# Patient Record
Sex: Male | Born: 1950
Health system: Southern US, Community
[De-identification: ages and names within clinical notes are randomized; demographics above are authoritative.]

## PROBLEM LIST (undated history)

## (undated) DIAGNOSIS — E349 Endocrine disorder, unspecified: Secondary | ICD-10-CM

## (undated) DIAGNOSIS — E669 Obesity, unspecified: Secondary | ICD-10-CM

## (undated) DIAGNOSIS — E291 Testicular hypofunction: Secondary | ICD-10-CM

## (undated) DIAGNOSIS — R7303 Prediabetes: Secondary | ICD-10-CM

## (undated) DIAGNOSIS — I517 Cardiomegaly: Secondary | ICD-10-CM

## (undated) DIAGNOSIS — I10 Essential (primary) hypertension: Secondary | ICD-10-CM

## (undated) DIAGNOSIS — K8020 Calculus of gallbladder without cholecystitis without obstruction: Secondary | ICD-10-CM

## (undated) DIAGNOSIS — E559 Vitamin D deficiency, unspecified: Secondary | ICD-10-CM

## (undated) DIAGNOSIS — E782 Mixed hyperlipidemia: Secondary | ICD-10-CM

## (undated) DIAGNOSIS — E079 Disorder of thyroid, unspecified: Secondary | ICD-10-CM

## (undated) DIAGNOSIS — K760 Fatty (change of) liver, not elsewhere classified: Secondary | ICD-10-CM

## (undated) DIAGNOSIS — K635 Polyp of colon: Secondary | ICD-10-CM

## (undated) DIAGNOSIS — K8689 Other specified diseases of pancreas: Secondary | ICD-10-CM

## (undated) DIAGNOSIS — F32A Depression, unspecified: Secondary | ICD-10-CM

## (undated) DIAGNOSIS — G4730 Sleep apnea, unspecified: Secondary | ICD-10-CM

## (undated) DIAGNOSIS — F329 Major depressive disorder, single episode, unspecified: Secondary | ICD-10-CM

## (undated) HISTORY — DX: Essential (primary) hypertension: I10

## (undated) HISTORY — DX: Sleep apnea, unspecified: G47.30

## (undated) HISTORY — DX: Mixed hyperlipidemia: E78.2

## (undated) HISTORY — DX: Cardiomegaly: I51.7

## (undated) HISTORY — DX: Depression, unspecified: F32.A

## (undated) HISTORY — DX: Disorder of thyroid, unspecified: E07.9

## (undated) HISTORY — DX: Testicular hypofunction: E29.1

## (undated) HISTORY — PX: COLONOSCOPY: SHX174

## (undated) HISTORY — DX: Fatty (change of) liver, not elsewhere classified: K76.0

## (undated) HISTORY — DX: Calculus of gallbladder without cholecystitis without obstruction: K80.20

## (undated) HISTORY — DX: Polyp of colon: K63.5

## (undated) HISTORY — DX: Vitamin D deficiency, unspecified: E55.9

## (undated) HISTORY — DX: Obesity, unspecified: E66.9

## (undated) HISTORY — PX: POLYPECTOMY: SHX149

## (undated) HISTORY — DX: Other specified diseases of pancreas: K86.89

## (undated) HISTORY — DX: Endocrine disorder, unspecified: E34.9

## (undated) HISTORY — DX: Major depressive disorder, single episode, unspecified: F32.9

## (undated) HISTORY — DX: Prediabetes: R73.03

---
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
## 1974-05-27 HISTORY — PX: CHOLECYSTECTOMY: SHX55

## 1984-05-27 HISTORY — PX: ANKLE FRACTURE SURGERY: SHX122

## 1994-05-27 HISTORY — PX: CARPAL TUNNEL RELEASE: SHX101

## 1998-04-06 ENCOUNTER — Ambulatory Visit (HOSPITAL_BASED_OUTPATIENT_CLINIC_OR_DEPARTMENT_OTHER): Admission: RE | Admit: 1998-04-06 | Discharge: 1998-04-06 | Payer: Self-pay | Admitting: Orthopedic Surgery

## 1998-04-14 ENCOUNTER — Other Ambulatory Visit: Admission: RE | Admit: 1998-04-14 | Discharge: 1998-04-14 | Payer: Self-pay | Admitting: Urology

## 1998-09-14 ENCOUNTER — Other Ambulatory Visit: Admission: RE | Admit: 1998-09-14 | Discharge: 1998-09-14 | Payer: Self-pay | Admitting: Urology

## 1999-04-06 ENCOUNTER — Ambulatory Visit (HOSPITAL_COMMUNITY): Admission: RE | Admit: 1999-04-06 | Discharge: 1999-04-06 | Payer: Self-pay | Admitting: *Deleted

## 1999-07-27 ENCOUNTER — Ambulatory Visit (HOSPITAL_BASED_OUTPATIENT_CLINIC_OR_DEPARTMENT_OTHER): Admission: RE | Admit: 1999-07-27 | Discharge: 1999-07-27 | Payer: Self-pay | Admitting: General Surgery

## 1999-07-27 ENCOUNTER — Encounter (INDEPENDENT_AMBULATORY_CARE_PROVIDER_SITE_OTHER): Payer: Self-pay

## 2000-01-08 ENCOUNTER — Ambulatory Visit (HOSPITAL_BASED_OUTPATIENT_CLINIC_OR_DEPARTMENT_OTHER): Admission: RE | Admit: 2000-01-08 | Discharge: 2000-01-08 | Payer: Self-pay | Admitting: Orthopedic Surgery

## 2000-02-20 ENCOUNTER — Ambulatory Visit (HOSPITAL_COMMUNITY): Admission: RE | Admit: 2000-02-20 | Discharge: 2000-02-20 | Payer: Self-pay | Admitting: *Deleted

## 2002-06-14 ENCOUNTER — Ambulatory Visit (HOSPITAL_COMMUNITY): Admission: RE | Admit: 2002-06-14 | Discharge: 2002-06-14 | Payer: Self-pay | Admitting: Internal Medicine

## 2002-06-14 ENCOUNTER — Encounter: Payer: Self-pay | Admitting: Internal Medicine

## 2003-06-14 ENCOUNTER — Ambulatory Visit (HOSPITAL_COMMUNITY): Admission: RE | Admit: 2003-06-14 | Discharge: 2003-06-14 | Payer: Self-pay | Admitting: Internal Medicine

## 2004-09-24 ENCOUNTER — Ambulatory Visit: Payer: Self-pay | Admitting: Internal Medicine

## 2005-07-02 ENCOUNTER — Ambulatory Visit: Payer: Self-pay | Admitting: Internal Medicine

## 2005-07-15 ENCOUNTER — Ambulatory Visit: Payer: Self-pay | Admitting: Internal Medicine

## 2005-07-15 ENCOUNTER — Encounter (INDEPENDENT_AMBULATORY_CARE_PROVIDER_SITE_OTHER): Payer: Self-pay | Admitting: *Deleted

## 2006-07-15 ENCOUNTER — Encounter: Admission: RE | Admit: 2006-07-15 | Discharge: 2006-10-13 | Payer: Self-pay | Admitting: Internal Medicine

## 2007-04-10 ENCOUNTER — Ambulatory Visit: Payer: Self-pay | Admitting: Internal Medicine

## 2009-06-12 ENCOUNTER — Encounter: Payer: Self-pay | Admitting: Cardiovascular Disease

## 2009-07-13 ENCOUNTER — Ambulatory Visit (HOSPITAL_COMMUNITY): Admission: RE | Admit: 2009-07-13 | Discharge: 2009-07-13 | Payer: Self-pay | Admitting: Internal Medicine

## 2009-07-13 ENCOUNTER — Encounter: Payer: Self-pay | Admitting: Cardiovascular Disease

## 2009-07-28 ENCOUNTER — Encounter: Payer: Self-pay | Admitting: Cardiovascular Disease

## 2009-08-01 DIAGNOSIS — I10 Essential (primary) hypertension: Secondary | ICD-10-CM | POA: Insufficient documentation

## 2009-08-01 DIAGNOSIS — G4733 Obstructive sleep apnea (adult) (pediatric): Secondary | ICD-10-CM

## 2009-08-03 DIAGNOSIS — E349 Endocrine disorder, unspecified: Secondary | ICD-10-CM

## 2009-08-03 HISTORY — DX: Endocrine disorder, unspecified: E34.9

## 2009-08-04 ENCOUNTER — Ambulatory Visit: Payer: Self-pay | Admitting: Cardiovascular Disease

## 2009-08-04 DIAGNOSIS — E782 Mixed hyperlipidemia: Secondary | ICD-10-CM

## 2009-08-21 ENCOUNTER — Inpatient Hospital Stay (HOSPITAL_COMMUNITY): Admission: EM | Admit: 2009-08-21 | Discharge: 2009-08-27 | Payer: Self-pay | Admitting: Emergency Medicine

## 2009-08-21 ENCOUNTER — Ambulatory Visit: Payer: Self-pay | Admitting: Cardiology

## 2009-08-21 ENCOUNTER — Ambulatory Visit: Payer: Self-pay | Admitting: Oncology

## 2009-08-22 ENCOUNTER — Encounter (INDEPENDENT_AMBULATORY_CARE_PROVIDER_SITE_OTHER): Payer: Self-pay | Admitting: Internal Medicine

## 2010-06-22 ENCOUNTER — Encounter: Payer: Self-pay | Admitting: Internal Medicine

## 2010-06-26 NOTE — Letter (Signed)
Summary: [facility] Adult & Adolescent Medicine Office Note  [facility] Adult & Adolescent Medicine Office Note   Imported By: Roderic Ovens 08/28/2009 11:57:02  _____________________________________________________________________  External Attachment:    Type:   Image     Comment:   External Document

## 2010-06-26 NOTE — Assessment & Plan Note (Signed)
Summary: np3/abn chest xray/mild LVF/jml   History of Present Illness: [name] [name] is seen today at the request of Dr Kathe Becton.  He has CRF of positive family history, HTN and elevated lipids  His last LDL was 64 and his BP has been under good contol.  He had a routine physical and CXR suggested cardiomegaly I reviewed it and thought heart size was within normal range with no other thoracic abnormalities.  He denies dyspnea, SSCP, palpitations or edema.  He has been compliant with his meds.  He had a normal myovue in 2005.  He enjoys riding his [employer] but is otherwise sedentary  Current Problems (verified): 1)  Hypertension  (ICD-401.9) 2)  Hypogonadism  (ICD-257.2) 3)  Sleep Apnea  (ICD-780.57) 4)  Obesity  (ICD-278.00)  Current Medications (verified): 1)  Enalapril Maleate 20 Mg Tabs (Enalapril Maleate) .Marland Kitchen.. 1 Tab By Mouth Once Daily 2)  Sertraline Hcl 100 Mg Tabs (Sertraline Hcl) .Marland Kitchen.. 1 Tab By Mouth Once Daily 3)  Crestor 5 Mg Tabs (Rosuvastatin Calcium) .... Mon, Wed, Fri 1 Tab 4)  Hydrochlorothiazide 25 Mg Tabs (Hydrochlorothiazide) .... Take One Tablet By Mouth Daily. 5)  Testosterone Shot .... Every 2 Weeks 6)  Multivitamins   Tabs (Multiple Vitamin) .Marland Kitchen.. 1 Tab By Mouth Once Daily 7)  Vitamin Dtabs (Cholecalciferol) .Marland Kitchen.. 1 Tab By Mouth Once Daily 8)  Aspirin 81 Mg  Tabs (Aspirin) .Marland Kitchen.. 1 Tab By Mouth Once Daily 9)  Niacin Cr 1500 Mg Cr-Caps (Niacin) .... Daily 10)  Fish Oil   Oil (Fish Oil) .Marland Kitchen.. 1 Tab By Mouth Once Daily 11)  Vitamin C 1000 Mg Tabs (Ascorbic Acid) .Marland Kitchen.. 1 Tab By Mouth  Once Daily  Allergies (verified): No Known Drug Allergies  Past History:  Past Medical History: Last updated: 08/01/2009 Current Problems:  HYPERTENSION (ICD-401.9) HYPOGONADISM (ICD-257.2) SLEEP APNEA (ICD-780.57) OBESITY (ICD-278.00)  Past Surgical History: Last updated: 08/01/2009   Release A1 pulley left ring finger.  Carpal tunnel release left hand.  Family History: Last updated:  08/01/2009 noncontributory  Review of Systems       Denies fever, malais, weight loss, blurry vision, decreased visual acuity, cough, sputum, SOB, hemoptysis, pleuritic pain, palpitaitons, heartburn, abdominal pain, melena, lower extremity edema, claudication, or rash.   Vital Signs:  Patient profile:   60 year old male Height:      66 inches Weight:      227 pounds BMI:     36.77 Pulse rate:   78 / minute Resp:     12 per minute BP sitting:   131 / 83  (left arm)  Vitals Entered By: Kem Parkinson (August 04, 2009 9:01 AM)  Physical Exam  General:  Affect appropriate Healthy:  appears stated age HEENT: normal Neck supple with no adenopathy JVP normal no bruits no thyromegaly Lungs clear with no wheezing and good diaphragmatic motion Heart:  S1/S2 no murmur,rub, gallop or click PMI normal Abdomen: benighn, BS positve, no tenderness, no AAA no bruit.  No HSM or HJR Distal pulses intact with no bruits No edema Neuro non-focal Skin warm and dry    Impression & Recommendations:  Problem # 1:  CARDIOMEGALY (ICD-429.3) Borderline by CXR.  F/U ech to assess LV size and LVH  Problem # 2:  MIXED HYPERLIPIDEMIA (ICD-272.2) Continue statin labs per Dr Steward Drone His updated medication list for this problem includes:    Crestor 5 Mg Tabs (Rosuvastatin calcium) ..... Mon, wed, fri 1 tab  Problem # 3:  HYPERTENSION (ICD-401.9) Well controlled  continue low sodium diet His updated medication list for this problem includes:    Enalapril Maleate 20 Mg Tabs (Enalapril maleate) .Marland Kitchen... 1 tab by mouth once daily    Hydrochlorothiazide 25 Mg Tabs (Hydrochlorothiazide) .Marland Kitchen... Take one tablet by mouth daily.    Aspirin 81 Mg Tabs (Aspirin) .Marland Kitchen... 1 tab by mouth once daily  Orders: Echocardiogram (Echo)  Patient Instructions: 1)  Your physician recommends that you schedule a follow-up appointment in: AS NEEDED PENDING TEST RESULTS 2)  Your physician has requested that you have an  echocardiogram.  Echocardiography is a painless test that uses sound waves to create images of your heart. It provides your doctor with information about the size and shape of your heart and how well your heart's chambers and valves are working.  This procedure takes approximately one hour. There are no restrictions for this procedure.   EKG Report  Procedure date:  07/13/2009  Findings:      NSR 63 Normal ECG No LVH

## 2010-06-26 NOTE — Letter (Signed)
Summary: [place] [facility] Ad[facility]se[facility] Office Note  [facility] Adult & Adolsecent Medicine Office Note   Imported By: Roderic Ovens 08/28/2009 11:56:14  _____________________________________________________________________  External Attachment:    Type:   Image     Comment:   External Document

## 2010-06-26 NOTE — Consult Note (Signed)
Summary: [place] [facility]  [facility]   Imported By: Earl Many 08/03/2009 17:47:39  _____________________________________________________________________  External Attachment:    Type:   Image     Comment:   External Document

## 2010-06-26 NOTE — Letter (Signed)
Summary: [place] [facility]  [facility]   Imported By: Earl Many 08/03/2009 17:49:30  _____________________________________________________________________  External Attachment:    Type:   Image     Comment:   External Document

## 2010-06-28 NOTE — Letter (Signed)
Summary: Colonoscopy Letter  [facility]  [address] [place], [place] [address]   Phone: [phone]  Fax: [phone]      June 22, 2010 MRN: [id number]   [name] [name] [address] [place], [place]  [address]   Dear [name] [name],   According to your medical record, it is time for you to schedule a Colonoscopy. The American Cancer Society recommends this procedure as a method to detect early colon cancer. Patients with a family history of colon cancer, or a personal history of colon polyps or inflammatory bowel disease are at increased risk.  This letter has been generated based on the recommendations made at the time of your procedure. If you feel that in your particular situation this may no longer apply, please contact our office.  Please call our office at [phone] to schedule this appointment or to update your records at your earliest convenience.  Thank you for cooperating with [place] to provide you with the very best care possible.   Sincerely,  Wilhemina Bonito. Marina Goodell, M.D.  [facility] [phone]

## 2010-07-26 ENCOUNTER — Encounter (INDEPENDENT_AMBULATORY_CARE_PROVIDER_SITE_OTHER): Payer: Self-pay | Admitting: *Deleted

## 2010-07-28 ENCOUNTER — Inpatient Hospital Stay (HOSPITAL_COMMUNITY)
Admission: EM | Admit: 2010-07-28 | Discharge: 2010-07-31 | DRG: 174 | Disposition: A | Discharge status: Home / Self Care | Payer: BC Managed Care – PPO | Attending: Internal Medicine | Admitting: Internal Medicine

## 2010-07-28 DIAGNOSIS — F3289 Other specified depressive episodes: Secondary | ICD-10-CM | POA: Diagnosis present

## 2010-07-28 DIAGNOSIS — D62 Acute posthemorrhagic anemia: Secondary | ICD-10-CM | POA: Diagnosis present

## 2010-07-28 DIAGNOSIS — E669 Obesity, unspecified: Secondary | ICD-10-CM | POA: Diagnosis present

## 2010-07-28 DIAGNOSIS — E785 Hyperlipidemia, unspecified: Secondary | ICD-10-CM | POA: Diagnosis present

## 2010-07-28 DIAGNOSIS — Z79899 Other long term (current) drug therapy: Secondary | ICD-10-CM

## 2010-07-28 DIAGNOSIS — G4733 Obstructive sleep apnea (adult) (pediatric): Secondary | ICD-10-CM | POA: Diagnosis present

## 2010-07-28 DIAGNOSIS — K5731 Diverticulosis of large intestine without perforation or abscess with bleeding: Principal | ICD-10-CM | POA: Diagnosis present

## 2010-07-28 DIAGNOSIS — Z22322 Carrier or suspected carrier of Methicillin resistant Staphylococcus aureus: Secondary | ICD-10-CM

## 2010-07-28 DIAGNOSIS — I4891 Unspecified atrial fibrillation: Secondary | ICD-10-CM | POA: Diagnosis present

## 2010-07-28 DIAGNOSIS — I10 Essential (primary) hypertension: Secondary | ICD-10-CM | POA: Diagnosis present

## 2010-07-28 DIAGNOSIS — Z7982 Long term (current) use of aspirin: Secondary | ICD-10-CM

## 2010-07-28 DIAGNOSIS — F329 Major depressive disorder, single episode, unspecified: Secondary | ICD-10-CM | POA: Diagnosis present

## 2010-07-28 LAB — DIFFERENTIAL
Eosinophils Absolute: 0.2 10*3/uL (ref 0.0–0.7)
Lymphocytes Relative: 12 % (ref 12–46)
Lymphs Abs: 1.5 10*3/uL (ref 0.7–4.0)
Neutrophils Relative %: 81 % — ABNORMAL HIGH (ref 43–77)

## 2010-07-28 LAB — CBC
HCT: 43.3 % (ref 39.0–52.0)
MCH: 30.9 pg (ref 26.0–34.0)
Platelets: 215 10*3/uL (ref 150–400)
WBC: 12.4 10*3/uL — ABNORMAL HIGH (ref 4.0–10.5)

## 2010-07-28 LAB — POCT I-STAT, CHEM 8
Chloride: 105 mEq/L (ref 96–112)
Glucose, Bld: 144 mg/dL — ABNORMAL HIGH (ref 70–99)
Hemoglobin: 15.3 g/dL (ref 13.0–17.0)
Sodium: 140 mEq/L (ref 135–145)
TCO2: 23 mmol/L (ref 0–100)

## 2010-07-28 LAB — PROTIME-INR
INR: 1.04 (ref 0.00–1.49)
Prothrombin Time: 13.8 seconds (ref 11.6–15.2)

## 2010-07-28 LAB — HEPATIC FUNCTION PANEL
ALT: 40 U/L (ref 0–53)
AST: 32 U/L (ref 0–37)
Albumin: 3.6 g/dL (ref 3.5–5.2)
Alkaline Phosphatase: 36 U/L — ABNORMAL LOW (ref 39–117)
Bilirubin, Direct: 0.1 mg/dL (ref 0.0–0.3)
Indirect Bilirubin: 0.2 mg/dL — ABNORMAL LOW (ref 0.3–0.9)

## 2010-07-28 LAB — URINALYSIS, ROUTINE W REFLEX MICROSCOPIC
Hgb urine dipstick: NEGATIVE
Ketones, ur: 15 mg/dL — AB
Nitrite: NEGATIVE
Protein, ur: NEGATIVE mg/dL
pH: 6 (ref 5.0–8.0)

## 2010-07-28 LAB — TYPE AND SCREEN

## 2010-07-28 LAB — APTT: aPTT: 25 seconds (ref 24–37)

## 2010-07-29 ENCOUNTER — Emergency Department (HOSPITAL_COMMUNITY): Payer: BC Managed Care – PPO

## 2010-07-29 DIAGNOSIS — K922 Gastrointestinal hemorrhage, unspecified: Secondary | ICD-10-CM

## 2010-07-29 DIAGNOSIS — K5730 Diverticulosis of large intestine without perforation or abscess without bleeding: Secondary | ICD-10-CM

## 2010-07-29 DIAGNOSIS — D62 Acute posthemorrhagic anemia: Secondary | ICD-10-CM

## 2010-07-29 DIAGNOSIS — I369 Nonrheumatic tricuspid valve disorder, unspecified: Secondary | ICD-10-CM

## 2010-07-29 LAB — COMPREHENSIVE METABOLIC PANEL
ALT: 32 U/L (ref 0–53)
Albumin: 3.5 g/dL (ref 3.5–5.2)
Alkaline Phosphatase: 29 U/L — ABNORMAL LOW (ref 39–117)
BUN: 14 mg/dL (ref 6–23)
Chloride: 109 mEq/L (ref 96–112)
Glucose, Bld: 119 mg/dL — ABNORMAL HIGH (ref 70–99)
Potassium: 4 mEq/L (ref 3.5–5.1)
Sodium: 145 mEq/L (ref 135–145)
Total Bilirubin: 0.6 mg/dL (ref 0.3–1.2)
Total Protein: 5.6 g/dL — ABNORMAL LOW (ref 6.0–8.3)

## 2010-07-29 LAB — DIFFERENTIAL
Basophils Relative: 0 % (ref 0–1)
Monocytes Absolute: 0.6 10*3/uL (ref 0.1–1.0)

## 2010-07-29 LAB — CK TOTAL AND CKMB (NOT AT ARMC)
Relative Index: 3.4 — ABNORMAL HIGH (ref 0.0–2.5)
Total CK: 253 U/L — ABNORMAL HIGH (ref 7–232)

## 2010-07-29 LAB — MAGNESIUM: Magnesium: 1.8 mg/dL (ref 1.5–2.5)

## 2010-07-29 LAB — PROTIME-INR
INR: 1.17 (ref 0.00–1.49)
Prothrombin Time: 15.1 seconds (ref 11.6–15.2)

## 2010-07-29 LAB — APTT: aPTT: 27 seconds (ref 24–37)

## 2010-07-29 LAB — CBC
HCT: 37.2 % — ABNORMAL LOW (ref 39.0–52.0)
Hemoglobin: 12.9 g/dL — ABNORMAL LOW (ref 13.0–17.0)
MCH: 30.7 pg (ref 26.0–34.0)
MCH: 29.8 pg (ref 26.0–34.0)
MCHC: 34.7 g/dL (ref 30.0–36.0)
MCV: 88.6 fL (ref 78.0–100.0)
MCV: 89.3 fL (ref 78.0–100.0)
Platelets: 185 10*3/uL (ref 150–400)
RBC: 3.82 MIL/uL — ABNORMAL LOW (ref 4.22–5.81)
RDW: 14.4 % (ref 11.5–15.5)
RDW: 14.5 % (ref 11.5–15.5)

## 2010-07-29 LAB — TROPONIN I: Troponin I: 0.03 ng/mL (ref 0.00–0.06)

## 2010-07-29 LAB — PHOSPHORUS: Phosphorus: 2.3 mg/dL (ref 2.3–4.6)

## 2010-07-29 LAB — ABO/RH: ABO/RH(D): A POS

## 2010-07-29 LAB — CARDIAC PANEL(CRET KIN+CKTOT+MB+TROPI)
CK, MB: 6.8 ng/mL (ref 0.3–4.0)
Relative Index: 2.7 — ABNORMAL HIGH (ref 0.0–2.5)
Total CK: 257 U/L — ABNORMAL HIGH (ref 7–232)
Total CK: 248 U/L — ABNORMAL HIGH (ref 7–232)
Troponin I: 0.02 ng/mL (ref 0.00–0.06)

## 2010-07-29 NOTE — H&P (Signed)
[name] [name], [name]:  [id number]  [name]:  [id number]           PATIENT TYPE:  E  LOCATION:  [facility]                         FACILITY:  [facility]  PHYSICIAN:  [name], MDDATE OF BIRTH:  March 06, 1951  DATE OF ADMISSION:  07/28/2010 DATE OF DISCHARGE:                             HISTORY & PHYSICAL   PRIMARY CARE PROVIDER:  [name], DO  CHIEF COMPLAINT:  Bloody bowel movements.  The patient is a 60 year old gentleman with past medical history of depression, hypertension, and diverticulosis.  The patient's last colonoscopy was 5 years ago.  He was supposed to have a colonoscopy done by [name] [name] in April.  The patient has been having recurrent bloody bowel movements all day today starting around 6:00 a.m.  At first, they were cherry blood red and then became more maroon.  Last bowel movement was about 3 hours ago. While he was having a bowel movement, he passed out and hit his head onthe wall and was brought into emergency department.  Since he had been in the ER, he has not had any more bowel movements.  He denies any chest pains or shortness of breath.  He has been otherwise doing well.  When he sits up or makes of a sudden movement, he does feel slightly lightheaded.  His hemoglobin has been stable and he may have been hemodynamically stable while in the emergency department.  He denies any nausea or vomiting.  He states that he has no abdominal discomfort of any sort.  No chest pain.  No shortness of breath.  No fevers.  No chills.  Otherwise, review of systems are negative.  PAST MEDICAL HISTORY:  Significant for: 1. Depression. 2. Hypercholesteremia. 3. Hypertension. 4. Small bowel obstruction. 5. Paroxysmal atrial fibrillation. 6. Diverticulosis. 7. Obstructive sleep apnea, on CPAP. 8. History of erythrocytosis in the past. 9. Dyslipidemia. 10.Obesity.  SOCIAL HISTORY:  The patient has not smoked for the past 35  years.  Does not drink or abuse drugs.  FAMILY HISTORY:  Significant for early onset coronary artery disease with his father dying from massive heart attack at age of 81.  ALLERGIES:  None.  MEDICATIONS: 1. Aspirin 325 mg daily. 2. Crestor 5 mg daily on Monday, Wednesday and Friday. 3. Metoprolol 25 mg twice a day. 4. Enalapril 20 mg daily. 5. Fish oil 1200 twice daily. 6. Multivitamins with vitamin D. 7. Sertraline 100 mg daily. 8. Vitamin B6, 50 mg over-the-counter. 9. Vitamin D 1000 units 6 tablets daily. 10.Vitamin C 500 mg 2 tablets daily.  PHYSICAL EXAMINATION:  VITAL SIGNS:  Temperature 98.6, blood pressure 102/79 up to 128/81, pulse 106, respirations 18, and saturating 97% room air. GENERAL:  The patient appears to be currently in no acute distress. HEENT:  Head showing ecchymosis over left eye. HEART:  Regular rate and rhythm.  No murmurs appreciated. LUNGS:  Clear to auscultation bilaterally. ABDOMEN:  Soft, nontender, and nondistended.  Good bowel movement throughout. EXTREMITIES:  Lower extremities without clubbing, cyanosis, or edema. NEUROLOGIC:  Grossly intact. SKIN:  Clean, dry, and intact.  LABORATORY DATA:  White blood  cell count 12.4, hemoglobin 15.3.  Sodium 140, potassium 4.1, creatinine 1.0.  UA unremarkable.  INR 1.04.  I do not see an EKG on the chart.  RADIOLOGICAL STUDIES:  He has not had any.  ASSESSMENT AND PLAN:  This is a 61 year old gentleman. 1. Gastrointestinal bleed.  This is likely lower gastrointestinal     bleed.  We will admit to Step-Down for observation.  We will do     serial CBC.  Discussed with Dr. Leone Payor who will see him in a.m.     We will put him on Protonix, give aggressive IV resuscitation,     watch vitals carefully. 2. Syncope.  Possibly vasovagal syncope, although given his past     history of hypertension and hypercholesteremia, we will cycle     cardiac enzymes.  Check 2-D echo and obtain EKG just to be on      complete safe side. 3. Head injury.  We will obtain CT scan of the head to rule out bleed     or occult fracture. 4. Prophylaxis.  Protonix and SCDs. 5. Hypertension.  We will only continue metoprolol low dose. 6. Paroxysmal atrial fibrillation, for which he was on full-dose     aspirin.  For now, we will hold and continue metoprolol.  Right     now, he is in sinus rhythm.     [name], MD    AVD/MEDQ  D:  07/29/2010  T:  07/29/2010  Job:  [id number]  cc:   [name] [name], D.O.  Electronically Signed by [name] Doyne MD on 07/29/2010 10:13:00 PM

## 2010-07-30 ENCOUNTER — Encounter: Payer: Self-pay | Admitting: Gastroenterology

## 2010-07-30 LAB — CBC
MCHC: 33.8 g/dL (ref 30.0–36.0)
Platelets: 173 10*3/uL (ref 150–400)
RDW: 14.5 % (ref 11.5–15.5)
WBC: 5.5 10*3/uL (ref 4.0–10.5)

## 2010-07-31 DIAGNOSIS — D62 Acute posthemorrhagic anemia: Secondary | ICD-10-CM

## 2010-07-31 DIAGNOSIS — K5731 Diverticulosis of large intestine without perforation or abscess with bleeding: Secondary | ICD-10-CM

## 2010-07-31 LAB — RENAL FUNCTION PANEL
Albumin: 3.3 g/dL — ABNORMAL LOW (ref 3.5–5.2)
CO2: 31 mEq/L (ref 19–32)
Chloride: 106 mEq/L (ref 96–112)
Creatinine, Ser: 0.84 mg/dL (ref 0.4–1.5)
GFR calc Af Amer: >60 mL/min (ref >60)
GFR calc non Af Amer: >60 mL/min (ref >60)
Potassium: 4.2 mEq/L (ref 3.5–5.1)
Sodium: 140 mEq/L (ref 135–145)

## 2010-07-31 LAB — CBC
Hemoglobin: 11.2 g/dL — ABNORMAL LOW (ref 13.0–17.0)
MCH: 30.1 pg (ref 26.0–34.0)
Platelets: 173 10*3/uL (ref 150–400)
RBC: 3.72 MIL/uL — ABNORMAL LOW (ref 4.22–5.81)
WBC: 5 10*3/uL (ref 4.0–10.5)

## 2010-08-02 NOTE — Letter (Signed)
Summary: Pre Visit Letter Revised  [facility]  [address] [place], [place] [address]   Phone: [phone]  Fax: [phone]        07/26/2010 MRN: [id number] [name] [name] [address] [place], [place]  [address]             Procedure Date:  09/07/2010 @ 10:00   Recall colon-Dr. Marina Goodell   Welcome to the Gastroenterology Division at [facility].    You are scheduled to see a nurse for your pre-procedure visit on 08/17/2010 at 4:30 on the 3rd floor at [facility], [address]  We ask that you try to arrive at our office 15 minutes prior to your appointment time to allow for check-in.  Please take a minute to review the attached form.  If you answer "Yes" to one or more of the questions on the first page, we ask that you call the person listed at your earliest opportunity.  If you answer "No" to all of the questions, please complete the rest of the form and bring it to your appointment.    Your nurse visit will consist of discussing your medical and surgical history, your immediate family medical history, and your medications.   If you are unable to list all of your medications on the form, please bring the medication bottles to your appointment and we will list them.  We will need to be aware of both prescribed and over the counter drugs.  We will need to know exact dosage information as well.    Please be prepared to read and sign documents such as consent forms, a financial agreement, and acknowledgement forms.  If necessary, and with your consent, a friend or relative is welcome to sit-in on the nurse visit with you.  Please bring your insurance card so that we may make a copy of it.  If your insurance requires a referral to see a specialist, please bring your referral form from your primary care physician.  No co-pay is required for this nurse visit.     If you cannot keep your appointment, please call [phone] to cancel or reschedule prior  to your appointment date.  This allows [place] the opportunity to schedule an appointment for another patient in need of care.    Thank you for choosing [facility] for your medical needs.  We appreciate the opportunity to care for you.  Please visit [place] at our website  to learn more about our practice.  Sincerely, The Gastroenterology Division

## 2010-08-03 NOTE — Consult Note (Signed)
[name] [name], EDGELL NO.:  [id number]  [name]:  [id number]           PATIENT TYPE:  I  LOCATION:  2501                         FACILITY:  [facility]  PHYSICIAN:  Iva Boop, MD,FACGDATE OF BIRTH:  1951/01/20  DATE OF CONSULTATION:  07/29/2010 DATE OF DISCHARGE:                                CONSULTATION   REASON FOR CONSULTATION:  GI bleed.  REQUESTING PHYSICIAN:  [name], MD  PRIMARY CARE PHYSICIAN:  [name], DO  GASTROENTEROLOGIST.:  Wilhemina Bonito. Marina Goodell, MD  ASSESSMENT:  A 60 year old white man with an approximately 24-hour history of painless hematochezia.  Hemoglobin on presentation was 15, most recent was 12.9, continues with active bleeding, but seems to have slowed down with normally two episodes today and 5-6 yesterday.  Situation was complicated by syncope and head trauma with negative head CT.  He has periorbital ecchymosis.  There is a prior history of colon polyps, pathology shows hyperplastic polyp in 2007.  It sounds like he was preparing for a repeat colonoscopy with Dr. Marina Goodell, his primary gastroenterologist.  Risk factors for bleeding include aspirin use, and sertraline use.  RECOMMENDATIONS AND PLAN:  We will prep and proceed the colonoscopy, Dr. Melvia Heaps, will perform tomorrow.  I suspect given that he also has a history of diverticulosis, this is a GI bleed and colonic diverticulosis.  There has been no melena and his BUN is normal, I do not think there is any upper bleed.  Risks, benefits, and indications are explained.  He understands and agrees to proceed.  HISTORY:  A 60 year old white man with problems as outlined above.  He was in his usual state of health with no bowel habit changes or abdominal pain.  Yesterday, he developed onset of painless bright red blood per rectum passing moderate to large amounts throughout the day. He is waiting to see if it was stopped.  During one of the  episodes in the evening, he was on the toilet and he relatively suddenly passed out. He does not remember when he struck his head.  He was evaluated in the emergency room eventually, and found to have a left periorbital hematoma, he had sinus changes in the right maxillary sinus and air fluid level, left periorbital soft tissue swelling, but no other significant findings of the CT.  He was admitted to the floor where he has had two episodes of passage of blood today.  He has had no pain or other problems.  He denies antecedent bleeding, he denies dysphagia, heartburn, nausea, vomiting.  He had not been using any extra antiinflammatories, etc.  He uses an aspirin daily, though he stops that at times because he is a platelet donor.  PAST MEDICAL HISTORY: 1. Depression. 2. Sleep apnea on CPAP. 3. Dyslipidemia. 4. Hypertension. 5. Paroxysmal atrial fibrillation. 6. Diverticulosis. 7. Erythrocytosis. 8. Obesity. 9. Small-bowel obstruction, treated conservatively April 2011.  SURGICAL HISTORY: 1. Open cholecystectomy and appendectomy. 2. Exploratory laparotomy in the past. 3. Carpel tunnel release. 4. Left ankle surgery. 5. Ganglion cyst. 6. Angiolipoma, excision.  SOCIAL HISTORY:  The patient married.  He  stays with his wife.  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  He has not been a smoker for 35 years.  No alcohol or drug abuse.  FAMILY HISTORY:  No colon cancer, early coronary artery disease.  His father died in age 60 with myocardial infarction.  DRUG ALLERGIES:  None.  HOME MEDICATIONS: 1. Aspirin 325 mg daily. 2. Crestor 5 mg Monday, Wednesday, and Friday. 3. Metoprolol 25 mg b.i.d. 4. Enalapril 20 mg daily. 5. Fish oil 1200 mg twice daily. 6. Multivitamins and vitamin D. 7. Sertraline 100 mg daily. 8. Vitamin B6 50 mg over the counter. 9. Vitamin D 1000 units 6 tablets daily. 10.Vitamin C 500 mg daily.  REVIEW OF SYSTEMS:  Notable for the  periorbital hematoma.  He uses CPAP for sleep assistance with his sleep apnea.  He denies any chest pain or other respiratory difficulty.  Entire 10-point review of systems is otherwise negative.  PHYSICAL EXAMINATION:  GENERAL:  Reveals a well-developed, well- nourished obese white man in no acute distress. VITAL SIGNS:  Temperature 98, blood pressure 123/85, pulse 94 and regular, respirations 15. HEENT:  The eyes are anicteric.  He has a moderate periorbital hematoma on the left and the mouth and posterior pharynx are clear. NECK:  Supple without mass or thyromegaly. LUNGS:  Clear. HEART:  S1 and S2.  No rubs or gallops.  Regular rhythm and rate.  No jugular venous distention. ABDOMEN:  Obese, soft, and nontender with bowel sounds present, just slightly increased and multiple surgical scars in right upper quadrant and midline.  There is no inguinal adenopathy.  No supraclavicular or cervical adenopathy. EXTREMITIES:  The lower extremities are free of edema. NEUROLOGIC:  He is awake and alert and oriented x3.  Mood and affect are appropriate.  LABORATORY DATA:  Hemoglobin as mentioned above.  Troponin is negative. He has a mildly elevated CPK and CK-MB, phosphorous is 2.3, magnesium 1.8, glucose 119.  Electrolytes otherwise normal.  LFTs normal.  Coags normal.  His MRSA positive.  White count 9.2, platelets 193.  UA negative except for 15 mg/dL ketones.  EKG sinus rhythm.  Imaging as above with respect to his CT scan.     Iva Boop, MD,FACG     CEG/MEDQ  D:  07/29/2010  T:  07/30/2010  Job:  [id number]  cc:   [name], D.O.  Electronically Signed by Stan Head MDFACG on 08/03/2010 08:06:25 AM

## 2010-08-07 NOTE — Procedures (Signed)
Summary: Colonoscopy  Patient: Laurens Matheny Note: All result statuses are Final unless otherwise noted.  Tests: (1) Colonoscopy (COL)   COL Colonoscopy           DONE     [facility]     [address]     [place], [place]  [address]          COLONOSCOPY PROCEDURE REPORT          PATIENT:  Samy, Ryner  MR#:  [id number]     BIRTHDATE:  09/27/1950, 59 yrs. old  GENDER:  male     ENDOSCOPIST:  Barbette Hair. Arlyce Dice, MD     REF. BY:  Marisue Brooklyn, D.O.     PROCEDURE DATE:  07/30/2010     PROCEDURE:  Diagnostic Colonoscopy     ASA CLASS:  Class II     INDICATIONS:  hematochezia     MEDICATIONS:   Fentanyl 100 mcg IV, Versed 10 mg IV          DESCRIPTION OF PROCEDURE:   After the risks benefits and     alternatives of the procedure were thoroughly explained, informed     consent was obtained.  Digital rectal exam was performed and     revealed no abnormalities.   The EC-[id number] ([id number]) endoscope was     introduced through the anus and advanced to the cecum, which was     identified by both the appendix and ileocecal valve, without     limitations.  The quality of the prep was excellent, using Colyte.     The instrument was then slowly withdrawn as the colon was fully     examined.     <<PROCEDUREIMAGES>>          FINDINGS:  Moderate diverticulosis was found in the sigmoid colon.     Short cluster of moderate diverticula mid sigmoid. No fresh or old     blood (see image001).  This was otherwise a normal examination of     the colon (see image002, image003, image004, image005, image008,     and image011).   Retroflexed views in the rectum revealed no     abnormalities.    The scope was then withdrawn from the patient     and the procedure completed.          COMPLICATIONS:  None     ENDOSCOPIC IMPRESSION:     1) Moderate diverticulosis in the sigmoid colon     2) Otherwise normal examination          GI bleed secondary to diverticula       RECOMMENDATIONS:     1) Return to the care of your primary provider. GI follow up as     needed     REPEAT EXAM:  No          ______________________________     Barbette Hair. Arlyce Dice, MD          CC:          n.     eSIGNED:   Barbette Hair. Kaplan at 07/30/2010 02:26 PM          Joylene John, [phone]  Note: An exclamation mark (!) indicates a result that was not dispersed into the flowsheet. Document Creation Date: 07/30/2010 2:27 PM _______________________________________________________________________  (1) Order result status: Final Collection or observation date-time: 07/30/2010 14:21 Requested date-time:  Receipt date-time:  Reported date-time:  Referring Physician:   Ordering Physician: Molly Maduro  Arlyce Dice [phone]) Specimen Source:  Source: Launa Grill Order Number: [id number] Lab site:

## 2010-08-15 LAB — BASIC METABOLIC PANEL
BUN: 14 mg/dL (ref 6–23)
BUN: 5 mg/dL — ABNORMAL LOW (ref 6–23)
BUN: 8 mg/dL (ref 6–23)
CO2: 26 mEq/L (ref 19–32)
CO2: 26 mEq/L (ref 19–32)
Chloride: 104 mEq/L (ref 96–112)
Chloride: 108 mEq/L (ref 96–112)
Creatinine, Ser: 0.77 mg/dL (ref 0.4–1.5)
GFR calc Af Amer: >60 mL/min (ref >60)
GFR calc Af Amer: >60 mL/min (ref >60)
GFR calc non Af Amer: >60 mL/min (ref >60)
Potassium: 3.7 mEq/L (ref 3.5–5.1)
Potassium: 4.1 mEq/L (ref 3.5–5.1)

## 2010-08-15 LAB — CBC
HCT: 43.5 % (ref 39.0–52.0)
HCT: 45.1 % (ref 39.0–52.0)
MCHC: 34.1 g/dL (ref 30.0–36.0)
MCV: 93.4 fL (ref 78.0–100.0)
Platelets: 142 10*3/uL — ABNORMAL LOW (ref 150–400)
RBC: 4.65 MIL/uL (ref 4.22–5.81)
RBC: 4.82 MIL/uL (ref 4.22–5.81)
WBC: 4.9 10*3/uL (ref 4.0–10.5)
WBC: 6.7 10*3/uL (ref 4.0–10.5)

## 2010-08-16 NOTE — Discharge Summary (Signed)
NAMETALYN, DESSERT NO.:  [id number]  [name]:  [id number]           PATIENT TYPE:  I  LOCATION:  2501                         FACILITY:  [facility]  PHYSICIAN:  Marinda Elk, M.D.DATE OF BIRTH:  02/17/1951  DATE OF ADMISSION:  07/28/2010 DATE OF DISCHARGE:  07/31/2010                              DISCHARGE SUMMARY   ADMITTING PHYSICIAN:  Dr. Adela Glimpse with [facility].  DISCHARGING PHYSICIAN:  Dr.  Marinda Elk with [facility].  CONSULTANTS AT THIS ADMISSION:  Dr. Leone Payor with GI.  CHIEF COMPLAINT/REASON FOR ADMISSION:  Mr. Simkins is a pleasant 60 year old male patient, known history of depression, hypertension, and prior diverticulosis, who actually was scheduled to undergo screening colonoscopy in April of this year.  He presented to the hospital because of having bright red blood stools continuously since 6 a.m.  Midway to the day, the stools seemed to decrease in frequency, but later on they increased and what having a bowel movement.  He had an apparent syncopal episode and hit his head on the wall, sustaining a black eye on the left side.  In the ER, he was found to have a white cell count of 12,400, hemoglobin 15.3.  He had a relative hypotension with his blood pressure of 102/79.  He was tachycardic with a heart rate of 106, does not appear that orthostatic vital signs were checked in the ER.  Because of his symptomatology, the hospitalist were called to evaluate the patient for admission.  On their clinical exam, the patient was afebrile.  His blood pressure was now up to 128/81.  He was still tachycardic with heart rates in the 100s, respirations 18 and he was maintaining, O2 saturations of 97%.  His physical exam was unremarkable, except for an evolving black eye in the left periocular area, otherwise, again exam unremarkable.  Laboratory data again revealed slight leukocytosis with a hemoglobin of 15.3.  Sodium  140, potassium 5.1, creatinine 1.  INR 1.04. Urinalysis was within normal limits.  PAST MEDICAL HISTORY: 1. Depression. 2. Dyslipidemia. 3. Hypertension. 4. Prior small bowel obstruction. 5. Paroxysmal atrial fibrillation, maintaining sinus rhythm. 6. Diverticulosis. 7. Obstructive sleep apnea, on CPAP. 8. Prior erythrocytosis. 9. Dyslipidemia. 10.Obesity.  ADMITTING DIAGNOSES: 1. Acute gastrointestinal bleed most likely lower gastrointestinal     tract etiology. 2. Syncope, etiology unclear, vasovagal versus evolving anemic     process. 3. Mild traumatic injury to left eye, rule out acute brain injury. 4. Hypertension, currently with a relative hypotension. 5. Paroxysmal atrial fibrillation, maintaining sinus rhythm.  DIAGNOSTICS:  CT of the head without contrast on March 1 that shows no acute intracranial abnormalities.  Thus, appears to be sinus disease with an air-fluid level noted in the right maxillary sinus, this was opposite the eye that sustained the traumatic injury.  LABORATORY:  PTT 25, PT 13.8, and INR 1.04 at admission.  His initial CBC showed a hemoglobin of 15.1 and hematocrit of 43.3, platelet count 215,000, neutrophils 81%.  Liver function panel was obtained and was normal.  His cardiac isoenzymes were cycled x3.  He did have mild elevation  in CPK, MB, and relative index, but his troponins remained normal.  Followup CBC 24 hours after admission, hemoglobin down to 12.9, and most recent CBC on date of admission, hemoglobin 11.2, hematocrit 33.2, and platelets 173,000.  Please note, the patient's baseline hemoglobin is 14.8 and hematocrit is 43.4 as of December 2011.  TSH was 3.223.  On date of discharge, sodium 140, potassium 4.2, chloride 106, CO2 31, glucose 101, BUN 7, creatinine 0.84.  PROCEDURES:  Colonoscopy on March 5 by Dr. Melvia Heaps shows moderate diverticulosis in the sigmoid colon with a short cluster of moderate diverticulum in the colon, no  fresh or old blood, otherwise normal examination.  HOSPITAL COURSE: 1. Acute blood loss anemia secondary to diverticular bleed.  The     patient presented with symptoms consistent of lower GI bleeding.     The patient has underlying erythrocytosis/polycythemia with     baseline hemoglobin of 14.  The patient's hemoglobin did drop to     11.1 and had a relative hypotension, which has subsequently     rebounded.  He did not receive any blood transfusions at this     admission.  He had GI consultation and subsequently underwent     colonoscopy, which shows evidence of diverticular disease in the     sigmoid colon without acute bleeding.  The patient is normally on     aspirin for prophylaxis due to known paroxysmal atrial     fibrillation.  Dr. Arlyce Dice advised that we may continue this     medication since he is not actively bleeding.  The patient also     received dietary information regarding diverticulosis this     admission.  He has also been advised at least for the short-term to     avoid other NSAID type medicines due to recent bleeding. 2. Syncope.  The patient presented after a syncopal episode while    having a bowel movement, sustaining an injury to the left eye. This     was manifested as soft tissue ecchymosis.  CT of the head showed no     evidence of intracranial trauma.  The patient has not had any     neurological deficits.  The patient had an echocardiogram that was     normal.  EKG was done that shows normal sinus rhythm.  No ischemic     changes.  Cardiac isoenzymes were negative.  Still, at this time,     the patient had syncope related to associated acute blood loss     anemia with orthostasis in the setting of the patient normally     takes 2 antihypertensive medications. 3. Hypertension.  The patient presented with a relative hypotension.     Therefore, his usual antihypertensive medicines were held including     Lopressor.  He was placed on p.r.n. Lopressor in any  event he     developed slight tachycardia.  After his colonoscopy on March 5, he     was able to resume the Lopressor, and as of this morning date of     discharge, his blood pressure still remained stable at 148/82, so     we will resume his lisinopril.  DISPOSITION:  At the present time, the patient is appropriate to discharge home with an accompaniment of his wife.  FINAL DISCHARGE MEDICATIONS: 1. Tylenol 325 mg every 4 hours as needed for pain. 2. Mupirocin 2% ointment intranasally b.i.d. for 3 more days, this has  been placed to cover MRSA and PCR positive status. 3. Enteric-coated aspirin 325 mg daily. 4. Crestor 5 mg every Monday, Wednesday, Friday. 5. Enalapril 20 mg daily. 6. Fish oil 1200 OTC b.i.d. 7. Multiple vitamins with vitamin D daily. 8. Metoprolol 25 mg b.i.d. 9. Sertraline 100 mg daily. 10.Vitamin B6 50 mg over-the-counter daily. 11.Vitamin C 500 mg over-the-counter daily 2 tablets. 12.Vitamin D 8000 units 6 tablets daily.  FINAL DISCHARGE DIAGNOSES: 1. Acute blood loss anemia secondary to diverticular disease,     resolving. 2. Syncope secondary to hypotension related to recent acute blood loss     anemia. 3. Hypertension, currently controlled. 4. Paroxysmal atrial fibrillation, maintaining sinus rhythm, on     aspirin. 5. Baseline primary erythrocytosis with a hemoglobin of 14.3.  OTHER DISCHARGE INSTRUCTIONS:  The patient may return to work on Monday, March 12, or sooner if able.  ACTIVITY:  Increase activity slowly.  May walk up steps.  May shower or bathe.  DIET:  Low-sodium heart-healthy or as prior to admission.  FOLLOWUP APPOINTMENTS: 1. Please contact Dr. Elisabeth Most at [phone] to be seen in 1-2 weeks. 2. Please call Dr. Arlyce Dice with Gastroenterology if you have recurrent     GI bleeding, telephone number [phone].  Last recommendation from     GI was to see as needed.  ADDITIONAL INSTRUCTIONS:  No NSAIDs for now, this includes  Aleve, ibuprofen, Motrin, etc.     Revonda Standard L. Rennis Harding, N.P.   ______________________________ Marinda Elk, M.D.    ALE/MEDQ  D:  07/31/2010  T:  08/01/2010  Job:  [id number]  cc:   [name], D.O. Barbette Hair. Arlyce Dice, MD,FACG  Electronically Signed by Junious Silk N.P. on 08/01/2010 12:58:42 PM Electronically Signed by Lambert Keto M.D. on 08/16/2010 07:56:40 AM

## 2010-08-20 LAB — CBC
HCT: 46.5 % (ref 39.0–52.0)
HCT: 53.2 % — ABNORMAL HIGH (ref 39.0–52.0)
HCT: 59.9 % — ABNORMAL HIGH (ref 39.0–52.0)
Hemoglobin: 16.7 g/dL (ref 13.0–17.0)
Hemoglobin: 20.5 g/dL — ABNORMAL HIGH (ref 13.0–17.0)
MCHC: 33.9 g/dL (ref 30.0–36.0)
MCHC: 34.1 g/dL (ref 30.0–36.0)
MCHC: 34.2 g/dL (ref 30.0–36.0)
MCHC: 34.3 g/dL (ref 30.0–36.0)
MCHC: 34.9 g/dL (ref 30.0–36.0)
MCV: 92.9 fL (ref 78.0–100.0)
MCV: 93.3 fL (ref 78.0–100.0)
MCV: 94 fL (ref 78.0–100.0)
Platelets: 164 10*3/uL (ref 150–400)
RBC: 4.95 MIL/uL (ref 4.22–5.81)
RBC: 5.24 MIL/uL (ref 4.22–5.81)
RBC: 5.72 MIL/uL (ref 4.22–5.81)
RDW: 14.2 % (ref 11.5–15.5)
RDW: 14.2 % (ref 11.5–15.5)
RDW: 14.5 % (ref 11.5–15.5)
WBC: 4.5 10*3/uL (ref 4.0–10.5)

## 2010-08-20 LAB — DIFFERENTIAL
Basophils Absolute: 0 10*3/uL (ref 0.0–0.1)
Basophils Absolute: 0 10*3/uL (ref 0.0–0.1)
Basophils Relative: 0 % (ref 0–1)
Basophils Relative: 0 % (ref 0–1)
Basophils Relative: 0 % (ref 0–1)
Eosinophils Absolute: 0 10*3/uL (ref 0.0–0.7)
Eosinophils Absolute: 0.3 10*3/uL (ref 0.0–0.7)
Eosinophils Relative: 0 % (ref 0–5)
Eosinophils Relative: 4 % (ref 0–5)
Lymphocytes Relative: 14 % (ref 12–46)
Lymphs Abs: 0.7 10*3/uL (ref 0.7–4.0)
Monocytes Absolute: 0.9 10*3/uL (ref 0.1–1.0)
Monocytes Absolute: 1.1 10*3/uL — ABNORMAL HIGH (ref 0.1–1.0)
Monocytes Relative: 16 % — ABNORMAL HIGH (ref 3–12)
Monocytes Relative: 6 % (ref 3–12)
Neutro Abs: 19.4 10*3/uL — ABNORMAL HIGH (ref 1.7–7.7)
Neutro Abs: 2.8 10*3/uL (ref 1.7–7.7)
Neutrophils Relative %: 62 % (ref 43–77)
Neutrophils Relative %: 71 % (ref 43–77)
Neutrophils Relative %: 89 % — ABNORMAL HIGH (ref 43–77)
Neutrophils Relative %: 90 % — ABNORMAL HIGH (ref 43–77)

## 2010-08-20 LAB — COMPREHENSIVE METABOLIC PANEL
ALT: 46 U/L (ref 0–53)
Albumin: 4.2 g/dL (ref 3.5–5.2)
Alkaline Phosphatase: 33 U/L — ABNORMAL LOW (ref 39–117)
BUN: 11 mg/dL (ref 6–23)
BUN: 29 mg/dL — ABNORMAL HIGH (ref 6–23)
CO2: 31 mEq/L (ref 19–32)
Calcium: 8.6 mg/dL (ref 8.4–10.5)
Chloride: 102 mEq/L (ref 96–112)
Chloride: 99 mEq/L (ref 96–112)
Creatinine, Ser: 0.98 mg/dL (ref 0.4–1.5)
GFR calc non Af Amer: >60 mL/min (ref >60)
Glucose, Bld: 119 mg/dL — ABNORMAL HIGH (ref 70–99)
Potassium: 3.8 mEq/L (ref 3.5–5.1)
Sodium: 136 mEq/L (ref 135–145)
Total Bilirubin: 1.2 mg/dL (ref 0.3–1.2)
Total Bilirubin: 1.2 mg/dL (ref 0.3–1.2)

## 2010-08-20 LAB — HEPARIN LEVEL (UNFRACTIONATED)
Heparin Unfractionated: 0.14 IU/mL — ABNORMAL LOW (ref 0.30–0.70)
Heparin Unfractionated: 0.18 IU/mL — ABNORMAL LOW (ref 0.30–0.70)
Heparin Unfractionated: 0.19 IU/mL — ABNORMAL LOW (ref 0.30–0.70)

## 2010-08-20 LAB — GIARDIA/CRYPTOSPORIDIUM SCREEN(EIA)
Cryptosporidium Screen (EIA): NEGATIVE
Giardia Screen - EIA: NEGATIVE

## 2010-08-20 LAB — JAK2 GENOTYPR: JAK2 GenotypR: NOT DETECTED

## 2010-08-20 LAB — BASIC METABOLIC PANEL
BUN: 17 mg/dL (ref 6–23)
BUN: 29 mg/dL — ABNORMAL HIGH (ref 6–23)
CO2: 26 mEq/L (ref 19–32)
CO2: 27 mEq/L (ref 19–32)
CO2: 31 mEq/L (ref 19–32)
CO2: 31 mEq/L (ref 19–32)
Calcium: 10.4 mg/dL (ref 8.4–10.5)
Calcium: 10.5 mg/dL (ref 8.4–10.5)
Calcium: 8.4 mg/dL (ref 8.4–10.5)
Chloride: 101 mEq/L (ref 96–112)
Chloride: 97 mEq/L (ref 96–112)
Creatinine, Ser: 0.99 mg/dL (ref 0.4–1.5)
Creatinine, Ser: 1.55 mg/dL — ABNORMAL HIGH (ref 0.4–1.5)
GFR calc Af Amer: >60 mL/min (ref >60)
GFR calc non Af Amer: >60 mL/min (ref >60)
Glucose, Bld: 101 mg/dL — ABNORMAL HIGH (ref 70–99)
Glucose, Bld: 113 mg/dL — ABNORMAL HIGH (ref 70–99)
Glucose, Bld: 142 mg/dL — ABNORMAL HIGH (ref 70–99)
Glucose, Bld: 175 mg/dL — ABNORMAL HIGH (ref 70–99)
Potassium: 3.5 mEq/L (ref 3.5–5.1)
Sodium: 135 mEq/L (ref 135–145)
Sodium: 138 mEq/L (ref 135–145)

## 2010-08-20 LAB — CK TOTAL AND CKMB (NOT AT ARMC)
CK, MB: 12.6 ng/mL (ref 0.3–4.0)
CK, MB: 14.9 ng/mL (ref 0.3–4.0)
Relative Index: 1.6 (ref 0.0–2.5)
Relative Index: 1.7 (ref 0.0–2.5)
Total CK: 904 U/L — ABNORMAL HIGH (ref 7–232)

## 2010-08-20 LAB — RETICULOCYTES: Retic Ct Pct: 1.4 % (ref 0.4–3.1)

## 2010-08-20 LAB — TROPONIN I: Troponin I: 0.04 ng/mL (ref 0.00–0.06)

## 2010-08-20 LAB — STOOL CULTURE

## 2010-08-20 LAB — POCT CARDIAC MARKERS: CKMB, poc: 2.4 ng/mL (ref 1.0–8.0)

## 2010-08-20 LAB — SEDIMENTATION RATE: Sed Rate: 1 mm/hr (ref 0–16)

## 2010-09-07 ENCOUNTER — Other Ambulatory Visit: Payer: Self-pay | Admitting: Internal Medicine

## 2010-10-09 NOTE — Assessment & Plan Note (Signed)
[facility]                             PULMONARY OFFICE NOTE   NAME:[name], [name] INCLAN                       MRN:          [id number]  DATE:04/10/2007                            DOB:          26-May-1951    PROBLEM:  1. Obstructive sleep apnea.  2. Hypertension.   HISTORY:  He has been stable for a long time but needs replacement CPAP  mask.  He says his CPAP machine is my most prized possession.  Pressure remains set at 12.5 through [facility].  He has been able to lose  some weight, but has not noted problems with mask fit or pressure  setting.   MEDICATIONS:  1. CPAP 12.5.  2. Enalapril 20 mg.  3. Lipitor 10 mg.  4. Hydrochlorothiazide 12.5 mg.  5. Vitamins.  6. Niacin.  7. Zoloft 50 mg.   ALLERGIES:  No known drug allergies.   OBJECTIVE:  VITAL SIGNS:  Weight 227 pounds compared with 248 pounds in  2006, blood pressure 116/68, pulse 79, room air saturation 98%.  GENERAL:  He is alert.  There are no pressure marks on his face from the  mask.  Nasal airway is not congested.  LUNGS:  Clear.  HEART:  Sounds normal.   IMPRESSION:  Obstructive sleep apnea under good control.  I am very  pleased with the weight loss which will also help his blood pressure.  Treatment options and updates were reviewed.   PLAN:  Replacement CPAP mask and supplies. Continue pressure at 12.5.  Continue efforts to lose weight.  Maintain good sleep hygiene.  Stay  alert while driving.  Schedule return in one year, earlier p.r.n.     Clinton D. Maple Hudson, MD, Tonny Bollman, FACP  Electronically Signed   CDY/MedQ  DD: 04/11/2007  DT: 04/12/2007  Job #: [id number]   cc:   [name], D.O.

## 2010-10-12 NOTE — Op Note (Signed)
[facility]  Patient:    [name] [name], [name] [name]                       MRN: [id number] Proc. Date: 01/08/00 Adm. Date:  [id number] Attending:  Ronne Binning                           Operative Report  PREOPERATIVE DIAGNOSIS:  Carpal tunnel syndrome and stenosing tenosynovitis left hand and left ring finger.  POSTOPERATIVE DIAGNOSIS: Carpal tunnel syndrome and stenosing tenosynovitis left hand and left ring finger.  OPERATION:  Release A1 pulley left ring finger.  Carpal tunnel release left hand.  SURGEON: Shon Baton, M.D.  ASSISTANT: ______  ANESTHESIA:  Forearm based IV regional.  ANESTHESIOLOGIST: Burna Forts, M.D.  HISTORY:  The patient is a 60 year old male with history of carpal tunnel syndrome and the EMG nerve conduction was positive which has not responded to conservative treatment.  PROCEDURE:  The patient was brought to the operating room where forearm based IV regional antiseptic was carried out without difficulty.  He was prepped and draped using Betadine scrub and solution with the left arm free.  An oblique incision was made over the A1 pulley carried down through subcutaneous tissue bleeders were electrocauterized neurovascular structures were identified and protected.  The cystic structure was immediately apparent and this was dissected and sent to pathology.  The A1 pulley was then released on the radial aspect the finger placed through a full range of motion.  No further triggering was identified.  The wound was irrigated and was closed with interrupted 5-0 nylon suture.  A separate incision was then made over the carpal canal longitudinally.  Carried down through subcutaneous tissue bleeders were again electrocauterized and neurovascular structures protected. The palmar fascia was split the superficial palmar arch identified and the flexor tendon to the ring and little finger identified to the ulnar side and the  median nerve.  The carporetinaculum was incised with sharp dissection.  A right angle and Sewall retractor were placed between the skin and forearm fascia and the fascia was released to approximately 3 cm proximal to the wrist crease under direct vision.  The canal was explored persistent median artery was present but was not thrombosed.  Tenosynovial tissue was thickened no further lesions were identified.  The wound was irrigated skin was closed with interrupted 5-0 nylon suture.  A sterile compressive dressing and splint was applied.  The patient tolerated the procedure well and was taken to the recovery room for observation in satisfactory condition.  He is discharged home to return to me in [place] in one week on Vicodin and Keflex. DD:  01/08/00 TD:  01/08/00 Job: [id number]

## 2011-03-14 ENCOUNTER — Other Ambulatory Visit (HOSPITAL_COMMUNITY): Payer: Self-pay | Admitting: Internal Medicine

## 2011-03-14 ENCOUNTER — Ambulatory Visit (HOSPITAL_COMMUNITY)
Admission: RE | Admit: 2011-03-14 | Discharge: 2011-03-14 | Disposition: A | Discharge status: Home / Self Care | Payer: BC Managed Care – PPO | Source: Ambulatory Visit | Attending: Internal Medicine | Admitting: Internal Medicine

## 2011-03-14 DIAGNOSIS — R059 Cough, unspecified: Secondary | ICD-10-CM | POA: Insufficient documentation

## 2011-03-14 DIAGNOSIS — Z0000 Encounter for general adult medical examination without abnormal findings: Secondary | ICD-10-CM | POA: Insufficient documentation

## 2011-03-14 DIAGNOSIS — R05 Cough: Secondary | ICD-10-CM

## 2011-03-14 DIAGNOSIS — I10 Essential (primary) hypertension: Secondary | ICD-10-CM | POA: Insufficient documentation

## 2011-04-04 ENCOUNTER — Encounter: Payer: Self-pay | Admitting: Internal Medicine

## 2011-04-05 ENCOUNTER — Ambulatory Visit (INDEPENDENT_AMBULATORY_CARE_PROVIDER_SITE_OTHER): Payer: BC Managed Care – PPO | Admitting: Internal Medicine

## 2011-04-05 ENCOUNTER — Encounter: Payer: Self-pay | Admitting: Internal Medicine

## 2011-04-05 VITALS — BP 112/68 | HR 98 | Ht 67.5 in | Wt 233.2 lb

## 2011-04-05 DIAGNOSIS — G4730 Sleep apnea, unspecified: Secondary | ICD-10-CM

## 2011-04-05 DIAGNOSIS — G4733 Obstructive sleep apnea (adult) (pediatric): Secondary | ICD-10-CM

## 2011-04-05 NOTE — Patient Instructions (Signed)
Ordered- sent to [facility]- replacement CPAP mask of choice, humidifier and supplies

## 2011-04-05 NOTE — Progress Notes (Signed)
04/05/11- 60 year old male former smoker seen at kind request of Dr. Oneta Rack because of sleep apnea. I had seen him in the past about 7 years ago NPSG 08/25/95- Severe obstructive sleep apnea, AHI 58/hr. He is happy with CPAP but his machine is getting very old. Pressure is set at 12.5. He describes good compliance and control and wears his CPAP every night and for naps. He needs replacement of mask. Bedtime 8 to 9 PM estimating sleep latency 10 minutes and waking 3 times during the night before finally up at 4 AM.  No history of ENT surgery, heart or lung disease. Treated for high blood pressure. Weight today is 233 pounds, up 20 pounds from 1997. 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  ROS- see HPI Constitutional:   No-   weight loss, night sweats, fevers, chills, fatigue, lassitude. HEENT:   No-  headaches, difficulty swallowing, tooth/dental problems, sore throat,       No-  sneezing, itching, ear ache, nasal congestion, post nasal drip,  CV:  No-   chest pain, orthopnea, PND, swelling in lower extremities, anasarca,                                  dizziness, palpitations Resp: No-   shortness of breath with exertion or at rest.              No-   productive cough,  No non-productive cough,  No- coughing up of blood.              No-   change in color of mucus.  No- wheezing.   Skin: No-   rash or lesions. GI:  No-   heartburn, indigestion, abdominal pain, nausea, vomiting, diarrhea,                 change in bowel habits, loss of appetite GU: No-   dysuria, change in color of urine, no urgency or frequency.  No- flank pain. MS:  No-   joint pain or swelling.  No- decreased range of motion.  No- back pain. Neuro-     nothing unusual Psych:  No- change in mood or affect. No depression or anxiety.  No memory loss.  OBJ General- Alert, Oriented, Affect-appropriate, Distress- none acute; overweight Skin- rash-none, lesions- none,  excoriation- none Lymphadenopathy- none Head- atraumatic            Eyes- Gross vision intact, PERRLA, conjunctivae clear secretions            Ears- Hearing, canals-normal            Nose- Clear, no-Septal dev, mucus, polyps, erosion, perforation             Throat- Mallampati III-IV , mucosa clear , drainage- none, tonsils- atrophic Neck- flexible , trachea midline, no stridor , thyroid nl, carotid no bruit Chest - symmetrical excursion , unlabored           Heart/CV- RRR , no murmur , no gallop  , no rub, nl s1 s2                           - JVD- none , edema- none, stasis changes- none, varices- none           Lung- clear to P&A, wheeze- none, cough- none ,  dullness-none, rub- none           Chest wall-  Abd- tender-no, distended-no, bowel sounds-present, HSM- no Br/ Gen/ Rectal- Not done, not indicated Extrem- cyanosis- none, clubbing, none, atrophy- none, strength- nl Neuro- grossly intact to observation

## 2011-04-09 NOTE — Assessment & Plan Note (Addendum)
Very good CPAP compliance and control. He needs replacement of mask and supplies. We discussed the medical issues of sleep apnea and compared treatments. He is very satisfied to remain with CPAP.

## 2011-04-17 ENCOUNTER — Encounter: Payer: Self-pay | Admitting: Internal Medicine

## 2011-07-29 ENCOUNTER — Other Ambulatory Visit: Payer: Self-pay | Admitting: Internal Medicine

## 2011-07-29 DIAGNOSIS — R748 Abnormal levels of other serum enzymes: Secondary | ICD-10-CM

## 2011-08-01 ENCOUNTER — Ambulatory Visit
Admission: RE | Admit: 2011-08-01 | Discharge: 2011-08-01 | Disposition: A | Discharge status: Home / Self Care | Payer: BC Managed Care – PPO | Source: Ambulatory Visit | Attending: Internal Medicine | Admitting: Internal Medicine

## 2011-08-01 DIAGNOSIS — R748 Abnormal levels of other serum enzymes: Secondary | ICD-10-CM

## 2011-08-29 ENCOUNTER — Encounter: Payer: Self-pay | Admitting: Internal Medicine

## 2011-10-01 ENCOUNTER — Other Ambulatory Visit: Payer: Self-pay | Admitting: Internal Medicine

## 2011-10-01 ENCOUNTER — Other Ambulatory Visit (INDEPENDENT_AMBULATORY_CARE_PROVIDER_SITE_OTHER): Payer: BC Managed Care – PPO

## 2011-10-01 ENCOUNTER — Encounter: Payer: Self-pay | Admitting: Internal Medicine

## 2011-10-01 ENCOUNTER — Ambulatory Visit (INDEPENDENT_AMBULATORY_CARE_PROVIDER_SITE_OTHER): Payer: BC Managed Care – PPO | Admitting: Internal Medicine

## 2011-10-01 VITALS — BP 124/80 | HR 71 | Ht 66.0 in | Wt 231.2 lb

## 2011-10-01 DIAGNOSIS — R7989 Other specified abnormal findings of blood chemistry: Secondary | ICD-10-CM

## 2011-10-01 DIAGNOSIS — K760 Fatty (change of) liver, not elsewhere classified: Secondary | ICD-10-CM

## 2011-10-01 DIAGNOSIS — K7689 Other specified diseases of liver: Secondary | ICD-10-CM

## 2011-10-01 DIAGNOSIS — R945 Abnormal results of liver function studies: Secondary | ICD-10-CM

## 2011-10-01 DIAGNOSIS — R932 Abnormal findings on diagnostic imaging of liver and biliary tract: Secondary | ICD-10-CM

## 2011-10-01 DIAGNOSIS — K5731 Diverticulosis of large intestine without perforation or abscess with bleeding: Secondary | ICD-10-CM

## 2011-10-01 DIAGNOSIS — Z86010 Personal history of colonic polyps: Secondary | ICD-10-CM

## 2011-10-01 LAB — CBC WITH DIFFERENTIAL/PLATELET
Eosinophils Relative: 3.5 % (ref 0.0–5.0)
HCT: 47 % (ref 39.0–52.0)
Lymphs Abs: 1.1 10*3/uL (ref 0.7–4.0)
MCHC: 34.4 g/dL (ref 30.0–36.0)
MCV: 93.6 fl (ref 78.0–100.0)
Monocytes Absolute: 0.7 10*3/uL (ref 0.1–1.0)
Platelets: 232 10*3/uL (ref 150.0–400.0)
RDW: 13.1 % (ref 11.5–14.6)
WBC: 4.5 10*3/uL (ref 4.5–10.5)

## 2011-10-01 LAB — HEPATIC FUNCTION PANEL
AST: 60 U/L — ABNORMAL HIGH (ref 0–37)
Alkaline Phosphatase: 35 U/L — ABNORMAL LOW (ref 39–117)
Bilirubin, Direct: 0.1 mg/dL (ref 0.0–0.3)
Total Bilirubin: 0.5 mg/dL (ref 0.3–1.2)

## 2011-10-01 LAB — IRON: Iron: 123 ug/dL (ref 42–165)

## 2011-10-01 LAB — FERRITIN: Ferritin: 162.9 ng/mL (ref 22.0–322.0)

## 2011-10-01 LAB — PROTIME-INR: INR: 1 ratio (ref 0.8–1.0)

## 2011-10-01 NOTE — Progress Notes (Signed)
HISTORY OF PRESENT ILLNESS:  [name] [name] is a 61 y.o. male with hypertension, hyperlipidemia, obesity, sleep apnea, adenomatous colon polyps, and a history of diverticular bleeding. He is sent today regarding a new problem, elevated hepatic transaminases, identified by his primary providers. The patient has undergone prior colonoscopy in 2003, 2007, and most recently in 2012 (admitted with hematochezia). During that hospitalization in March of 2012, his liver tests were normal. His medications at that time included Crestor. Reviewing outside laboratories finds trivial elevation of SGOT in October 2010. Other liver test normal. Followup liver tests in February 2013 and again in April of 2013 reveal modest increase in transaminases with SGOT 2 times the upper limit of normal and SGPT slightly above the upper limit of normal. Normal protein, albumin, alkaline phosphatase, and bilirubin. Abdominal ultrasound was performed 08/01/2011 and was said to show diffuse hepatic steatosis. No other abnormalities. Acute hepatitis serologies were negative. He is sent for evaluation. The patient denies a family history of liver disease or personal alcohol use. Does tell that he had been on multiple antibiotics as well as pain medication the first half of this year as he was being treated for tooth abscess. He thinks that his other medications have been stable. He is on WelChol. His GI review of systems is entirely negative. About 4 years ago he weight 250 pounds. Currently weighs 225- 230 pounds.  REVIEW OF SYSTEMS:  All non-GI ROS negative except for depression  Past Medical History  Diagnosis Date  . Cardiomegaly   . Mixed hyperlipidemia   . Hypertension   . Hypogonadism male   . Sleep apnea   . Obesity   . Colon polyp   . Gallstones   . Pancreas (digestive gland) works poorly     Past Surgical History  Procedure Date  . Cholecystectomy 1976  . Appendectomy 1964    Social History JAHRELL HAMOR   reports that he quit smoking about 37 years ago. His smoking use included Cigarettes. He has a 14 pack-year smoking history. He has never used smokeless tobacco. He reports that he does not drink alcohol or use illicit drugs.  family history includes Diabetes in his father and mother.  No Known Allergies     PHYSICAL EXAMINATION: Vital signs: BP 124/80  Pulse 71  Ht 5\' 6"  (1.676 m)  Wt 231 lb 3.2 oz (104.872 kg)  BMI 37.32 kg/m2  SpO2 98%  Constitutional: obese,generally well-appearing, no acute distress Psychiatric: alert and oriented x3, cooperative Eyes: extraocular movements intact, anicteric, conjunctiva pink Mouth: oral pharynx moist, no lesions. Normal tongue hue Neck: supple no lymphadenopathy Cardiovascular: heart regular rate and rhythm, no murmur Lungs: clear to auscultation bilaterally Abdomen: soft,obese, nontender, nondistended, no obvious ascites, no peritoneal signs, normal bowel sounds, no organomegaly Rectal:committed Extremities: no lower extremity edema bilaterally Skin: no lesions on visible extremities Neuro: No focal deficits. No asterixis.   ASSESSMENT:  #1. Elevated hepatic transaminases likely secondary to fatty liver. Other causes include medication reaction (chronic medications versus medications taken during treatment of his tooth abscess). Rule out other causes of chronic hepatic transaminase elevation #2. History of adenomatous colon polyps. Last colonoscopy March 2012 #3. History of diverticular bleeding. March 2012 #4. Obesity #5. Abnormal ultrasound showing changes consistent with fatty liver  PLAN:  #1. Obtain followup LFTs, PT/INR, and nonviral studies to assess for other causes of elevated hepatic transaminases and assess hepatic synthetic function #2. Exercise and weight loss #3. Routine office followup in 3 months #4. Surveillance  colonoscopy March 2017 #5. Resume general medical care with PCP

## 2011-10-01 NOTE — Patient Instructions (Signed)
Your physician has requested that you go to the basement for lab work before leaving today  Please follow up in 3 months  

## 2011-10-02 LAB — ANTI-SMOOTH MUSCLE ANTIBODY, IGG: Smooth Muscle Ab: 7 U (ref <20)

## 2011-10-02 LAB — CERULOPLASMIN: Ceruloplasmin: 21 mg/dL (ref 20–60)

## 2011-10-03 ENCOUNTER — Encounter: Payer: Self-pay | Admitting: Internal Medicine

## 2011-10-03 ENCOUNTER — Other Ambulatory Visit: Payer: Self-pay | Admitting: Internal Medicine

## 2011-10-03 LAB — ANA: Anti Nuclear Antibody(ANA): NEGATIVE

## 2011-10-03 LAB — MITOCHONDRIAL ANTIBODIES: Mitochondrial M2 Ab, IgG: 0.34 (ref <0.91)

## 2011-11-25 ENCOUNTER — Other Ambulatory Visit (INDEPENDENT_AMBULATORY_CARE_PROVIDER_SITE_OTHER): Payer: BC Managed Care – PPO

## 2011-11-25 DIAGNOSIS — R7989 Other specified abnormal findings of blood chemistry: Secondary | ICD-10-CM

## 2011-11-25 LAB — HEPATIC FUNCTION PANEL
ALT: 62 U/L — ABNORMAL HIGH (ref 0–53)
Albumin: 4.3 g/dL (ref 3.5–5.2)
Alkaline Phosphatase: 28 U/L — ABNORMAL LOW (ref 39–117)
Total Protein: 7.4 g/dL (ref 6.0–8.3)

## 2011-12-26 ENCOUNTER — Ambulatory Visit (INDEPENDENT_AMBULATORY_CARE_PROVIDER_SITE_OTHER): Payer: BC Managed Care – PPO | Admitting: Internal Medicine

## 2011-12-26 ENCOUNTER — Encounter: Payer: Self-pay | Admitting: Internal Medicine

## 2011-12-26 VITALS — BP 114/72 | HR 64 | Ht 66.0 in | Wt 236.2 lb

## 2011-12-26 DIAGNOSIS — R945 Abnormal results of liver function studies: Secondary | ICD-10-CM

## 2011-12-26 DIAGNOSIS — K7689 Other specified diseases of liver: Secondary | ICD-10-CM

## 2011-12-26 DIAGNOSIS — R7989 Other specified abnormal findings of blood chemistry: Secondary | ICD-10-CM

## 2011-12-26 DIAGNOSIS — E669 Obesity, unspecified: Secondary | ICD-10-CM

## 2011-12-26 DIAGNOSIS — K760 Fatty (change of) liver, not elsewhere classified: Secondary | ICD-10-CM

## 2011-12-26 DIAGNOSIS — Z86010 Personal history of colonic polyps: Secondary | ICD-10-CM

## 2011-12-26 NOTE — Patient Instructions (Addendum)
You will be due to have more liver function tests in 3 months.  We will call to remind you when it is time

## 2011-12-26 NOTE — Progress Notes (Signed)
HISTORY OF PRESENT ILLNESS:  [name] [name] is a 61 y.o. male with the below listed medical history who has been seen in this office regarding adenomatous colon polyps, diverticular bleeding, and most recently elevated hepatic transaminases. He presents for followup regarding the latter. His last office visit was 10/01/2011. He has known hepatic steatosis on ultrasound. Testing for viral and nonviral causes for elevated transaminases returned negative. Repeat LFTs 1 month ago or improved but remained slightly abnormal. He has been advised with regards exercise and weight loss. Unfortunately, he has not been successful. Actually gained 5 pounds since his last evaluation. His hepatic synthetic function is normal as are his platelets and MCV. I have reviewed these studies individually with the patient today. He has no interval issues. All questions answered to his satisfaction  REVIEW OF SYSTEMS:  All non-GI ROS negative except for muscle cramps and depression  Past Medical History  Diagnosis Date  . Cardiomegaly   . Mixed hyperlipidemia   . Hypertension   . Hypogonadism male   . Sleep apnea   . Obesity   . Colon polyp   . Gallstones   . Pancreas (digestive gland) works poorly     Past Surgical History  Procedure Date  . Cholecystectomy 1976  . Appendectomy 1964    Social History [name] [name]  reports that he quit smoking about 37 years ago. His smoking use included Cigarettes. He has a 14 pack-year smoking history. He has never used smokeless tobacco. He reports that he does not drink alcohol or use illicit drugs.  family history includes Diabetes in his father and mother.  No Known Allergies     PHYSICAL EXAMINATION: Vital signs: BP 114/72  Pulse 64  Ht 5\' 6"  (1.676 m)  Wt 236 lb 3.2 oz (107.14 kg)  BMI 38.12 kg/m2 General: Well-developed, well-nourished, no acute distress Abdomen: not reexamined. Psychiatric: alert and oriented x3. Cooperative    ASSESSMENT:  #1.  Mild elevation of hepatic transaminases likely secondary to fatty liver. No evidence for impairment of hepatic synthetic function. We discussed this as well as the importance of NASH type fatty liver disease and possible progression to cirrhosis. He understands #2. History of adenomatous colon polyps. Last colonoscopy March 2012 #3 History of diverticular bleeding March 2012 #4. Obesity   PLAN:  #1. Exercise #2. Weight loss. Goal of 15-20 pounds #3. Repeat LFTs in 3 months #4. Surveillance colonoscopy March 2017

## 2012-04-06 ENCOUNTER — Ambulatory Visit (INDEPENDENT_AMBULATORY_CARE_PROVIDER_SITE_OTHER): Payer: BC Managed Care – PPO | Admitting: Internal Medicine

## 2012-04-06 ENCOUNTER — Encounter: Payer: Self-pay | Admitting: Internal Medicine

## 2012-04-06 VITALS — BP 108/78 | HR 60 | Ht 67.0 in | Wt 234.6 lb

## 2012-04-06 DIAGNOSIS — G4733 Obstructive sleep apnea (adult) (pediatric): Secondary | ICD-10-CM

## 2012-04-06 NOTE — Progress Notes (Signed)
04/05/11- 61 year old male former smoker seen at kind request of Dr. Oneta Rack because of sleep apnea. I had seen him in the past about 7 years ago NPSG 08/25/95- Severe obstructive sleep apnea, AHI 58/hr. He is happy with CPAP but his machine is getting very old. Pressure is set at 12.5. He describes good compliance and control and wears his CPAP every night and for naps. He needs replacement of mask. Bedtime 8 to 9 PM estimating sleep latency 10 minutes and waking 3 times during the night before finally up at 4 AM.  No history of ENT surgery, heart or lung disease. Treated for high blood pressure. Weight today is 233 pounds, up 20 pounds from 1997. 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  04/06/12- 71year-old male former smoker followed for sleep apnea complicated by obesity, HBP  Patient states that he wears CPAP 12.5/ Advanced nightly x 6-8hrs. Denies problems with mask or pressure.  He got a replacement CPAP machine last year and is doing very well with good compliance and control. He usually does not use his humidifier. He is working from 6 AM until 2:30 PM and then takes an occasional nap.  ROS- see HPI Constitutional:   No-   weight loss, night sweats, fevers, chills, fatigue, lassitude. HEENT:   No-  headaches, difficulty swallowing, tooth/dental problems, sore throat,       No-  sneezing, itching, ear ache, nasal congestion, post nasal drip,  CV:  No-   chest pain, orthopnea, PND, swelling in lower extremities, anasarca, dizziness, palpitations Resp: No-   shortness of breath with exertion or at rest.              No-   productive cough,  No non-productive cough,  No- coughing up of blood.              No-   change in color of mucus.  No- wheezing.   Skin: No-   rash or lesions. GI:  No-   heartburn, indigestion, abdominal pain, nausea, vomiting,  GU: . MS:  No-   joint pain or swelling.  Neuro-     nothing unusual Psych:  No-  change in mood or affect. No depression or anxiety.  No memory loss.  OBJ BP 108/78  Pulse 60  Ht 5\' 7"  (1.702 m)  Wt 234 lb 9.6 oz (106.414 kg)  BMI 36.74 kg/m2  SpO2 95% General- Alert, Oriented, Affect-appropriate, Distress- none acute; overweight Skin- rash-none, lesions- none, excoriation- none Lymphadenopathy- none Head- atraumatic            Eyes- Gross vision intact, PERRLA, conjunctivae clear secretions            Ears- Hearing, canals-normal            Nose- Clear, no-Septal dev, mucus, polyps, erosion, perforation             Throat- Mallampati III-IV , mucosa clear , drainage- none, tonsils- atrophic Neck- flexible , trachea midline, no stridor , thyroid nl, carotid no bruit Chest - symmetrical excursion , unlabored           Heart/CV- RRR , no murmur , no gallop  , no rub, nl s1 s2                           - JVD- none , edema- none, stasis changes- none, varices- none  Lung- clear to P&A, wheeze- none, cough- none , dullness-none, rub- none           Chest wall-  Abd-  Br/ Gen/ Rectal- Not done, not indicated Extrem- cyanosis- none, clubbing, none, atrophy- none, strength- nl Neuro- grossly intact to observation

## 2012-04-06 NOTE — Patient Instructions (Addendum)
We can continue CPAP 12.5/ Advanced  Please call as needed 

## 2012-04-13 ENCOUNTER — Encounter: Payer: Self-pay | Admitting: Internal Medicine

## 2012-04-13 NOTE — Assessment & Plan Note (Signed)
Good compliance and control. He is comfortable with the pressure and equipment. We discussed available adjustments and reviewed the importance of good sleep hygiene as well as his responsibility to drive safely.

## 2012-06-30 ENCOUNTER — Ambulatory Visit (HOSPITAL_COMMUNITY)
Admission: RE | Admit: 2012-06-30 | Discharge: 2012-06-30 | Disposition: A | Discharge status: Home / Self Care | Payer: BC Managed Care – PPO | Source: Ambulatory Visit | Attending: Internal Medicine | Admitting: Internal Medicine

## 2012-06-30 ENCOUNTER — Other Ambulatory Visit (HOSPITAL_COMMUNITY): Payer: Self-pay | Admitting: Internal Medicine

## 2012-06-30 DIAGNOSIS — R059 Cough, unspecified: Secondary | ICD-10-CM | POA: Insufficient documentation

## 2012-06-30 DIAGNOSIS — R05 Cough: Secondary | ICD-10-CM

## 2012-07-21 ENCOUNTER — Encounter: Payer: Self-pay | Admitting: Cardiovascular Disease

## 2012-07-23 ENCOUNTER — Encounter: Payer: Self-pay | Admitting: Cardiovascular Disease

## 2012-08-06 ENCOUNTER — Encounter: Payer: Self-pay | Admitting: Cardiovascular Disease

## 2012-08-06 ENCOUNTER — Ambulatory Visit (INDEPENDENT_AMBULATORY_CARE_PROVIDER_SITE_OTHER): Payer: BC Managed Care – PPO | Admitting: Cardiovascular Disease

## 2012-08-06 VITALS — BP 117/76 | HR 61 | Ht 66.0 in | Wt 234.0 lb

## 2012-08-06 DIAGNOSIS — R0600 Dyspnea, unspecified: Secondary | ICD-10-CM

## 2012-08-06 DIAGNOSIS — E669 Obesity, unspecified: Secondary | ICD-10-CM

## 2012-08-06 DIAGNOSIS — E782 Mixed hyperlipidemia: Secondary | ICD-10-CM

## 2012-08-06 DIAGNOSIS — I10 Essential (primary) hypertension: Secondary | ICD-10-CM

## 2012-08-06 DIAGNOSIS — R0609 Other forms of dyspnea: Secondary | ICD-10-CM

## 2012-08-06 NOTE — Assessment & Plan Note (Signed)
Likely functional due to weight and sedentary lifestyle  Given multiple CRF's and need to start exercise program will order ETT

## 2012-08-06 NOTE — Progress Notes (Signed)
Patient ID: [name] [name], male   DOB: 21-Jan-1951, 62 y.o.   MRN: [id number] 62 yo referred by Dr Marvel Plan for dyspnea and multiple CRF;s.  Has elevated lipids on statin Last LDL 2/14 82 with normal LFTl;s  Long standing HTN with good Rx.  Salt in diet could be better.  Had normal myvoue 2005.  There was a question of cardiomegaly in 2011 but echo was normal with EF 65%.  He is sedentary A1c 6.0 discussed low carb diet and risk of type 2 diabetes.  Gets some exertional dyspnea.  No chest pain.  Compliant with meds.    ROS: Denies fever, malais, weight loss, blurry vision, decreased visual acuity, cough, sputum, SOB, hemoptysis, pleuritic pain, palpitaitons, heartburn, abdominal pain, melena, lower extremity edema, claudication, or rash.  All other systems reviewed and negative   General: Affect appropriate Healthy:  appears stated age HEENT: normal Neck supple with no adenopathy JVP normal no bruits no thyromegaly Lungs clear with no wheezing and good diaphragmatic motion Heart:  S1/S2 no murmur,rub, gallop or click PMI normal Abdomen: benighn, BS positve, no tenderness, no AAA no bruit.  No HSM or HJR Distal pulses intact with no bruits No edema Neuro non-focal Skin warm and dry No muscular weakness  Medications Current Outpatient Prescriptions  Medication Sig Dispense Refill  . Ascorbic Acid (VITAMIN C) 1000 MG tablet Take 1,000 mg by mouth daily.        Marland Kitchen aspirin 325 MG tablet Take 325 mg by mouth daily.        . Colesevelam HCl [facility]) 3.75 G PACK Take by mouth.        . enalapril (VASOTEC) 20 MG tablet Take 40 mg by mouth daily.       . fenofibrate micronized (LOFIBRA) 134 MG capsule Take 1 tablet by mouth daily.      . fish oil-omega-3 fatty acids 1000 MG capsule Take 1 g by mouth daily.        . hydrochlorothiazide (HYDRODIURIL) 25 MG tablet Take 25 mg by mouth daily.        Marland Kitchen levothyroxine (SYNTHROID, LEVOTHROID) 25 MCG tablet Take 25 mcg by mouth daily.        . metoprolol  succinate (TOPROL-XL) 25 MG 24 hr tablet Take 25 mg by mouth daily.        . Multiple Vitamin (MULTIVITAMIN) capsule Take 1 capsule by mouth daily.        Marland Kitchen pyridOXINE (VITAMIN B-6) 50 MG tablet Take 50 mg by mouth daily.        . rosuvastatin (CRESTOR) 5 MG tablet Take 5 mg by mouth. M,W, and Fridays      . sertraline (ZOLOFT) 100 MG tablet Take 100 mg by mouth daily.        Marland Kitchen testosterone cypionate (DEPOTESTOTERONE CYPIONATE) 200 MG/ML injection Inject into the muscle every 14 (fourteen) days.        . Vitamin D, Ergocalciferol, (DRISDOL) 50000 UNITS CAPS Take 50,000 Units by mouth.         No current facility-administered medications for this visit.    Allergies Review of patient's allergies indicates no known allergies.  Family History: Family History  Problem Relation Age of Onset  . Diabetes Mother   . Diabetes Father     brother and sister    Social History: History   Social History  . Marital Status: Married    Spouse Name: N/A    Number of Children: 2  . Years of  Education: N/A   Occupational History  . Draftsman    Social History Main Topics  . Smoking status: Former Smoker -- 2.00 packs/day for 7 years    Types: Cigarettes    Quit date: 05/27/1974  . Smokeless tobacco: Never Used  . Alcohol Use: No  . Drug Use: No  . Sexually Active: Not on file   Other Topics Concern  . Not on file   Social History Narrative  . No narrative on file    Electrocardiogram: 07/16/12 SR rate 56 T wave inversion lead 3 but has S wave in this lead otherwise normal  Assessment and Plan

## 2012-08-06 NOTE — Assessment & Plan Note (Signed)
Discussed low carb diet and risk of type two diabetes.

## 2012-08-06 NOTE — Assessment & Plan Note (Signed)
Cholesterol is at goal.  Continue current dose of statin and diet Rx.  No myalgias or side effects.  F/U  LFT's in 6 months. No results found for this basename: LDLCALC   LDL 82 continue crestor

## 2012-08-06 NOTE — Assessment & Plan Note (Signed)
Well controlled.  Continue current medications and low sodium Dash type diet.   Assess control with exercise during ETT

## 2012-08-06 NOTE — Patient Instructions (Addendum)
Your physician has requested that you have an exercise tolerance test. For further information please visit [email or web address]. Please also follow instruction sheet, as given.  Your physician recommends that you continue on your current medications as directed. Please refer to the Current Medication list given to you today.  

## 2012-08-17 ENCOUNTER — Encounter: Payer: Self-pay | Admitting: Cardiovascular Disease

## 2012-08-18 ENCOUNTER — Encounter: Payer: BC Managed Care – PPO | Admitting: Physician Assistant

## 2012-08-18 ENCOUNTER — Ambulatory Visit (INDEPENDENT_AMBULATORY_CARE_PROVIDER_SITE_OTHER): Payer: BC Managed Care – PPO | Admitting: Physician Assistant

## 2012-08-18 DIAGNOSIS — E669 Obesity, unspecified: Secondary | ICD-10-CM

## 2012-08-18 DIAGNOSIS — R0989 Other specified symptoms and signs involving the circulatory and respiratory systems: Secondary | ICD-10-CM

## 2012-08-18 DIAGNOSIS — R0600 Dyspnea, unspecified: Secondary | ICD-10-CM

## 2012-08-18 DIAGNOSIS — I10 Essential (primary) hypertension: Secondary | ICD-10-CM

## 2012-08-18 NOTE — Progress Notes (Signed)
Exercise Treadmill Test  Pre-Exercise Testing Evaluation Rhythm: normal sinus  Rate: 64                 Test  Exercise Tolerance Test Ordering MD: Charlton Haws, MD  Interpreting MD: Tereso Newcomer, PA-C  Unique Test No: 1  Treadmill:  1  Indication for ETT: exertional dyspnea  Contraindication to ETT: No   Stress Modality: exercise - treadmill  Cardiac Imaging Performed: non   Protocol: standard Bruce - maximal  Max BP:  224/88  Max MPHR (bpm):  159 85% MPR (bpm):  135  MPHR obtained (bpm):  144 % MPHR obtained:  90%  Reached 85% MPHR (min:sec):  8:20 Total Exercise Time (min-sec):  9:00  Workload in METS:  10.1 Borg Scale: 17  Reason ETT Terminated:  fatigue    ST Segment Analysis At Rest: normal ST segments - no evidence of significant ST depression With Exercise: non-specific ST changes  Other Information Arrhythmia:  No Angina during ETT:  absent (0) Quality of ETT:  diagnostic  ETT Interpretation:  normal - no evidence of ischemia by ST analysis  Comments: Good exercise tolerance. No chest pain. Normal BP response to exercise. No significant ST-T changes to suggest ischemia.   Recommendations: Follow up with Dr. Charlton Haws as directed. Luna Glasgow, PA-C  9:56 AM 08/18/2012

## 2012-09-07 ENCOUNTER — Other Ambulatory Visit: Payer: Self-pay | Admitting: Dermatology

## 2012-10-15 ENCOUNTER — Other Ambulatory Visit: Payer: Self-pay | Admitting: Dermatology

## 2013-04-06 ENCOUNTER — Ambulatory Visit: Payer: BC Managed Care – PPO | Admitting: Internal Medicine

## 2013-04-07 ENCOUNTER — Other Ambulatory Visit: Payer: Self-pay | Admitting: Emergency Medicine

## 2013-04-07 MED ORDER — TADALAFIL 5 MG PO TABS: 5.0000 mg | ORAL_TABLET | ORAL | Status: DC | PRN

## 2013-04-16 ENCOUNTER — Ambulatory Visit (INDEPENDENT_AMBULATORY_CARE_PROVIDER_SITE_OTHER): Payer: BC Managed Care – PPO | Admitting: Internal Medicine

## 2013-04-16 ENCOUNTER — Encounter: Payer: Self-pay | Admitting: Internal Medicine

## 2013-04-16 VITALS — BP 112/82 | HR 72 | Ht 67.0 in | Wt 239.6 lb

## 2013-04-16 DIAGNOSIS — G4733 Obstructive sleep apnea (adult) (pediatric): Secondary | ICD-10-CM

## 2013-04-16 NOTE — Patient Instructions (Signed)
We can continue CPAP 12.5/ Advanced  Please call as needed

## 2013-04-16 NOTE — Progress Notes (Signed)
04/05/11- 62 year old male former smoker seen at kind request of Dr. Oneta Rack because of sleep apnea. I had seen him in the past about 7 years ago NPSG 08/25/95- Severe obstructive sleep apnea, AHI 58/hr. He is happy with CPAP but his machine is getting very old. Pressure is set at 12.5. He describes good compliance and control and wears his CPAP every night and for naps. He needs replacement of mask. Bedtime 8 to 9 PM estimating sleep latency 10 minutes and waking 3 times during the night before finally up at 4 AM.  No history of ENT surgery, heart or lung disease. Treated for high blood pressure. Weight today is 233 pounds, up 20 pounds from 1997. 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  04/06/12- 62year-old male former smoker followed for sleep apnea complicated by obesity, HBP  Patient states that he wears CPAP 12.5/ Advanced nightly x 6-8hrs. Denies problems with mask or pressure.  He got a replacement CPAP machine last year and is doing very well with good compliance and control. He usually does not use his humidifier. He is working from 6 AM until 2:30 PM and then takes an occasional nap.  04/16/13-  62year-old male former smoker followed for sleep apnea complicated by obesity, HBP FOLLOWS FOR:  Wearing CPAP 7-8 hours per night. No concerns today He likes his new CPAP machine-CPAP 12.5/Advanced, nasal mask. Sleeps well with no snoring.  ROS- see HPI Constitutional:   No-   weight loss, night sweats, fevers, chills, fatigue, lassitude. HEENT:   No-  headaches, difficulty swallowing, tooth/dental problems, sore throat,       No-  sneezing, itching, ear ache, nasal congestion, post nasal drip,  CV:  No-   chest pain, orthopnea, PND, swelling in lower extremities, anasarca, dizziness, palpitations Resp: No-   shortness of breath with exertion or at rest.              No-   productive cough,  No non-productive cough,  No- coughing up of  blood.              No-   change in color of mucus.  No- wheezing.   Skin: No-   rash or lesions. GI:  No-   heartburn, indigestion, abdominal pain, nausea, vomiting,  GU: . MS:  No-   joint pain or swelling.  Neuro-     nothing unusual Psych:  No- change in mood or affect. No depression or anxiety.  No memory loss.  OBJ  General- Alert, Oriented, Affect-appropriate, Distress- none acute; overweight. Full beard Skin- rash-none, lesions- none, excoriation- none Lymphadenopathy- none Head- atraumatic            Eyes- Gross vision intact, PERRLA, conjunctivae clear secretions            Ears- Hearing, canals-normal            Nose- Clear, no-Septal dev, mucus, polyps, erosion, perforation             Throat- Mallampati III-IV , mucosa +red, drainage- none, tonsils- atrophic Neck- flexible , trachea midline, no stridor , thyroid nl, carotid no bruit Chest - symmetrical excursion , unlabored           Heart/CV- RRR , no murmur , no gallop  , no rub, nl s1 s2                           -  JVD- none , edema- none, stasis changes- none, varices- none           Lung- clear to P&A, wheeze- none, cough- none , dullness-none, rub- none           Chest wall-  Abd-  Br/ Gen/ Rectal- Not done, not indicated Extrem- cyanosis- none, clubbing, none, atrophy- none, strength- nl Neuro- grossly intact to observation     

## 2013-04-21 ENCOUNTER — Telehealth: Payer: Self-pay | Admitting: Internal Medicine

## 2013-04-21 MED ORDER — SERTRALINE HCL 100 MG PO TABS: 100.0000 mg | ORAL_TABLET | Freq: Every day | ORAL | Status: DC

## 2013-04-21 NOTE — Telephone Encounter (Signed)
Pt has requested sertraline 100mg , one daily.  [facility] said that the pt would send in rx.  Pt said they have never recvd rx to send to [facility]. Please advise  Next appt.with Marchelle Folks 05-06-13

## 2013-05-03 NOTE — Assessment & Plan Note (Signed)
Good compliance and control. Current pressure is comfortable and effective. No changes are appropriate at this time. We discussed long-term goals and driving safety.

## 2013-05-04 ENCOUNTER — Encounter: Payer: Self-pay | Admitting: Physician Assistant

## 2013-05-04 DIAGNOSIS — R7303 Prediabetes: Secondary | ICD-10-CM

## 2013-05-04 DIAGNOSIS — E559 Vitamin D deficiency, unspecified: Secondary | ICD-10-CM | POA: Insufficient documentation

## 2013-05-06 ENCOUNTER — Encounter: Payer: Self-pay | Admitting: Physician Assistant

## 2013-05-06 ENCOUNTER — Other Ambulatory Visit: Payer: Self-pay | Admitting: Physician Assistant

## 2013-05-06 ENCOUNTER — Ambulatory Visit (INDEPENDENT_AMBULATORY_CARE_PROVIDER_SITE_OTHER): Payer: BC Managed Care – PPO | Admitting: Physician Assistant

## 2013-05-06 VITALS — BP 138/88 | HR 60 | Temp 98.4°F | Resp 16 | Ht 67.5 in | Wt 226.0 lb

## 2013-05-06 DIAGNOSIS — E291 Testicular hypofunction: Secondary | ICD-10-CM

## 2013-05-06 DIAGNOSIS — R7309 Other abnormal glucose: Secondary | ICD-10-CM

## 2013-05-06 DIAGNOSIS — E559 Vitamin D deficiency, unspecified: Secondary | ICD-10-CM

## 2013-05-06 DIAGNOSIS — I10 Essential (primary) hypertension: Secondary | ICD-10-CM

## 2013-05-06 DIAGNOSIS — R7303 Prediabetes: Secondary | ICD-10-CM

## 2013-05-06 DIAGNOSIS — E785 Hyperlipidemia, unspecified: Secondary | ICD-10-CM

## 2013-05-06 DIAGNOSIS — E782 Mixed hyperlipidemia: Secondary | ICD-10-CM

## 2013-05-06 LAB — LIPID PANEL
Cholesterol: 198 mg/dL (ref 0–200)
Total CHOL/HDL Ratio: 5 Ratio
Triglycerides: 209 mg/dL — ABNORMAL HIGH (ref <150)
VLDL: 42 mg/dL — ABNORMAL HIGH (ref 0–40)

## 2013-05-06 LAB — CBC WITH DIFFERENTIAL/PLATELET
Eosinophils Relative: 3 % (ref 0–5)
HCT: 52.8 % — ABNORMAL HIGH (ref 39.0–52.0)
Lymphocytes Relative: 15 % (ref 12–46)
Lymphs Abs: 1.3 10*3/uL (ref 0.7–4.0)
MCH: 29.2 pg (ref 26.0–34.0)
MCV: 85.7 fL (ref 78.0–100.0)
Monocytes Absolute: 0.8 10*3/uL (ref 0.1–1.0)
RBC: 6.16 MIL/uL — ABNORMAL HIGH (ref 4.22–5.81)
RDW: 15.3 % (ref 11.5–15.5)
WBC: 8.1 10*3/uL (ref 4.0–10.5)

## 2013-05-06 LAB — HEPATIC FUNCTION PANEL
ALT: 41 U/L (ref 0–53)
AST: 31 U/L (ref 0–37)
Albumin: 4.7 g/dL (ref 3.5–5.2)
Alkaline Phosphatase: 43 U/L (ref 39–117)
Total Protein: 7.3 g/dL (ref 6.0–8.3)

## 2013-05-06 LAB — BASIC METABOLIC PANEL WITH GFR
BUN: 21 mg/dL (ref 6–23)
CO2: 29 mEq/L (ref 19–32)
Chloride: 102 mEq/L (ref 96–112)
Creat: 1.02 mg/dL (ref 0.50–1.35)

## 2013-05-06 LAB — HEMOGLOBIN A1C: Hgb A1c MFr Bld: 6 % — ABNORMAL HIGH (ref <5.7)

## 2013-05-06 LAB — TSH: TSH: 4.109 u[IU]/mL (ref 0.350–4.500)

## 2013-05-06 NOTE — Progress Notes (Addendum)
HPI Patient presents for 3 month follow up with hypertension, hyperlipidemia, prediabetes and vitamin D. Patient's blood pressure has been controlled at home. Patient denies chest pain, shortness of breath, dizziness.  Patient's cholesterol is diet controlled.  The cholesterol last visit was LDL of 98, HDL 34, and trigs 215.  The patient has been working on diet and exercise for prediabetes, and denies changes in vision, polys, and paresthesias.  A1C was 5.7. Patient was sent to [facility] for elevated CPK (769)  and myalgias, we have not received these notes and will request them. According to the patient he had EMG and the doctor said that it was his muscles but his nerves. He also had an MRI of his lower back thinking it was from a pinched nerve.  Patient is on Vitamin D supplement. Vitamin D was 74.  Current Medications:  Current Outpatient Prescriptions on File Prior to Visit  Medication Sig Dispense Refill  . Ascorbic Acid (VITAMIN C) 1000 MG tablet Take 1,000 mg by mouth daily.        Marland Kitchen aspirin 325 MG tablet Take 325 mg by mouth daily.        . fish oil-omega-3 fatty acids 1000 MG capsule Take 1 g by mouth daily.        . hydrochlorothiazide (HYDRODIURIL) 25 MG tablet Take 25 mg by mouth daily.        Marland Kitchen levothyroxine (SYNTHROID, LEVOTHROID) 25 MCG tablet Take 50 mcg by mouth daily.       Marland Kitchen losartan (COZAAR) 100 MG tablet 1 tablet daily.      . metoprolol succinate (TOPROL-XL) 25 MG 24 hr tablet Take 25 mg by mouth daily.        . Multiple Vitamin (MULTIVITAMIN) capsule Take 1 capsule by mouth daily.        Marland Kitchen pyridOXINE (VITAMIN B-6) 50 MG tablet Take 50 mg by mouth daily.        . sertraline (ZOLOFT) 100 MG tablet Take 1 tablet (100 mg total) by mouth daily.  90 tablet  1  . tadalafil (CIALIS) 5 MG tablet Take 1 tablet (5 mg total) by mouth as needed for erectile dysfunction.  90 tablet  1  . testosterone cypionate (DEPOTESTOTERONE CYPIONATE) 200 MG/ML injection Inject into the muscle every 14  (fourteen) days.         No current facility-administered medications on file prior to visit.   Medical History:  Past Medical History  Diagnosis Date  . Cardiomegaly   . Mixed hyperlipidemia   . Hypertension   . Hypogonadism male   . Sleep apnea   . Obesity   . Colon polyp   . Gallstones   . Pancreas (digestive gland) works poorly   . Prediabetes   . Vitamin D deficiency   . Fatty liver disease, nonalcoholic    Allergies:  Allergies  Allergen Reactions  . Ace Inhibitors     cough  . Bee Venom     ROS Constitutional: Denies fever, chills, weight loss/gain, headaches, insomnia, fatigue, night sweats, and change in appetite. Eyes: Denies redness, blurred vision, diplopia, discharge, itchy, watery eyes.  ENT: Denies discharge, congestion, post nasal drip, sore throat, earache, dental pain, Tinnitus, Vertigo, Sinus pain, snoring.  Cardio: Denies chest pain, palpitations, irregular heartbeat,  dyspnea, diaphoresis, orthopnea, PND, claudication, edema Respiratory: denies cough, dyspnea,pleurisy, hoarseness, wheezing.  Gastrointestinal: Denies dysphagia, heartburn,  water brash, pain, cramps, nausea, vomiting, bloating, diarrhea, constipation, hematemesis, melena, hematochezia,  hemorrhoids Genitourinary: Denies dysuria, frequency, urgency,  nocturia, hesitancy, discharge, hematuria, flank pain Musculoskeletal: Denies arthralgia, myalgia, stiffness, Jt. Swelling, pain, limp, and strain/sprain. Skin: Denies pruritis, rash, hives, warts, acne, eczema, changing in skin lesion Neuro: Denies Weakness, tremor, incoordination, spasms, paresthesia, pain Psychiatric: Denies confusion, memory loss, sensory loss Endocrine: Denies change in weight, skin, hair change, nocturia, and paresthesia, Diabetic Polys, Denies visual blurring, hyper /hypo glycemic episodes.  Heme/Lymph: Denies Excessive bleeding, bruising, enlarged lymph nodes  Family history- Review and unchanged Social history- Review  and unchanged Physical Exam: Filed Vitals:   05/06/13 0833  BP: 138/88  Pulse: 60  Temp: 98.4 F (36.9 C)  Resp: 16   Filed Weights   05/06/13 0833  Weight: 226 lb (102.513 kg)   General Appearance: Well nourished, in no apparent distress. Eyes: PERRLA, EOMs, conjunctiva no swelling or erythema, normal fundi and vessels. Sinuses: No Frontal/maxillary tenderness ENT/Mouth: Ext aud canals clear, with TMs without erythema, bulging.No erythema, swelling, or exudate on post pharynx.  Tonsils not swollen or erythematous. Hearing normal.  Neck: Supple, thyroid normal.  Respiratory: Respiratory effort normal, BS equal bilaterally without rales, rhonchi, wheezing or stridor.  Cardio: Heart sounds normal, regular rate and rhythm without murmurs, rubs or gallops. Peripheral pulses brisk and equal bilaterally, without edema.  Abdomen: Obese, soft, with bowel sounds. Non tender, no guarding, rebound, hernias, masses, or organomegaly.  Lymphatics: Non tender without lymphadenopathy.  Musculoskeletal: Full ROM all peripheral extremities, joint stability, 5/5 strength, and normal gait. Skin: Warm, dry without rashes, lesions, ecchymosis.  Neuro: Cranial nerves intact, reflexes equal bilaterally. Normal muscle tone, no cerebellar symptoms.  Psych: Awake and oriented X 3, normal affect, Insight and Judgment appropriate.   Assessment and Plan:  Hypertension: Continue medication, monitor blood pressure at home. Continue DASH diet. Cholesterol: Continue diet and exercise. Check cholesterol.  Pre-diabetes-Continue diet and exercise. Check A1C Vitamin D Def- check level and continue medications.  Myalgias- continue to follow up [facility], after reviewing the notes, we will not resume the statin.  Long discussion diet and weight loss and how this will help decrease pains and help his back.   Quentin Mulling 8:47 AM

## 2013-05-06 NOTE — Patient Instructions (Signed)
   Bad carbs also include fruit juice, alcohol, and sweet tea. These are empty calories that do not signal to your brain that you are full.   Please remember the good carbs are still carbs which convert into sugar. So please measure them out no more than 1/2-1 cup of rice, oatmeal, pasta, and beans.  Veggies are however free foods! Pile them on.   I like lean protein at every meal such as chicken, turkey, pork chops, cottage cheese, etc. Just do not fry these meats and please center your meal around vegetable, the meats should be a side dish.   No all fruit is created equal. Please see the list below, the fruit at the bottom is higher in sugars than the fruit at the top   We want weight loss that will last so you should lose 1-2 pounds a week.  THAT IS IT! Please pick THREE things a month to change. Once it is a habit check off the item. Then pick another three items off the list to become habits.  If you are already doing a habit on the list GREAT!  Cross that item off! o Don't drink your calories. Ie, alcohol, soda, fruit juice, and sweet tea.  o Drink more water. Drink a glass when you feel hungry or before each meal.  o Eat breakfast - Complex carb and protein (likeDannon light and fit yogurt, oatmeal, fruit, eggs, turkey bacon). o Measure your cereal.  Eat no more than one cup a day. (ie Kashi) o Eat an apple a day. o Add a vegetable a day. o Try a new vegetable a month. o Use Pam! Stop using oil or butter to cook. o Don't finish your plate or use smaller plates. o Share your dessert. o Eat sugar free Jello for dessert or frozen grapes. o Don't eat 2-3 hours before bed. o Switch to whole wheat bread, pasta, and brown rice. o Make healthier choices when you eat out. No fries! o Pick baked chicken, NOT fried. o Don't forget to SLOW DOWN when you eat. It is not going anywhere.  o Take the stairs. o Park far away in the parking lot o Lift soup cans (or weights) for 10 minutes while  watching TV. o Walk at work for 10 minutes during break. o Walk outside 1 time a week with your friend, kids, dog, or significant other. o Start a walking group at church. o Walk the mall as much as you can tolerate.  o Keep a food diary. o Weigh yourself daily. o Walk for 15 minutes 3 days per week. o Cook at home more often and eat out less.  If life happens and you go back to old habits, it is okay.  Just start over. You can do it!   If you experience chest pain, get short of breath, or tired during the exercise, please stop immediately and inform your doctor.  Remember exercise is great for your cardiovascular health and can help with weight loss but YOU CAN NOT OUT RUN YOUR FORK!     

## 2013-05-07 LAB — VITAMIN D 25 HYDROXY (VIT D DEFICIENCY, FRACTURES): Vit D, 25-Hydroxy: 85 ng/mL (ref 30–89)

## 2013-05-07 LAB — INSULIN, FASTING: Insulin fasting, serum: 24 u[IU]/mL (ref 3–28)

## 2013-05-11 LAB — FERRITIN: Ferritin: 62 ng/mL (ref 22–322)

## 2013-05-11 LAB — IRON AND TIBC
%SAT: 29 % (ref 20–55)
Iron: 114 ug/dL (ref 42–165)
UIBC: 285 ug/dL (ref 125–400)

## 2013-05-24 ENCOUNTER — Other Ambulatory Visit: Payer: Self-pay | Admitting: Internal Medicine

## 2013-05-24 DIAGNOSIS — E291 Testicular hypofunction: Secondary | ICD-10-CM

## 2013-05-24 MED ORDER — TESTOSTERONE CYPIONATE 200 MG/ML IM SOLN: 400.0000 mg | INTRAMUSCULAR | Status: DC

## 2013-06-08 ENCOUNTER — Other Ambulatory Visit: Payer: Self-pay | Admitting: Physician Assistant

## 2013-06-08 ENCOUNTER — Other Ambulatory Visit: Payer: BC Managed Care – PPO

## 2013-06-08 DIAGNOSIS — D649 Anemia, unspecified: Secondary | ICD-10-CM

## 2013-06-09 LAB — CBC WITH DIFFERENTIAL/PLATELET
Basophils Absolute: 0 10*3/uL (ref 0.0–0.1)
Basophils Relative: 1 % (ref 0–1)
EOS PCT: 4 % (ref 0–5)
Eosinophils Absolute: 0.2 10*3/uL (ref 0.0–0.7)
HCT: 56.3 % — ABNORMAL HIGH (ref 39.0–52.0)
Hemoglobin: 19.3 g/dL — ABNORMAL HIGH (ref 13.0–17.0)
LYMPHS PCT: 25 % (ref 12–46)
Lymphs Abs: 1.2 10*3/uL (ref 0.7–4.0)
MCH: 29.2 pg (ref 26.0–34.0)
MCHC: 34.3 g/dL (ref 30.0–36.0)
MCV: 85.2 fL (ref 78.0–100.0)
Monocytes Absolute: 0.8 10*3/uL (ref 0.1–1.0)
Monocytes Relative: 18 % — ABNORMAL HIGH (ref 3–12)
NEUTROS PCT: 52 % (ref 43–77)
Neutro Abs: 2.5 10*3/uL (ref 1.7–7.7)
PLATELETS: 161 10*3/uL (ref 150–400)
RBC: 6.61 MIL/uL — ABNORMAL HIGH (ref 4.22–5.81)
RDW: 16.3 % — ABNORMAL HIGH (ref 11.5–15.5)
WBC: 4.7 10*3/uL (ref 4.0–10.5)

## 2013-06-09 LAB — FOLATE RBC: RBC Folate: 358 ng/mL (ref >280)

## 2013-07-13 ENCOUNTER — Other Ambulatory Visit: Payer: Self-pay

## 2013-07-21 ENCOUNTER — Other Ambulatory Visit: Payer: Self-pay

## 2013-07-26 ENCOUNTER — Ambulatory Visit (INDEPENDENT_AMBULATORY_CARE_PROVIDER_SITE_OTHER): Payer: BC Managed Care – PPO | Admitting: Emergency Medicine

## 2013-07-26 ENCOUNTER — Encounter: Payer: Self-pay | Admitting: Emergency Medicine

## 2013-07-26 VITALS — BP 114/80 | HR 60 | Temp 98.6°F | Resp 18 | Ht 67.5 in | Wt 234.0 lb

## 2013-07-26 DIAGNOSIS — E782 Mixed hyperlipidemia: Secondary | ICD-10-CM

## 2013-07-26 DIAGNOSIS — R29898 Other symptoms and signs involving the musculoskeletal system: Secondary | ICD-10-CM

## 2013-07-26 DIAGNOSIS — Z1212 Encounter for screening for malignant neoplasm of rectum: Secondary | ICD-10-CM

## 2013-07-26 DIAGNOSIS — Z125 Encounter for screening for malignant neoplasm of prostate: Secondary | ICD-10-CM

## 2013-07-26 DIAGNOSIS — Z23 Encounter for immunization: Secondary | ICD-10-CM

## 2013-07-26 DIAGNOSIS — Z79899 Other long term (current) drug therapy: Secondary | ICD-10-CM

## 2013-07-26 DIAGNOSIS — R6889 Other general symptoms and signs: Secondary | ICD-10-CM

## 2013-07-26 DIAGNOSIS — E559 Vitamin D deficiency, unspecified: Secondary | ICD-10-CM

## 2013-07-26 DIAGNOSIS — Z111 Encounter for screening for respiratory tuberculosis: Secondary | ICD-10-CM

## 2013-07-26 DIAGNOSIS — Z0000 Encounter for general adult medical examination without abnormal findings: Secondary | ICD-10-CM

## 2013-07-26 DIAGNOSIS — I10 Essential (primary) hypertension: Secondary | ICD-10-CM

## 2013-07-26 LAB — CBC WITH DIFFERENTIAL/PLATELET
Basophils Absolute: 0.1 10*3/uL (ref 0.0–0.1)
Basophils Relative: 1 % (ref 0–1)
Eosinophils Absolute: 0.2 10*3/uL (ref 0.0–0.7)
Eosinophils Relative: 4 % (ref 0–5)
HCT: 54.9 % — ABNORMAL HIGH (ref 39.0–52.0)
HEMOGLOBIN: 19.3 g/dL — AB (ref 13.0–17.0)
LYMPHS ABS: 1.4 10*3/uL (ref 0.7–4.0)
Lymphocytes Relative: 25 % (ref 12–46)
MCH: 30.6 pg (ref 26.0–34.0)
MCHC: 35.2 g/dL (ref 30.0–36.0)
MCV: 87 fL (ref 78.0–100.0)
MONOS PCT: 10 % (ref 3–12)
Monocytes Absolute: 0.6 10*3/uL (ref 0.1–1.0)
NEUTROS ABS: 3.4 10*3/uL (ref 1.7–7.7)
Neutrophils Relative %: 60 % (ref 43–77)
Platelets: 216 10*3/uL (ref 150–400)
RBC: 6.31 MIL/uL — AB (ref 4.22–5.81)
RDW: 16.1 % — ABNORMAL HIGH (ref 11.5–15.5)
WBC: 5.7 10*3/uL (ref 4.0–10.5)

## 2013-07-26 NOTE — Progress Notes (Signed)
Subjective:    Patient ID: [name] [name], male    DOB: [birth year], 63 y.o.   MRN: [id number]  HPI Comments: 63 yo pleasant obese centrally WM CPE and presents for 3 month F/U for HTN, Cholesterol, Pre-Dm, D. Deficient, hypogonadism. He is not exercising due to recent increase in leg weakness/ heaviness. He is trying to keep busy.He is not eating healthy despite numerous conversations on risk with uncontrolled cholesterol and unable to take statin with CPK elevation. He notes overall feeling better. He checks BP at home and it has been up a little with stress but lowers with rest. LAST LABS CHOL         198   05/06/2013 HDL           40   05/06/2013 LDLCALC      116   05/06/2013 TRIG         209   05/06/2013 CHOLHDL      5.0   05/06/2013 ALT           41   05/06/2013 AST           31   05/06/2013 ALKPHOS       43   05/06/2013 BILITOT      0.7   05/06/2013 WBC      4.7   06/08/2013 HGB     19.3   06/08/2013 HCT     56.3   06/08/2013 MCV     85.2   06/08/2013 PLT      161   06/08/2013 CREATININE     1.02   05/06/2013 BUN              21   05/06/2013 NA              141   05/06/2013 K               4.7   05/06/2013 CL              102   05/06/2013 CO2              29   05/06/2013 PHGBA1C      6.0   05/06/2013 VIT D 85  He notes PSA was good at Dr Suann Larry 01/2013, rectal exam was WNL per patient. He has f/u again 1 year for HX of elevated PSA while on testosterone. He notes last testosterone shot was yesterday. He has improved energy with testosterone injections.  He notes new spot on right foot that started with itch. He notes spot is red and scaling. He has had + RF in past with Neg remaining Autoimmune panel 09/2012. He notes improvement with spot with neosporin but does completely resolve.  He has noticed some improvement with change of position with chairs with leg heaviness and foot weakness. He wants to consider surgery with foot drop and leg weakness. He notes difficulty riding  motorcycle with changing gears. He has had evaluation with Neuromuscular specialist at [facility] with - EMG testing and + MRI for Degenerative changes in Lumbar spine as follows. He denies any specific trauma to spine. He notes previous history of pain/ weakness was not as significant as current problem. He notes some improvement with PT in the past.  MRI LUMBAR SPINE WITHOUT CONTRAST, May 03, 2013 FINDINGS:.  Alignment: Normal..  Vertebrae: No marrow signal abnormalities to suggest neoplasm..  Conus: In normal position without imaging evidence for tethering.  Normal signal and contour.  .  Degenerative  disc disease:..  T12-L1: Minimal posterior disc bulge effaces the ventral thecal back without significant stenosis. Mild disc desiccation..  L1-L2: No significant focal abnormality..  L2-L3: Posterior broad-based disc bulge and mild facet hypertrophy results in effacement of the nerve roots in the thecal sac and left greater than right lateral recess narrowing. Far left lateral disc herniation which results in mild left foraminal narrowing.Marland Kitchen  L3-L4: Posterior broad-based disc bulge, ligamentum flavum thickening and facet hypertrophy near completely effaces the CSF in the thecal sac. Superimposed central posterior disc herniation contacts the intrathecal nerve roots. Extension of the broad-based disc bulge into both foramen causes mild to moderate foraminal narrowing.Marland Kitchen  L4-L5: Broad posterior disc with possible early central disc herniation or bulge may contact the left L5 nerve root in the lateral recess (best seen on sagittal imaging). Facet hypertrophy and ligamentum flavum thickening results in mild bilateral foraminal narrowing.Marland Kitchen  L5-S1: No significant focal abnormality.Marland Kitchen  Upper Sacrum/Ilium: No significant focal abnormality..     Hypertension  Patient Care Team: Unk Pinto, MD as PCP - General (Internal Medicine) Bernestine Amass, MD as Consulting Physician (Urology) Irene Shipper, MD as Consulting  Physician (Gastroenterology) Melvyn Novas as Referring Physician (Neurology) Lavonna Monarch, MD as Consulting Physician (Dermatology) Dyke Maes (Optometry) Josue Hector, MD as Consulting Physician (Cardiology)  Health Maintenance: Influenza 02/24/13 TDAP 07/24/11 Pneumovax 2005 Colonoscopy 08/03/10 due 2017 EYE Exam WNL 2014 ECHO 08/06/12 Neg CXR 06/30/12 WNL U/S ABD 08/01/11- FATTY LIVER  Current Outpatient Prescriptions on File Prior to Visit  Medication Sig Dispense Refill  . Ascorbic Acid (VITAMIN C) 1000 MG tablet Take 1,000 mg by mouth daily.        Marland Kitchen aspirin 325 MG tablet Take 325 mg by mouth daily.        . B Complex Vitamins (B COMPLEX PO) Take by mouth daily.      . Cholecalciferol (VITAMIN D PO) Take 6,000 Int'l Units by mouth daily.      . fish oil-omega-3 fatty acids 1000 MG capsule Take 1 g by mouth daily.        . hydrochlorothiazide (HYDRODIURIL) 25 MG tablet Take 25 mg by mouth daily.        Marland Kitchen levothyroxine (SYNTHROID, LEVOTHROID) 25 MCG tablet Take 50 mcg by mouth daily.       Marland Kitchen losartan (COZAAR) 100 MG tablet 1 tablet daily.      Marland Kitchen MAGNESIUM PO Take 400 mg by mouth daily.      . metoprolol succinate (TOPROL-XL) 25 MG 24 hr tablet Take 25 mg by mouth daily.        . Multiple Vitamin (MULTIVITAMIN) capsule Take 1 capsule by mouth daily.        Marland Kitchen pyridOXINE (VITAMIN B-6) 50 MG tablet Take 50 mg by mouth daily.        . sertraline (ZOLOFT) 100 MG tablet Take 1 tablet (100 mg total) by mouth daily.  90 tablet  1  . tadalafil (CIALIS) 5 MG tablet Take 1 tablet (5 mg total) by mouth as needed for erectile dysfunction.  90 tablet  1  . testosterone cypionate (DEPOTESTOTERONE CYPIONATE) 200 MG/ML injection Inject 2 mLs (400 mg total) into the muscle every 14 (fourteen) days. And needs 3 cc syringes and 1"  21  G needles  10 mL  5   No current facility-administered medications on file prior to visit.   Allergies  Allergen Reactions  . Ace Inhibitors     cough  .  Bee Venom    Past Medical History  Diagnosis Date  . Cardiomegaly   . Mixed hyperlipidemia   . Hypertension   . Hypogonadism male   . Sleep apnea   . Obesity   . Colon polyp   . Gallstones   . Pancreas (digestive gland) works poorly   . Prediabetes   . Vitamin D deficiency   . Fatty liver disease, nonalcoholic    Past Surgical History  Procedure Laterality Date  . Cholecystectomy  1976  . Appendectomy  1964   History  Substance Use Topics  . Smoking status: Former Smoker -- 2.00 packs/day for 7 years    Types: Cigarettes    Quit date: 05/27/1974  . Smokeless tobacco: Never Used  . Alcohol Use: No   Family History  Problem Relation Age of Onset  . Diabetes Mother   . Diabetes Father     brother and sister  . Heart disease Father   . Hypertension Father   . Diabetes Sister        Review of Systems  Constitutional: Positive for fatigue.  Musculoskeletal: Positive for back pain and myalgias.  Neurological: Positive for weakness.  All other systems reviewed and are negative.   BP 114/80  Pulse 60  Temp(Src) 98.6 F (37 C) (Temporal)  Resp 18  Ht 5' 7.5" (1.715 m)  Wt 234 lb (106.142 kg)  BMI 36.09 kg/m2     Objective:   Physical Exam  Nursing note and vitals reviewed. Constitutional: He is oriented to person, place, and time. He appears well-developed and well-nourished.  Obese centrally  HENT:  Head: Normocephalic and atraumatic.  Right Ear: External ear normal.  Left Ear: External ear normal.  Nose: Nose normal.  Mouth/Throat: No oropharyngeal exudate.  Eyes: Conjunctivae and EOM are normal. Pupils are equal, round, and reactive to light. Right eye exhibits no discharge. Left eye exhibits no discharge. No scleral icterus.  Neck: Normal range of motion. Neck supple. No JVD present. No tracheal deviation present. No thyromegaly present.  Cardiovascular: Normal rate, regular rhythm, normal heart sounds and intact distal pulses.   Pulmonary/Chest:  Effort normal and breath sounds normal.  Abdominal: Soft. Bowel sounds are normal. He exhibits no distension and no mass. There is no tenderness. There is no rebound and no guarding.  Genitourinary:  Def Urology  Musculoskeletal: Normal range of motion. He exhibits no edema and no tenderness.       Feet:  Lymphadenopathy:    He has no cervical adenopathy.  Neurological: He is alert and oriented to person, place, and time. He has normal reflexes. He exhibits normal muscle tone. Coordination normal.  Mild decreased flexor stregth left>right  Skin: Skin is warm and dry. Rash noted. There is erythema. No pallor.  Quarter size elevated mild erythema with silvery plaque with mild elevation right lateral/ anterior foot- see diagram  Psychiatric: He has a normal mood and affect. His behavior is normal. Judgment and thought content normal.    EKG NSCSPT WNL, IRBBB V2 AORTA SCAN WNL      Assessment & Plan:  1. CPE- Update screening labs/ History/ Immunizations/ Testing as needed. Advised healthy diet, QD exercise, increase H20 and continue RX/ Vitamins AD. 2. 3 month F/U for Obese,  HTN, Cholesterol, Pre-Dm, D. Deficient, hypogonadism. Needs healthy diet, cardio QD and obtain healthy weight. Check Labs, Check BP if >130/80 call office, Long discussion on need for compliance with inability to take statin with elevated CPK, Patient aware of risks  with elevated cholesterol. 3. Back pain/ leg weakness left- Advise Neurosurgeon 2nd opinion consider PT if surgery is not indicated. Advised needs to decrease belly fat/ size and try to strengthen legs/ core muscles. Prefer water exercise with walking in shallow pool. REF FOR VASCULAR EVALUATION 4. Elevated CPK HX- recheck labs 5. ? Psoriasis plaque right ankle- consider RX Steroid cream vs derm referral

## 2013-07-26 NOTE — Patient Instructions (Signed)
Fat and Cholesterol Control Diet Fat and cholesterol levels in your blood and organs are influenced by your diet. High levels of fat and cholesterol may lead to diseases of the heart, small and large blood vessels, gallbladder, liver, and pancreas. CONTROLLING FAT AND CHOLESTEROL WITH DIET Although exercise and lifestyle factors are important, your diet is key. That is because certain foods are known to raise cholesterol and others to lower it. The goal is to balance foods for their effect on cholesterol and more importantly, to replace saturated and trans fat with other types of fat, such as monounsaturated fat, polyunsaturated fat, and omega-3 fatty acids. On average, a person should consume no more than 15 to 17 g of saturated fat daily. Saturated and trans fats are considered "bad" fats, and they will raise LDL cholesterol. Saturated fats are primarily found in animal products such as meats, butter, and cream. However, that does not mean you need to give up all your favorite foods. Today, there are good tasting, low-fat, low-cholesterol substitutes for most of the things you like to eat. Choose low-fat or nonfat alternatives. Choose round or loin cuts of red meat. These types of cuts are lowest in fat and cholesterol. Chicken (without the skin), fish, veal, and ground turkey breast are great choices. Eliminate fatty meats, such as hot dogs and salami. Even shellfish have little or no saturated fat. Have a 3 oz (85 g) portion when you eat lean meat, poultry, or fish. Trans fats are also called "partially hydrogenated oils." They are oils that have been scientifically manipulated so that they are solid at room temperature resulting in a longer shelf life and improved taste and texture of foods in which they are added. Trans fats are found in stick margarine, some tub margarines, cookies, crackers, and baked goods.  When baking and cooking, oils are a great substitute for butter. The monounsaturated oils are  especially beneficial since it is believed they lower LDL and raise HDL. The oils you should avoid entirely are saturated tropical oils, such as coconut and palm.  Remember to eat a lot from food groups that are naturally free of saturated and trans fat, including fish, fruit, vegetables, beans, grains (barley, rice, couscous, bulgur wheat), and pasta (without cream sauces).  IDENTIFYING FOODS THAT LOWER FAT AND CHOLESTEROL  Soluble fiber may lower your cholesterol. This type of fiber is found in fruits such as apples, vegetables such as broccoli, potatoes, and carrots, legumes such as beans, peas, and lentils, and grains such as barley. Foods fortified with plant sterols (phytosterol) may also lower cholesterol. You should eat at least 2 g per day of these foods for a cholesterol lowering effect.  Read package labels to identify low-saturated fats, trans fat free, and low-fat foods at the supermarket. Select cheeses that have only 2 to 3 g saturated fat per ounce. Use a heart-healthy tub margarine that is free of trans fats or partially hydrogenated oil. When buying baked goods (cookies, crackers), avoid partially hydrogenated oils. Breads and muffins should be made from whole grains (whole-wheat or whole oat flour, instead of "flour" or "enriched flour"). Buy non-creamy canned soups with reduced salt and no added fats.  FOOD PREPARATION TECHNIQUES  Never deep-fry. If you must fry, either stir-fry, which uses very little fat, or use non-stick cooking sprays. When possible, broil, bake, or roast meats, and steam vegetables. Instead of putting butter or margarine on vegetables, use lemon and herbs, applesauce, and cinnamon (for squash and sweet potatoes). Use nonfat   yogurt, salsa, and low-fat dressings for salads.  LOW-SATURATED FAT / LOW-FAT FOOD SUBSTITUTES Meats / Saturated Fat (g)  Avoid: Steak, marbled (3 oz/85 g) / 11 g  Choose: Steak, lean (3 oz/85 g) / 4 g  Avoid: Hamburger (3 oz/85 g) / 7  g  Choose: Hamburger, lean (3 oz/85 g) / 5 g  Avoid: Ham (3 oz/85 g) / 6 g  Choose: Ham, lean cut (3 oz/85 g) / 2.4 g  Avoid: Chicken, with skin, dark meat (3 oz/85 g) / 4 g  Choose: Chicken, skin removed, dark meat (3 oz/85 g) / 2 g  Avoid: Chicken, with skin, light meat (3 oz/85 g) / 2.5 g  Choose: Chicken, skin removed, light meat (3 oz/85 g) / 1 g Dairy / Saturated Fat (g)  Avoid: Whole milk (1 cup) / 5 g  Choose: Low-fat milk, 2% (1 cup) / 3 g  Choose: Low-fat milk, 1% (1 cup) / 1.5 g  Choose: Skim milk (1 cup) / 0.3 g  Avoid: Hard cheese (1 oz/28 g) / 6 g  Choose: Skim milk cheese (1 oz/28 g) / 2 to 3 g  Avoid: Cottage cheese, 4% fat (1 cup) / 6.5 g  Choose: Low-fat cottage cheese, 1% fat (1 cup) / 1.5 g  Avoid: Ice cream (1 cup) / 9 g  Choose: Sherbet (1 cup) / 2.5 g  Choose: Nonfat frozen yogurt (1 cup) / 0.3 g  Choose: Frozen fruit bar / trace  Avoid: Whipped cream (1 tbs) / 3.5 g  Choose: Nondairy whipped topping (1 tbs) / 1 g Condiments / Saturated Fat (g)  Avoid: Mayonnaise (1 tbs) / 2 g  Choose: Low-fat mayonnaise (1 tbs) / 1 g  Avoid: Butter (1 tbs) / 7 g  Choose: Extra light margarine (1 tbs) / 1 g  Avoid: Coconut oil (1 tbs) / 11.8 g  Choose: Olive oil (1 tbs) / 1.8 g  Choose: Corn oil (1 tbs) / 1.7 g  Choose: Safflower oil (1 tbs) / 1.2 g  Choose: Sunflower oil (1 tbs) / 1.4 g  Choose: Soybean oil (1 tbs) / 2.4 g  Choose: Canola oil (1 tbs) / 1 g Document Released: 05/13/2005 Document Revised: 09/07/2012 Document Reviewed: 11/01/2010 [employer] Patient Information 2014 [employer]. Pneumococcal Vaccine, Polyvalent solution for injection What is this medicine? PNEUMOCOCCAL VACCINE, POLYVALENT (NEU mo KOK al vak SEEN, pol ee VEY luhnt) is a vaccine to prevent pneumococcus bacteria infection. These bacteria are a major cause of ear infections, Strep throat infections, and serious pneumonia, meningitis, or blood infections  worldwide. These vaccines help the body to produce antibodies (protective substances) that help your body defend against these bacteria. This vaccine is recommended for people 2 years of age and older with health problems. It is also recommended for all adults over 50 years old. This vaccine will not treat an infection. This medicine may be used for other purposes; ask your health care provider or pharmacist if you have questions. COMMON BRAND NAME(S): Pneumovax 23 What should I tell my health care provider before I take this medicine? They need to know if you have any of these conditions: -bleeding problems -bone marrow or organ transplant -cancer, Hodgkin's disease -fever -infection -immune system problems -low platelet count in the blood -seizures -an unusual or allergic reaction to pneumococcal vaccine, diphtheria toxoid, other vaccines, latex, other medicines, foods, dyes, or preservatives -pregnant or trying to get pregnant -breast-feeding How should I use this medicine? This vaccine is for   injection into a muscle or under the skin. It is given by a health care professional. A copy of Vaccine Information Statements will be given before each vaccination. Read this sheet carefully each time. The sheet may change frequently. Talk to your pediatrician regarding the use of this medicine in children. While this drug may be prescribed for children as young as 53 years of age for selected conditions, precautions do apply. Overdosage: If you think you have taken too much of this medicine contact a poison control center or emergency room at once. NOTE: This medicine is only for you. Do not share this medicine with others. What if I miss a dose? It is important not to miss your dose. Call your doctor or health care professional if you are unable to keep an appointment. What may interact with this medicine? -medicines for cancer chemotherapy -medicines that suppress your immune  function -medicines that treat or prevent blood clots like warfarin, enoxaparin, and dalteparin -steroid medicines like prednisone or cortisone This list may not describe all possible interactions. Give your health care provider a list of all the medicines, herbs, non-prescription drugs, or dietary supplements you use. Also tell them if you smoke, drink alcohol, or use illegal drugs. Some items may interact with your medicine. What should I watch for while using this medicine? Mild fever and pain should go away in 3 days or less. Report any unusual symptoms to your doctor or health care professional. What side effects may I notice from receiving this medicine? Side effects that you should report to your doctor or health care professional as soon as possible: -allergic reactions like skin rash, itching or hives, swelling of the face, lips, or tongue -breathing problems -confused -fever over 102 degrees F -pain, tingling, numbness in the hands or feet -seizures -unusual bleeding or bruising -unusual muscle weakness Side effects that usually do not require medical attention (report to your doctor or health care professional if they continue or are bothersome): -aches and pains -diarrhea -fever of 102 degrees F or less -headache -irritable -loss of appetite -pain, tender at site where injected -trouble sleeping This list may not describe all possible side effects. Call your doctor for medical advice about side effects. You may report side effects to FDA at 1-800-FDA-1088. Where should I keep my medicine? This does not apply. This vaccine is given in a clinic, pharmacy, doctor's office, or other health care setting and will not be stored at home. NOTE: This sheet is a summary. It may not cover all possible information. If you have questions about this medicine, talk to your doctor, pharmacist, or health care provider.  2014, [employer] Standard. (2007-12-18 14:32:37)

## 2013-07-27 LAB — LIPID PANEL
Cholesterol: 226 mg/dL — ABNORMAL HIGH (ref 0–200)
HDL: 37 mg/dL — ABNORMAL LOW (ref >39)
LDL Cholesterol: 120 mg/dL — ABNORMAL HIGH (ref 0–99)
Total CHOL/HDL Ratio: 6.1 Ratio
Triglycerides: 343 mg/dL — ABNORMAL HIGH (ref <150)
VLDL: 69 mg/dL — ABNORMAL HIGH (ref 0–40)

## 2013-07-27 LAB — URINALYSIS, ROUTINE W REFLEX MICROSCOPIC
Bilirubin Urine: NEGATIVE
GLUCOSE, UA: NEGATIVE mg/dL
Hgb urine dipstick: NEGATIVE
Ketones, ur: NEGATIVE mg/dL
Leukocytes, UA: NEGATIVE
Nitrite: NEGATIVE
PH: 6 (ref 5.0–8.0)
Protein, ur: NEGATIVE mg/dL
Specific Gravity, Urine: 1.023 (ref 1.005–1.030)
Urobilinogen, UA: 0.2 mg/dL (ref 0.0–1.0)

## 2013-07-27 LAB — MICROALBUMIN / CREATININE URINE RATIO
CREATININE, URINE: 192 mg/dL
MICROALB/CREAT RATIO: 16.3 mg/g (ref 0.0–30.0)
Microalb, Ur: 3.13 mg/dL — ABNORMAL HIGH (ref 0.00–1.89)

## 2013-07-27 LAB — HEPATIC FUNCTION PANEL
ALK PHOS: 44 U/L (ref 39–117)
ALT: 50 U/L (ref 0–53)
AST: 38 U/L — ABNORMAL HIGH (ref 0–37)
Albumin: 5 g/dL (ref 3.5–5.2)
Bilirubin, Direct: 0.1 mg/dL (ref 0.0–0.3)
Indirect Bilirubin: 0.6 mg/dL (ref 0.2–1.2)
TOTAL PROTEIN: 7.4 g/dL (ref 6.0–8.3)
Total Bilirubin: 0.7 mg/dL (ref 0.2–1.2)

## 2013-07-27 LAB — HEMOGLOBIN A1C
HEMOGLOBIN A1C: 5.9 % — AB (ref <5.7)
MEAN PLASMA GLUCOSE: 123 mg/dL — AB (ref <117)

## 2013-07-27 LAB — BASIC METABOLIC PANEL WITH GFR
BUN: 14 mg/dL (ref 6–23)
CALCIUM: 10.3 mg/dL (ref 8.4–10.5)
CO2: 29 mEq/L (ref 19–32)
Chloride: 99 mEq/L (ref 96–112)
Creat: 1.01 mg/dL (ref 0.50–1.35)
GFR, Est African American: >89 mL/min
GFR, Est Non African American: 79 mL/min
GLUCOSE: 82 mg/dL (ref 70–99)
Potassium: 4.1 mEq/L (ref 3.5–5.3)
SODIUM: 142 meq/L (ref 135–145)

## 2013-07-27 LAB — MAGNESIUM: MAGNESIUM: 1.8 mg/dL (ref 1.5–2.5)

## 2013-07-27 LAB — TESTOSTERONE: Testosterone: 1131.81 ng/dL — ABNORMAL HIGH (ref 300–890)

## 2013-07-27 LAB — VITAMIN B12: Vitamin B-12: 862 pg/mL (ref 211–911)

## 2013-07-27 LAB — CK: Total CK: 554 U/L — ABNORMAL HIGH (ref 7–232)

## 2013-07-27 LAB — PSA: PSA: 0.81 ng/mL (ref <=4.00)

## 2013-07-27 LAB — SEDIMENTATION RATE: SED RATE: 1 mm/h (ref 0–16)

## 2013-07-27 LAB — INSULIN, FASTING: Insulin fasting, serum: 22 u[IU]/mL (ref 3–28)

## 2013-07-27 LAB — RHEUMATOID FACTOR: Rhuematoid fact SerPl-aCnc: 12 IU/mL (ref <=14)

## 2013-07-27 LAB — TSH: TSH: 3.562 u[IU]/mL (ref 0.350–4.500)

## 2013-07-28 MED ORDER — CLOTRIMAZOLE-BETAMETHASONE 1-0.05 % EX CREA: 1.0000 "application " | TOPICAL_CREAM | Freq: Two times a day (BID) | CUTANEOUS | Status: DC

## 2013-07-29 ENCOUNTER — Other Ambulatory Visit: Payer: Self-pay | Admitting: Emergency Medicine

## 2013-07-29 DIAGNOSIS — E782 Mixed hyperlipidemia: Secondary | ICD-10-CM

## 2013-07-29 LAB — TB SKIN TEST
INDURATION: 0 mm
TB Skin Test: NEGATIVE

## 2013-07-30 ENCOUNTER — Other Ambulatory Visit: Payer: Self-pay | Admitting: *Deleted

## 2013-07-30 DIAGNOSIS — R29898 Other symptoms and signs involving the musculoskeletal system: Secondary | ICD-10-CM

## 2013-08-03 ENCOUNTER — Ambulatory Visit: Payer: Self-pay | Admitting: Physician Assistant

## 2013-08-18 ENCOUNTER — Other Ambulatory Visit: Payer: Self-pay | Admitting: Emergency Medicine

## 2013-08-18 ENCOUNTER — Ambulatory Visit: Payer: BC Managed Care – PPO | Admitting: Cardiovascular Disease

## 2013-08-23 ENCOUNTER — Other Ambulatory Visit (INDEPENDENT_AMBULATORY_CARE_PROVIDER_SITE_OTHER): Payer: BC Managed Care – PPO | Admitting: *Deleted

## 2013-08-23 ENCOUNTER — Ambulatory Visit: Payer: Self-pay | Admitting: Emergency Medicine

## 2013-08-23 DIAGNOSIS — Z1212 Encounter for screening for malignant neoplasm of rectum: Secondary | ICD-10-CM

## 2013-08-23 LAB — POC HEMOCCULT BLD/STL (HOME/3-CARD/SCREEN)
Card #3 Fecal Occult Blood, POC: NEGATIVE
FECAL OCCULT BLD: NEGATIVE
Fecal Occult Blood, POC: NEGATIVE

## 2013-08-26 ENCOUNTER — Encounter (HOSPITAL_COMMUNITY): Payer: BC Managed Care – PPO

## 2013-08-26 ENCOUNTER — Encounter: Payer: Self-pay | Admitting: Surgery

## 2013-08-26 ENCOUNTER — Encounter: Payer: BC Managed Care – PPO | Admitting: Vascular Surgery

## 2013-08-30 ENCOUNTER — Ambulatory Visit (INDEPENDENT_AMBULATORY_CARE_PROVIDER_SITE_OTHER): Payer: BC Managed Care – PPO | Admitting: Surgery

## 2013-08-30 ENCOUNTER — Encounter: Payer: Self-pay | Admitting: Surgery

## 2013-08-30 ENCOUNTER — Ambulatory Visit (HOSPITAL_COMMUNITY)
Admission: RE | Admit: 2013-08-30 | Discharge: 2013-08-30 | Disposition: A | Discharge status: Home / Self Care | Payer: BC Managed Care – PPO | Source: Ambulatory Visit | Attending: Surgery | Admitting: Surgery

## 2013-08-30 VITALS — BP 124/87 | HR 79 | Resp 16 | Ht 68.0 in | Wt 227.8 lb

## 2013-08-30 DIAGNOSIS — M216X9 Other acquired deformities of unspecified foot: Secondary | ICD-10-CM

## 2013-08-30 DIAGNOSIS — M21372 Foot drop, left foot: Secondary | ICD-10-CM

## 2013-08-30 DIAGNOSIS — R29898 Other symptoms and signs involving the musculoskeletal system: Secondary | ICD-10-CM | POA: Insufficient documentation

## 2013-08-30 NOTE — Progress Notes (Signed)
Patient name: [name] [name] MRN: [id number] DOB: 1950-10-20 Sex: male   Referred by: Dr. Melford Aase  Reason for referral:  Chief Complaint  Patient presents with  . New Evaluation    c/o left leg weakness/heavinessand left foor drop for 12 months    HISTORY OF PRESENT ILLNESS: This is a very pleasant 63 year old gentleman who is referred for possible vascular disease in his left leg.  The patient was initially sent to [facility] for an elevation in his CPK.  He has undergone multiple imaging studies.  I do not have a copy of these but the patient reports an abnormal EMG as well as a MRI that showed lower back disease.  The patient has been having a foot drop symptoms for approximately one year.  He does have a potential positional explanation with compression of his peroneal nerve from his chair at work.  He has tried changing his positioning which has helped somewhat.  He has had episodes where his leg and goes numb.  He is here to rule out arterial or venous pathology as a etiology.  The patient has been diagnosed as a prediabetic.  He is medically managed for hypertension.  He is a former smoker.  Past Medical History  Diagnosis Date  . Cardiomegaly   . Mixed hyperlipidemia   . Hypertension   . Hypogonadism male   . Sleep apnea   . Obesity   . Colon polyp   . Gallstones   . Pancreas (digestive gland) works poorly   . Prediabetes   . Vitamin D deficiency   . Fatty liver disease, nonalcoholic     Past Surgical History  Procedure Laterality Date  . Cholecystectomy  1976  . Appendectomy  1964  . Ankle fracture surgery Left 1986  . Carpal tunnel release Bilateral 1996    History   Social History  . Marital Status: Married    Spouse Name: N/A    Number of Children: 2  . Years of Education: N/A   Occupational History  . Draftsman    Social History Main Topics  . Smoking status: Former Smoker -- 2.00 packs/day for 7 years    Types: Cigarettes    Quit date:  05/27/1974  . Smokeless tobacco: Never Used  . Alcohol Use: No  . Drug Use: No  . Sexual Activity: Not on file   Other Topics Concern  . Not on file   Social History Narrative  . No narrative on file    Family History  Problem Relation Age of Onset  . Diabetes Mother   . Diabetes Father     brother and sister  . Heart disease Father   . Hypertension Father   . Diabetes Sister     Allergies as of 08/30/2013 - Review Complete 08/30/2013  Allergen Reaction Noted  . Ace inhibitors  05/04/2013  . Bee venom  05/04/2013    Current Outpatient Prescriptions on File Prior to Visit  Medication Sig Dispense Refill  . Ascorbic Acid (VITAMIN C) 1000 MG tablet Take 1,000 mg by mouth daily.        Marland Kitchen aspirin 325 MG tablet Take 325 mg by mouth daily.        . B Complex Vitamins (B COMPLEX PO) Take by mouth daily.      . Cholecalciferol (VITAMIN D PO) Take 6,000 Int'l Units by mouth daily.      . clotrimazole-betamethasone (LOTRISONE) cream Apply 1 application topically 2 (two) times daily. APPLY  TO FOOT RASH only  45 g  0  . fish oil-omega-3 fatty acids 1000 MG capsule Take 2 g by mouth daily.       . hydrochlorothiazide (HYDRODIURIL) 25 MG tablet Take 12.5 mg by mouth daily.       Marland Kitchen levothyroxine (SYNTHROID, LEVOTHROID) 50 MCG tablet TAKE ONE TABLET BY MOUTH EVERY DAY  90 tablet  0  . losartan (COZAAR) 100 MG tablet 1 tablet daily.      Marland Kitchen MAGNESIUM PO Take 650 mg by mouth daily.       . metoprolol succinate (TOPROL-XL) 25 MG 24 hr tablet Take 25 mg by mouth daily.        . Multiple Vitamin (MULTIVITAMIN) capsule Take 1 capsule by mouth daily.        . sertraline (ZOLOFT) 100 MG tablet Take 1 tablet (100 mg total) by mouth daily.  90 tablet  1  . tadalafil (CIALIS) 5 MG tablet Take 1 tablet (5 mg total) by mouth as needed for erectile dysfunction.  90 tablet  1  . testosterone cypionate (DEPOTESTOTERONE CYPIONATE) 200 MG/ML injection Inject 2 mLs (400 mg total) into the muscle every 14  (fourteen) days. And needs 3 cc syringes and 1"  21  G needles  10 mL  5  . levothyroxine (SYNTHROID, LEVOTHROID) 25 MCG tablet Take 50 mcg by mouth daily.       Marland Kitchen pyridOXINE (VITAMIN B-6) 50 MG tablet Take 50 mg by mouth daily.         No current facility-administered medications on file prior to visit.     REVIEW OF SYSTEMS: Cardiovascular: No chest pain, chest pressure, palpitations, orthopnea, or dyspnea on exertion. No claudication or rest pain, positive for pain in legs when lying flat Pulmonary: No productive cough, asthma or wheezing. Neurologic: Positive for left leg numbness Hematologic: No bleeding problems or clotting disorders. Musculoskeletal: No joint pain or joint swelling. Gastrointestinal: No blood in stool or hematemesis Genitourinary: No dysuria or hematuria. Psychiatric:: No history of major depression. Integumentary: No rashes or ulcers. Constitutional: No fever or chills.  PHYSICAL EXAMINATION: General: The patient appears their stated age.  Vital signs are BP 124/87  Pulse 79  Resp 16  Ht 5\' 8"  (1.727 m)  Wt 227 lb 12.8 oz (103.329 kg)  BMI 34.64 kg/m2 HEENT:  No gross abnormalities Pulmonary: Respirations are non-labored Abdomen: Soft and non-tender  Musculoskeletal: There are no major deformities.   Neurologic: No focal weakness or paresthesias are detected, Skin: There are no ulcer or rashes noted. Psychiatric: The patient has normal affect. Cardiovascular: There is a regular rate and rhythm without significant murmur appreciated.  Palpable pedal pulses bilaterally  Diagnostic Studies: Venous ultrasound was ordered and reviewed.  There is no evidence of deep vein thrombus or obstruction.  The patient does have deep vein reflux as well as superficial vein reflux.   Assessment:  Left leg foot drop Plan: I discussed the ultrasound findings with the patient.  Although this was positive for a venous insufficiency in the left leg, the patient is  completely asymptomatic.  He will occasionally get trace edema, for which I have advised him to wear compression stockings.  He does not have prominent varicose veins.  The patient has palpable pedal pulses on physical examination.  I discussed with the patient that I do not feel his existing symptoms are secondary to arterial or venous pathology.  I advised him to be conscious of how he sits and/or crosses his  legs, as this could be causing compression on his peroneal nerve.  He will contact me should he develop significant swelling or painful varicosities in his left leg.  Otherwise I will see him on an as-needed basis.     Eldridge Abrahams, M.D. [facility] Office: [phone] Pager:  [phone]

## 2013-09-06 ENCOUNTER — Ambulatory Visit (INDEPENDENT_AMBULATORY_CARE_PROVIDER_SITE_OTHER): Payer: BC Managed Care – PPO | Admitting: Emergency Medicine

## 2013-09-06 ENCOUNTER — Encounter: Payer: Self-pay | Admitting: Emergency Medicine

## 2013-09-06 VITALS — BP 118/80 | HR 74 | Temp 98.0°F | Resp 18 | Ht 67.5 in | Wt 232.0 lb

## 2013-09-06 DIAGNOSIS — J309 Allergic rhinitis, unspecified: Secondary | ICD-10-CM

## 2013-09-06 DIAGNOSIS — R6889 Other general symptoms and signs: Secondary | ICD-10-CM

## 2013-09-06 LAB — CBC WITH DIFFERENTIAL/PLATELET
BASOS PCT: 1 % (ref 0–1)
Basophils Absolute: 0 10*3/uL (ref 0.0–0.1)
Eosinophils Absolute: 0.2 10*3/uL (ref 0.0–0.7)
Eosinophils Relative: 5 % (ref 0–5)
HCT: 50.2 % (ref 39.0–52.0)
Hemoglobin: 17.4 g/dL — ABNORMAL HIGH (ref 13.0–17.0)
Lymphocytes Relative: 23 % (ref 12–46)
Lymphs Abs: 0.9 10*3/uL (ref 0.7–4.0)
MCH: 29.9 pg (ref 26.0–34.0)
MCHC: 34.7 g/dL (ref 30.0–36.0)
MCV: 86.4 fL (ref 78.0–100.0)
Monocytes Absolute: 0.5 10*3/uL (ref 0.1–1.0)
Monocytes Relative: 12 % (ref 3–12)
NEUTROS PCT: 59 % (ref 43–77)
Neutro Abs: 2.4 10*3/uL (ref 1.7–7.7)
PLATELETS: 179 10*3/uL (ref 150–400)
RBC: 5.81 MIL/uL (ref 4.22–5.81)
RDW: 14.8 % (ref 11.5–15.5)
WBC: 4 10*3/uL (ref 4.0–10.5)

## 2013-09-06 LAB — CK: Total CK: 550 U/L — ABNORMAL HIGH (ref 7–232)

## 2013-09-06 NOTE — Progress Notes (Signed)
Subjective:    Patient ID: [name] [name], male    DOB: 04/29/1951, 63 y.o.   MRN: [id number]  HPI Comments: 63 yo male for f/u with abnormal labs with history of testosterone. He notes he feels okay and is trying to improve diet/ exercise. He has held testosterone for 4 weeks AD.   He has had recent URI type infection. He notes cough off white and thick. He quit tobacco in 1976.  He notes he does not want to see Dr Johnsie Cancel any further due to his attitude and lack of concern for his symptoms. He denies any chest pain.  He is still having some left leg weakness despite exercise, modification of chair and negative vascular exam. He has established with Dr Mosetta Pigeon at [facility] and will call if desires further evaluation.  Current Outpatient Prescriptions on File Prior to Visit  Medication Sig Dispense Refill  . Ascorbic Acid (VITAMIN C) 1000 MG tablet Take 1,000 mg by mouth daily.        Marland Kitchen aspirin 325 MG tablet Take 325 mg by mouth daily.        . B Complex Vitamins (B COMPLEX PO) Take by mouth. Takes M,W,F      . Cholecalciferol (VITAMIN D PO) Take 6,000 Int'l Units by mouth daily.      . clotrimazole-betamethasone (LOTRISONE) cream Apply 1 application topically 2 (two) times daily. APPLY TO FOOT RASH only  45 g  0  . fish oil-omega-3 fatty acids 1000 MG capsule Take 2 g by mouth 3 (three) times daily.       . hydrochlorothiazide (HYDRODIURIL) 25 MG tablet Take 12.5 mg by mouth daily.       Marland Kitchen levothyroxine (SYNTHROID, LEVOTHROID) 25 MCG tablet Take 50 mcg by mouth daily.       Marland Kitchen levothyroxine (SYNTHROID, LEVOTHROID) 50 MCG tablet TAKE ONE TABLET BY MOUTH EVERY DAY  90 tablet  0  . losartan (COZAAR) 100 MG tablet 1 tablet daily.      Marland Kitchen MAGNESIUM PO Take 650 mg by mouth daily.       . metoprolol succinate (TOPROL-XL) 25 MG 24 hr tablet Take 25 mg by mouth 2 (two) times daily.       . Multiple Vitamin (MULTIVITAMIN) capsule Take 1 capsule by mouth daily.        Marland Kitchen pyridOXINE (VITAMIN B-6) 50 MG tablet  Take 50 mg by mouth daily.        . sertraline (ZOLOFT) 100 MG tablet Take 1 tablet (100 mg total) by mouth daily.  90 tablet  1  . tadalafil (CIALIS) 5 MG tablet Take 1 tablet (5 mg total) by mouth as needed for erectile dysfunction.  90 tablet  1  . testosterone cypionate (DEPOTESTOTERONE CYPIONATE) 200 MG/ML injection Inject 2 mLs (400 mg total) into the muscle every 14 (fourteen) days. And needs 3 cc syringes and 1"  21  G needles  10 mL  5   No current facility-administered medications on file prior to visit.   Allergies  Allergen Reactions  . Ace Inhibitors     cough  . Bee Venom    Past Medical History  Diagnosis Date  . Cardiomegaly   . Mixed hyperlipidemia   . Hypertension   . Hypogonadism male   . Sleep apnea   . Obesity   . Colon polyp   . Gallstones   . Pancreas (digestive gland) works poorly   . Prediabetes   . Vitamin D deficiency   .  Fatty liver disease, nonalcoholic       Review of Systems  HENT: Positive for postnasal drip.   Respiratory: Positive for cough.   All other systems reviewed and are negative.  BP 118/80  Pulse 74  Temp(Src) 98 F (36.7 C) (Temporal)  Resp 18  Ht 5' 7.5" (1.715 m)  Wt 232 lb (105.235 kg)  BMI 35.78 kg/m2     Objective:   Physical Exam  Nursing note and vitals reviewed. Constitutional: He is oriented to person, place, and time. He appears well-developed and well-nourished.  HENT:  Head: Normocephalic and atraumatic.  Right Ear: External ear normal.  Left Ear: External ear normal.  Nose: Nose normal.  Mouth/Throat: Oropharynx is clear and moist. No oropharyngeal exudate.  Cloudy TM's bilaterally   Eyes: Conjunctivae are normal.  Neck: Normal range of motion.  Cardiovascular: Normal rate, regular rhythm, normal heart sounds and intact distal pulses.   Pulmonary/Chest: Effort normal and breath sounds normal.  Congested Breath sounds, clears some with cough  Abdominal: Soft.  Musculoskeletal: Normal range of  motion.  Lymphadenopathy:    He has no cervical adenopathy.  Neurological: He is alert and oriented to person, place, and time.  Skin: Skin is warm and dry.  Psychiatric: He has a normal mood and affect. Judgment normal.          Assessment & Plan:  1. Abnormal lab- Recheck labs with holding of testosterone x 4 weeks to see if any change. Fax labs [place] when complete  2. Cough/ Allergic rhinitis- Allegra OTC, increase H2o, allergy hygiene explained. Can also try OTC Flonase AD, check CBC  3. Leg weakness with HX of Deg Back changes- Advised f/u Neuro [facility] if no improvement

## 2013-09-06 NOTE — Patient Instructions (Signed)
Allergic Rhinitis Claritin or Zyrtec Allergic rhinitis is when the mucous membranes in the nose respond to allergens. Allergens are particles in the air that cause your body to have an allergic reaction. This causes you to release allergic antibodies. Through a chain of events, these eventually cause you to release histamine into the blood stream. Although meant to protect the body, it is this release of histamine that causes your discomfort, such as frequent sneezing, congestion, and an itchy, runny nose.  CAUSES  Seasonal allergic rhinitis (hay fever) is caused by pollen allergens that may come from grasses, trees, and weeds. Year-round allergic rhinitis (perennial allergic rhinitis) is caused by allergens such as house dust mites, pet dander, and mold spores.  SYMPTOMS   Nasal stuffiness (congestion).  Itchy, runny nose with sneezing and tearing of the eyes. DIAGNOSIS  Your health care provider can help you determine the allergen or allergens that trigger your symptoms. If you and your health care provider are unable to determine the allergen, skin or blood testing may be used. TREATMENT  Allergic Rhinitis does not have a cure, but it can be controlled by:  Medicines and allergy shots (immunotherapy).  Avoiding the allergen. Hay fever may often be treated with antihistamines in pill or nasal spray forms. Antihistamines block the effects of histamine. There are over-the-counter medicines that may help with nasal congestion and swelling around the eyes. Check with your health care provider before taking or giving this medicine.  If avoiding the allergen or the medicine prescribed do not work, there are many new medicines your health care provider can prescribe. Stronger medicine may be used if initial measures are ineffective. Desensitizing injections can be used if medicine and avoidance does not work. Desensitization is when a patient is given ongoing shots until the body becomes less sensitive  to the allergen. Make sure you follow up with your health care provider if problems continue. HOME CARE INSTRUCTIONS It is not possible to completely avoid allergens, but you can reduce your symptoms by taking steps to limit your exposure to them. It helps to know exactly what you are allergic to so that you can avoid your specific triggers. SEEK MEDICAL CARE IF:   You have a fever.  You develop a cough that does not stop easily (persistent).  You have shortness of breath.  You start wheezing.  Symptoms interfere with normal daily activities. Document Released: 02/05/2001 Document Revised: 03/03/2013 Document Reviewed: 01/18/2013 [facility] Patient Information 2014 [place].

## 2013-09-07 LAB — TESTOSTERONE: TESTOSTERONE: 160 ng/dL — AB (ref 300–890)

## 2013-11-07 ENCOUNTER — Other Ambulatory Visit: Payer: Self-pay | Admitting: Physician Assistant

## 2013-11-22 ENCOUNTER — Encounter: Payer: Self-pay | Admitting: Emergency Medicine

## 2013-11-22 ENCOUNTER — Ambulatory Visit (INDEPENDENT_AMBULATORY_CARE_PROVIDER_SITE_OTHER): Payer: BC Managed Care – PPO | Admitting: Emergency Medicine

## 2013-11-22 ENCOUNTER — Other Ambulatory Visit: Payer: Self-pay | Admitting: Emergency Medicine

## 2013-11-22 VITALS — BP 118/86 | HR 64 | Temp 97.7°F | Resp 18 | Ht 67.5 in | Wt 233.4 lb

## 2013-11-22 DIAGNOSIS — I10 Essential (primary) hypertension: Secondary | ICD-10-CM

## 2013-11-22 DIAGNOSIS — R05 Cough: Secondary | ICD-10-CM

## 2013-11-22 DIAGNOSIS — R059 Cough, unspecified: Secondary | ICD-10-CM

## 2013-11-22 DIAGNOSIS — R6889 Other general symptoms and signs: Secondary | ICD-10-CM

## 2013-11-22 DIAGNOSIS — R7309 Other abnormal glucose: Secondary | ICD-10-CM

## 2013-11-22 DIAGNOSIS — E782 Mixed hyperlipidemia: Secondary | ICD-10-CM

## 2013-11-22 DIAGNOSIS — E039 Hypothyroidism, unspecified: Secondary | ICD-10-CM

## 2013-11-22 LAB — CBC WITH DIFFERENTIAL/PLATELET
BASOS ABS: 0 10*3/uL (ref 0.0–0.1)
Basophils Relative: 1 % (ref 0–1)
Eosinophils Absolute: 0.2 10*3/uL (ref 0.0–0.7)
Eosinophils Relative: 5 % (ref 0–5)
HCT: 44.5 % (ref 39.0–52.0)
Hemoglobin: 15.6 g/dL (ref 13.0–17.0)
LYMPHS PCT: 29 % (ref 12–46)
Lymphs Abs: 1.3 10*3/uL (ref 0.7–4.0)
MCH: 31 pg (ref 26.0–34.0)
MCHC: 35.1 g/dL (ref 30.0–36.0)
MCV: 88.5 fL (ref 78.0–100.0)
Monocytes Absolute: 0.6 10*3/uL (ref 0.1–1.0)
Monocytes Relative: 13 % — ABNORMAL HIGH (ref 3–12)
NEUTROS PCT: 52 % (ref 43–77)
Neutro Abs: 2.3 10*3/uL (ref 1.7–7.7)
PLATELETS: 247 10*3/uL (ref 150–400)
RBC: 5.03 MIL/uL (ref 4.22–5.81)
RDW: 16 % — ABNORMAL HIGH (ref 11.5–15.5)
WBC: 4.5 10*3/uL (ref 4.0–10.5)

## 2013-11-22 LAB — HEMOGLOBIN A1C
HEMOGLOBIN A1C: 5.9 % — AB (ref <5.7)
MEAN PLASMA GLUCOSE: 123 mg/dL — AB (ref <117)

## 2013-11-22 NOTE — Patient Instructions (Signed)
Food Choices for Gastroesophageal Reflux Disease  Trial of Claritin or Zyrtec 1 daily X 2 weeks, if no change with allergy medicine then trial of Nexium 20 mg daily x 2 weeks,  If no change refer to GI, Patient will call with results.    When you have gastroesophageal reflux disease (GERD), the foods you eat and your eating habits are very important. Choosing the right foods can help ease your discomfort.  WHAT GUIDELINES DO I NEED TO FOLLOW?   Choose fruits, vegetables, whole grains, and low-fat dairy products.   Choose low-fat meat, fish, and poultry.  Limit fats such as oils, salad dressings, butter, nuts, and avocado.   Keep a food diary. This helps you identify foods that cause symptoms.   Avoid foods that cause symptoms. These may be different for everyone.   Eat small meals often instead of 3 large meals a day.   Eat your meals slowly, in a place where you are relaxed.   Limit fried foods.   Cook foods using methods other than frying.   Avoid drinking alcohol.   Avoid drinking large amounts of liquids with your meals.   Avoid bending over or lying down until 2-3 hours after eating.  WHAT FOODS ARE NOT RECOMMENDED?  These are some foods and drinks that may make your symptoms worse: Vegetables Tomatoes. Tomato juice. Tomato and spaghetti sauce. Chili peppers. Onion and garlic. Horseradish. Fruits Oranges, grapefruit, and lemon (fruit and juice). Meats High-fat meats, fish, and poultry. This includes hot dogs, ribs, ham, sausage, salami, and bacon. Dairy Whole milk and chocolate milk. Sour cream. Cream. Butter. Ice cream. Cream cheese.  Drinks Coffee and tea. Bubbly (carbonated) drinks or energy drinks. Condiments Hot sauce. Barbecue sauce.  Sweets/Desserts Chocolate and cocoa. Donuts. Peppermint and spearmint. Fats and Oils High-fat foods. This includes [place] fries and potato chips. Other Vinegar. Strong spices. This includes black pepper, white  pepper, red pepper, cayenne, curry powder, cloves, ginger, and chili powder. The items listed above may not be a complete list of foods and drinks to avoid. Contact your dietitian for more information. Document Released: 11/12/2011 Document Revised: 05/18/2013 Document Reviewed: 03/17/2013 [facility] Patient Information 2015 [place], [place]. This information is not intended to replace advice given to you by your health care provider. Make sure you discuss any questions you have with your health care provider.  Allergic Rhinitis Allergic rhinitis is when the mucous membranes in the nose respond to allergens. Allergens are particles in the air that cause your body to have an allergic reaction. This causes you to release allergic antibodies. Through a chain of events, these eventually cause you to release histamine into the blood stream. Although meant to protect the body, it is this release of histamine that causes your discomfort, such as frequent sneezing, congestion, and an itchy, runny nose.  CAUSES  Seasonal allergic rhinitis (hay fever) is caused by pollen allergens that may come from grasses, trees, and weeds. Year-round allergic rhinitis (perennial allergic rhinitis) is caused by allergens such as house dust mites, pet dander, and mold spores.  SYMPTOMS   Nasal stuffiness (congestion).  Itchy, runny nose with sneezing and tearing of the eyes. DIAGNOSIS  Your health care provider can help you determine the allergen or allergens that trigger your symptoms. If you and your health care provider are unable to determine the allergen, skin or blood testing may be used. TREATMENT  Allergic rhinitis does not have a cure, but it can be controlled by:  Medicines and  allergy shots (immunotherapy).  Avoiding the allergen. Hay fever may often be treated with antihistamines in pill or nasal spray forms. Antihistamines block the effects of histamine. There are over-the-counter medicines that may help with  nasal congestion and swelling around the eyes. Check with your health care provider before taking or giving this medicine.  If avoiding the allergen or the medicine prescribed do not work, there are many new medicines your health care provider can prescribe. Stronger medicine may be used if initial measures are ineffective. Desensitizing injections can be used if medicine and avoidance does not work. Desensitization is when a patient is given ongoing shots until the body becomes less sensitive to the allergen. Make sure you follow up with your health care provider if problems continue. HOME CARE INSTRUCTIONS It is not possible to completely avoid allergens, but you can reduce your symptoms by taking steps to limit your exposure to them. It helps to know exactly what you are allergic to so that you can avoid your specific triggers. SEEK MEDICAL CARE IF:   You have a fever.  You develop a cough that does not stop easily (persistent).  You have shortness of breath.  You start wheezing.  Symptoms interfere with normal daily activities. Document Released: 02/05/2001 Document Revised: 05/18/2013 Document Reviewed: 01/18/2013 [facility] Patient Information 2015 [place], [place]. This information is not intended to replace advice given to you by your health care provider. Make sure you discuss any questions you have with your health care provider.

## 2013-11-22 NOTE — Progress Notes (Signed)
Subjective:    Patient ID: [name] [name], male    DOB: [birth year], 63 y.o.   MRN: [id number]  HPI Comments: 63 yo WM presents for 3 month F/U for HTN, Cholesterol, Pre-Dm, D. Deficient. He has been off testosterone for several months with elevated muscle enzymes. He is being followed by [facility]. He is staying more active and losing some weight. He is not checking BP. He is eating better and trying to decrease portions.   He has had cough since April. He had neg CXR 2104. He denies production with cough. He notes dry harsh cough. He denies reflux/ allergy drainage.   WBC             4.0   09/06/2013 HGB            17.4   09/06/2013 HCT            50.2   09/06/2013 PLT             179   09/06/2013 GLUCOSE          82   07/26/2013 CHOL            226   07/26/2013 TRIG            343   07/26/2013 HDL              37   07/26/2013 LDLCALC         120   07/26/2013 ALT              50   07/26/2013 AST              38   07/26/2013 NA              142   07/26/2013 K               4.1   07/26/2013 CL               99   07/26/2013 CREATININE     1.01   07/26/2013 BUN              14   07/26/2013 CO2              29   07/26/2013 TSH           3.562   07/26/2013 PSA            0.81   07/26/2013 INR             1.0   10/01/2011 HGBA1C          5.9   07/26/2013 MICROALBUR     3.13   07/26/2013   Cough      Medication List       This list is accurate as of: 11/22/13  9:49 AM.  Always use your most recent med list.               aspirin 325 MG tablet  Take 325 mg by mouth daily.     B COMPLEX PO  Take by mouth. Takes M,W,F     clotrimazole-betamethasone cream  Commonly known as:  LOTRISONE  Apply 1 application topically 2 (two) times daily. APPLY TO FOOT RASH only     fish oil-omega-3 fatty acids 1000 MG capsule  Take 2 g by mouth 3 (three) times daily.     hydrochlorothiazide 25 MG tablet  Commonly known as:  HYDRODIURIL  Take 12.5 mg by mouth  daily.     levothyroxine 25 MCG tablet  Commonly known as:   SYNTHROID, LEVOTHROID  Take 50 mcg by mouth daily.     levothyroxine 50 MCG tablet  Commonly known as:  SYNTHROID, LEVOTHROID  TAKE ONE TABLET BY MOUTH EVERY DAY     losartan 100 MG tablet  Commonly known as:  COZAAR  1 tablet daily.     MAGNESIUM PO  Take 650 mg by mouth daily.     metoprolol succinate 25 MG 24 hr tablet  Commonly known as:  TOPROL-XL  Take 25 mg by mouth 2 (two) times daily.     multivitamin capsule  Take 1 capsule by mouth daily.     pyridOXINE 50 MG tablet  Commonly known as:  VITAMIN B-6  Take 50 mg by mouth daily.     sertraline 100 MG tablet  Commonly known as:  ZOLOFT  TAKE 1 TABLET DAILY     tadalafil 5 MG tablet  Commonly known as:  CIALIS  Take 1 tablet (5 mg total) by mouth as needed for erectile dysfunction.     vitamin C 1000 MG tablet  Take 1,000 mg by mouth daily.     VITAMIN D PO  Take 6,000 Int'l Units by mouth daily.       Allergies  Allergen Reactions  . Ace Inhibitors     cough  . Bee Venom    Past Medical History  Diagnosis Date  . Cardiomegaly   . Mixed hyperlipidemia   . Hypertension   . Hypogonadism male   . Sleep apnea   . Obesity   . Colon polyp   . Gallstones   . Pancreas (digestive gland) works poorly   . Prediabetes   . Vitamin D deficiency   . Fatty liver disease, nonalcoholic      Review of Systems  Respiratory: Positive for cough.   All other systems reviewed and are negative.  BP 118/86  Pulse 64  Temp(Src) 97.7 F (36.5 C)  Resp 18  Ht 5' 7.5" (1.715 m)  Wt 233 lb 6.4 oz (105.87 kg)  BMI 36.00 kg/m2     Objective:   Physical Exam  Nursing note and vitals reviewed. Constitutional: He is oriented to person, place, and time. He appears well-developed and well-nourished.  obese  HENT:  Head: Normocephalic and atraumatic.  Right Ear: External ear normal.  Left Ear: External ear normal.  Nose: Nose normal.  Raspy voice chronic  Eyes: Conjunctivae and EOM are normal.  Neck: Normal  range of motion. Neck supple. No JVD present. No thyromegaly present.  Cardiovascular: Normal rate, regular rhythm, normal heart sounds and intact distal pulses.   Pulmonary/Chest: Effort normal and breath sounds normal.  Abdominal: Soft. Bowel sounds are normal. He exhibits no distension. There is no tenderness.  Musculoskeletal: Normal range of motion. He exhibits no edema and no tenderness.  Lymphadenopathy:    He has no cervical adenopathy.  Neurological: He is alert and oriented to person, place, and time. He has normal reflexes. No cranial nerve deficit. Coordination normal.  Skin: Skin is warm and dry.  Psychiatric: He has a normal mood and affect. His behavior is normal. Judgment and thought content normal.          Assessment & Plan:  1.  3 month F/U for HTN, Cholesterol, Pre-Dm, D. Deficient. Needs healthy diet, cardio QD and obtain healthy weight. Check Labs, Check BP if >130/80 call office   2. Cough? Allergy vs reflux-  Trial of Claritin or Zyrtec 1 daily X 2 weeks, if no change with allergy medicine then trial of Nexium 20 mg daily x 2 weeks,  If no change refer to GI, Patient will call with results.   3. Abnormal muscle labs- needs to keep f/u at [facility]

## 2013-11-23 ENCOUNTER — Other Ambulatory Visit: Payer: Self-pay | Admitting: Emergency Medicine

## 2013-11-23 ENCOUNTER — Telehealth: Payer: Self-pay

## 2013-11-23 LAB — HEPATIC FUNCTION PANEL
ALT: 42 U/L (ref 0–53)
AST: 34 U/L (ref 0–37)
Albumin: 4.7 g/dL (ref 3.5–5.2)
Alkaline Phosphatase: 49 U/L (ref 39–117)
BILIRUBIN DIRECT: 0.1 mg/dL (ref 0.0–0.3)
Indirect Bilirubin: 0.5 mg/dL (ref 0.2–1.2)
Total Bilirubin: 0.6 mg/dL (ref 0.2–1.2)
Total Protein: 7.2 g/dL (ref 6.0–8.3)

## 2013-11-23 LAB — BASIC METABOLIC PANEL WITH GFR
BUN: 19 mg/dL (ref 6–23)
CHLORIDE: 100 meq/L (ref 96–112)
CO2: 30 mEq/L (ref 19–32)
Calcium: 9.9 mg/dL (ref 8.4–10.5)
Creat: 0.93 mg/dL (ref 0.50–1.35)
GFR, Est African American: >89 mL/min
GFR, Est Non African American: 88 mL/min
Glucose, Bld: 94 mg/dL (ref 70–99)
POTASSIUM: 4.8 meq/L (ref 3.5–5.3)
SODIUM: 141 meq/L (ref 135–145)

## 2013-11-23 LAB — LIPID PANEL
CHOLESTEROL: 237 mg/dL — AB (ref 0–200)
HDL: 42 mg/dL (ref >39)
Total CHOL/HDL Ratio: 5.6 Ratio
Triglycerides: 421 mg/dL — ABNORMAL HIGH (ref <150)

## 2013-11-23 LAB — CK: CK TOTAL: 386 U/L — AB (ref 7–232)

## 2013-11-23 LAB — INSULIN, FASTING: Insulin fasting, serum: 22 u[IU]/mL (ref 3–28)

## 2013-11-23 LAB — TSH: TSH: 1.132 u[IU]/mL (ref 0.350–4.500)

## 2013-11-23 NOTE — Telephone Encounter (Signed)
Left message for patient to return call for lab results. 

## 2013-11-23 NOTE — Telephone Encounter (Signed)
Message copied by Nadyne Coombes on Tue Nov 23, 2013 11:04 AM ------      Message from: Kelby Aline R      Created: Tue Nov 23, 2013  6:52 AM       A1c (3 month blood sugar average) elevated need decreased Carbohydrates, increase Cardio, and maintain healthy weight. Triglycerides elevated decrease Carbohydrates including alcohol and fried foods. Add Fish oil 1000mg . Cholesterol is not in range, need to eat healthy diet, exercise daily, and lose weight. Need to restart cholesterol RX with liver improved and lipids horrible.  Increased risk for heart attack/ stroke/ blindness/ kidney failure/ cancer and many more complications with horrible labs. Try zetia 1 QD, we will call when we have samples/ copay card. Needs recheck 4 weeks, HFP/lipid/ cpk 272.2 /796.4 ------

## 2013-11-28 ENCOUNTER — Other Ambulatory Visit: Payer: Self-pay | Admitting: Physician Assistant

## 2013-12-02 ENCOUNTER — Ambulatory Visit (INDEPENDENT_AMBULATORY_CARE_PROVIDER_SITE_OTHER): Payer: BC Managed Care – PPO

## 2013-12-02 DIAGNOSIS — Z23 Encounter for immunization: Secondary | ICD-10-CM

## 2013-12-02 NOTE — Progress Notes (Signed)
Patient ID: [name] [name], male   DOB: 1951/02/25, 64 y.o.   MRN: [id number] Patient here today to receive Zostavax vaccine.

## 2013-12-21 ENCOUNTER — Other Ambulatory Visit: Payer: Self-pay | Admitting: *Deleted

## 2013-12-21 MED ORDER — EZETIMIBE 10 MG PO TABS: 10.0000 mg | ORAL_TABLET | Freq: Every day | ORAL | Status: DC

## 2013-12-26 ENCOUNTER — Other Ambulatory Visit: Payer: Self-pay | Admitting: Internal Medicine

## 2013-12-31 ENCOUNTER — Encounter: Payer: Self-pay | Admitting: Internal Medicine

## 2013-12-31 ENCOUNTER — Ambulatory Visit (INDEPENDENT_AMBULATORY_CARE_PROVIDER_SITE_OTHER): Payer: BC Managed Care – PPO | Admitting: Internal Medicine

## 2013-12-31 VITALS — BP 124/80 | HR 64 | Temp 97.9°F | Resp 16 | Ht 67.5 in | Wt 237.2 lb

## 2013-12-31 DIAGNOSIS — IMO0001 Reserved for inherently not codable concepts without codable children: Secondary | ICD-10-CM

## 2013-12-31 DIAGNOSIS — E039 Hypothyroidism, unspecified: Secondary | ICD-10-CM | POA: Insufficient documentation

## 2013-12-31 DIAGNOSIS — Z79899 Other long term (current) drug therapy: Secondary | ICD-10-CM

## 2013-12-31 DIAGNOSIS — I10 Essential (primary) hypertension: Secondary | ICD-10-CM

## 2013-12-31 DIAGNOSIS — R7303 Prediabetes: Secondary | ICD-10-CM

## 2013-12-31 DIAGNOSIS — E559 Vitamin D deficiency, unspecified: Secondary | ICD-10-CM

## 2013-12-31 DIAGNOSIS — M609 Myositis, unspecified: Secondary | ICD-10-CM | POA: Insufficient documentation

## 2013-12-31 DIAGNOSIS — R7309 Other abnormal glucose: Secondary | ICD-10-CM

## 2013-12-31 DIAGNOSIS — E782 Mixed hyperlipidemia: Secondary | ICD-10-CM

## 2013-12-31 NOTE — Progress Notes (Signed)
Patient ID: [name] [name], male   DOB: 1951-05-23, 63 y.o.   MRN: [id number]   This very nice 63 y.o.MWM presents for 4 week  follow up on Zetia & to also recheck CPK . Patient had elevated CPK's in the past attributed to Statins and was seen at [facility] by Neurology and had an unrevealing evaluation. He has been off of Statins since Feb 2015. He also is followed for Hypertension, Hyperlipidemia, Pre-Diabetes and Vitamin D Deficiency.    Patient is treated for HTN & BP has been controlled at home. Today's BP: 124/80 mmHg. Patient denies any cardiac type chest pain, palpitations, dyspnea/orthopnea/PND, dizziness, claudication, or dependent edema.   Hyperlipidemia is not controlled with diet & meds. Patient denies myalgias or other med SE's. Last Lipids off treatment were were Cholesterol, Total 237; HDL  42; LDL - NOT CALC; Triglycerides 421 in 11/22/2013.   Also, the patient has history of Morbid Obesity (BMI 36.6) and consequent PreDiabetes and patient denies any symptoms of reactive hypoglycemia, diabetic polys, paresthesias or visual blurring.  Last A1c was A1c 5.9% on  11/22/2013    Further, Patient has history of Vitamin D Deficiency and patient supplements vitamin D without any suspected side-effects. Last vitamin D was  34 in Dec 2014.    Medication List   aspirin 325 MG tablet  Take 325 mg by mouth daily.     B COMPLEX PO  Take by mouth. Takes M,W,F     clotrimazole-betamethasone cream  Commonly known as:  LOTRISONE  Apply 1 application topically 2 (two) times daily. APPLY TO FOOT RASH only     ezetimibe 10 MG tablet  Commonly known as:  ZETIA  Take 1 tablet (10 mg total) by mouth daily.     fish oil-omega-3 fatty acids 1000 MG capsule  Take 2 g by mouth 3 (three) times daily.     hydrochlorothiazide 25 MG tablet  Commonly known as:  HYDRODIURIL  TAKE ONE TABLET BY MOUTH EVERY MORNING     levothyroxine 50 MCG tablet  Commonly known as:  SYNTHROID, LEVOTHROID  TAKE ONE TABLET BY  MOUTH ONCE DAILY     losartan 100 MG tablet  Commonly known as:  COZAAR  1 tablet daily.     MAGNESIUM PO  Take 650 mg by mouth daily.     metoprolol tartrate 25 MG tablet  Commonly known as:  LOPRESSOR  TAKE ONE TABLET BY MOUTH TWICE DAILY     multivitamin capsule  Take 1 capsule by mouth daily.     pyridOXINE 50 MG tablet  Commonly known as:  VITAMIN B-6  Take 50 mg by mouth daily.     sertraline 100 MG tablet  Commonly known as:  ZOLOFT  TAKE 1 TABLET DAILY     tadalafil 5 MG tablet  Commonly known as:  CIALIS  Take 1 tablet (5 mg total) by mouth as needed for erectile dysfunction.     vitamin C 1000 MG tablet  Take 1,000 mg by mouth daily.     VITAMIN D PO  Take 6,000 Int'l Units by mouth daily.     Allergies  Allergen Reactions  . Ace Inhibitors     cough  . Bee Venom    PMHx:   Past Medical History  Diagnosis Date  . Cardiomegaly   . Mixed hyperlipidemia   . Hypertension   . Hypogonadism male   . Sleep apnea   . Obesity   . Colon polyp   .  Gallstones   . Pancreas (digestive gland) works poorly   . Prediabetes   . Vitamin D deficiency   . Fatty liver disease, nonalcoholic    FHx:    Reviewed / unchanged SHx:    Reviewed / unchanged  Systems Review:  Constitutional: Denies fever, chills, wt changes, headaches, insomnia, fatigue, night sweats, change in appetite. Eyes: Denies redness, blurred vision, diplopia, discharge, itchy, watery eyes.  ENT: Denies discharge, congestion, post nasal drip, epistaxis, sore throat, earache, hearing loss, dental pain, tinnitus, vertigo, sinus pain, snoring.  CV: Denies chest pain, palpitations, irregular heartbeat, syncope, dyspnea, diaphoresis, orthopnea, PND, claudication or edema. Respiratory: denies cough, dyspnea, DOE, pleurisy, hoarseness, laryngitis, wheezing.  Gastrointestinal: Denies dysphagia, odynophagia, heartburn, reflux, water brash, abdominal pain or cramps, nausea, vomiting, bloating, diarrhea,  constipation, hematemesis, melena, hematochezia  or hemorrhoids. Genitourinary: Denies dysuria, frequency, urgency, nocturia, hesitancy, discharge, hematuria or flank pain. Musculoskeletal: Denies arthralgias, myalgias, stiffness, jt. swelling, pain, limping or strain/sprain.  Skin: Denies pruritus, rash, hives, warts, acne, eczema or change in skin lesion(s). Neuro: No weakness, tremor, incoordination, spasms, paresthesia or pain. Psychiatric: Denies confusion, memory loss or sensory loss. Endo: Denies change in weight, skin or hair change.  Heme/Lymph: No excessive bleeding, bruising or enlarged lymph nodes.  Exam:  BP 124/80  Pulse 64  Temp(Src) 97.9 F (36.6 C) (Temporal)  Resp 16  Ht 5' 7.5" (1.715 m)  Wt 237 lb 3.2 oz (107.593 kg)  BMI 36.58 kg/m2  Appears well nourished and in no distress. Eyes: PERRLA, EOMs, conjunctiva no swelling or erythema. Sinuses: No frontal/maxillary tenderness ENT/Mouth: EAC's clear, TM's nl w/o erythema, bulging. Nares clear w/o erythema, swelling, exudates. Oropharynx clear without erythema or exudates. Oral hygiene is good. Tongue normal, non obstructing. Hearing intact.  Neck: Supple. Thyroid nl. Car 2+/2+ without bruits, nodes or JVD. Chest: Respirations nl with BS clear & equal w/o rales, rhonchi, wheezing or stridor.  Cor: Heart sounds normal w/ regular rate and rhythm without sig. murmurs, gallops, clicks, or rubs. Peripheral pulses normal and equal  without edema.  Abdomen: Soft & bowel sounds normal. Non-tender w/o guarding, rebound, hernias, masses, or organomegaly.  Lymphatics: Unremarkable.  Musculoskeletal: Full ROM all peripheral extremities, joint stability, 5/5 strength, and normal gait.  Skin: Warm, dry without exposed rashes, lesions or ecchymosis apparent.  Neuro: Cranial nerves intact, reflexes equal bilaterally. Sensory-motor testing grossly intact. Tendon reflexes grossly intact.  Pysch: Alert & oriented x 3. Insight and  judgement nl & appropriate. No ideations.  Assessment and Plan:  1. Hypertension - Continue monitor blood pressure at home. Continue diet/meds same.  2. Hyperlipidemia - Continue diet/meds, exercise,& lifestyle modifications. Continue monitor periodic cholesterol/liver & renal functions   3. Pre-Diabetes - Continue diet, exercise, lifestyle modifications. Monitor appropriate labs.  4. Vitamin D Deficiency - Continue supplementation.  5. Testosterone Deficiency  6. Myositis   Recommended regular exercise, BP monitoring, weight control, and discussed med and SE's. Recommended labs to assess and monitor clinical status. Further disposition pending results of labs.

## 2013-12-31 NOTE — Patient Instructions (Signed)

## 2014-01-01 LAB — HEPATIC FUNCTION PANEL
ALT: 53 U/L (ref 0–53)
AST: 38 U/L — ABNORMAL HIGH (ref 0–37)
Albumin: 4.7 g/dL (ref 3.5–5.2)
Alkaline Phosphatase: 47 U/L (ref 39–117)
Bilirubin, Direct: 0.1 mg/dL (ref 0.0–0.3)
Indirect Bilirubin: 0.5 mg/dL (ref 0.2–1.2)
TOTAL PROTEIN: 7.1 g/dL (ref 6.0–8.3)
Total Bilirubin: 0.6 mg/dL (ref 0.2–1.2)

## 2014-01-01 LAB — LIPID PANEL
CHOLESTEROL: 186 mg/dL (ref 0–200)
HDL: 39 mg/dL — ABNORMAL LOW (ref >39)
LDL Cholesterol: 70 mg/dL (ref 0–99)
Total CHOL/HDL Ratio: 4.8 Ratio
Triglycerides: 386 mg/dL — ABNORMAL HIGH (ref <150)
VLDL: 77 mg/dL — ABNORMAL HIGH (ref 0–40)

## 2014-01-01 LAB — CK: Total CK: 415 U/L — ABNORMAL HIGH (ref 7–232)

## 2014-01-02 LAB — ALDOLASE: ALDOLASE: 10.2 U/L — AB (ref <=8.1)

## 2014-01-03 ENCOUNTER — Other Ambulatory Visit: Payer: Self-pay | Admitting: *Deleted

## 2014-01-03 MED ORDER — ATORVASTATIN CALCIUM 80 MG PO TABS: 80.0000 mg | ORAL_TABLET | Freq: Every day | ORAL | Status: DC

## 2014-01-09 ENCOUNTER — Other Ambulatory Visit: Payer: Self-pay | Admitting: Internal Medicine

## 2014-02-06 NOTE — Progress Notes (Signed)
Patient ID: [name] [name], male   DOB: Feb 08, 1951, 63 y.o.   MRN: [id number]   This very nice 63 y.o.MWM presents for 6 week  follow up after restarting Atorvastatin alternate day dosing  After evaluation at [facility] for an elevated CPK of obscure etiology. Patient has been off of statins since Feb 2015 w/o change in CPK's nor c/o muscle aches. He is also followed for Hypertension, Hyperlipidemia, Pre-Diabetes and Vitamin D Deficiency.    Patient's BP has been controlled and today's BP: 120/74 mmHg. Patient denies any cardiac type chest pain, palpitations, dyspnea/orthopnea/PND, dizziness, claudication, or dependent edema.   Hypercholesterolemia is controlled with meds (Zetia), but Trig remain elevated and atorvastatin was restarted on 3 x / week schedule. Patient does have Hx/o elevated CPK with no sig change off of statins x 6 mo since Jan 2015 and w/u at [facility] was negative. Patient denies myalgias or other med SE's. Last Lipids were  Lab Results  Component Value Date   CHOL 186 12/31/2013   HDL 39* 12/31/2013   LDLCALC 70 12/31/2013   TRIG 386* 12/31/2013   CHOLHDL 4.8 12/31/2013    Also, the patient has history of PreDiabetes and patient denies any symptoms of reactive hypoglycemia, diabetic polys, paresthesias or visual blurring.  Last A1c was  5.9% on 6/29/201.    Further, Patient's last vitamin D was  85 on 05/06/2013.   Medication List   aspirin 325 MG tablet  Take 325 mg by mouth daily.     atorvastatin 80 MG tablet  Take 1 tablet (80 mg total) by mouth daily. (takes 1/2 = 40 mg 3 x week)     B COMPLEX PO  Take by mouth. Takes M,W,F     clotrimazole-betamethasone cream  Apply 1 application topically 2 (two) times daily. APPLY TO FOOT RASH only     ezetimibe 10 MG tablet  Take 1 tablet (10 mg total) by mouth daily.     fish oil-omega-3 fatty acids 1000 MG capsule  Take 2 g by mouth 3 (three) times daily.     Flax Seed Oil 1000 MG Caps  Take 2,000 mg by mouth daily.      hydrochlorothiazide 25 MG tablet  TAKE ONE TABLET BY MOUTH EVERY MORNING     levothyroxine 50 MCG tablet  TAKE ONE TABLET BY MOUTH ONCE DAILY     losartan 100 MG tablet  TAKE 1 TABLET DAILY FOR BLOOD PRESSURE     MAGNESIUM PO  Take 650 mg by mouth daily.     metoprolol tartrate 25 MG tablet  TAKE ONE TABLET BY MOUTH TWICE DAILY     multivitamin capsule  Take 1 capsule by mouth daily.     pyridOXINE  (VITAMIN B-6) 50 MG tablet  Take 50 mg by mouth daily.     sertraline 100 MG tablet  TAKE 1 TABLET DAILY     tadalafil (CIALIS)  5 MG tablet  Take 1 tablet (5 mg total) by mouth as needed for erectile dysfunction.     vitamin C 1000 MG tablet  Take 1,000 mg by mouth daily.     VITAMIN D PO  Take 6,000 Int'l Units by mouth daily.     Allergies  Allergen Reactions  . Ace Inhibitors     cough  . Bee Venom    PMHx:   Past Medical History  Diagnosis Date  . Cardiomegaly   . Mixed hyperlipidemia   . Hypertension   . Hypogonadism male   .  Sleep apnea   . Obesity   . Colon polyp   . Gallstones   . Pancreas (digestive gland) works poorly   . Prediabetes   . Vitamin D deficiency   . Fatty liver disease, nonalcoholic    FHx:    Reviewed / unchanged SHx:    Reviewed / unchanged  Systems Review: No Fever-chills,  No Headache, No changes with Vision or hearing,  No problems swallowing food or Liquids,  No Chest pain or productive Cough or Shortness of Breath,  No Abdominal pain, No Nausea or Vomitting, Bowel movements are regular,  No new skin rashes or bruises,  No new joints pains-aches,  No new weakness, tingling, numbness in any extremity,  No polyuria, polydypsia or polyphagia,  No significant Mental Stressors.  A full 10 point Review of Systems was done, except as stated above, all other Review of Systems were negative  Exam:  BP 120/74  Pulse 56  Temp 98.4 F   Resp 16  Ht 5' 7.5"   Wt 236 lb   BMI 36.43   Appears well nourished and in no  distress. Eyes: PERRLA, EOMs, conjunctiva no swelling or erythema. Sinuses: No frontal/maxillary tenderness ENT/Mouth: EAC's clear, TM's nl w/o erythema, bulging. Nares clear w/o erythema, swelling, exudates. Oropharynx clear without erythema or exudates. Oral hygiene is good. Tongue normal, non obstructing. Hearing intact.  Neck: Supple. Thyroid nl. Car 2+/2+ without bruits, nodes or JVD. Chest: Respirations nl with BS clear & equal w/o rales, rhonchi, wheezing or stridor.  Cor: Heart sounds normal w/ regular rate and rhythm without sig. murmurs, gallops, clicks, or rubs. Peripheral pulses normal and equal  without edema.  Abdomen: Soft & bowel sounds normal. Non-tender w/o guarding, rebound, hernias, masses, or organomegaly.  Lymphatics: Unremarkable.  Musculoskeletal: Full ROM all peripheral extremities, joint stability, 5/5 strength, and normal gait.  Skin: Warm, dry without exposed rashes, lesions or ecchymosis apparent.  Neuro: Cranial nerves intact, reflexes equal bilaterally. Sensory-motor testing grossly intact. Tendon reflexes grossly intact.  Pysch: Alert & oriented x 3.  Insight and judgement nl & appropriate. No ideations.  Assessment and Plan:  1. Hypertension - Continue monitor blood pressure at home. Continue diet/meds same.  2. Hyperlipidemia - Continue diet/meds, exercise,& lifestyle modifications. Continue monitor periodic cholesterol/liver & renal functions   3. Pre-Diabetes - Continue diet, exercise, lifestyle modifications. Monitor appropriate labs.  4. Elevated CPK -   Recommended regular exercise, BP monitoring, weight control, and discussed med and SE's. Recommended labs to assess and monitor clinical status. Further disposition pending results of labs.

## 2014-02-06 NOTE — Patient Instructions (Signed)

## 2014-02-07 ENCOUNTER — Ambulatory Visit (INDEPENDENT_AMBULATORY_CARE_PROVIDER_SITE_OTHER): Payer: BC Managed Care – PPO | Admitting: Internal Medicine

## 2014-02-07 ENCOUNTER — Encounter: Payer: Self-pay | Admitting: Internal Medicine

## 2014-02-07 VITALS — BP 120/74 | HR 56 | Temp 98.4°F | Resp 16 | Ht 67.5 in | Wt 236.2 lb

## 2014-02-07 DIAGNOSIS — Z79899 Other long term (current) drug therapy: Secondary | ICD-10-CM

## 2014-02-07 DIAGNOSIS — M79609 Pain in unspecified limb: Secondary | ICD-10-CM

## 2014-02-07 DIAGNOSIS — E559 Vitamin D deficiency, unspecified: Secondary | ICD-10-CM

## 2014-02-07 DIAGNOSIS — I10 Essential (primary) hypertension: Secondary | ICD-10-CM

## 2014-02-07 DIAGNOSIS — E782 Mixed hyperlipidemia: Secondary | ICD-10-CM

## 2014-02-07 LAB — HEPATIC FUNCTION PANEL
ALT: 44 U/L (ref 0–53)
AST: 37 U/L (ref 0–37)
Albumin: 4.6 g/dL (ref 3.5–5.2)
Alkaline Phosphatase: 49 U/L (ref 39–117)
BILIRUBIN DIRECT: 0.1 mg/dL (ref 0.0–0.3)
Indirect Bilirubin: 0.5 mg/dL (ref 0.2–1.2)
Total Bilirubin: 0.6 mg/dL (ref 0.2–1.2)
Total Protein: 6.9 g/dL (ref 6.0–8.3)

## 2014-02-07 LAB — LIPID PANEL
Cholesterol: 129 mg/dL (ref 0–200)
HDL: 39 mg/dL — ABNORMAL LOW (ref >39)
LDL Cholesterol: 35 mg/dL (ref 0–99)
Total CHOL/HDL Ratio: 3.3 Ratio
Triglycerides: 275 mg/dL — ABNORMAL HIGH (ref <150)
VLDL: 55 mg/dL — AB (ref 0–40)

## 2014-02-07 LAB — CK: Total CK: 483 U/L — ABNORMAL HIGH (ref 7–232)

## 2014-02-14 ENCOUNTER — Other Ambulatory Visit: Payer: Self-pay | Admitting: *Deleted

## 2014-02-14 MED ORDER — EZETIMIBE 10 MG PO TABS: 10.0000 mg | ORAL_TABLET | Freq: Every day | ORAL | Status: DC

## 2014-02-16 ENCOUNTER — Other Ambulatory Visit: Payer: Self-pay | Admitting: *Deleted

## 2014-02-16 MED ORDER — EZETIMIBE 10 MG PO TABS: 10.0000 mg | ORAL_TABLET | Freq: Every day | ORAL | Status: DC

## 2014-03-06 ENCOUNTER — Encounter (HOSPITAL_BASED_OUTPATIENT_CLINIC_OR_DEPARTMENT_OTHER): Payer: Self-pay | Admitting: Emergency Medicine

## 2014-03-06 ENCOUNTER — Emergency Department (HOSPITAL_BASED_OUTPATIENT_CLINIC_OR_DEPARTMENT_OTHER)
Admission: EM | Admit: 2014-03-06 | Discharge: 2014-03-06 | Disposition: A | Discharge status: Home / Self Care | Payer: BC Managed Care – PPO | Attending: Emergency Medicine | Admitting: Emergency Medicine

## 2014-03-06 ENCOUNTER — Other Ambulatory Visit: Payer: Self-pay

## 2014-03-06 DIAGNOSIS — T63443A Toxic effect of venom of bees, assault, initial encounter: Secondary | ICD-10-CM | POA: Insufficient documentation

## 2014-03-06 DIAGNOSIS — Z8719 Personal history of other diseases of the digestive system: Secondary | ICD-10-CM | POA: Insufficient documentation

## 2014-03-06 DIAGNOSIS — Y9289 Other specified places as the place of occurrence of the external cause: Secondary | ICD-10-CM | POA: Diagnosis not present

## 2014-03-06 DIAGNOSIS — I10 Essential (primary) hypertension: Secondary | ICD-10-CM | POA: Diagnosis not present

## 2014-03-06 DIAGNOSIS — Z86010 Personal history of colonic polyps: Secondary | ICD-10-CM | POA: Diagnosis not present

## 2014-03-06 DIAGNOSIS — E669 Obesity, unspecified: Secondary | ICD-10-CM | POA: Insufficient documentation

## 2014-03-06 DIAGNOSIS — E782 Mixed hyperlipidemia: Secondary | ICD-10-CM | POA: Diagnosis not present

## 2014-03-06 DIAGNOSIS — Z91030 Bee allergy status: Secondary | ICD-10-CM

## 2014-03-06 DIAGNOSIS — E559 Vitamin D deficiency, unspecified: Secondary | ICD-10-CM | POA: Diagnosis not present

## 2014-03-06 DIAGNOSIS — Z87891 Personal history of nicotine dependence: Secondary | ICD-10-CM | POA: Insufficient documentation

## 2014-03-06 DIAGNOSIS — Y9389 Activity, other specified: Secondary | ICD-10-CM | POA: Insufficient documentation

## 2014-03-06 MED ORDER — EPINEPHRINE HCL 1 MG/ML IJ SOLN
INTRAMUSCULAR | Status: AC
  Filled 2014-03-06: qty 1

## 2014-03-06 MED ORDER — EPINEPHRINE 0.3 MG/0.3ML IJ SOAJ: 0.3000 mg | Freq: Once | INTRAMUSCULAR | Status: AC

## 2014-03-06 MED ORDER — FAMOTIDINE IN NACL 20-0.9 MG/50ML-% IV SOLN
20.0000 mg | Freq: Once | INTRAVENOUS | Status: AC
  Administered 2014-03-06: 20 mg via INTRAVENOUS
  Filled 2014-03-06: qty 50

## 2014-03-06 MED ORDER — METHYLPREDNISOLONE SODIUM SUCC 125 MG IJ SOLR
125.0000 mg | Freq: Once | INTRAMUSCULAR | Status: AC
  Administered 2014-03-06: 125 mg via INTRAVENOUS
  Filled 2014-03-06: qty 2

## 2014-03-06 MED ORDER — DIPHENHYDRAMINE HCL 50 MG/ML IJ SOLN
12.5000 mg | Freq: Once | INTRAMUSCULAR | Status: AC
  Administered 2014-03-06: 12.5 mg via INTRAVENOUS
  Filled 2014-03-06: qty 1

## 2014-03-06 MED ORDER — DEXAMETHASONE SODIUM PHOSPHATE 10 MG/ML IJ SOLN
10.0000 mg | Freq: Once | INTRAMUSCULAR | Status: AC
  Administered 2014-03-06: 10 mg via INTRAMUSCULAR
  Filled 2014-03-06: qty 1

## 2014-03-06 MED ORDER — EPINEPHRINE HCL 1 MG/ML IJ SOLN
0.3000 mg | Freq: Once | INTRAMUSCULAR | Status: AC
  Administered 2014-03-06: 0.3 mg via INTRAMUSCULAR

## 2014-03-06 NOTE — ED Notes (Signed)
Pt discharged to home with family. NAD.  

## 2014-03-06 NOTE — Discharge Instructions (Signed)
Anaphylactic Reaction °An anaphylactic reaction is a sudden, severe allergic reaction. It affects the whole body. It can be life threatening. You may need to stay in the hospital.  °HOME CARE °· Wear a medical bracelet or necklace that lists your allergy. °· Carry your allergy kit or medicine shot to treat severe allergic reactions with you. These can save your life. °· Do not drive until medicine from your shot has worn off, unless your doctor says it is okay. °· If you have hives or a rash: °¨ Take medicine as told by your doctor. °¨ You may take over-the-counter antihistamine medicine. °¨ Place cold cloths on your skin. Take baths in cool water. Avoid hot baths and hot showers. °GET HELP RIGHT AWAY IF:  °· Your mouth is puffy (swollen), or you have trouble breathing. °· You start making whistling sounds when you breathe (wheezing). °· You have a tight feeling in your chest or throat. °· You have a rash, hives, puffiness, or itching on your body. °· You throw up (vomit) or have watery poop (diarrhea). °· You feel dizzy or pass out (faint). °· You think you are having an allergic reaction. °· You have new symptoms. °This is an emergency. Use your medicine shot or allergy kit as told. Call your local emergency services (911 in [place] Even if you feel better after the shot, you need to go to the hospital emergency department. °MAKE SURE YOU:  °· Understand these instructions. °· Will watch your condition. °· Will get help right away if you are not doing well or get worse. °Document Released: 10/30/2007 Document Revised: 11/12/2011 Document Reviewed: 08/14/2011 °[employer]® Patient Information ©2015 [employer]. This information is not intended to replace advice given to you by your health care provider. Make sure you discuss any questions you have with your health care provider. ° °

## 2014-03-06 NOTE — ED Provider Notes (Signed)
CSN: [id number]     Arrival date & time 03/06/14  1549 History  This chart was scribed for [name] Lanes, MD by Rayfield Citizen, ED Scribe. This patient was seen in room MH11/MH11 and the patient's care was started at 3:54 PM.    Chief Complaint  Patient presents with  . Allergic Reaction   The history is provided by the patient. No language interpreter was used.    HPI Comments: [name] [name] is a 63 y.o. male who presents to the Emergency Department complaining of an allergic reaction after approx. three bee stings 1 hour PTA. He notes chest pain, tightness, and SOB. He reports slight swelling at the area of the stings but then noticed his tongue/throat swelling soon after. Patient notes a history of similar symptoms - he has a known bee allergy but does not have an Epi Pen at home. He took two Benadryl tablets before coming in today.   Past Medical History  Diagnosis Date  . Cardiomegaly   . Mixed hyperlipidemia   . Hypertension   . Hypogonadism male   . Sleep apnea   . Obesity   . Colon polyp   . Gallstones   . Pancreas (digestive gland) works poorly   . Prediabetes   . Vitamin D deficiency   . Fatty liver disease, nonalcoholic    Past Surgical History  Procedure Laterality Date  . Cholecystectomy  1976  . Appendectomy  1964  . Ankle fracture surgery Left 1986  . Carpal tunnel release Bilateral 1996   Family History  Problem Relation Age of Onset  . Diabetes Mother   . Diabetes Father     brother and sister  . Heart disease Father   . Hypertension Father   . Diabetes Sister    History  Substance Use Topics  . Smoking status: Former Smoker -- 2.00 packs/day for 7 years    Types: Cigarettes    Quit date: 05/27/1974  . Smokeless tobacco: Never Used  . Alcohol Use: No    Review of Systems  A complete 10 system review of systems was obtained and all systems are negative except as noted in the HPI and PMH.   Allergies  Ace inhibitors and Bee venom  Home  Medications   Prior to Admission medications   Medication Sig Start Date End Date Taking? Authorizing Provider  Ascorbic Acid (VITAMIN C) 1000 MG tablet Take 1,000 mg by mouth daily.     Historical Provider, MD  aspirin 325 MG tablet Take 325 mg by mouth daily.      Historical Provider, MD  atorvastatin (LIPITOR) 80 MG tablet Take 1 tablet (80 mg total) by mouth daily. 01/03/14   Unk Pinto, MD  B Complex Vitamins (B COMPLEX PO) Take by mouth. Takes M,W,F    Historical Provider, MD  Cholecalciferol (VITAMIN D PO) Take 6,000 Int'l Units by mouth daily.    Historical Provider, MD  EPINEPHrine (EPIPEN 2-PAK) 0.3 mg/0.3 mL IJ SOAJ injection Inject 0.3 mLs (0.3 mg total) into the muscle once. 03/06/14   [name] Lanes, MD  ezetimibe (ZETIA) 10 MG tablet Take 1 tablet (10 mg total) by mouth daily. 02/16/14 02/16/15  Unk Pinto, MD  fish oil-omega-3 fatty acids 1000 MG capsule Take 2 g by mouth 3 (three) times daily.     Historical Provider, MD  Flaxseed, Linseed, (FLAX SEED OIL) 1000 MG CAPS Take 2,000 mg by mouth daily.    Historical Provider, MD  hydrochlorothiazide (HYDRODIURIL) 25 MG  tablet TAKE ONE TABLET BY MOUTH EVERY MORNING  12/26/13   Unk Pinto, MD  levothyroxine (SYNTHROID, LEVOTHROID) 50 MCG tablet TAKE ONE TABLET BY MOUTH ONCE DAILY    Vicie Mutters, PA-C  losartan (COZAAR) 100 MG tablet TAKE 1 TABLET DAILY FOR BLOOD PRESSURE 01/09/14 01/10/15  Unk Pinto, MD  MAGNESIUM PO Take 650 mg by mouth daily.     Historical Provider, MD  metoprolol tartrate (LOPRESSOR) 25 MG tablet TAKE ONE TABLET BY MOUTH TWICE DAILY  12/26/13   Unk Pinto, MD  Multiple Vitamin (MULTIVITAMIN) capsule Take 1 capsule by mouth daily.      Historical Provider, MD  sertraline (ZOLOFT) 100 MG tablet TAKE 1 TABLET DAILY    Melissa R Smith, PA-C  tadalafil (CIALIS) 5 MG tablet Take 1 tablet (5 mg total) by mouth as needed for erectile dysfunction. 04/07/13 04/07/14  Melissa R Smith, PA-C   BP 140/82   Pulse 88  Temp(Src) 99.4 F (37.4 C)  Resp 17  Ht 5\' 8"  (1.727 m)  Wt 236 lb (107.049 kg)  BMI 35.89 kg/m2  SpO2 93% Physical Exam Physical Exam  Nursing note and vitals reviewed. Constitutional: He is oriented to person, place, and time. He appears well-developed and well-nourished. No distress.  HENT:  Mouth: Minimal amount of swelling noted the time.  No stridor or difficulty swallowing. Head: Normocephalic and atraumatic.  Eyes: Pupils are equal, round, and reactive to light.  Neck: Normal range of motion.  Cardiovascular: Normal rate and intact distal pulses.   Pulmonary/Chest: No respiratory distress.  Abdominal: Normal appearance. He exhibits no distension.  Musculoskeletal: Normal range of motion.  Neurological: He is alert and oriented to person, place, and time. No cranial nerve deficit.  Skin: Skin is warm and dry. No rash noted.    ED Course  Procedures  Medications  dexamethasone (DECADRON) injection 10 mg (not administered)  EPINEPHrine (ADRENALIN) injection 0.3 mg (0.3 mg Intramuscular Given 03/06/14 1601)  methylPREDNISolone sodium succinate (SOLU-MEDROL) 125 mg/2 mL injection 125 mg (125 mg Intravenous Given 03/06/14 1615)  famotidine (PEPCID) IVPB 20 mg (0 mg Intravenous Stopped 03/06/14 1730)  diphenhydrAMINE (BENADRYL) injection 12.5 mg (12.5 mg Intravenous Given 03/06/14 1615)    CRITICAL CARE Performed by: Leonard Schwartz L Total critical care time: 30 minutes Critical care time was exclusive of separately billable procedures and treating other patients. Critical care was necessary to treat or prevent imminent or life-threatening deterioration. Critical care was time spent personally by me on the following activities: development of treatment plan with patient and/or surrogate as well as nursing, discussions with consultants, evaluation of patient's response to treatment, examination of patient, obtaining history from patient or surrogate, ordering and  performing treatments and interventions, ordering and review of laboratory studies, ordering and review of radiographic studies, pulse oximetry and re-evaluation of patient's condition.  DIAGNOSTIC STUDIES: Oxygen Saturation is 97% , normal by my interpretation.    COORDINATION OF CARE: 9:04 PM Discussed treatment plan with pt at bedside and pt agreed to plan.   Labs Review Labs Reviewed - No data to display  Imaging Review No results found.   EKG Interpretation   Date/Time:  Sunday March 06 2014 15:58:08 EDT Ventricular Rate:  82 PR Interval:  162 QRS Duration: 94 QT Interval:  366 QTC Calculation: 427 R Axis:   -35 Text Interpretation:  Normal sinus rhythm Left axis deviation Abnormal ECG  Confirmed by Kaedance Magos  MD, Bulah Lurie ([id number]) on 03/06/2014 5:28:23 PM     After treatment in  the ED the patient feels back to baseline and wants to go home. MDM   Final diagnoses:  Bee sting allergy    I personally performed the services described in this documentation, which was scribed in my presence. The recorded information has been reviewed and considered.    [name] Lanes, MD 03/06/14 2105

## 2014-03-06 NOTE — ED Notes (Signed)
MD at bedside. 

## 2014-03-06 NOTE — ED Notes (Signed)
Pt reports being stung by a few bee's about an hour ago - reports taking Benadryl PTA. Reports history of being allergic to bee's but no Epi Pen at home. Pt c/o chest pain, tightness, tongue swollen and feeling short of breath. Pulse Ox 97% on Room Air, HR 88 at this time. Pt to room 11.

## 2014-04-18 ENCOUNTER — Encounter: Payer: Self-pay | Admitting: Internal Medicine

## 2014-04-18 ENCOUNTER — Other Ambulatory Visit: Payer: Self-pay | Admitting: *Deleted

## 2014-04-18 ENCOUNTER — Ambulatory Visit (INDEPENDENT_AMBULATORY_CARE_PROVIDER_SITE_OTHER): Payer: BC Managed Care – PPO | Admitting: Internal Medicine

## 2014-04-18 ENCOUNTER — Other Ambulatory Visit: Payer: Self-pay

## 2014-04-18 VITALS — BP 130/76 | HR 79 | Ht 67.0 in | Wt 239.8 lb

## 2014-04-18 DIAGNOSIS — E669 Obesity, unspecified: Secondary | ICD-10-CM

## 2014-04-18 DIAGNOSIS — G4733 Obstructive sleep apnea (adult) (pediatric): Secondary | ICD-10-CM

## 2014-04-18 MED ORDER — ATORVASTATIN CALCIUM 80 MG PO TABS: 80.0000 mg | ORAL_TABLET | Freq: Every day | ORAL | Status: DC

## 2014-04-18 MED ORDER — HYDROCHLOROTHIAZIDE 25 MG PO TABS: 25.0000 mg | ORAL_TABLET | Freq: Every morning | ORAL | Status: DC

## 2014-04-18 MED ORDER — METOPROLOL TARTRATE 25 MG PO TABS: 25.0000 mg | ORAL_TABLET | Freq: Two times a day (BID) | ORAL | Status: DC

## 2014-04-18 NOTE — Progress Notes (Signed)
04/05/11- 63 year old male former smoker seen at kind request of Dr. Melford Aase because of sleep apnea. I had seen him in the past about 7 years ago NPSG 08/25/95- Severe obstructive sleep apnea, AHI 58/hr. He is happy with CPAP but his machine is getting very old. Pressure is set at 12.5. He describes good compliance and control and wears his CPAP every night and for naps. He needs replacement of mask. Bedtime 8 to 9 PM estimating sleep latency 10 minutes and waking 3 times during the night before finally up at 4 AM.  No history of ENT surgery, heart or lung disease. Treated for high blood pressure. Weight today is 233 pounds, up 20 pounds from 1997. He is married, working as a Geneticist, molecular. He smoked 2 packs per day for 7 years, ending in 1976.  04/06/12- 27year-old male former smoker followed for sleep apnea complicated by obesity, HBP  Patient states that he wears CPAP 12.5/ Advanced nightly x 6-8hrs. Denies problems with mask or pressure.  He got a replacement CPAP machine last year and is doing very well with good compliance and control. He usually does not use his humidifier. He is working from 6 AM until 2:30 PM and then takes an occasional nap.  04/16/13-  46year-old male former smoker followed for sleep apnea complicated by obesity, HBP FOLLOWS FOR:  Wearing CPAP 7-8 hours per night. No concerns today He likes his new CPAP machine-CPAP 12.5/[facility], nasal mask. Sleeps well with no snoring.  04/18/14-  37year-old male former smoker followed for sleep apnea complicated by obesity, HBP FOLLOWS FOR: DME is [facility]; needs order for supplies and mask of choice. Wears CPAP 12.5/ [facility] every night for about 6-8 hours. Need to get download and enroll in [place]. Has been using CPAP for 19 years  ROS- see HPI Constitutional:   No-   weight loss, night sweats, fevers, chills, fatigue, lassitude. HEENT:   No-  headaches, difficulty swallowing, tooth/dental problems, sore throat,       No-  sneezing,  itching, ear ache, nasal congestion, post nasal drip,  CV:  No-   chest pain, orthopnea, PND, swelling in lower extremities, anasarca, dizziness, palpitations Resp: No-   shortness of breath with exertion or at rest.              No-   productive cough,  No non-productive cough,  No- coughing up of blood.              No-   change in color of mucus.  No- wheezing.   Skin: No-   rash or lesions. GI:  No-   heartburn, indigestion, abdominal pain, nausea, vomiting,  GU: . MS:  No-   joint pain or swelling.  Neuro-     nothing unusual Psych:  No- change in mood or affect. No depression or anxiety.  No memory loss.  OBJ  General- Alert, Oriented, Affect-appropriate, Distress- none acute; overweight. Full beard Skin- rash-none, lesions- none, excoriation- none Lymphadenopathy- none Head- atraumatic            Eyes- Gross vision intact, PERRLA, conjunctivae clear secretions            Ears- Hearing, canals-normal            Nose- Clear, no-Septal dev, mucus, polyps, erosion, perforation             Throat- Mallampati III-IV , mucosa +red, drainage- none, tonsils- atrophic Neck- flexible , trachea midline, no stridor , thyroid nl, carotid no  bruit Chest - symmetrical excursion , unlabored           Heart/CV- RRR , no murmur , no gallop  , no rub, nl s1 s2                           - JVD- none , edema- none, stasis changes- none, varices- none           Lung- clear to P&A, wheeze- none, cough- none , dullness-none, rub- none           Chest wall-  Abd-  Br/ Gen/ Rectal- Not done, not indicated Extrem- cyanosis- none, clubbing, none, atrophy- none, strength- nl Neuro- grossly intact to observation

## 2014-04-18 NOTE — Patient Instructions (Signed)
We can continue CPAP 12.5/ [facility]   Dx OSA  Order- DME [facility]- Download CPAP for prescure compliance, airview              May need new card for machine

## 2014-04-22 NOTE — Assessment & Plan Note (Signed)
Good compliance and control by description. Okay to continue pressure 12.5/Advanced Plan-discussed replacement mask, download

## 2014-04-22 NOTE — Assessment & Plan Note (Signed)
Explained how weight loss could help CPAP control

## 2014-05-22 ENCOUNTER — Other Ambulatory Visit: Payer: Self-pay | Admitting: Emergency Medicine

## 2014-06-05 ENCOUNTER — Other Ambulatory Visit: Payer: Self-pay | Admitting: Physician Assistant

## 2014-06-09 ENCOUNTER — Ambulatory Visit (INDEPENDENT_AMBULATORY_CARE_PROVIDER_SITE_OTHER): Payer: BLUE CROSS/BLUE SHIELD | Admitting: Internal Medicine

## 2014-06-09 ENCOUNTER — Encounter: Payer: Self-pay | Admitting: Internal Medicine

## 2014-06-09 VITALS — BP 116/82 | HR 56 | Temp 99.3°F | Resp 16 | Ht 67.5 in | Wt 237.8 lb

## 2014-06-09 DIAGNOSIS — E039 Hypothyroidism, unspecified: Secondary | ICD-10-CM

## 2014-06-09 DIAGNOSIS — E782 Mixed hyperlipidemia: Secondary | ICD-10-CM

## 2014-06-09 DIAGNOSIS — E291 Testicular hypofunction: Secondary | ICD-10-CM

## 2014-06-09 DIAGNOSIS — R7309 Other abnormal glucose: Secondary | ICD-10-CM

## 2014-06-09 DIAGNOSIS — E349 Endocrine disorder, unspecified: Secondary | ICD-10-CM

## 2014-06-09 DIAGNOSIS — I10 Essential (primary) hypertension: Secondary | ICD-10-CM

## 2014-06-09 DIAGNOSIS — E559 Vitamin D deficiency, unspecified: Secondary | ICD-10-CM

## 2014-06-09 DIAGNOSIS — R7303 Prediabetes: Secondary | ICD-10-CM

## 2014-06-09 DIAGNOSIS — Z79899 Other long term (current) drug therapy: Secondary | ICD-10-CM

## 2014-06-09 LAB — CBC WITH DIFFERENTIAL/PLATELET
BASOS PCT: 1 % (ref 0–1)
Basophils Absolute: 0 10*3/uL (ref 0.0–0.1)
Eosinophils Absolute: 0.3 10*3/uL (ref 0.0–0.7)
Eosinophils Relative: 6 % — ABNORMAL HIGH (ref 0–5)
HCT: 42.7 % (ref 39.0–52.0)
Hemoglobin: 14.7 g/dL (ref 13.0–17.0)
Lymphocytes Relative: 24 % (ref 12–46)
Lymphs Abs: 1.2 10*3/uL (ref 0.7–4.0)
MCH: 31 pg (ref 26.0–34.0)
MCHC: 34.4 g/dL (ref 30.0–36.0)
MCV: 90.1 fL (ref 78.0–100.0)
MPV: 9.7 fL (ref 8.6–12.4)
Monocytes Absolute: 0.7 10*3/uL (ref 0.1–1.0)
Monocytes Relative: 14 % — ABNORMAL HIGH (ref 3–12)
Neutro Abs: 2.7 10*3/uL (ref 1.7–7.7)
Neutrophils Relative %: 55 % (ref 43–77)
Platelets: 236 10*3/uL (ref 150–400)
RBC: 4.74 MIL/uL (ref 4.22–5.81)
RDW: 14.3 % (ref 11.5–15.5)
WBC: 4.9 10*3/uL (ref 4.0–10.5)

## 2014-06-09 LAB — HEMOGLOBIN A1C
HEMOGLOBIN A1C: 6 % — AB (ref <5.7)
Mean Plasma Glucose: 126 mg/dL — ABNORMAL HIGH (ref <117)

## 2014-06-09 MED ORDER — SERTRALINE HCL 100 MG PO TABS: ORAL_TABLET | ORAL | Status: DC

## 2014-06-09 NOTE — Patient Instructions (Signed)

## 2014-06-09 NOTE — Progress Notes (Signed)
Patient ID: [name] [name], male   DOB: Apr 16, 1951, 64 y.o.   MRN: [id number]   This very nice 64 y.o.MWM presents for 3 month follow up with Hypertension, Hyperlipidemia, Pre-Diabetes and Vitamin D Deficiency.    Patient is treated for HTN & BP has been controlled at home. Today's BP: 116/82 mmHg. Patient has had no complaints of any cardiac type chest pain, palpitations, dyspnea/orthopnea/PND, dizziness, claudication, or dependent edema.   Hyperlipidemia is controlled with diet & meds. Patient denies myalgias or other med SE's. Last Lipids were at goal -  Total Chol 129; HDL 39; LDL  35; w/ elevated Trig 275 on 02/07/2014.   Also, the patient has history of PreDiabetes and has had no symptoms of reactive hypoglycemia, diabetic polys, paresthesias or visual blurring.  Last A1c was 5.9% on 11/22/2013.   Further, the patient also has history of Vitamin D Deficiency and supplements vitamin D without any suspected side-effects. Last vitamin D was 34 in Dec 2014.    Medication List   atorvastatin 80 MG tablet  Commonly known as:  LIPITOR  Take 1 tablet (80 mg total) by mouth daily.     B COMPLEX PO  Take by mouth. Takes M,W,F     EPINEPHrine 0.3 mg/0.3 mL Soaj injection  Commonly known as:  EPIPEN 2-PAK  Inject 0.3 mLs (0.3 mg total) into the muscle once.     ezetimibe 10 MG tablet  Commonly known as:  ZETIA  Take 1 tablet (10 mg total) by mouth daily.     fish oil-omega-3 fatty acids 1000 MG capsule  Take 2 g by mouth 3 (three) times daily.     Flax Seed Oil 1000 MG Caps  Take 2,000 mg by mouth daily.     hydrochlorothiazide 25 MG tablet  Commonly known as:  HYDRODIURIL  Take 1 tablet (25 mg total) by mouth every morning.     levothyroxine 50 MCG tablet  Commonly known as:  SYNTHROID, LEVOTHROID  TAKE ONE TABLET BY MOUTH ONCE DAILY     losartan 100 MG tablet  Commonly known as:  COZAAR  TAKE 1 TABLET DAILY FOR BLOOD PRESSURE     MAGNESIUM PO  Take 650 mg by mouth daily.      metoprolol tartrate 25 MG tablet  Commonly known as:  LOPRESSOR  Take 1 tablet (25 mg total) by mouth 2 (two) times daily.     multivitamin capsule  Take 1 capsule by mouth daily.     sertraline 100 MG tablet  Commonly known as:  ZOLOFT  TAKE 1 TABLET DAILY     tadalafil 5 MG tablet  Commonly known as:  CIALIS  Take 1 tablet (5 mg total) by mouth as needed for erectile dysfunction.     vitamin C 1000 MG tablet  Take 1,000 mg by mouth daily.     VITAMIN D PO  Take 6,000 Int'l Units by mouth daily.     Allergies  Allergen Reactions  . Ace Inhibitors     cough  . Bee Venom     PMHx:   Past Medical History  Diagnosis Date  . Cardiomegaly   . Mixed hyperlipidemia   . Hypertension   . Hypogonadism male   . Sleep apnea   . Obesity   . Colon polyp   . Gallstones   . Pancreas (digestive gland) works poorly   . Prediabetes   . Vitamin D deficiency   . Fatty liver disease, nonalcoholic    Immunization  History  Administered Date(s) Administered  . Influenza Split 03/06/2012, 02/24/2013, 03/18/2014  . PPD Test 07/26/2013  . Pneumococcal Polysaccharide-23 07/26/2013  . Pneumococcal-Unspecified 05/04/2004  . Tdap 07/24/2011  . Zoster 12/02/2013   Past Surgical History  Procedure Laterality Date  . Cholecystectomy  1976  . Appendectomy  1964  . Ankle fracture surgery Left 1986  . Carpal tunnel release Bilateral 1996   FHx:    Reviewed / unchanged  SHx:    Reviewed / unchanged  Systems Review:  Constitutional: Denies fever, chills, wt changes, headaches, insomnia, fatigue, night sweats, change in appetite. Eyes: Denies redness, blurred vision, diplopia, discharge, itchy, watery eyes.  ENT: Denies discharge, congestion, post nasal drip, epistaxis, sore throat, earache, hearing loss, dental pain, tinnitus, vertigo, sinus pain, snoring.  CV: Denies chest pain, palpitations, irregular heartbeat, syncope, dyspnea, diaphoresis, orthopnea, PND, claudication or  edema. Respiratory: denies cough, dyspnea, DOE, pleurisy, hoarseness, laryngitis, wheezing.  Gastrointestinal: Denies dysphagia, odynophagia, heartburn, reflux, water brash, abdominal pain or cramps, nausea, vomiting, bloating, diarrhea, constipation, hematemesis, melena, hematochezia  or hemorrhoids. Genitourinary: Denies dysuria, frequency, urgency, nocturia, hesitancy, discharge, hematuria or flank pain. Musculoskeletal: Denies arthralgias, myalgias, stiffness, jt. swelling, pain, limping or strain/sprain.  Skin: Denies pruritus, rash, hives, warts, acne, eczema or change in skin lesion(s). Neuro: No weakness, tremor, incoordination, spasms, paresthesia or pain. Psychiatric: Denies confusion, memory loss or sensory loss. Endo: Denies change in weight, skin or hair change.  Heme/Lymph: No excessive bleeding, bruising or enlarged lymph nodes.  Physical Exam  BP 116/82   Pulse 56  Temp  99.3 F   Resp 16  Ht 5' 7.5"   Wt 237 lb 12.8 oz     BMI 36.67   Appears well nourished and in no distress. Eyes: PERRLA, EOMs, conjunctiva no swelling or erythema. Sinuses: No frontal/maxillary tenderness ENT/Mouth: EAC's clear, TM's nl w/o erythema, bulging. Nares clear w/o erythema, swelling, exudates. Oropharynx clear without erythema or exudates. Oral hygiene is good. Tongue normal, non obstructing. Hearing intact.  Neck: Supple. Thyroid nl. Car 2+/2+ without bruits, nodes or JVD. Chest: Respirations nl with BS clear & equal w/o rales, rhonchi, wheezing or stridor.  Cor: Heart sounds normal w/ regular rate and rhythm without sig. murmurs, gallops, clicks, or rubs. Peripheral pulses normal and equal  without edema.  Abdomen: Soft & bowel sounds normal. Non-tender w/o guarding, rebound, hernias, masses, or organomegaly.  Lymphatics: Unremarkable.  Musculoskeletal: Full ROM all peripheral extremities, joint stability, 5/5 strength, and normal gait.  Skin: Warm, dry without exposed rashes, lesions or  ecchymosis apparent.  Neuro: Cranial nerves intact, reflexes equal bilaterally. Sensory-motor testing grossly intact. Tendon reflexes grossly intact.  Pysch: Alert & oriented x 3.  Insight and judgement nl & appropriate. No ideations.  Assessment and Plan:  1. Hypertension - Continue monitor blood pressure at home. Continue diet/meds same.  2. Hyperlipidemia - Continue diet/meds, exercise,& lifestyle modifications. Continue monitor periodic cholesterol/liver & renal functions   3. Pre-Diabetes - Continue diet, exercise, lifestyle modifications. Monitor appropriate labs.  4. Vitamin D Deficiency - Continue supplementation.   Recommended regular exercise, BP monitoring, weight control, and discussed med and SE's. Recommended labs to assess and monitor clinical status. Further disposition pending results of labs.

## 2014-06-10 LAB — HEPATIC FUNCTION PANEL
ALBUMIN: 4.4 g/dL (ref 3.5–5.2)
ALT: 50 U/L (ref 0–53)
AST: 37 U/L (ref 0–37)
Alkaline Phosphatase: 52 U/L (ref 39–117)
BILIRUBIN DIRECT: 0.1 mg/dL (ref 0.0–0.3)
BILIRUBIN INDIRECT: 0.4 mg/dL (ref 0.2–1.2)
Total Bilirubin: 0.5 mg/dL (ref 0.2–1.2)
Total Protein: 7.1 g/dL (ref 6.0–8.3)

## 2014-06-10 LAB — BASIC METABOLIC PANEL WITH GFR
BUN: 15 mg/dL (ref 6–23)
CO2: 28 meq/L (ref 19–32)
Calcium: 9.7 mg/dL (ref 8.4–10.5)
Chloride: 100 mEq/L (ref 96–112)
Creat: 0.85 mg/dL (ref 0.50–1.35)
GFR, Est Non African American: >89 mL/min
Glucose, Bld: 97 mg/dL (ref 70–99)
POTASSIUM: 4.3 meq/L (ref 3.5–5.3)
SODIUM: 140 meq/L (ref 135–145)

## 2014-06-10 LAB — TSH: TSH: 0.758 u[IU]/mL (ref 0.350–4.500)

## 2014-06-10 LAB — LIPID PANEL
CHOL/HDL RATIO: 3.4 ratio
Cholesterol: 127 mg/dL (ref 0–200)
HDL: 37 mg/dL — ABNORMAL LOW (ref >39)
LDL Cholesterol: 47 mg/dL (ref 0–99)
Triglycerides: 217 mg/dL — ABNORMAL HIGH (ref <150)
VLDL: 43 mg/dL — ABNORMAL HIGH (ref 0–40)

## 2014-06-10 LAB — VITAMIN D 25 HYDROXY (VIT D DEFICIENCY, FRACTURES): Vit D, 25-Hydroxy: 62 ng/mL (ref 30–100)

## 2014-06-10 LAB — INSULIN, FASTING: Insulin fasting, serum: 24.1 u[IU]/mL — ABNORMAL HIGH (ref 2.0–19.6)

## 2014-06-10 LAB — MAGNESIUM: Magnesium: 1.9 mg/dL (ref 1.5–2.5)

## 2014-07-27 ENCOUNTER — Encounter: Payer: Self-pay | Admitting: Emergency Medicine

## 2014-08-08 ENCOUNTER — Other Ambulatory Visit: Payer: Self-pay | Admitting: *Deleted

## 2014-08-08 MED ORDER — SERTRALINE HCL 100 MG PO TABS: ORAL_TABLET | ORAL | Status: DC

## 2014-09-12 ENCOUNTER — Encounter: Payer: Self-pay | Admitting: Physician Assistant

## 2014-09-12 ENCOUNTER — Ambulatory Visit (INDEPENDENT_AMBULATORY_CARE_PROVIDER_SITE_OTHER): Payer: BLUE CROSS/BLUE SHIELD | Admitting: Physician Assistant

## 2014-09-12 VITALS — BP 122/80 | HR 58 | Temp 98.0°F | Resp 18 | Ht 67.5 in | Wt 230.0 lb

## 2014-09-12 DIAGNOSIS — E669 Obesity, unspecified: Secondary | ICD-10-CM

## 2014-09-12 DIAGNOSIS — F325 Major depressive disorder, single episode, in full remission: Secondary | ICD-10-CM | POA: Insufficient documentation

## 2014-09-12 DIAGNOSIS — I10 Essential (primary) hypertension: Secondary | ICD-10-CM

## 2014-09-12 DIAGNOSIS — D225 Melanocytic nevi of trunk: Secondary | ICD-10-CM

## 2014-09-12 DIAGNOSIS — E039 Hypothyroidism, unspecified: Secondary | ICD-10-CM

## 2014-09-12 DIAGNOSIS — E559 Vitamin D deficiency, unspecified: Secondary | ICD-10-CM

## 2014-09-12 DIAGNOSIS — Z1212 Encounter for screening for malignant neoplasm of rectum: Secondary | ICD-10-CM

## 2014-09-12 DIAGNOSIS — F3341 Major depressive disorder, recurrent, in partial remission: Secondary | ICD-10-CM | POA: Insufficient documentation

## 2014-09-12 DIAGNOSIS — M791 Myalgia: Secondary | ICD-10-CM

## 2014-09-12 DIAGNOSIS — L570 Actinic keratosis: Secondary | ICD-10-CM

## 2014-09-12 DIAGNOSIS — Z79899 Other long term (current) drug therapy: Secondary | ICD-10-CM

## 2014-09-12 DIAGNOSIS — R6889 Other general symptoms and signs: Secondary | ICD-10-CM

## 2014-09-12 DIAGNOSIS — N529 Male erectile dysfunction, unspecified: Secondary | ICD-10-CM

## 2014-09-12 DIAGNOSIS — M21372 Foot drop, left foot: Secondary | ICD-10-CM

## 2014-09-12 DIAGNOSIS — E782 Mixed hyperlipidemia: Secondary | ICD-10-CM

## 2014-09-12 DIAGNOSIS — G4733 Obstructive sleep apnea (adult) (pediatric): Secondary | ICD-10-CM

## 2014-09-12 DIAGNOSIS — Z0001 Encounter for general adult medical examination with abnormal findings: Secondary | ICD-10-CM

## 2014-09-12 DIAGNOSIS — E349 Endocrine disorder, unspecified: Secondary | ICD-10-CM

## 2014-09-12 DIAGNOSIS — Z87898 Personal history of other specified conditions: Secondary | ICD-10-CM

## 2014-09-12 DIAGNOSIS — Z125 Encounter for screening for malignant neoplasm of prostate: Secondary | ICD-10-CM

## 2014-09-12 DIAGNOSIS — IMO0001 Reserved for inherently not codable concepts without codable children: Secondary | ICD-10-CM

## 2014-09-12 DIAGNOSIS — R7303 Prediabetes: Secondary | ICD-10-CM

## 2014-09-12 LAB — HEMOGLOBIN A1C
HEMOGLOBIN A1C: 6.3 % — AB (ref <5.7)
Mean Plasma Glucose: 134 mg/dL — ABNORMAL HIGH (ref <117)

## 2014-09-12 LAB — CBC WITH DIFFERENTIAL/PLATELET
BASOS ABS: 0 10*3/uL (ref 0.0–0.1)
Basophils Relative: 0 % (ref 0–1)
EOS PCT: 3 % (ref 0–5)
Eosinophils Absolute: 0.1 10*3/uL (ref 0.0–0.7)
HCT: 46.4 % (ref 39.0–52.0)
Hemoglobin: 15.6 g/dL (ref 13.0–17.0)
LYMPHS ABS: 1.3 10*3/uL (ref 0.7–4.0)
Lymphocytes Relative: 27 % (ref 12–46)
MCH: 30.1 pg (ref 26.0–34.0)
MCHC: 33.6 g/dL (ref 30.0–36.0)
MCV: 89.4 fL (ref 78.0–100.0)
MPV: 9.7 fL (ref 8.6–12.4)
Monocytes Absolute: 0.7 10*3/uL (ref 0.1–1.0)
Monocytes Relative: 14 % — ABNORMAL HIGH (ref 3–12)
Neutro Abs: 2.6 10*3/uL (ref 1.7–7.7)
Neutrophils Relative %: 56 % (ref 43–77)
PLATELETS: 221 10*3/uL (ref 150–400)
RBC: 5.19 MIL/uL (ref 4.22–5.81)
RDW: 14 % (ref 11.5–15.5)
WBC: 4.7 10*3/uL (ref 4.0–10.5)

## 2014-09-12 LAB — URINALYSIS, ROUTINE W REFLEX MICROSCOPIC
BILIRUBIN URINE: NEGATIVE
Glucose, UA: NEGATIVE mg/dL
HGB URINE DIPSTICK: NEGATIVE
KETONES UR: NEGATIVE mg/dL
Leukocytes, UA: NEGATIVE
NITRITE: NEGATIVE
PROTEIN: NEGATIVE mg/dL
SPECIFIC GRAVITY, URINE: 1.016 (ref 1.005–1.030)
UROBILINOGEN UA: 0.2 mg/dL (ref 0.0–1.0)
pH: 7 (ref 5.0–8.0)

## 2014-09-12 NOTE — Progress Notes (Signed)
Complete Physical  Assessment and Plan: 1. Essential hypertension - continue medications, DASH diet, exercise and monitor at home. Call if greater than 130/80.  - CBC with Differential/Platelet - BASIC METABOLIC PANEL WITH GFR - Hepatic function panel - Urinalysis, Routine w reflex microscopic - Microalbumin / creatinine urine ratio - EKG 12-Lead - [place], RETROPERITNL ABD,  LTD  2. Hypothyroidism, unspecified hypothyroidism type Hypothyroidism-check TSH level, continue medications the same, reminded to take on an empty stomach 30-78mins before food.  - TSH  3. Prediabetes Discussed general issues about diabetes pathophysiology and management., Educational material distributed., Suggested low cholesterol diet., Encouraged aerobic exercise., Discussed foot care., Reminded to get yearly retinal exam. - Hemoglobin A1c - Insulin, fasting - LOW EXTREMITY NEUR EXAM DOCUM  4. Hyperlipidemia -continue medications, check lipids, decrease fatty foods, increase activity.  - Lipid panel  5. Obesity Obesity with co morbidities- long discussion about weight loss, diet, and exercise  6. Vitamin D deficiency - Vit D  25 hydroxy (rtn osteoporosis monitoring)  7. Testosterone deficiency - Testosterone  8. Medication management - Magnesium  9. Left foot drop Monitor, follow up ortho  10. myositis - CK  11. Obstructive sleep apnea Continue CPAP  12. Erectile dysfunction, unspecified erectile dysfunction type Continue testosterone and cialis PRN  13. Actinic keratosis Schedule freezing with mole removal.   14. Atypical nevus of abdominal wall schedule removal  15. Major depression in remission Continue zoloft  16. History of elevated PSA - PSA  17. Screening for rectal cancer - POC Hemoccult Bld/Stl (3-Cd Home Screen); Future  Discussed med's effects and SE's. Screening labs and tests as requested with regular follow-up as recommended. Over 40 minutes of exam, counseling,  chart review and critical decision making was performed  HPI Patient presents for a complete physical.   His blood pressure has been controlled at home, today their BP is BP: 122/80 mmHg  He has a history of LVH with normal EF, last echo 2011, and normal cardiolite 2014 and follows with Dr. Johnsie Cancel but does not care to follow with him again. He is on ASA, BB, ARB, and fluid pill. He does not workout. He denies chest pain, shortness of breath, dizziness.  He is on cholesterol medication and denies myalgias, had intolerant to statins due to elevated CPK but has been tolerating Lipitor and zetia. His cholesterol is at goal. The cholesterol last visit was:   Lab Results  Component Value Date   CHOL 127 06/09/2014   HDL 37* 06/09/2014   LDLCALC 47 06/09/2014   TRIG 217* 06/09/2014   CHOLHDL 3.4 06/09/2014  He has been working on diet and exercise for prediabetes,  and denies paresthesia of the feet, polydipsia, polyuria and visual disturbances. Last A1C in the office was:  Lab Results  Component Value Date   HGBA1C 6.0* 06/09/2014  Patient is on Vitamin D supplement.   Lab Results  Component Value Date   VD25OH 6 06/09/2014  He is on zoloft for depression, he increased it to a full pill which has helped.  He is on thyroid medication. His medication was not changed last visit.   Lab Results  Component Value Date   TSH 0.758 06/09/2014   Last PSA was, follows with Dr. Suann Larry PRN, has had elevated PSA while on testosterone, has improved energy while on the shots, he has stopped several months ago and has noticed a difference with a decrease in energy.: Lab Results  Component Value Date   PSA 0.81 07/26/2013  BMI is Body mass index is 35.47 kg/(m^2)., he is working on diet and exercise, he has sleep apnea with CPAP and hypogonadism consequence of his obesity but has lost 7 lbs in the last 3 months. . Wt Readings from Last 3 Encounters:  09/12/14 230 lb (104.327 kg)  06/09/14 237 lb 12.8  oz (107.865 kg)  04/18/14 239 lb 12.8 oz (108.773 kg)    Current Medications:  Current Outpatient Prescriptions on File Prior to Visit  Medication Sig Dispense Refill  . Ascorbic Acid (VITAMIN C) 1000 MG tablet Take 1,000 mg by mouth daily.     Marland Kitchen aspirin 325 MG tablet Take 325 mg by mouth daily.      Marland Kitchen atorvastatin (LIPITOR) 80 MG tablet Take 1 tablet (80 mg total) by mouth daily. 90 tablet 1  . B Complex Vitamins (B COMPLEX PO) Take by mouth. Takes M,W,F    . Cholecalciferol (VITAMIN D PO) Take 6,000 Int'l Units by mouth daily.    Marland Kitchen EPINEPHrine (EPIPEN 2-PAK) 0.3 mg/0.3 mL IJ SOAJ injection Inject 0.3 mLs (0.3 mg total) into the muscle once. 2 Device 0  . ezetimibe (ZETIA) 10 MG tablet Take 1 tablet (10 mg total) by mouth daily. 90 tablet 4  . fish oil-omega-3 fatty acids 1000 MG capsule Take 2 g by mouth 3 (three) times daily.     . Flaxseed, Linseed, (FLAX SEED OIL) 1000 MG CAPS Take 2,000 mg by mouth daily.    . hydrochlorothiazide (HYDRODIURIL) 25 MG tablet Take 1 tablet (25 mg total) by mouth every morning. 90 tablet PRN  . levothyroxine (SYNTHROID, LEVOTHROID) 50 MCG tablet TAKE ONE TABLET BY MOUTH ONCE DAILY 90 tablet 1  . losartan (COZAAR) 100 MG tablet TAKE 1 TABLET DAILY FOR BLOOD PRESSURE 90 tablet 99  . MAGNESIUM PO Take 650 mg by mouth daily.     . metoprolol tartrate (LOPRESSOR) 25 MG tablet Take 1 tablet (25 mg total) by mouth 2 (two) times daily. 180 tablet PRN  . Multiple Vitamin (MULTIVITAMIN) capsule Take 1 capsule by mouth daily.      . sertraline (ZOLOFT) 100 MG tablet Take 1 & 1/2 to 2 tablets daily as directed for mood 180 tablet 1  . tadalafil (CIALIS) 5 MG tablet Take 1 tablet (5 mg total) by mouth as needed for erectile dysfunction. 90 tablet 1   No current facility-administered medications on file prior to visit.   Health Maintenance:  Immunization History  Administered Date(s) Administered  . Influenza Split 03/06/2012, 02/24/2013, 03/18/2014  . PPD Test  07/26/2013  . Pneumococcal Polysaccharide-23 07/26/2013  . Pneumococcal-Unspecified 05/04/2004  . Tdap 07/24/2011  . Zoster 12/02/2013   Influenza 02/24/13 TDAP 07/24/11 Pneumovax 2015 Colonoscopy 08/03/10 due 2017 EYE Exam WNL 2014 ECHO 08/06/09 Neg cardiolite 2014 CXR 06/30/12 WNL U/S ABD 08/01/11- FATTY LIVER  Patient Care Team: Unk Pinto, MD as PCP - General (Internal Medicine) Rana Snare, MD as Consulting Physician (Urology) Irene Shipper, MD as Consulting Physician (Gastroenterology) Melvyn Novas, MD as Referring Physician (Neurology) Lavonna Monarch, MD as Consulting Physician (Dermatology) Dyke Maes, OD (Optometry) Josue Hector, MD as Consulting Physician (Cardiology)  Allergies:  Allergies  Allergen Reactions  . Ace Inhibitors     cough  . Bee Venom    Medical History:  Past Medical History  Diagnosis Date  . Cardiomegaly   . Mixed hyperlipidemia   . Hypertension   . Hypogonadism male   . Sleep apnea   . Obesity   .  Colon polyp   . Gallstones   . Pancreas (digestive gland) works poorly   . Prediabetes   . Vitamin D deficiency   . Fatty liver disease, nonalcoholic    Surgical History:  Past Surgical History  Procedure Laterality Date  . Cholecystectomy  1976  . Appendectomy  1964  . Ankle fracture surgery Left 1986  . Carpal tunnel release Bilateral 1996   Family History:  Family History  Problem Relation Age of Onset  . Diabetes Mother   . Diabetes Father     brother and sister  . Heart disease Father   . Hypertension Father   . Diabetes Sister    Social History:   History  Substance Use Topics  . Smoking status: Former Smoker -- 2.00 packs/day for 7 years    Types: Cigarettes    Quit date: 05/27/1974  . Smokeless tobacco: Never Used  . Alcohol Use: No   Review of Systems:  Review of Systems  Constitutional: Negative.   HENT: Negative.   Eyes: Negative.   Respiratory: Negative.   Cardiovascular: Negative.    Gastrointestinal: Negative.   Genitourinary: Negative.   Musculoskeletal: Negative.   Skin: Negative.   Neurological: Negative.   Endo/Heme/Allergies: Negative.   Psychiatric/Behavioral: Negative.     Physical Exam: Estimated body mass index is 35.47 kg/(m^2) as calculated from the following:   Height as of this encounter: 5' 7.5" (1.715 m).   Weight as of this encounter: 230 lb (104.327 kg). BP 122/80 mmHg  Pulse 58  Temp(Src) 98 F (36.7 C) (Temporal)  Resp 18  Ht 5' 7.5" (1.715 m)  Wt 230 lb (104.327 kg)  BMI 35.47 kg/m2 General Appearance: Well nourished, in no apparent distress.  Eyes: PERRLA, EOMs, conjunctiva no swelling or erythema, normal fundi and vessels.  Sinuses: No Frontal/maxillary tenderness  ENT/Mouth: Ext aud canals clear except right ear with cerumen impaction, normal light reflex with TMs without erythema, bulging. Good dentition. No erythema, swelling, or exudate on post pharynx. Tonsils not swollen or erythematous. Hearing normal.  Neck: Supple, thyroid normal. No bruits  Respiratory: Respiratory effort normal, BS equal bilaterally without rales, rhonchi, wheezing or stridor.  Cardio: RRR without murmurs, rubs or gallops. Brisk peripheral pulses without edema.  Chest: symmetric, with normal excursions and percussion.  Abdomen: Soft, obese, nontender, with large well healing vertical and right horizontal scar no guarding, rebound, hernias, masses, or organomegaly.  Lymphatics: Non tender without lymphadenopathy.  Genitourinary: defer Musculoskeletal: Full ROM all peripheral extremities,5/5 strength, and normal gait.  Skin: Several actinic keratosis on his head and he has a bleeding ulcer different colors on anterior chest/stomach.  Warm, dry without rashes, lesions, ecchymosis. Neuro: Cranial nerves intact, reflexes decreased bilateral legs, worse left than right with decrease dorsiflexion/plantar on left. Normal muscle tone, no cerebellar symptoms. Sensation  intact.  Psych: Awake and oriented X 3, normal affect, Insight and Judgment appropriate.   EKG: WNL no changes. AORTA SCAN: WNL  Vicie Mutters 10:16 AM [facility]

## 2014-09-12 NOTE — Patient Instructions (Addendum)
Use a dropper or use a cap to put olive oil,mineral oil or canola oil in the effected ear- 2-3 times a week. Let it soak for 20-30 min then you can take a shower or use a baby bulb with warm water to wash out the ear wax.  Do not use Qtips  Four common causes of cough:  The losartan you are on may cause a cough, stop the and start on the samples of benicar that i will give you for 1-2 weeks, if the cough is better I will send in a generic called Diovan.    Allergies, Viral Infections, Acid Reflux and Bacterial Infections. 1) Allergies and viral infections cause a cough by post nasal drip and are often worse at night, can also have sneezing, lower grade fevers, clear/yellow mucus. This is best treated with allergy medications or nasal sprays.  Please pick one of the over the counter allergy medications below and take it once daily for allergies.  Claritin or loratadine cheapest but likely the weakest  Zyrtec or certizine at night because it can make you sleepy The strongest is allegra or fexafinadine  Cheapest at [employer], [employer], [employer]  2) Bacterial infections are more severe than allergies or viral infections with fever, teeth pain, fatigue. This can be treated with prednisone and the same over the counter medication and after 7 days an antibiotic.  3) Silent reflux/GERD can cause a cough WITHOUT heart burn because the esophagus that goes to the stomach and trachea that goes to the lungs are very close and when you lay down the acid can irritate your throat and lungs. This can cause hoarseness, cough, and wheezing. Please stop any alcohol or anti-inflammatories like aleve/advil/ibuprofen and start an over the counter Prilosec or omeprazole 1-2 times daily 58mins before food for 2 weeks, then switch to over the counter zantac/ratinidine or pepcid/famotadine once at night for 2 weeks.   4) sometimes irritation causes more irritation. Try voice rest, use sugar free cough drops to prevent coughing,  and try to stop clearing your throat.   If you ever have a cough that does not go away after trying these things please make a follow up visit for further evaluation or we can refer you to a specialist. Or if you ever have shortness of breath or chest pain go to the ER.     Before you even begin to attack a weight-loss plan, it pays to remember this: You are not fat. You have fat. Losing weight isn't about blame or shame; it's simply another achievement to accomplish. Dieting is like any other skill-you have to buckle down and work at it. As long as you act in a smart, reasonable way, you'll ultimately get where you want to be. Here are some weight loss pearls for you.  1. It's Not a Diet. It's a Lifestyle Thinking of a diet as something you're on and suffering through only for the short term doesn't work. To shed weight and keep it off, you need to make permanent changes to the way you eat. It's OK to indulge occasionally, of course, but if you cut calories temporarily and then revert to your old way of eating, you'll gain back the weight quicker than you can say yo-yo. Use it to lose it. Research shows that one of the best predictors of long-term weight loss is how many pounds you drop in the first month. For that reason, nutritionists often suggest being stricter for the first two weeks of your  new eating strategy to build momentum. Cut out added sugar and alcohol and avoid unrefined carbs. After that, figure out how you can reincorporate them in a way that's healthy and maintainable.  2. There's a Right Way to Exercise Working out burns calories and fat and boosts your metabolism by building muscle. But those trying to lose weight are notorious for overestimating the number of calories they burn and underestimating the amount they take in. Unfortunately, your system is biologically programmed to hold on to extra pounds and that means when you start exercising, your body senses the deficit and ramps up  its hunger signals. If you're not diligent, you'll eat everything you burn and then some. Use it to lose it. Cardio gets all the exercise glory, but strength and interval training are the real heroes. They help you build lean muscle, which in turn increases your metabolism and calorie-burning ability 3. Don't Overreact to Mild Hunger Some people have a hard time losing weight because of hunger anxiety. To them, being hungry is bad-something to be avoided at all costs-so they carry snacks with them and eat when they don't need to. Others eat because they're stressed out or bored. While you never want to get to the point of being ravenous (that's when bingeing is likely to happen), a hunger pang, a craving, or the fact that it's 3:00 p.m. should not send you racing for the vending machine or obsessing about the energy bar in your purse. Ideally, you should put off eating until your stomach is growling and it's difficult to concentrate.  Use it to lose it. When you feel the urge to eat, use the HALT method. Ask yourself, Am I really hungry? Or am I angry or anxious, lonely or bored, or tired? If you're still not certain, try the apple test. If you're truly hungry, an apple should seem delicious; if it doesn't, something else is going on. Or you can try drinking water and making yourself busy, if you are still hungry try a healthy snack.  4. Not All Calories Are Created Equal The mechanics of weight loss are pretty simple: Take in fewer calories than you use for energy. But the kind of food you eat makes all the difference. Processed food that's high in saturated fat and refined starch or sugar can cause inflammation that disrupts the hormone signals that tell your brain you're full. The result: You eat a lot more.  Use it to lose it. Clean up your diet. Swap in whole, unprocessed foods, including vegetables, lean protein, and healthy fats that will fill you up and give you the biggest nutritional bang for your  calorie buck. In a few weeks, as your brain starts receiving regular hunger and fullness signals once again, you'll notice that you feel less hungry overall and naturally start cutting back on the amount you eat.  5. Protein, Produce, and Plant-Based Fats Are Your Weight-Loss Trinity Here's why eating the three Ps regularly will help you drop pounds. Protein fills you up. You need it to build lean muscle, which keeps your metabolism humming so that you can torch more fat. People in a weight-loss program who ate double the recommended daily allowance for protein (about 110 grams for a 150-pound woman) lost 70 percent of their weight from fat, while people who ate the RDA lost only about 40 percent, one study found. Produce is packed with filling fiber. "It's very difficult to consume too many calories if you're eating a lot of vegetables.  Example: Three cups of broccoli is a lot of food, yet only 93 calories. (Fruit is another story. It can be easy to overeat and can contain a lot of calories from sugar, so be sure to monitor your intake.) Plant-based fats like olive oil and those in avocados and nuts are healthy and extra satiating.  Use it to lose it. Aim to incorporate each of the three Ps into every meal and snack. People who eat protein throughout the day are able to keep weight off, according to a study in the [place] of Clinical Nutrition. In addition to meat, poultry and seafood, good sources are beans, lentils, eggs, tofu, and yogurt. As for fat, keep portion sizes in check by measuring out salad dressing, oil, and nut butters (shoot for one to two tablespoons). Finally, eat veggies or a little fruit at every meal. People who did that consumed 308 fewer calories but didn't feel any hungrier than when they didn't eat more produce.  7. How You Eat Is As Important As What You Eat In order for your brain to register that you're full, you need to focus on what you're eating. Sit down whenever  you eat, preferably at a table. Turn off the TV or computer, put down your phone, and look at your food. Smell it. Chew slowly, and don't put another bite on your fork until you swallow. When women ate lunch this attentively, they consumed 30 percent less when snacking later than those who listened to an audiobook at lunchtime, according to a study in the [place] of Nutrition. 8. Weighing Yourself Really Works The scale provides the best evidence about whether your efforts are paying off. Seeing the numbers tick up or down or stagnate is motivation to keep going-or to rethink your approach. A 2015 study at [facility] found that daily weigh-ins helped people lose more weight, keep it off, and maintain that loss, even after two years. Use it to lose it. Step on the scale at the same time every day for the best results. If your weight shoots up several pounds from one weigh-in to the next, don't freak out. Eating a lot of salt the night before or having your period is the likely culprit. The number should return to normal in a day or two. It's a steady climb that you need to do something about. 9. Too Much Stress and Too Little Sleep Are Your Enemies When you're tired and frazzled, your body cranks up the production of cortisol, the stress hormone that can cause carb cravings. Not getting enough sleep also boosts your levels of ghrelin, a hormone associated with hunger, while suppressing leptin, a hormone that signals fullness and satiety. People on a diet who slept only five and a half hours a night for two weeks lost 55 percent less fat and were hungrier than those who slept eight and a half hours, according to a study in the [place]. Use it to lose it. Prioritize sleep, aiming for seven hours or more a night, which research shows helps lower stress. And make sure you're getting quality zzz's. If a snoring spouse or a fidgety cat wakes you up frequently throughout  the night, you may end up getting the equivalent of just four hours of sleep, according to a study from [facility]. Keep pets out of the bedroom, and use a white-noise app to drown out snoring. 10. You Will Hit a plateau-And You Can Bust Through It As you  slim down, your body releases much less leptin, the fullness hormone.  If you're not strength training, start right now. Building muscle can raise your metabolism to help you overcome a plateau. To keep your body challenged and burning calories, incorporate new moves and more intense intervals into your workouts or add another sweat session to your weekly routine. Alternatively, cut an extra 100 calories or so a day from your diet. Now that you've lost weight, your body simply doesn't need as much fuel.      Bad carbs also include fruit juice, alcohol, and sweet tea. These are empty calories that do not signal to your brain that you are full.   Please remember the good carbs are still carbs which convert into sugar. So please measure them out no more than 1/2-1 cup of rice, oatmeal, pasta, and beans  Veggies are however free foods! Pile them on.   Not all fruit is created equal. Please see the list below, the fruit at the bottom is higher in sugars than the fruit at the top. Please avoid all dried fruits.

## 2014-09-13 LAB — LIPID PANEL
CHOLESTEROL: 132 mg/dL (ref 0–200)
HDL: 41 mg/dL (ref >=40)
LDL Cholesterol: 54 mg/dL (ref 0–99)
Total CHOL/HDL Ratio: 3.2 Ratio
Triglycerides: 183 mg/dL — ABNORMAL HIGH (ref <150)
VLDL: 37 mg/dL (ref 0–40)

## 2014-09-13 LAB — BASIC METABOLIC PANEL WITH GFR
BUN: 25 mg/dL — AB (ref 6–23)
CALCIUM: 10 mg/dL (ref 8.4–10.5)
CO2: 28 mEq/L (ref 19–32)
CREATININE: 0.87 mg/dL (ref 0.50–1.35)
Chloride: 102 mEq/L (ref 96–112)
GLUCOSE: 91 mg/dL (ref 70–99)
POTASSIUM: 4.1 meq/L (ref 3.5–5.3)
Sodium: 143 mEq/L (ref 135–145)

## 2014-09-13 LAB — HEPATIC FUNCTION PANEL
ALBUMIN: 4.7 g/dL (ref 3.5–5.2)
ALK PHOS: 65 U/L (ref 39–117)
ALT: 43 U/L (ref 0–53)
AST: 35 U/L (ref 0–37)
Bilirubin, Direct: 0.1 mg/dL (ref 0.0–0.3)
Indirect Bilirubin: 0.5 mg/dL (ref 0.2–1.2)
TOTAL PROTEIN: 7.5 g/dL (ref 6.0–8.3)
Total Bilirubin: 0.6 mg/dL (ref 0.2–1.2)

## 2014-09-13 LAB — CK: CK TOTAL: 374 U/L — AB (ref 7–232)

## 2014-09-13 LAB — INSULIN, FASTING: INSULIN FASTING, SERUM: 13.2 u[IU]/mL (ref 2.0–19.6)

## 2014-09-13 LAB — VITAMIN D 25 HYDROXY (VIT D DEFICIENCY, FRACTURES): Vit D, 25-Hydroxy: 72 ng/mL (ref 30–100)

## 2014-09-13 LAB — PSA: PSA: 0.63 ng/mL (ref <=4.00)

## 2014-09-13 LAB — TSH: TSH: 0.914 u[IU]/mL (ref 0.350–4.500)

## 2014-09-13 LAB — MICROALBUMIN / CREATININE URINE RATIO
CREATININE, URINE: 217.9 mg/dL
Microalb Creat Ratio: 9.2 mg/g (ref 0.0–30.0)
Microalb, Ur: 2 mg/dL (ref <2.0)

## 2014-09-13 LAB — TESTOSTERONE: Testosterone: 246 ng/dL — ABNORMAL LOW (ref 300–890)

## 2014-09-13 LAB — MAGNESIUM: MAGNESIUM: 2.1 mg/dL (ref 1.5–2.5)

## 2014-09-19 ENCOUNTER — Other Ambulatory Visit: Payer: Self-pay

## 2014-09-19 MED ORDER — VALSARTAN 320 MG PO TABS: ORAL_TABLET | ORAL | Status: DC

## 2014-09-26 ENCOUNTER — Other Ambulatory Visit: Payer: Self-pay | Admitting: *Deleted

## 2014-09-26 DIAGNOSIS — Z1212 Encounter for screening for malignant neoplasm of rectum: Secondary | ICD-10-CM

## 2014-09-26 LAB — POC HEMOCCULT BLD/STL (HOME/3-CARD/SCREEN)
FECAL OCCULT BLD: NEGATIVE
FECAL OCCULT BLD: NEGATIVE
FECAL OCCULT BLD: NEGATIVE

## 2014-10-14 ENCOUNTER — Other Ambulatory Visit: Payer: Self-pay | Admitting: Physician Assistant

## 2014-10-14 ENCOUNTER — Ambulatory Visit (INDEPENDENT_AMBULATORY_CARE_PROVIDER_SITE_OTHER): Payer: BLUE CROSS/BLUE SHIELD | Admitting: Physician Assistant

## 2014-10-14 ENCOUNTER — Encounter: Payer: Self-pay | Admitting: Physician Assistant

## 2014-10-14 VITALS — BP 120/80 | HR 60 | Temp 97.7°F | Resp 16 | Ht 67.5 in | Wt 237.0 lb

## 2014-10-14 DIAGNOSIS — R748 Abnormal levels of other serum enzymes: Secondary | ICD-10-CM

## 2014-10-14 DIAGNOSIS — E782 Mixed hyperlipidemia: Secondary | ICD-10-CM

## 2014-10-14 DIAGNOSIS — L570 Actinic keratosis: Secondary | ICD-10-CM

## 2014-10-14 DIAGNOSIS — D225 Melanocytic nevi of trunk: Secondary | ICD-10-CM

## 2014-10-14 DIAGNOSIS — D235 Other benign neoplasm of skin of trunk: Secondary | ICD-10-CM

## 2014-10-14 LAB — LIPID PANEL
Cholesterol: 204 mg/dL — ABNORMAL HIGH (ref 0–200)
HDL: 34 mg/dL — ABNORMAL LOW
Total CHOL/HDL Ratio: 6 ratio
Triglycerides: 831 mg/dL — ABNORMAL HIGH

## 2014-10-14 LAB — CK: Total CK: 338 U/L — ABNORMAL HIGH (ref 7–232)

## 2014-10-14 NOTE — Progress Notes (Signed)
Chief Complaint: Patient presents for evaluation of a skin lesion.  In addition last OV he was complaining of muscle pain, he was taken off his lipitor 09/12/2014 due to elevated CPK and is here for a recheck, he has had less myalgias since the pain. Several actinic keratosis on his head and he has a bleeding ulcer different colors on anterior chest/stomach.  Exam: Border: abnormal Color: variegated/multicolored Size: 4 mm Location: center AB at xyphoid process   Anesthesia: Marcaine   Procedure Details   The risks, benefits, indications, potential complications, and alternatives were explained to the patient and informed consent obtained.  ELECTRO: The lesion and surrounding area was given sterile prep using alcohol and draped in the usual sterile fashion. A 11 blade was used to excise an elliptical area of skin approximately 5cm by 5cm. The wound was closed with electrocaudry. Antibiotic ointment and a sterile dressing applied. The specimen was sent for pathologic examination. The patient tolerated the procedure well with minimal blood loss.   Condition: Stable  Complications:  None  Diagnosis: Hyperlipidemia Elevated CPK Atypical nevus  Procedure code:  [id number]  Plan: 1. Instructed to keep the wound dry and covered for 24-48 hours and clean thereafter. 2. Warning signs of infection were reviewed.

## 2014-10-14 NOTE — Patient Instructions (Signed)
Actinic Keratosis Actinic keratosis is a precancerous growth on the skin. This means it could develop into skin cancer if it is not treated. About 1% of actinic keratoses turn into skin cancer within a year. It is important to have all such growths removed to prevent them from developing into skin cancer. CAUSES  Actinic keratosis is caused by getting too much ultraviolet (UV) radiation from the sun or other UV light sources. RISK FACTORS Factors that increase your chances of getting actinic keratosis include:  Having light-colored skin and blue eyes.  Having blonde or red hair.  Spending a lot of time in the sun.  Age. The risk of actinic keratosis increases with age. SYMPTOMS  Actinic keratosis growths look like scaly, rough spots of skin. They can be as small as a pinhead or as big as a quarter. They may itch, hurt, or feel sensitive. Sometimes there is a little tag of pink or gray skin growing off them. In some cases, actinic keratoses are easier felt than seen. They do not go away with the use of moisturizing lotions or creams. Actinic keratoses appear most often on areas of skin that get a lot of sun exposure. These areas include the:  Scalp.  Face.  Ears.  Lips.  Upper back.  Backs of the hands.  Forearms. DIAGNOSIS  Your health care provider can usually tell what is wrong by performing a physical exam. A tissue sample (biopsy) may also be taken and examined under a microscope. TREATMENT  Actinic keratosis can be treated several ways. Most treatments can be done in your health care provider's office. Treatment options may include:  Curettage. A tool is used to gently scrape off the growth.  Cryosurgery. Liquid nitrogen is applied to the growth to freeze it. The growth eventually falls off the skin.  Medicated creams, such as 5-fluorouracil or imiquimod. The medicine destroys the cells in the growth.  Chemical peels. Chemicals are applied to the growth and the outer  layers of skin are peeled off.  Photodynamic therapy. A drug that makes your skin more sensitive to light is applied to the skin. A strong, blue light is aimed at the skin and destroys the growth. PREVENTION  To prevent future sun damage:  Try to avoid the sun between 10:00 a.m. and 4:00 p.m. when it is the strongest.  Use a sunscreen or sunblock with SPF 30 or greater.  Apply sunscreen at least 30 minutes before exposure to the sun.  Always wear protective hats, clothing, and sunglasses with UV protection.  Avoid medicines, herbs, and foods that increase your sensitivity to sunlight.  Avoid tanning beds. HOME CARE INSTRUCTIONS   If your skin was covered with a bandage, change and remove the bandage as directed by your health care provider.  Keep the treated area dry as directed by your health care provider.  Apply any creams as prescribed by your health care provider. Follow the directions carefully.  Check your skin regularly for any changes.  Visit a skin doctor (dermatologist) every year for a skin exam. SEEK MEDICAL CARE IF:   Your skin does not heal and becomes irritated, red, or bleeds.  You notice any changes or new growths on your skin. Document Released: 08/09/2008 Document Revised: 09/27/2013 Document Reviewed: 06/24/2011 [facility] Patient Information 2015 [place], [place]. This information is not intended to replace advice given to you by your health care provider. Make sure you discuss any questions you have with your health care provider.   Mole Excision  Your caregiver has removed (excised) a mole. Most moles are benign (non cancerous). Some moles may change over time and require biopsy (tissue sample) or removal. The mole usually is removed by shaving or cutting it from the skin. You will have stitches in your skin if the mole is large. A small mole, or one that is shaved off, may require only a small bandage. Your caregiver will send a piece of the mole to the  laboratory (pathology) to examine it under a microscope for signs of cancer. Make sure you get your biopsy results when you return for your follow-up visit. Call if there is no return visit. HOME CARE INSTRUCTIONS   If the biopsied area was the arm or leg, keep it raised (above the level of your heart) to decrease pain and swelling, if you are having any.  Keep the wound and dressing clean and dry. Clean as necessary.  If the dressing gets wet, remove it slowly and carefully. If it sticks, use warm, soapy water to gently loosen it. Pat the area dry with a clean towel before putting on another dressing.  Return in 7 days or as directed to have your sutures (stitches) removed.  Call in 3 to 4 days, or as directed, for the results of your biopsy. SEEK IMMEDIATE MEDICAL CARE IF:   You have a fever.  You have excess blood soaking through the dressing.  You have increasing pain and swelling in the wound.  You have numbness or swelling below the wound.  You have redness, swelling, pus, a bad smell, or red streaks coming away from the wound, or any other signs of infection. MAKE SURE YOU:   Understand these instructions.  Will watch your condition.  Will get help right away if you are not doing well or get worse. Document Released: 05/10/2000 Document Revised: 08/05/2011 Document Reviewed: 04/15/2007 [facility] Patient Information 2015 [place], [place]. This information is not intended to replace advice given to you by your health care provider. Make sure you discuss any questions you have with your health care provider.

## 2014-10-17 MED ORDER — FENOFIBRATE 145 MG PO TABS: 145.0000 mg | ORAL_TABLET | Freq: Every day | ORAL | Status: DC

## 2014-10-17 NOTE — Addendum Note (Signed)
Addended by: Vladimir Crofts on: 10/17/2014 08:43 AM   Modules accepted: Orders

## 2014-11-08 ENCOUNTER — Other Ambulatory Visit: Payer: Self-pay | Admitting: *Deleted

## 2014-11-08 MED ORDER — FENOFIBRATE 145 MG PO TABS: 145.0000 mg | ORAL_TABLET | Freq: Every day | ORAL | Status: DC

## 2014-11-08 MED ORDER — VALSARTAN 320 MG PO TABS: ORAL_TABLET | ORAL | Status: DC

## 2014-11-09 ENCOUNTER — Other Ambulatory Visit: Payer: Self-pay

## 2014-11-09 MED ORDER — VALSARTAN 320 MG PO TABS: ORAL_TABLET | ORAL | Status: DC

## 2014-11-09 MED ORDER — FENOFIBRATE 145 MG PO TABS: 145.0000 mg | ORAL_TABLET | Freq: Every day | ORAL | Status: DC

## 2014-11-14 ENCOUNTER — Other Ambulatory Visit: Payer: Self-pay

## 2014-11-14 MED ORDER — VALSARTAN 320 MG PO TABS: ORAL_TABLET | ORAL | Status: DC

## 2014-11-14 MED ORDER — FENOFIBRATE 145 MG PO TABS: 145.0000 mg | ORAL_TABLET | Freq: Every day | ORAL | Status: DC

## 2014-12-12 ENCOUNTER — Ambulatory Visit (INDEPENDENT_AMBULATORY_CARE_PROVIDER_SITE_OTHER): Payer: BLUE CROSS/BLUE SHIELD | Admitting: Physician Assistant

## 2014-12-12 ENCOUNTER — Encounter: Payer: Self-pay | Admitting: Physician Assistant

## 2014-12-12 VITALS — BP 126/82 | HR 64 | Temp 97.7°F | Resp 16 | Ht 67.5 in | Wt 231.8 lb

## 2014-12-12 DIAGNOSIS — E291 Testicular hypofunction: Secondary | ICD-10-CM

## 2014-12-12 DIAGNOSIS — Z79899 Other long term (current) drug therapy: Secondary | ICD-10-CM

## 2014-12-12 DIAGNOSIS — R7309 Other abnormal glucose: Secondary | ICD-10-CM

## 2014-12-12 DIAGNOSIS — I10 Essential (primary) hypertension: Secondary | ICD-10-CM

## 2014-12-12 DIAGNOSIS — E669 Obesity, unspecified: Secondary | ICD-10-CM

## 2014-12-12 DIAGNOSIS — R7303 Prediabetes: Secondary | ICD-10-CM

## 2014-12-12 DIAGNOSIS — E349 Endocrine disorder, unspecified: Secondary | ICD-10-CM

## 2014-12-12 DIAGNOSIS — E039 Hypothyroidism, unspecified: Secondary | ICD-10-CM

## 2014-12-12 DIAGNOSIS — E559 Vitamin D deficiency, unspecified: Secondary | ICD-10-CM

## 2014-12-12 DIAGNOSIS — E782 Mixed hyperlipidemia: Secondary | ICD-10-CM

## 2014-12-12 LAB — CBC WITH DIFFERENTIAL/PLATELET
Basophils Absolute: 0 10*3/uL (ref 0.0–0.1)
Basophils Relative: 1 % (ref 0–1)
Eosinophils Absolute: 0.2 10*3/uL (ref 0.0–0.7)
Eosinophils Relative: 5 % (ref 0–5)
HCT: 40.9 % (ref 39.0–52.0)
Hemoglobin: 13.8 g/dL (ref 13.0–17.0)
Lymphocytes Relative: 25 % (ref 12–46)
Lymphs Abs: 0.9 10*3/uL (ref 0.7–4.0)
MCH: 30.1 pg (ref 26.0–34.0)
MCHC: 33.7 g/dL (ref 30.0–36.0)
MCV: 89.1 fL (ref 78.0–100.0)
MPV: 9.4 fL (ref 8.6–12.4)
Monocytes Absolute: 0.6 10*3/uL (ref 0.1–1.0)
Monocytes Relative: 15 % — ABNORMAL HIGH (ref 3–12)
NEUTROS ABS: 2 10*3/uL (ref 1.7–7.7)
Neutrophils Relative %: 54 % (ref 43–77)
PLATELETS: 238 10*3/uL (ref 150–400)
RBC: 4.59 MIL/uL (ref 4.22–5.81)
RDW: 15.2 % (ref 11.5–15.5)
WBC: 3.7 10*3/uL — ABNORMAL LOW (ref 4.0–10.5)

## 2014-12-12 NOTE — Patient Instructions (Addendum)
Before you even begin to attack a weight-loss plan, it pays to remember this: You are not fat. You have fat. Losing weight isn't about blame or shame; it's simply another achievement to accomplish. Dieting is like any other skill-you have to buckle down and work at it. As long as you act in a smart, reasonable way, you'll ultimately get where you want to be. Here are some weight loss pearls for you.  1. It's Not a Diet. It's a Lifestyle Thinking of a diet as something you're on and suffering through only for the short term doesn't work. To shed weight and keep it off, you need to make permanent changes to the way you eat. It's OK to indulge occasionally, of course, but if you cut calories temporarily and then revert to your old way of eating, you'll gain back the weight quicker than you can say yo-yo. Use it to lose it. Research shows that one of the best predictors of long-term weight loss is how many pounds you drop in the first month. For that reason, nutritionists often suggest being stricter for the first two weeks of your new eating strategy to build momentum. Cut out added sugar and alcohol and avoid unrefined carbs. After that, figure out how you can reincorporate them in a way that's healthy and maintainable.  2. There's a Right Way to Exercise Working out burns calories and fat and boosts your metabolism by building muscle. But those trying to lose weight are notorious for overestimating the number of calories they burn and underestimating the amount they take in. Unfortunately, your system is biologically programmed to hold on to extra pounds and that means when you start exercising, your body senses the deficit and ramps up its hunger signals. If you're not diligent, you'll eat everything you burn and then some. Use it to lose it. Cardio gets all the exercise glory, but strength and interval training are the real heroes. They help you build lean muscle, which in turn increases your metabolism and  calorie-burning ability 3. Don't Overreact to Mild Hunger Some people have a hard time losing weight because of hunger anxiety. To them, being hungry is bad-something to be avoided at all costs-so they carry snacks with them and eat when they don't need to. Others eat because they're stressed out or bored. While you never want to get to the point of being ravenous (that's when bingeing is likely to happen), a hunger pang, a craving, or the fact that it's 3:00 p.m. should not send you racing for the vending machine or obsessing about the energy bar in your purse. Ideally, you should put off eating until your stomach is growling and it's difficult to concentrate.  Use it to lose it. When you feel the urge to eat, use the HALT method. Ask yourself, Am I really hungry? Or am I angry or anxious, lonely or bored, or tired? If you're still not certain, try the apple test. If you're truly hungry, an apple should seem delicious; if it doesn't, something else is going on. Or you can try drinking water and making yourself busy, if you are still hungry try a healthy snack.  4. Not All Calories Are Created Equal The mechanics of weight loss are pretty simple: Take in fewer calories than you use for energy. But the kind of food you eat makes all the difference. Processed food that's high in saturated fat and refined starch or sugar can cause inflammation that disrupts the hormone signals that tell  your brain you're full. The result: You eat a lot more.  Use it to lose it. Clean up your diet. Swap in whole, unprocessed foods, including vegetables, lean protein, and healthy fats that will fill you up and give you the biggest nutritional bang for your calorie buck. In a few weeks, as your brain starts receiving regular hunger and fullness signals once again, you'll notice that you feel less hungry overall and naturally start cutting back on the amount you eat.  5. Protein, Produce, and Plant-Based Fats Are Your Weight-Loss  [place] Here's why eating the three Ps regularly will help you drop pounds. Protein fills you up. You need it to build lean muscle, which keeps your metabolism humming so that you can torch more fat. People in a weight-loss program who ate double the recommended daily allowance for protein (about 110 grams for a 150-pound woman) lost 70 percent of their weight from fat, while people who ate the RDA lost only about 40 percent, one study found. Produce is packed with filling fiber. "It's very difficult to consume too many calories if you're eating a lot of vegetables. Example: Three cups of broccoli is a lot of food, yet only 93 calories. (Fruit is another story. It can be easy to overeat and can contain a lot of calories from sugar, so be sure to monitor your intake.) Plant-based fats like olive oil and those in avocados and nuts are healthy and extra satiating.  Use it to lose it. Aim to incorporate each of the three Ps into every meal and snack. People who eat protein throughout the day are able to keep weight off, according to a study in the American Journal of Clinical Nutrition. In addition to meat, poultry and seafood, good sources are beans, lentils, eggs, tofu, and yogurt. As for fat, keep portion sizes in check by measuring out salad dressing, oil, and nut butters (shoot for one to two tablespoons). Finally, eat veggies or a little fruit at every meal. People who did that consumed 308 fewer calories but didn't feel any hungrier than when they didn't eat more produce.  7. How You Eat Is As Important As What You Eat In order for your brain to register that you're full, you need to focus on what you're eating. Sit down whenever you eat, preferably at a table. Turn off the TV or computer, put down your phone, and look at your food. Smell it. Chew slowly, and don't put another bite on your fork until you swallow. When women ate lunch this attentively, they consumed 30 percent less when snacking later than  those who listened to an audiobook at lunchtime, according to a study in the British Journal of Nutrition. 8. Weighing Yourself Really Works The scale provides the best evidence about whether your efforts are paying off. Seeing the numbers tick up or down or stagnate is motivation to keep going-or to rethink your approach. A 2015 study at [employer] found that daily weigh-ins helped people lose more weight, keep it off, and maintain that loss, even after two years. Use it to lose it. Step on the scale at the same time every day for the best results. If your weight shoots up several pounds from one weigh-in to the next, don't freak out. Eating a lot of salt the night before or having your period is the likely culprit. The number should return to normal in a day or two. It's a steady climb that you need to do something about.   9. Too Much Stress and Too Little Sleep Are Your Enemies When you're tired and frazzled, your body cranks up the production of cortisol, the stress hormone that can cause carb cravings. Not getting enough sleep also boosts your levels of ghrelin, a hormone associated with hunger, while suppressing leptin, a hormone that signals fullness and satiety. People on a diet who slept only five and a half hours a night for two weeks lost 55 percent less fat and were hungrier than those who slept eight and a half hours, according to a study in the [employer] Medical Association Journal. Use it to lose it. Prioritize sleep, aiming for seven hours or more a night, which research shows helps lower stress. And make sure you're getting quality zzz's. If a snoring spouse or a fidgety cat wakes you up frequently throughout the night, you may end up getting the equivalent of just four hours of sleep, according to a study from [employer]. Keep pets out of the bedroom, and use a white-noise app to drown out snoring. 10. You Will Hit a plateau-And You Can Bust Through It As you slim down, your  body releases much less leptin, the fullness hormone.  If you're not strength training, start right now. Building muscle can raise your metabolism to help you overcome a plateau. To keep your body challenged and burning calories, incorporate new moves and more intense intervals into your workouts or add another sweat session to your weekly routine. Alternatively, cut an extra 100 calories or so a day from your diet. Now that you've lost weight, your body simply doesn't need as much fuel.   Ways to cut 100 calories  1. Eat your eggs with hot sauce OR salsa instead of cheese.  Eggs are great for breakfast, but many people consider eggs and cheese to be BFFs. Instead of cheese-1 oz. of cheddar has 114 calories-top your eggs with hot sauce, which contains no calories and helps with satiety and metabolism. Salsa is also a great option!!  2. Top your toast, waffles or pancakes with mashed berries instead of jelly or syrup. Half a cup of berries-fresh, frozen or thawed-has about 40 calories, compared with 2 tbsp. of maple syrup or jelly, which both have about 100 calories. The berries will also give you a good punch of fiber, which helps keep you full and satisfied and won't spike blood sugar quickly like the jelly or syrup. 3. Swap the non-fat latte for black coffee with a splash of half-and-half. Contrary to its name, that non-fat latte has 130 calories and a startling 19g of carbohydrates per 16 oz. serving. Replacing that 'light' drinkable dessert with a black coffee with a splash of half-and-half saves you more than 100 calories per 16 oz. serving. 4. Sprinkle salads with freeze-dried raspberries instead of dried cranberries. If you want a sweet addition to your nutritious salad, stay away from dried cranberries. They have a whopping 130 calories per  cup and 30g carbohydrates. Instead, sprinkle freeze-dried raspberries guilt-free and save more than 100 calories per  cup serving, adding 3g of belly-filling  fiber. 5. Go for mustard in place of mayo on your sandwich. Mustard can add really nice flavor to any sandwich, and there are tons of varieties, from spicy to honey. A serving of mayo is 95 calories, versus 10 calories in a serving of mustard. 6. Choose a DIY salad dressing instead of the store-bought kind. Mix Dijon or whole grain mustard with low-fat Kefir or red wine vinegar   and garlic. 7. Use hummus as a spread instead of a dip. Use hummus as a spread on a high-fiber cracker or tortilla with a sandwich and save on calories without sacrificing taste. 8. Pick just one salad "accessory." Salad isn't automatically a calorie winner. It's easy to over-accessorize with toppings. Instead of topping your salad with nuts, avocado and cranberries (all three will clock in at 313 calories), just pick one. The next day, choose a different accessory, which will also keep your salad interesting. You don't wear all your jewelry every day, right? 9. Ditch the white pasta in favor of spaghetti squash. One cup of cooked spaghetti squash has about 40 calories, compared with traditional spaghetti, which comes with more than 200. Spaghetti squash is also nutrient-dense. It's a good source of fiber and Vitamins A and C, and it can be eaten just like you would eat pasta-with a great tomato sauce and [place] meatballs or with pesto, tofu and spinach, for example. 10. Dress up your chili, soups and stews with non-fat [place] yogurt instead of sour cream. Just a 'dollop' of sour cream can set you back 115 calories and a whopping 12g of fat-seven of which are of the artery-clogging variety. Added bonus: [place] yogurt is packed with muscle-building protein, calcium and B Vitamins. 11. Mash cauliflower instead of mashed potatoes. One cup of traditional mashed potatoes-in all their creamy goodness-has more than 200 calories, compared to mashed cauliflower, which you can typically eat for less than 100 calories per 1 cup serving.  Cauliflower is a great source of the antioxidant indole-3-carbinol (I3C), which may help reduce the risk of some cancers, like breast cancer. 12. Ditch the ice cream sundae in favor of a [place] yogurt parfait. Instead of a cup of ice cream or fro-yo for dessert, try 1 cup of nonfat Greek yogurt topped with fresh berries and a sprinkle of cacao nibs. Both toppings are packed with antioxidants, which can help reduce cellular inflammation and oxidative damage. And the comparison is a no-brainer: One cup of ice cream has about 275 calories; one cup of frozen yogurt has about 230; and a cup of Greek yogurt has just 130, plus twice the protein, so you're less likely to return to the freezer for a second helping. 13. Put olive oil in a spray container instead of using it directly from the bottle. Each tablespoon of olive oil is 120 calories and 15g of fat. Use a mister instead of pouring it straight into the pan or onto a salad. This allows for portion control and will save you more than 100 calories. 14. When baking, substitute canned pumpkin for butter or oil. Canned pumpkin-not pumpkin pie mix-is loaded with Vitamin A, which is important for skin and eye health, as well as immunity. And the comparisons are pretty crazy:  cup of canned pumpkin has about 40 calories, compared to butter or oil, which has more than 800 calories. Yes, 800 calories. Applesauce and mashed banana can also serve as good substitutions for butter or oil, usually in a 1:1 ratio. 15. Top casseroles with high-fiber cereal instead of breadcrumbs. Breadcrumbs are typically made with white bread, while breakfast cereals contain 5-9g of fiber per serving. Not only will you save more than 150 calories per  cup serving, the swap will also keep you more full and you'll get a metabolism boost from the added fiber. 16. Snack on pistachios instead of macadamia nuts. Believe it or not, you get the same amount of calories from 35  pistachios (100  calories) as you would from only five macadamia nuts. 17. Chow down on kale chips rather than potato chips. This is my favorite 'don't knock it 'till you try it' swap. Kale chips are so easy to make at home, and you can spice them up with a little grated parmesan or chili powder. Plus, they're a mere fraction of the calories of potato chips, but with the same crunch factor we crave so often. 18. Add seltzer and some fruit slices to your cocktail instead of soda or fruit juice. One cup of soda or fruit juice can pack on as much as 140 calories. Instead, use seltzer and fruit slices. The fruit provides valuable phytochemicals, such as flavonoids and anthocyanins, which help to combat cancer and stave off the aging process.  Potassium Content of Foods  The body needs potassium to control blood pressure and to keep the muscles and nervous system healthy. Here are some healthy foods below that are high in potassium. Also you can get the white label salt of "NO SALT" salt substitute, 1/4 teaspoon of this is equivalent to 71meq potassium.   FOODS AND DRINKS HIGH IN POTASSIUM FOODS MODERATE IN POTASSIUM   Fruits  Avocado (cubed),  c / 50 g.  Banana (sliced), 75 g.  Cantaloupe (cubed), 80 g.  Honeydew, 1 wedge / 85 g.  Kiwi (sliced), 90 g.  Nectarine, 1 small / 129 g.  Orange, 1 medium / 131 g. Vegetables  Artichoke,  of a medium / 64 g.  Asparagus (boiled), 90 g..  Broccoli (boiled), 78 g.  Brussels sprout (boiled), 78 g.  Butternut squash (baked), 103 g.  Chickpea (cooked), 82 g.  Green peas (cooked), 80 g.  Kidney beans (cooked), 5 tbsp / 55 g.  Lima beans (cooked),  c / 43 g.  Navy beans (cooked),  c / 61 g.  Spinach (cooked),  c / 45 g.  Sweet potato (baked),  c / 50 g.  Tomato (chopped or sliced), 90 g.  Vegetable juice.  White mushrooms (cooked), 78 g.  Yam (cooked or baked),  c / 34 g.  Zucchini squash (boiled), 90 g. Other Foods and Drinks  Almonds  (whole),  c / 36 g.  Fish, 3 oz / 85 g.  Nonfat fruit variety yogurt, 123 g.  Pistachio nuts, 1 oz / 28 g.  Pumpkin seeds, 1 oz / 28 g.  Red meat (broiled, cooked, grilled), 3 oz / 85 g.  Scallops (steamed), 3 oz / 85 g.  Spaghetti sauce,  c / 66 g.  Sunflower seeds (dry roasted), 1 oz / 28 g.  Veggie burger, 1 patty / 70 g. Fruits  Grapefruit,  of the fruit / 096 g  Plums (sliced), 83 g.  Tangerine, 1 large / 120 g. Vegetables  Carrots (boiled), 78 g.  Carrots (sliced), 61 g.  Rhubarb (cooked with sugar), 120 g.  Rutabaga (cooked), 120 g.  Yellow snap beans (cooked), 63 g. Other Foods and Drinks   Chicken breast (roasted and chopped),  c / 70 g.  Pita bread, 1 large / 64 g.  Shrimp (steamed), 4 oz / 113 g.  Swiss cheese (diced), 70 g.        Bad carbs also include fruit juice, alcohol, and sweet tea. These are empty calories that do not signal to your brain that you are full.   Please remember the good carbs are still carbs which convert into sugar. So please measure them out no  more than 1/2-1 cup of rice, oatmeal, pasta, and beans  Veggies are however free foods! Pile them on.   Not all fruit is created equal. Please see the list below, the fruit at the bottom is higher in sugars than the fruit at the top. Please avoid all dried fruits.

## 2014-12-12 NOTE — Progress Notes (Signed)
Assessment and Plan:  1. Hypertension -Continue medication, monitor blood pressure at home. Continue DASH diet.  Reminder to go to the ER if any CP, SOB, nausea, dizziness, severe HA, changes vision/speech, left arm numbness and tingling and jaw pain.  2. Cholesterol -Continue diet and exercise. Check cholesterol.   3. Prediabetes  -Continue diet and exercise. Check A1C  4. Vitamin D Def - check level and continue medications.   5. Hypothyroidism -check TSH level, continue medications the same, reminded to take on an empty stomach 30-61mins before food.   6. Obesity with co morbidities - long discussion about weight loss, diet, and exercise  7. Hypogonadism - continue replacement therapy, check testosterone levels as needed.    Continue diet and meds as discussed. Further disposition pending results of labs. Over 30 minutes of exam, counseling, chart review, and critical decision making was performed  HPI 64 y.o. male  presents for 3 month follow up on hypertension, cholesterol, prediabetes, and vitamin D deficiency.   His blood pressure has been controlled at home, today their BP is BP: 126/82 mmHg  He does not workout. He denies chest pain, shortness of breath, dizziness.  He is on cholesterol medication, last visit his lipitor was stopped due to elevated CPK, he is uncertain if he is on it or not, he is on fenofibrate and zetia and denies myalgias. His cholesterol is not at goal. The cholesterol last visit was:   Lab Results  Component Value Date   CHOL 204* 10/14/2014   HDL 34* 10/14/2014   LDLCALC NOT CALC 10/14/2014   TRIG 831* 10/14/2014   CHOLHDL 6.0 10/14/2014    He has been working on diet and exercise for prediabetes, and denies paresthesia of the feet, polydipsia, polyuria and visual disturbances. Last A1C in the office was:  Lab Results  Component Value Date   HGBA1C 6.3* 09/12/2014   Patient is on Vitamin D supplement.   Lab Results  Component Value Date   VD25OH 72 09/12/2014     He is on zoloft for depression.  He is on thyroid medication. His medication was not changed last visit.   Lab Results  Component Value Date   TSH 0.914 09/12/2014  .  He has a history of testosterone deficiency and is on testosterone replacement, he had a shot yesterday. He states that the testosterone helps with his energy, libido, muscle mass. Lab Results  Component Value Date   TESTOSTERONE 246* 09/12/2014   BMI is Body mass index is 35.75 kg/(m^2)., he is working on diet and exercise. Wt Readings from Last 3 Encounters:  12/12/14 231 lb 12.8 oz (105.144 kg)  10/14/14 237 lb (107.502 kg)  09/12/14 230 lb (104.327 kg)     Current Medications:  Current Outpatient Prescriptions on File Prior to Visit  Medication Sig Dispense Refill  . Ascorbic Acid (VITAMIN C) 1000 MG tablet Take 1,000 mg by mouth daily.     Marland Kitchen aspirin 325 MG tablet Take 325 mg by mouth daily.      . B Complex Vitamins (B COMPLEX PO) Take by mouth. Takes M,W,F    . Cholecalciferol (VITAMIN D PO) Take 6,000 Int'l Units by mouth daily.    Marland Kitchen EPINEPHrine (EPIPEN 2-PAK) 0.3 mg/0.3 mL IJ SOAJ injection Inject 0.3 mLs (0.3 mg total) into the muscle once. 2 Device 0  . ezetimibe (ZETIA) 10 MG tablet Take 1 tablet (10 mg total) by mouth daily. 90 tablet 4  . fenofibrate (TRICOR) 145 MG tablet Take  1 tablet (145 mg total) by mouth daily. 90 tablet 3  . fish oil-omega-3 fatty acids 1000 MG capsule Take 2 g by mouth 3 (three) times daily.     . Flaxseed, Linseed, (FLAX SEED OIL) 1000 MG CAPS Take 2,000 mg by mouth daily.    . hydrochlorothiazide (HYDRODIURIL) 25 MG tablet Take 1 tablet (25 mg total) by mouth every morning. 90 tablet PRN  . levothyroxine (SYNTHROID, LEVOTHROID) 50 MCG tablet TAKE ONE TABLET BY MOUTH ONCE DAILY 90 tablet 1  . MAGNESIUM PO Take 650 mg by mouth daily.     . metoprolol tartrate (LOPRESSOR) 25 MG tablet Take 1 tablet (25 mg total) by mouth 2 (two) times daily. 180 tablet  PRN  . Multiple Vitamin (MULTIVITAMIN) capsule Take 1 capsule by mouth daily.      . sertraline (ZOLOFT) 100 MG tablet Take 1 & 1/2 to 2 tablets daily as directed for mood 180 tablet 1  . tadalafil (CIALIS) 5 MG tablet Take 1 tablet (5 mg total) by mouth as needed for erectile dysfunction. 90 tablet 1  . valsartan (DIOVAN) 320 MG tablet Take 1/2 to 1 tablet daily 90 tablet 3   No current facility-administered medications on file prior to visit.   Medical History:  Past Medical History  Diagnosis Date  . Cardiomegaly   . Mixed hyperlipidemia   . Hypertension   . Hypogonadism male   . Sleep apnea   . Obesity   . Colon polyp   . Gallstones   . Pancreas (digestive gland) works poorly   . Prediabetes   . Vitamin D deficiency   . Fatty liver disease, nonalcoholic    Allergies:  Allergies  Allergen Reactions  . Ace Inhibitors     cough  . Bee Venom      Review of Systems:  Review of Systems  Constitutional: Negative.   HENT: Negative.   Eyes: Negative.   Respiratory: Negative.   Cardiovascular: Negative.   Gastrointestinal: Negative.   Genitourinary: Negative.   Musculoskeletal: Negative.   Skin: Negative.   Neurological: Negative.   Endo/Heme/Allergies: Negative.   Psychiatric/Behavioral: Negative.     Family history- Review and unchanged Social history- Review and unchanged Physical Exam: BP 126/82 mmHg  Pulse 64  Temp(Src) 97.7 F (36.5 C)  Resp 16  Ht 5' 7.5" (1.715 m)  Wt 231 lb 12.8 oz (105.144 kg)  BMI 35.75 kg/m2 Wt Readings from Last 3 Encounters:  12/12/14 231 lb 12.8 oz (105.144 kg)  10/14/14 237 lb (107.502 kg)  09/12/14 230 lb (104.327 kg)   General Appearance: Well nourished, in no apparent distress, obese. Eyes: PERRLA, EOMs, conjunctiva no swelling or erythema Sinuses: No Frontal/maxillary tenderness ENT/Mouth: Ext aud canals clear, TMs without erythema, bulging. No erythema, swelling, or exudate on post pharynx.  Tonsils not swollen or  erythematous. Hearing normal.  Neck: Supple, thyroid normal.  Respiratory: Respiratory effort normal, BS equal bilaterally without rales, rhonchi, wheezing or stridor.  Cardio: RRR with no MRGs. Brisk peripheral pulses without edema.  Abdomen: Soft, + BS, obese, with large well healing vertical and right horizontal scar,  Non tender, no guarding, rebound, hernias, masses. Lymphatics: Non tender without lymphadenopathy.  Musculoskeletal: Full ROM, 5/5 strength, Normal gait Skin: Warm, dry without rashes, lesions, ecchymosis.  Neuro: Cranial nerves intact. Normal muscle tone, no cerebellar symptoms. Psych: Awake and oriented X 3, normal affect, Insight and Judgment appropriate.    Vicie Mutters, PA-C 8:50 AM [facility]

## 2014-12-13 LAB — HEMOGLOBIN A1C
Hgb A1c MFr Bld: 5.9 % — ABNORMAL HIGH (ref <5.7)
Mean Plasma Glucose: 123 mg/dL — ABNORMAL HIGH (ref <117)

## 2014-12-13 LAB — LIPID PANEL
CHOL/HDL RATIO: 4.8 ratio
Cholesterol: 195 mg/dL (ref 0–200)
HDL: 41 mg/dL (ref >=40)
LDL Cholesterol: 108 mg/dL — ABNORMAL HIGH (ref 0–99)
TRIGLYCERIDES: 231 mg/dL — AB (ref <150)
VLDL: 46 mg/dL — AB (ref 0–40)

## 2014-12-13 LAB — BASIC METABOLIC PANEL WITH GFR
BUN: 24 mg/dL — ABNORMAL HIGH (ref 6–23)
CALCIUM: 9.8 mg/dL (ref 8.4–10.5)
CHLORIDE: 104 meq/L (ref 96–112)
CO2: 27 meq/L (ref 19–32)
Creat: 1.07 mg/dL (ref 0.50–1.35)
GFR, Est African American: 85 mL/min
GFR, Est Non African American: 73 mL/min
Glucose, Bld: 96 mg/dL (ref 70–99)
Potassium: 4.1 mEq/L (ref 3.5–5.3)
SODIUM: 142 meq/L (ref 135–145)

## 2014-12-13 LAB — TSH: TSH: 1.383 u[IU]/mL (ref 0.350–4.500)

## 2014-12-13 LAB — HEPATIC FUNCTION PANEL
ALT: 45 U/L (ref 0–53)
AST: 37 U/L (ref 0–37)
Albumin: 4.5 g/dL (ref 3.5–5.2)
Alkaline Phosphatase: 45 U/L (ref 39–117)
BILIRUBIN INDIRECT: 0.3 mg/dL (ref 0.2–1.2)
Bilirubin, Direct: 0.1 mg/dL (ref 0.0–0.3)
Total Bilirubin: 0.4 mg/dL (ref 0.2–1.2)
Total Protein: 7.1 g/dL (ref 6.0–8.3)

## 2014-12-13 LAB — MAGNESIUM: MAGNESIUM: 2.1 mg/dL (ref 1.5–2.5)

## 2014-12-13 LAB — VITAMIN D 25 HYDROXY (VIT D DEFICIENCY, FRACTURES): Vit D, 25-Hydroxy: 62 ng/mL (ref 30–100)

## 2014-12-18 ENCOUNTER — Other Ambulatory Visit: Payer: Self-pay | Admitting: Internal Medicine

## 2015-03-01 ENCOUNTER — Other Ambulatory Visit: Payer: Self-pay | Admitting: Physician Assistant

## 2015-03-01 ENCOUNTER — Other Ambulatory Visit: Payer: Self-pay | Admitting: *Deleted

## 2015-03-01 MED ORDER — TESTOSTERONE CYPIONATE 200 MG/ML IM SOLN: 400.0000 mg | INTRAMUSCULAR | Status: DC

## 2015-03-01 MED ORDER — "SYRINGE 21G X 1"" 3 ML MISC": Status: DC

## 2015-03-17 ENCOUNTER — Encounter: Payer: Self-pay | Admitting: Physician Assistant

## 2015-03-17 ENCOUNTER — Ambulatory Visit (INDEPENDENT_AMBULATORY_CARE_PROVIDER_SITE_OTHER): Payer: BLUE CROSS/BLUE SHIELD | Admitting: Physician Assistant

## 2015-03-17 VITALS — BP 130/80 | HR 60 | Temp 97.7°F | Resp 14 | Ht 67.5 in | Wt 232.0 lb

## 2015-03-17 DIAGNOSIS — R7303 Prediabetes: Secondary | ICD-10-CM

## 2015-03-17 DIAGNOSIS — E039 Hypothyroidism, unspecified: Secondary | ICD-10-CM

## 2015-03-17 DIAGNOSIS — E782 Mixed hyperlipidemia: Secondary | ICD-10-CM | POA: Diagnosis not present

## 2015-03-17 DIAGNOSIS — F325 Major depressive disorder, single episode, in full remission: Secondary | ICD-10-CM | POA: Diagnosis not present

## 2015-03-17 DIAGNOSIS — E291 Testicular hypofunction: Secondary | ICD-10-CM | POA: Diagnosis not present

## 2015-03-17 DIAGNOSIS — E559 Vitamin D deficiency, unspecified: Secondary | ICD-10-CM | POA: Diagnosis not present

## 2015-03-17 DIAGNOSIS — Z79899 Other long term (current) drug therapy: Secondary | ICD-10-CM

## 2015-03-17 DIAGNOSIS — I10 Essential (primary) hypertension: Secondary | ICD-10-CM | POA: Diagnosis not present

## 2015-03-17 DIAGNOSIS — E349 Endocrine disorder, unspecified: Secondary | ICD-10-CM

## 2015-03-17 LAB — CBC WITH DIFFERENTIAL/PLATELET
Basophils Absolute: 0 10*3/uL (ref 0.0–0.1)
Basophils Relative: 0 % (ref 0–1)
EOS ABS: 0.2 10*3/uL (ref 0.0–0.7)
EOS PCT: 5 % (ref 0–5)
HCT: 48.8 % (ref 39.0–52.0)
Hemoglobin: 16.4 g/dL (ref 13.0–17.0)
LYMPHS ABS: 0.9 10*3/uL (ref 0.7–4.0)
Lymphocytes Relative: 21 % (ref 12–46)
MCH: 30 pg (ref 26.0–34.0)
MCHC: 33.6 g/dL (ref 30.0–36.0)
MCV: 89.4 fL (ref 78.0–100.0)
MPV: 9.8 fL (ref 8.6–12.4)
Monocytes Absolute: 0.6 10*3/uL (ref 0.1–1.0)
Monocytes Relative: 14 % — ABNORMAL HIGH (ref 3–12)
NEUTROS PCT: 60 % (ref 43–77)
Neutro Abs: 2.7 10*3/uL (ref 1.7–7.7)
Platelets: 243 10*3/uL (ref 150–400)
RBC: 5.46 MIL/uL (ref 4.22–5.81)
RDW: 14.2 % (ref 11.5–15.5)
WBC: 4.5 10*3/uL (ref 4.0–10.5)

## 2015-03-17 LAB — HEMOGLOBIN A1C
HEMOGLOBIN A1C: 5.8 % — AB (ref <5.7)
Mean Plasma Glucose: 120 mg/dL — ABNORMAL HIGH (ref <117)

## 2015-03-17 NOTE — Patient Instructions (Signed)
We want weight loss that will last so you should lose 1-2 pounds a week.  THAT IS IT! Please pick THREE things a month to change. Once it is a habit check off the item. Then pick another three items off the list to become habits.  If you are already doing a habit on the list GREAT!  Cross that item off! o Don't drink your calories. Ie, alcohol, soda, fruit juice, and sweet tea.  o Drink more water. Drink a glass when you feel hungry or before each meal.  o Eat breakfast - Complex carb and protein (likeDannon light and fit yogurt, oatmeal, fruit, eggs, turkey bacon). o Measure your cereal.  Eat no more than one cup a day. (ie Kashi) o Eat an apple a day. o Add a vegetable a day. o Try a new vegetable a month. o Use Pam! Stop using oil or butter to cook. o Don't finish your plate or use smaller plates. o Share your dessert. o Eat sugar free Jello for dessert or frozen grapes. o Don't eat 2-3 hours before bed. o Switch to whole wheat bread, pasta, and brown rice. o Make healthier choices when you eat out. No fries! o Pick baked chicken, NOT fried. o Don't forget to SLOW DOWN when you eat. It is not going anywhere.  o Take the stairs. o Park far away in the parking lot o Lift soup cans (or weights) for 10 minutes while watching TV. o Walk at work for 10 minutes during break. o Walk outside 1 time a week with your friend, kids, dog, or significant other. o Start a walking group at church. o Walk the mall as much as you can tolerate.  o Keep a food diary. o Weigh yourself daily. o Walk for 15 minutes 3 days per week. o Cook at home more often and eat out less.  If life happens and you go back to old habits, it is okay.  Just start over. You can do it!   If you experience chest pain, get short of breath, or tired during the exercise, please stop immediately and inform your doctor.   Before you even begin to attack a weight-loss plan, it pays to remember this: You are not fat. You have fat.  Losing weight isn't about blame or shame; it's simply another achievement to accomplish. Dieting is like any other skill-you have to buckle down and work at it. As long as you act in a smart, reasonable way, you'll ultimately get where you want to be. Here are some weight loss pearls for you.  1. It's Not a Diet. It's a Lifestyle Thinking of a diet as something you're on and suffering through only for the short term doesn't work. To shed weight and keep it off, you need to make permanent changes to the way you eat. It's OK to indulge occasionally, of course, but if you cut calories temporarily and then revert to your old way of eating, you'll gain back the weight quicker than you can say yo-yo. Use it to lose it. Research shows that one of the best predictors of long-term weight loss is how many pounds you drop in the first month. For that reason, nutritionists often suggest being stricter for the first two weeks of your new eating strategy to build momentum. Cut out added sugar and alcohol and avoid unrefined carbs. After that, figure out how you can reincorporate them in a way that's healthy and maintainable.  2. There's a Right   Way to Exercise Working out burns calories and fat and boosts your metabolism by building muscle. But those trying to lose weight are notorious for overestimating the number of calories they burn and underestimating the amount they take in. Unfortunately, your system is biologically programmed to hold on to extra pounds and that means when you start exercising, your body senses the deficit and ramps up its hunger signals. If you're not diligent, you'll eat everything you burn and then some. Use it to lose it. Cardio gets all the exercise glory, but strength and interval training are the real heroes. They help you build lean muscle, which in turn increases your metabolism and calorie-burning ability 3. Don't Overreact to Mild Hunger Some people have a hard time losing weight because  of hunger anxiety. To them, being hungry is bad-something to be avoided at all costs-so they carry snacks with them and eat when they don't need to. Others eat because they're stressed out or bored. While you never want to get to the point of being ravenous (that's when bingeing is likely to happen), a hunger pang, a craving, or the fact that it's 3:00 p.m. should not send you racing for the vending machine or obsessing about the energy bar in your purse. Ideally, you should put off eating until your stomach is growling and it's difficult to concentrate.  Use it to lose it. When you feel the urge to eat, use the HALT method. Ask yourself, Am I really hungry? Or am I angry or anxious, lonely or bored, or tired? If you're still not certain, try the apple test. If you're truly hungry, an apple should seem delicious; if it doesn't, something else is going on. Or you can try drinking water and making yourself busy, if you are still hungry try a healthy snack.  4. Not All Calories Are Created Equal The mechanics of weight loss are pretty simple: Take in fewer calories than you use for energy. But the kind of food you eat makes all the difference. Processed food that's high in saturated fat and refined starch or sugar can cause inflammation that disrupts the hormone signals that tell your brain you're full. The result: You eat a lot more.  Use it to lose it. Clean up your diet. Swap in whole, unprocessed foods, including vegetables, lean protein, and healthy fats that will fill you up and give you the biggest nutritional bang for your calorie buck. In a few weeks, as your brain starts receiving regular hunger and fullness signals once again, you'll notice that you feel less hungry overall and naturally start cutting back on the amount you eat.  5. Protein, Produce, and Plant-Based Fats Are Your Weight-Loss Trinity Here's why eating the three Ps regularly will help you drop pounds. Protein fills you up. You need it  to build lean muscle, which keeps your metabolism humming so that you can torch more fat. People in a weight-loss program who ate double the recommended daily allowance for protein (about 110 grams for a 150-pound woman) lost 70 percent of their weight from fat, while people who ate the RDA lost only about 40 percent, one study found. Produce is packed with filling fiber. "It's very difficult to consume too many calories if you're eating a lot of vegetables. Example: Three cups of broccoli is a lot of food, yet only 93 calories. (Fruit is another story. It can be easy to overeat and can contain a lot of calories from sugar, so be sure to   monitor your intake.) Plant-based fats like olive oil and those in avocados and nuts are healthy and extra satiating.  Use it to lose it. Aim to incorporate each of the three Ps into every meal and snack. People who eat protein throughout the day are able to keep weight off, according to a study in the American Journal of Clinical Nutrition. In addition to meat, poultry and seafood, good sources are beans, lentils, eggs, tofu, and yogurt. As for fat, keep portion sizes in check by measuring out salad dressing, oil, and nut butters (shoot for one to two tablespoons). Finally, eat veggies or a little fruit at every meal. People who did that consumed 308 fewer calories but didn't feel any hungrier than when they didn't eat more produce.  7. How You Eat Is As Important As What You Eat In order for your brain to register that you're full, you need to focus on what you're eating. Sit down whenever you eat, preferably at a table. Turn off the TV or computer, put down your phone, and look at your food. Smell it. Chew slowly, and don't put another bite on your fork until you swallow. When women ate lunch this attentively, they consumed 30 percent less when snacking later than those who listened to an audiobook at lunchtime, according to a study in the [employer] Journal of Nutrition. 8.  Weighing Yourself Really Works The scale provides the best evidence about whether your efforts are paying off. Seeing the numbers tick up or down or stagnate is motivation to keep going-or to rethink your approach. A 2015 study at [employer] found that daily weigh-ins helped people lose more weight, keep it off, and maintain that loss, even after two years. Use it to lose it. Step on the scale at the same time every day for the best results. If your weight shoots up several pounds from one weigh-in to the next, don't freak out. Eating a lot of salt the night before or having your period is the likely culprit. The number should return to normal in a day or two. It's a steady climb that you need to do something about. 9. Too Much Stress and Too Little Sleep Are Your Enemies When you're tired and frazzled, your body cranks up the production of cortisol, the stress hormone that can cause carb cravings. Not getting enough sleep also boosts your levels of ghrelin, a hormone associated with hunger, while suppressing leptin, a hormone that signals fullness and satiety. People on a diet who slept only five and a half hours a night for two weeks lost 55 percent less fat and were hungrier than those who slept eight and a half hours, according to a study in the [employer] Journal. Use it to lose it. Prioritize sleep, aiming for seven hours or more a night, which research shows helps lower stress. And make sure you're getting quality zzz's. If a snoring spouse or a fidgety cat wakes you up frequently throughout the night, you may end up getting the equivalent of just four hours of sleep, according to a study from [employer]. Keep pets out of the bedroom, and use a white-noise app to drown out snoring. 10. You Will Hit a plateau-And You Can Bust Through It As you slim down, your body releases much less leptin, the fullness hormone.  If you're not strength training, start right now.  Building muscle can raise your metabolism to help you overcome a plateau. To keep your body challenged   and burning calories, incorporate new moves and more intense intervals into your workouts or add another sweat session to your weekly routine. Alternatively, cut an extra 100 calories or so a day from your diet. Now that you've lost weight, your body simply doesn't need as much fuel.   Ways to cut 100 calories  1. Eat your eggs with hot sauce OR salsa instead of cheese.  Eggs are great for breakfast, but many people consider eggs and cheese to be BFFs. Instead of cheese-1 oz. of cheddar has 114 calories-top your eggs with hot sauce, which contains no calories and helps with satiety and metabolism. Salsa is also a great option!!  2. Top your toast, waffles or pancakes with mashed berries instead of jelly or syrup. Half a cup of berries-fresh, frozen or thawed-has about 40 calories, compared with 2 tbsp. of maple syrup or jelly, which both have about 100 calories. The berries will also give you a good punch of fiber, which helps keep you full and satisfied and won't spike blood sugar quickly like the jelly or syrup. 3. Swap the non-fat latte for black coffee with a splash of half-and-half. Contrary to its name, that non-fat latte has 130 calories and a startling 19g of carbohydrates per 16 oz. serving. Replacing that 'light' drinkable dessert with a black coffee with a splash of half-and-half saves you more than 100 calories per 16 oz. serving. 4. Sprinkle salads with freeze-dried raspberries instead of dried cranberries. If you want a sweet addition to your nutritious salad, stay away from dried cranberries. They have a whopping 130 calories per  cup and 30g carbohydrates. Instead, sprinkle freeze-dried raspberries guilt-free and save more than 100 calories per  cup serving, adding 3g of belly-filling fiber. 5. Go for mustard in place of mayo on your sandwich. Mustard can add really nice flavor to any  sandwich, and there are tons of varieties, from spicy to honey. A serving of mayo is 95 calories, versus 10 calories in a serving of mustard. 6. Choose a DIY salad dressing instead of the store-bought kind. Mix Dijon or whole grain mustard with low-fat Kefir or red wine vinegar and garlic. 7. Use hummus as a spread instead of a dip. Use hummus as a spread on a high-fiber cracker or tortilla with a sandwich and save on calories without sacrificing taste. 8. Pick just one salad "accessory." Salad isn't automatically a calorie winner. It's easy to over-accessorize with toppings. Instead of topping your salad with nuts, avocado and cranberries (all three will clock in at 313 calories), just pick one. The next day, choose a different accessory, which will also keep your salad interesting. You don't wear all your jewelry every day, right? 9. Ditch the white pasta in favor of spaghetti squash. One cup of cooked spaghetti squash has about 40 calories, compared with traditional spaghetti, which comes with more than 200. Spaghetti squash is also nutrient-dense. It's a good source of fiber and Vitamins A and C, and it can be eaten just like you would eat pasta-with a great tomato sauce and turkey meatballs or with pesto, tofu and spinach, for example. 10. Dress up your chili, soups and stews with non-fat Greek yogurt instead of sour cream. Just a 'dollop' of sour cream can set you back 115 calories and a whopping 12g of fat-seven of which are of the artery-clogging variety. Added bonus: Greek yogurt is packed with muscle-building protein, calcium and B Vitamins. 11. Mash cauliflower instead of mashed potatoes. One cup of   traditional mashed potatoes-in all their creamy goodness-has more than 200 calories, compared to mashed cauliflower, which you can typically eat for less than 100 calories per 1 cup serving. Cauliflower is a great source of the antioxidant indole-3-carbinol (I3C), which may help reduce the risk of  some cancers, like breast cancer. 12. Ditch the ice cream sundae in favor of a Greek yogurt parfait. Instead of a cup of ice cream or fro-yo for dessert, try 1 cup of nonfat Greek yogurt topped with fresh berries and a sprinkle of cacao nibs. Both toppings are packed with antioxidants, which can help reduce cellular inflammation and oxidative damage. And the comparison is a no-brainer: One cup of ice cream has about 275 calories; one cup of frozen yogurt has about 230; and a cup of Greek yogurt has just 130, plus twice the protein, so you're less likely to return to the freezer for a second helping. 13. Put olive oil in a spray container instead of using it directly from the bottle. Each tablespoon of olive oil is 120 calories and 15g of fat. Use a mister instead of pouring it straight into the pan or onto a salad. This allows for portion control and will save you more than 100 calories. 14. When baking, substitute canned pumpkin for butter or oil. Canned pumpkin-not pumpkin pie mix-is loaded with Vitamin A, which is important for skin and eye health, as well as immunity. And the comparisons are pretty crazy:  cup of canned pumpkin has about 40 calories, compared to butter or oil, which has more than 800 calories. Yes, 800 calories. Applesauce and mashed banana can also serve as good substitutions for butter or oil, usually in a 1:1 ratio. 15. Top casseroles with high-fiber cereal instead of breadcrumbs. Breadcrumbs are typically made with white bread, while breakfast cereals contain 5-9g of fiber per serving. Not only will you save more than 150 calories per  cup serving, the swap will also keep you more full and you'll get a metabolism boost from the added fiber. 16. Snack on pistachios instead of macadamia nuts. Believe it or not, you get the same amount of calories from 35 pistachios (100 calories) as you would from only five macadamia nuts. 17. Chow down on kale chips rather than potato  chips. This is my favorite 'don't knock it 'till you try it' swap. Kale chips are so easy to make at home, and you can spice them up with a little grated parmesan or chili powder. Plus, they're a mere fraction of the calories of potato chips, but with the same crunch factor we crave so often. 18. Add seltzer and some fruit slices to your cocktail instead of soda or fruit juice. One cup of soda or fruit juice can pack on as much as 140 calories. Instead, use seltzer and fruit slices. The fruit provides valuable phytochemicals, such as flavonoids and anthocyanins, which help to combat cancer and stave off the aging process.  

## 2015-03-17 NOTE — Progress Notes (Signed)
Assessment and Plan:  1. Hypertension -Continue medication, monitor blood pressure at home. Continue DASH diet.  Reminder to go to the ER if any CP, SOB, nausea, dizziness, severe HA, changes vision/speech, left arm numbness and tingling and jaw pain.  2. Cholesterol -Continue diet and exercise. Check cholesterol.   3. Prediabetes  -Continue diet and exercise. Check A1C  4. Vitamin D Def - check level and continue medications.   5. Hypothyroidism -check TSH level, continue medications the same, reminded to take on an empty stomach 30-14mins before food.   6. Obesity with co morbidities - long discussion about weight loss, diet, and exercise  7. Hypogonadism - continue replacement therapy, check testosterone levels as needed.    Continue diet and meds as discussed. Further disposition pending results of labs. Over 30 minutes of exam, counseling, chart review, and critical decision making was performed  HPI 64 y.o. male  presents for 3 month follow up on hypertension, cholesterol, prediabetes, and vitamin D deficiency.   His blood pressure has been controlled at home, today their BP is BP: 130/80 mmHg  He does not workout. He denies chest pain, shortness of breath, dizziness.  He is on cholesterol medication, last visit his lipitor was stopped due to elevated CPK, he is uncertain if he is on it or not, he is on fenofibrate and zetia and denies myalgias. His cholesterol is not at goal. The cholesterol last visit was:   Lab Results  Component Value Date   CHOL 195 12/12/2014   HDL 41 12/12/2014   LDLCALC 108* 12/12/2014   TRIG 231* 12/12/2014   CHOLHDL 4.8 12/12/2014    He has been working on diet and exercise for prediabetes, and denies paresthesia of the feet, polydipsia, polyuria and visual disturbances. Last A1C in the office was:  Lab Results  Component Value Date   HGBA1C 5.9* 12/12/2014   Patient is on Vitamin D supplement.   Lab Results  Component Value Date   VD25OH 26 12/12/2014     He is on zoloft for depression.  He is on thyroid medication. His medication was not changed last visit.   Lab Results  Component Value Date   TSH 1.383 12/12/2014  .  He has a history of testosterone deficiency and is on testosterone replacement, last shot was 2 weeks ago, due this weekend. He states that the testosterone helps with his energy, libido, muscle mass. Lab Results  Component Value Date   TESTOSTERONE 246* 09/12/2014   BMI is Body mass index is 35.78 kg/(m^2)., he is working on diet and exercise. Wt Readings from Last 3 Encounters:  03/17/15 232 lb (105.235 kg)  12/12/14 231 lb 12.8 oz (105.144 kg)  10/14/14 237 lb (107.502 kg)     Current Medications:  Current Outpatient Prescriptions on File Prior to Visit  Medication Sig Dispense Refill  . Ascorbic Acid (VITAMIN C) 1000 MG tablet Take 1,000 mg by mouth daily.     Marland Kitchen aspirin 325 MG tablet Take 325 mg by mouth daily.      . B Complex Vitamins (B COMPLEX PO) Take by mouth. Takes M,W,F    . Cholecalciferol (VITAMIN D PO) Take 6,000 Int'l Units by mouth daily.    Marland Kitchen EPINEPHrine (EPIPEN 2-PAK) 0.3 mg/0.3 mL IJ SOAJ injection Inject 0.3 mLs (0.3 mg total) into the muscle once. 2 Device 0  . fenofibrate (TRICOR) 145 MG tablet Take 1 tablet (145 mg total) by mouth daily. 90 tablet 3  . fish oil-omega-3 fatty  acids 1000 MG capsule Take 2 g by mouth 3 (three) times daily.     . Flaxseed, Linseed, (FLAX SEED OIL) 1000 MG CAPS Take 2,000 mg by mouth daily.    . hydrochlorothiazide (HYDRODIURIL) 25 MG tablet Take 1 tablet (25 mg total) by mouth every morning. 90 tablet PRN  . levothyroxine (SYNTHROID, LEVOTHROID) 50 MCG tablet TAKE ONE TABLET BY MOUTH ONCE DAILY 90 tablet 1  . MAGNESIUM PO Take 650 mg by mouth daily.     . metoprolol tartrate (LOPRESSOR) 25 MG tablet Take 1 tablet (25 mg total) by mouth 2 (two) times daily. 180 tablet PRN  . Multiple Vitamin (MULTIVITAMIN) capsule Take 1 capsule by mouth  daily.      . sertraline (ZOLOFT) 100 MG tablet Take 1 & 1/2 to 2 tablets daily as directed for mood 180 tablet 1  . Syringe/Needle, Disp, (SYRINGE 3CC/21GX1") 21G X 1" 3 ML MISC Use AD with testosterone Rx 12 each 1  . testosterone cypionate (DEPOTESTOSTERONE CYPIONATE) 200 MG/ML injection Inject 2 mLs (400 mg total) into the muscle every 14 (fourteen) days. 10 mL 1  . valsartan (DIOVAN) 320 MG tablet Take 1/2 to 1 tablet daily 90 tablet 3  . ezetimibe (ZETIA) 10 MG tablet Take 1 tablet (10 mg total) by mouth daily. 90 tablet 4  . tadalafil (CIALIS) 5 MG tablet Take 1 tablet (5 mg total) by mouth as needed for erectile dysfunction. 90 tablet 1   No current facility-administered medications on file prior to visit.   Medical History:  Past Medical History  Diagnosis Date  . Cardiomegaly   . Mixed hyperlipidemia   . Hypertension   . Hypogonadism male   . Sleep apnea   . Obesity   . Colon polyp   . Gallstones   . Pancreas (digestive gland) works poorly ([place])   . Prediabetes   . Vitamin D deficiency   . Fatty liver disease, nonalcoholic    Allergies:  Allergies  Allergen Reactions  . Ace Inhibitors     cough  . Bee Venom      Review of Systems:  Review of Systems  Constitutional: Negative.   HENT: Negative.   Eyes: Negative.   Respiratory: Negative.   Cardiovascular: Negative.   Gastrointestinal: Negative.   Genitourinary: Negative.   Musculoskeletal: Negative.   Skin: Negative.   Neurological: Negative.   Endo/Heme/Allergies: Negative.   Psychiatric/Behavioral: Negative.     Family history- Review and unchanged Social history- Review and unchanged Physical Exam: BP 130/80 mmHg  Pulse 60  Temp(Src) 97.7 F (36.5 C) (Temporal)  Resp 14  Ht 5' 7.5" (1.715 m)  Wt 232 lb (105.235 kg)  BMI 35.78 kg/m2  SpO2 95% Wt Readings from Last 3 Encounters:  03/17/15 232 lb (105.235 kg)  12/12/14 231 lb 12.8 oz (105.144 kg)  10/14/14 237 lb (107.502 kg)   General  Appearance: Well nourished, in no apparent distress, obese. Eyes: PERRLA, EOMs, conjunctiva no swelling or erythema Sinuses: No Frontal/maxillary tenderness ENT/Mouth: Ext aud canals clear, TMs without erythema, bulging. No erythema, swelling, or exudate on post pharynx.  Tonsils not swollen or erythematous. Hearing normal.  Neck: Supple, thyroid normal.  Respiratory: Respiratory effort normal, BS equal bilaterally without rales, rhonchi, wheezing or stridor.  Cardio: RRR with no MRGs. Brisk peripheral pulses without edema.  Abdomen: Soft, + BS, obese, with large well healing vertical and right horizontal scar,  Non tender, no guarding, rebound, hernias, masses. Lymphatics: Non tender without lymphadenopathy.  Musculoskeletal:  Full ROM, 5/5 strength, Normal gait Skin: Warm, dry without rashes, lesions, ecchymosis.  Neuro: Cranial nerves intact. Normal muscle tone, no cerebellar symptoms. Psych: Awake and oriented X 3, normal affect, Insight and Judgment appropriate.    Vicie Mutters, PA-C 8:29 AM [facility]

## 2015-03-18 LAB — HEPATIC FUNCTION PANEL
ALBUMIN: 4.8 g/dL (ref 3.6–5.1)
ALT: 62 U/L — ABNORMAL HIGH (ref 9–46)
AST: 48 U/L — ABNORMAL HIGH (ref 10–35)
Alkaline Phosphatase: 40 U/L (ref 40–115)
Bilirubin, Direct: 0.1 mg/dL (ref <=0.2)
Indirect Bilirubin: 0.4 mg/dL (ref 0.2–1.2)
TOTAL PROTEIN: 7.3 g/dL (ref 6.1–8.1)
Total Bilirubin: 0.5 mg/dL (ref 0.2–1.2)

## 2015-03-18 LAB — VITAMIN D 25 HYDROXY (VIT D DEFICIENCY, FRACTURES): VIT D 25 HYDROXY: 61 ng/mL (ref 30–100)

## 2015-03-18 LAB — TSH: TSH: 1.571 u[IU]/mL (ref 0.350–4.500)

## 2015-03-18 LAB — LIPID PANEL
Cholesterol: 180 mg/dL (ref 125–200)
HDL: 35 mg/dL — ABNORMAL LOW (ref >=40)
LDL Cholesterol: 100 mg/dL (ref <130)
Total CHOL/HDL Ratio: 5.1 Ratio — ABNORMAL HIGH (ref <=5.0)
Triglycerides: 227 mg/dL — ABNORMAL HIGH (ref <150)
VLDL: 45 mg/dL — ABNORMAL HIGH (ref <30)

## 2015-03-18 LAB — MAGNESIUM: MAGNESIUM: 2 mg/dL (ref 1.5–2.5)

## 2015-03-18 LAB — BASIC METABOLIC PANEL WITH GFR
BUN: 16 mg/dL (ref 7–25)
CHLORIDE: 101 mmol/L (ref 98–110)
CO2: 30 mmol/L (ref 20–31)
Calcium: 9.8 mg/dL (ref 8.6–10.3)
Creat: 1.08 mg/dL (ref 0.70–1.25)
GFR, Est African American: 83 mL/min (ref >=60)
GFR, Est Non African American: 72 mL/min (ref >=60)
GLUCOSE: 84 mg/dL (ref 65–99)
Potassium: 4.5 mmol/L (ref 3.5–5.3)
Sodium: 139 mmol/L (ref 135–146)

## 2015-03-18 LAB — TESTOSTERONE: Testosterone: 357 ng/dL (ref 300–890)

## 2015-03-18 LAB — INSULIN, FASTING: Insulin fasting, serum: 13.3 u[IU]/mL (ref 2.0–19.6)

## 2015-03-19 ENCOUNTER — Other Ambulatory Visit: Payer: Self-pay | Admitting: Internal Medicine

## 2015-04-04 ENCOUNTER — Encounter: Payer: Self-pay | Admitting: Physician Assistant

## 2015-04-04 ENCOUNTER — Ambulatory Visit (INDEPENDENT_AMBULATORY_CARE_PROVIDER_SITE_OTHER): Payer: BLUE CROSS/BLUE SHIELD | Admitting: Physician Assistant

## 2015-04-04 VITALS — BP 120/80 | HR 62 | Temp 97.5°F | Resp 14 | Ht 67.5 in | Wt 232.0 lb

## 2015-04-04 DIAGNOSIS — M7581 Other shoulder lesions, right shoulder: Secondary | ICD-10-CM | POA: Diagnosis not present

## 2015-04-04 DIAGNOSIS — S46211A Strain of muscle, fascia and tendon of other parts of biceps, right arm, initial encounter: Secondary | ICD-10-CM

## 2015-04-04 DIAGNOSIS — S46111A Strain of muscle, fascia and tendon of long head of biceps, right arm, initial encounter: Secondary | ICD-10-CM

## 2015-04-04 MED ORDER — PREDNISONE 20 MG PO TABS: ORAL_TABLET | ORAL | Status: DC

## 2015-04-04 NOTE — Patient Instructions (Signed)
Rotator Cuff Injury Rotator cuff injury is any type of injury to the set of muscles and tendons that make up the stabilizing unit of your shoulder. This unit holds the ball of your upper arm bone (humerus) in the socket of your shoulder blade (scapula).  CAUSES Injuries to your rotator cuff most commonly come from sports or activities that cause your arm to be moved repeatedly over your head. Examples of this include throwing, weight lifting, swimming, or racquet sports. Long lasting (chronic) irritation of your rotator cuff can cause soreness and swelling (inflammation), bursitis, and eventual damage to your tendons, such as a tear (rupture). SIGNS AND SYMPTOMS Acute rotator cuff tear:  Sudden tearing sensation followed by severe pain shooting from your upper shoulder down your arm toward your elbow.  Decreased range of motion of your shoulder because of pain and muscle spasm.  Severe pain.  Inability to raise your arm out to the side because of pain and loss of muscle power (large tears). Chronic rotator cuff tear:  Pain that usually is worse at night and may interfere with sleep.  Gradual weakness and decreased shoulder motion as the pain worsens.  Decreased range of motion. Rotator cuff tendinitis:  Deep ache in your shoulder and the outside upper arm over your shoulder.  Pain that comes on gradually and becomes worse when lifting your arm to the side or turning it inward. DIAGNOSIS Rotator cuff injury is diagnosed through a medical history, physical exam, and imaging exam. The medical history helps determine the type of rotator cuff injury. Your health care provider will look at your injured shoulder, feel the injured area, and ask you to move your shoulder in different positions. X-ray exams typically are done to rule out other causes of shoulder pain, such as fractures. MRI is the exam of choice for the most severe shoulder injuries because the images show muscles and tendons.    TREATMENT  Chronic tear:  Medicine for pain, such as acetaminophen or ibuprofen.  Physical therapy and range-of-motion exercises may be helpful in maintaining shoulder function and strength.  Steroid injections into your shoulder joint.  Surgical repair of the rotator cuff if the injury does not heal with noninvasive treatment. Acute tear:  Anti-inflammatory medicines such as ibuprofen and naproxen to help reduce pain and swelling.  A sling to help support your arm and rest your rotator cuff muscles. Long-term use of a sling is not advised. It may cause significant stiffening of the shoulder joint.  Surgery may be considered within a few weeks, especially in younger, active people, to return the shoulder to full function.  Indications for surgical treatment include the following:  Age younger than 60 years.  Rotator cuff tears that are complete.  Physical therapy, rest, and anti-inflammatory medicines have been used for 6-8 weeks, with no improvement.  Employment or sporting activity that requires constant shoulder use. Tendinitis:  Anti-inflammatory medicines such as ibuprofen and naproxen to help reduce pain and swelling.  A sling to help support your arm and rest your rotator cuff muscles. Long-term use of a sling is not advised. It may cause significant stiffening of the shoulder joint.  Severe tendinitis may require:  Steroid injections into your shoulder joint.  Physical therapy.  Surgery. HOME CARE INSTRUCTIONS   Apply ice to your injury:  Put ice in a plastic bag.  Place a towel between your skin and the bag.  Leave the ice on for 20 minutes, 2-3 times a day.  If you   have a shoulder immobilizer (sling and straps), wear it until told otherwise by your health care provider.  You may want to sleep on several pillows or in a recliner at night to lessen swelling and pain.  Only take over-the-counter or prescription medicines for pain, discomfort, or fever as  directed by your health care provider.  Do simple hand squeezing exercises with a soft rubber ball to decrease hand swelling. SEEK MEDICAL CARE IF:   Your shoulder pain increases, or new pain or numbness develops in your arm, hand, or fingers.  Your hand or fingers are colder than your other hand. SEEK IMMEDIATE MEDICAL CARE IF:   Your arm, hand, or fingers are numb or tingling.  Your arm, hand, or fingers are increasingly swollen and painful, or they turn white or blue. MAKE SURE YOU:  Understand these instructions.  Will watch your condition.  Will get help right away if you are not doing well or get worse.   This information is not intended to replace advice given to you by your health care provider. Make sure you discuss any questions you have with your health care provider.   Document Released: 05/10/2000 Document Revised: 05/18/2013 Document Reviewed: 12/23/2012 Elsevier Interactive Patient Education 2016 [employer]  Biceps Tendon Tendinitis (Proximal) and Tenosynovitis With Rehab Tendonitis and tenosynovitis involve inflammation of the tendon and the tendon lining (sheath). The proximal biceps tendon is vulnerable to tendonitis and tenosynovitis, which causes pain and discomfort in the front of the shoulder and upper arm. The tendon lining secretes a fluid that helps lubricate the tendon, allowing for proper function without pain. When the tendon and its lining become inflamed, the tendon can no longer glide smoothly, causing pain. The proximal biceps tendon connects the biceps muscle to two bones of the shoulder. It is important for proper function of the elbow and turning the palm upward (supination) using the wrist. Proximal biceps tendon tendinitis may include a grade 1 or 2 strain of the tendon. Grade 1 strains involve a slight pull of the tendon without signs of tearing and no observed tendon lengthening. There is also no loss of strength. Grade 2 strains involve small  tears in the tendon fibers. The tendon or muscle is stretched and strength is usually decreased.  SYMPTOMS   Pain, tenderness, swelling, warmth, or redness over the front of the shoulder.  Pain that gets worse with shoulder and elbow use, especially against resistance.  Limited motion of the shoulder or elbow.  Crackling sound (crepitation) when the tendon or shoulder is moved or touched. CAUSES  The symptoms of biceps tendonitis are due to inflammation of the tendon. Inflammation may be caused by:  Strain from sudden increase in amount or intensity of activity.  Direct blow or injury to the elbow (uncommon).  Overuse or repetitive elbow bending or wrist rotation, particularly when turning the palm up, or with elbow hyperextension. RISK INCREASES WITH:  Sports that involve contact or overhead arm activity (throwing sports, gymnastics, weightlifting, bodybuilding, rock climbing).  Heavy labor.  Poor strength and flexibility.  Failure to warm up properly before activity. PREVENTION  Warm up and stretch properly before activity.  Allow time for recovery between activities.  Maintain physical fitness:  Strength, flexibility, and endurance.  Cardiovascular fitness.  Learn and use proper exercise technique. PROGNOSIS  With proper treatment, proximal biceps tendon tendonitis and tenosynovitis is usually curable within 6 weeks. Healing is usually quicker if the cause was a direct blow, not overuse.  RELATED COMPLICATIONS  Longer healing time if not properly treated or if not given enough time to heal.  Chronically inflamed tendon that causes persistent pain with activity, that may progress to constant pain and potentially rupture of the tendon.  Recurring symptoms, especially if activity is resumed too soon or with overuse, a direct blow, or use of poor exercise technique. TREATMENT Treatment first involves ice and medicine, to reduce pain and inflammation. It is helpful to  modify activities that cause pain, to reduce the chances of causing the condition to get worse. Strengthening and stretching exercises should be performed to promote proper use of the muscles of the shoulder. These exercises may be performed at home or with a therapist. Other treatments may be given such as ultrasound or heat therapy. A corticosteroid injection may be recommended to help reduce inflammation of the tendon lining. Surgery is usually not necessary. Sometimes, if symptoms last for greater than 6 months, surgery will be advised to detach the tendon and re-insert it into the arm bone. Surgery to correct other shoulder problems that may be contributing to tendinitis may be advised before surgery for the tendinitis itself.  MEDICATION  If pain medicine is needed, nonsteroidal anti-inflammatory medicines (aspirin and ibuprofen), or other minor pain relievers (acetaminophen), are often advised.  Do not take pain medicine for 7 days before surgery.  Prescription pain relievers may be given if your caregiver thinks they are needed. Use only as directed and only as much as you need.  Corticosteroid injections may be given. These injections should only be used on the most severe cases, as one can only receive a limited number of them. HEAT AND COLD   Cold treatment (icing) should be applied for 10 to 15 minutes every 2 to 3 hours for inflammation and pain, and immediately after activity that aggravates your symptoms. Use ice packs or an ice massage.  Heat treatment may be used before performing stretching and strengthening activities prescribed by your caregiver, physical therapist, or athletic trainer. Use a heat pack or a warm water soak. SEEK MEDICAL CARE IF:   Symptoms get worse or do not improve in 2 weeks, despite treatment.  New, unexplained symptoms develop. (Drugs used in treatment may produce side effects.) EXERCISES RANGE OF MOTION (ROM) AND EXERCISES - Biceps Tendon (Proximal) and  Tenosynovitis These exercises may help you when beginning to rehabilitate your injury. Your symptoms may go away with or without further involvement from your physician, physical therapist, or athletic trainer. While completing these exercises, remember:   Restoring tissue flexibility helps normal motion to return to the joints. This allows healthier, less painful movement and activity.  An effective stretch should be held for at least 30 seconds.  A stretch should never be painful. You should only feel a gentle lengthening or release in the stretched tissue. STRETCH - Flexion, Standing  Stand with good posture. With an underhand grip on your right / left hand and an overhand grip on the opposite hand, grasp a broomstick or cane so that your hands are a little more than shoulder width apart.  Keeping your right / left elbow straight and shoulder muscles relaxed, push the stick with your opposite hand to raise your right / left arm in front of your body and then overhead. Raise your arm until you feel a stretch in your right / left shoulder, but before you have increased shoulder pain.  Try to avoid shrugging your right / left shoulder as your arm rises, by keeping your shoulder  blade tucked down and toward your mid-back spine. Hold for __________ seconds.  Slowly return to the starting position. Repeat __________ times. Complete this exercise __________ times per day. STRETCH - Abduction, Supine  Lie on your back. With an underhand grip on your right / left hand and an overhand grip on the opposite hand, grasp a broomstick or cane so that your hands are a little more than shoulder width apart.  Keeping your right / left elbow straight and shoulder muscles relaxed, push the stick with your opposite hand to raise your right / left arm out to the side of your body and then overhead. Raise your arm until you feel a stretch in your right / left shoulder, but before you have increased shoulder  pain.  Try to avoid shrugging your right / left shoulder as your arm rises, by keeping your shoulder blade tucked down and toward your mid-back spine. Hold for __________ seconds.  Slowly return to the starting position. Repeat __________ times. Complete this exercise __________ times per day. ROM - Flexion, Active-Assisted  Lie on your back. You may bend your knees for comfort.  Grasp a broomstick or cane so your hands are about shoulder width apart. Your right / left hand should grip the end of the stick so that your hand is positioned "thumbs-up," as if you were about to shake hands.  Using your healthy arm to lead, raise your right / left arm overhead until you feel a gentle stretch in your shoulder. Hold for __________ seconds.  Use the stick to assist in returning your right / left arm to its starting position. Repeat __________ times. Complete this exercise __________ times per day.  STRETCH - Flexion, Standing   Stand facing a wall. Walk your right / left fingers up the wall until you feel a moderate stretch in your shoulder. As your hand gets higher, you may need to step closer to the wall or use a door frame to walk through.  Try to avoid shrugging your right / left shoulder as your arm rises, by keeping your shoulder blade tucked down and toward your mid-back spine.  Hold for __________ seconds. Use your other hand, if needed, to ease out of the stretch and return to the starting position. Repeat __________ times. Complete this exercise __________ times per day.  ROM - Internal Rotation   Using underhand grips, grasp a stick behind your back with both hands.  While standing upright with good posture, slide the stick up your back until you feel a mild stretch in the front of your shoulder.  Hold for __________ seconds. Slowly return to your starting position. Repeat __________ times. Complete this exercise __________ times per day.  STRETCH - Internal Rotation  Place your  right / left hand behind your back, palm-up.  Throw a towel or belt over your opposite shoulder. Grasp the towel with your right / left hand.  While keeping an upright posture, gently pull up on the towel until you feel a stretch in the front of your right / left shoulder.  Avoid shrugging your right / left shoulder as your arm rises, by keeping your shoulder blade tucked down and toward your mid-back spine.  Hold for __________ seconds. Release the stretch by lowering your opposite hand. Repeat __________ times. Complete this exercise __________ times per day. STRENGTHENING EXERCISES - Biceps Tendon Tendinitis (Proximal) and Tenosynovitis These exercises may help you regain your strength after your physician has discontinued your restraint in a cast  or brace. They may resolve your symptoms with or without further involvement from your physician, physical therapist or athletic trainer. While completing these exercises, remember:   Muscles can gain both the endurance and the strength needed for everyday activities through controlled exercises.  Complete these exercises as instructed by your physician, physical therapist or athletic trainer. Increase the resistance and repetitions only as guided.  You may experience muscle soreness or fatigue, but the pain or discomfort you are trying to eliminate should never worsen during these exercises. If this pain does get worse, stop and make sure you are following the directions exactly. If the pain is still present after adjustments, discontinue the exercise until you can discuss the trouble with your caregiver. STRENGTH - Elbow Flexors, Isometric  Stand or sit upright on a firm surface. Place your right / left arm so that your hand is palm-up and at the height of your waist.  Place your opposite hand on top of your forearm. Gently push down as your right / left arm resists. Push as hard as you can with both arms, without causing any pain or movement at  your right / left elbow. Hold this stationary position for __________ seconds.  Gradually release the tension in both arms. Allow your muscles to relax completely before repeating. Repeat __________ times. Complete this exercise __________ times per day. STRENGTH - Shoulder Flexion, Isometric  With good posture and facing a wall, stand or sit about 4-6 inches away.  Keeping your right / left elbow straight, gently press the top of your fist into the wall. Increase the pressure gradually until you are pressing as hard as you can, without shrugging your shoulder or increasing any shoulder discomfort.  Hold for __________ seconds.  Release the tension slowly. Relax your shoulder muscles completely before you start the next repetition. Repeat __________ times. Complete this exercise __________ times per day.  STRENGTH - Elbow Flexors, Supinated  With good posture, stand or sit on a firm chair without armrests. Allow your right / left arm to rest at your side with your palm facing forward.  Holding a __________ weight, or gripping a rubber exercise band or tubing,  bring your hand toward your shoulder.  Allow your muscles to control the resistance as your hand returns to your side. Repeat __________ times. Complete this exercise __________ times per day.  STRENGTH - Shoulder Flexion  Stand or sit with good posture. Grasp a __________ weight, or an exercise band or tubing, so that your hand is "thumbs-up," like when you shake hands.  Slowly lift your right / left arm as far as you can, without increasing any shoulder pain. At first, many people can only raise their hand to shoulder height.  Avoid shrugging your right / left shoulder as your arm rises, by keeping your shoulder blade tucked down and toward your mid-back spine.  Hold for __________ seconds. Control the descent of your hand as you slowly return to your starting position. Repeat __________ times. Complete this exercise  __________ times per day.   This information is not intended to replace advice given to you by your health care provider. Make sure you discuss any questions you have with your health care provider.   Document Released: 05/13/2005 Document Revised: 06/03/2014 Document Reviewed: 08/25/2008 Elsevier Interactive Patient Education [employer].

## 2015-04-04 NOTE — Progress Notes (Signed)
Subjective:    Patient ID: [name] [name], male    DOB: [birth year], 64 y.o.   MRN: [id number]  HPI 64 y.o. WM presents with right shoulder/arm pain s/p fall. Fell from ladder into the crook of a tree and immediately after was pulling a limb he had cut down and fell backwards onto his right elbow/shoulder on the asphalt on Sunday. Did not hit head, no LOC. Right elbow and right shoulder pain, has sharp pain from right shoulder down to his elbow, worse with right shoulder abduction. Denies numbness,tingling, weakness, in his right arm. No CP, SOB, dizziness, changes in vision. Has alternated ice/heat, and ibuprofen that has helped some.   Blood pressure 120/80, pulse 62, temperature 97.5 F (36.4 C), temperature source Temporal, resp. rate 14, height 5' 7.5" (1.715 m), weight 232 lb (105.235 kg), SpO2 95 %.  Past Medical History  Diagnosis Date  . Cardiomegaly   . Mixed hyperlipidemia   . Hypertension   . Hypogonadism male   . Sleep apnea   . Obesity   . Colon polyp   . Gallstones   . Pancreas (digestive gland) works poorly ([place])   . Prediabetes   . Vitamin D deficiency   . Fatty liver disease, nonalcoholic    Current Outpatient Prescriptions on File Prior to Visit  Medication Sig Dispense Refill  . Ascorbic Acid (VITAMIN C) 1000 MG tablet Take 1,000 mg by mouth daily.     Marland Kitchen aspirin 325 MG tablet Take 325 mg by mouth daily.      . B Complex Vitamins (B COMPLEX PO) Take by mouth. Takes M,W,F    . Cholecalciferol (VITAMIN D PO) Take 6,000 Int'l Units by mouth daily.    Marland Kitchen EPINEPHrine (EPIPEN 2-PAK) 0.3 mg/0.3 mL IJ SOAJ injection Inject 0.3 mLs (0.3 mg total) into the muscle once. 2 Device 0  . fenofibrate (TRICOR) 145 MG tablet Take 1 tablet (145 mg total) by mouth daily. 90 tablet 3  . fish oil-omega-3 fatty acids 1000 MG capsule Take 2 g by mouth 3 (three) times daily.     . Flaxseed, Linseed, (FLAX SEED OIL) 1000 MG CAPS Take 2,000 mg by mouth daily.    . hydrochlorothiazide  (HYDRODIURIL) 25 MG tablet Take 1 tablet (25 mg total) by mouth every morning. 90 tablet PRN  . levothyroxine (SYNTHROID, LEVOTHROID) 50 MCG tablet TAKE ONE TABLET BY MOUTH ONCE DAILY 90 tablet 1  . MAGNESIUM PO Take 650 mg by mouth daily.     . metoprolol tartrate (LOPRESSOR) 25 MG tablet Take 1 tablet (25 mg total) by mouth 2 (two) times daily. 180 tablet PRN  . Multiple Vitamin (MULTIVITAMIN) capsule Take 1 capsule by mouth daily.      . sertraline (ZOLOFT) 100 MG tablet Take 1 & 1/2 to 2 tablets daily as directed for mood 180 tablet 1  . Syringe/Needle, Disp, (SYRINGE 3CC/21GX1") 21G X 1" 3 ML MISC Use AD with testosterone Rx 12 each 1  . testosterone cypionate (DEPOTESTOSTERONE CYPIONATE) 200 MG/ML injection Inject 2 mLs (400 mg total) into the muscle every 14 (fourteen) days. 10 mL 1  . valsartan (DIOVAN) 320 MG tablet Take 1/2 to 1 tablet daily 90 tablet 3  . ZETIA 10 MG tablet TAKE 1 TABLET DAILY 90 tablet 3  . ezetimibe (ZETIA) 10 MG tablet Take 1 tablet (10 mg total) by mouth daily. 90 tablet 4  . tadalafil (CIALIS) 5 MG tablet Take 1 tablet (5 mg total) by mouth as needed  for erectile dysfunction. 90 tablet 1   No current facility-administered medications on file prior to visit.   Review of Systems  Constitutional: Negative.  Negative for fever, chills and fatigue.  HENT: Negative.   Respiratory: Negative.  Negative for shortness of breath.   Cardiovascular: Negative.  Negative for chest pain.  Gastrointestinal: Negative.   Genitourinary: Negative.   Musculoskeletal: Positive for myalgias and arthralgias. Negative for back pain, joint swelling, gait problem, neck pain and neck stiffness.  Skin: Negative.  Negative for rash.  Neurological: Negative.  Negative for dizziness, syncope, facial asymmetry, light-headedness and headaches.       Objective:   Physical Exam  Constitutional: He is oriented to person, place, and time. He appears well-developed and well-nourished.  HENT:   Head: Normocephalic and atraumatic.  Neck: Normal range of motion. Neck supple.  Cardiovascular: Normal rate and regular rhythm.   Pulmonary/Chest: Effort normal and breath sounds normal.  Abdominal: Soft. Bowel sounds are normal. There is no tenderness.  Musculoskeletal:  Right clavicle without deformity, + tenderness right biceps tendon and right lateral epicondyl without erythema, warmth, swelling, or deformity. Full ROM wrist and elbow without pain. Limited ROM right shoulder due to pain with active abduction but able to take to 180 degrees with discomfort. Pain with internal rotation.   Neurological: He is alert and oriented to person, place, and time. No cranial nerve deficit. Coordination normal.  Skin: Skin is warm and dry. No rash noted.       Assessment & Plan:  Right shoulder pain Likely partial tear and biceps tendonitis  No need for Xray at this time Prednisone, tylenol, RICE, and ROM exercises, if not better will need MRI/referral ortho to rule out tear

## 2015-04-11 ENCOUNTER — Ambulatory Visit: Payer: BC Managed Care – PPO | Admitting: Internal Medicine

## 2015-04-13 ENCOUNTER — Ambulatory Visit (INDEPENDENT_AMBULATORY_CARE_PROVIDER_SITE_OTHER): Payer: BLUE CROSS/BLUE SHIELD | Admitting: Internal Medicine

## 2015-04-13 ENCOUNTER — Encounter: Payer: Self-pay | Admitting: Internal Medicine

## 2015-04-13 VITALS — BP 124/76 | HR 74 | Ht 67.0 in | Wt 231.8 lb

## 2015-04-13 DIAGNOSIS — I10 Essential (primary) hypertension: Secondary | ICD-10-CM | POA: Diagnosis not present

## 2015-04-13 DIAGNOSIS — G4733 Obstructive sleep apnea (adult) (pediatric): Secondary | ICD-10-CM | POA: Diagnosis not present

## 2015-04-13 NOTE — Patient Instructions (Signed)
We can continue CPAP 12.5/ advanced  Order- [facility]- replacement mask of choice and supplies      Dx OSA  Please call if we can help

## 2015-04-13 NOTE — Progress Notes (Signed)
04/05/11- 64 year old male former smoker seen at kind request of Dr. Melford Aase because of sleep apnea. I had seen him in the past about 7 years ago NPSG 08/25/95- Severe obstructive sleep apnea, AHI 58/hr. He is happy with CPAP but his machine is getting very old. Pressure is set at 12.5. He describes good compliance and control and wears his CPAP every night and for naps. He needs replacement of mask. Bedtime 8 to 9 PM estimating sleep latency 10 minutes and waking 3 times during the night before finally up at 4 AM.  No history of ENT surgery, heart or lung disease. Treated for high blood pressure. Weight today is 233 pounds, up 20 pounds from 1997. He is married, working as a Geneticist, molecular. He smoked 2 packs per day for 7 years, ending in 1976.  04/06/12- 50year-old male former smoker followed for sleep apnea complicated by obesity, HBP  Patient states that he wears CPAP 12.5/ Advanced nightly x 6-8hrs. Denies problems with mask or pressure.  He got a replacement CPAP machine last year and is doing very well with good compliance and control. He usually does not use his humidifier. He is working from 6 AM until 2:30 PM and then takes an occasional nap.  04/16/13-  49year-old male former smoker followed for sleep apnea complicated by obesity, HBP FOLLOWS FOR:  Wearing CPAP 7-8 hours per night. No concerns today He likes his new CPAP machine-CPAP 12.5/Advanced, nasal mask. Sleeps well with no snoring.  04/18/14-  46year-old male former smoker followed for sleep apnea complicated by obesity, HBP FOLLOWS FOR: DME is [facility]; needs order for supplies and mask of choice. Wears CPAP 12.5/ Advanced every night for about 6-8 hours. Need to get download and enroll in [place]. Has been using CPAP for 19 years  04/13/2015-64 year old male former smoker followed for obstructive sleep apnea complicated by obesity, HBP CPAP 12.5/Advanced FOLLOWS FOR: DME is [facility]-will take card to the office for download(will place  order) and new supplies. Pt wears CPAP every night for about 6-8 hours. He feels well controlled with CPAP, sleeping well without breakthrough snoring.  ROS- see HPI Constitutional:   No-   weight loss, night sweats, fevers, chills, fatigue, lassitude. HEENT:   No-  headaches, difficulty swallowing, tooth/dental problems, sore throat,       No-  sneezing, itching, ear ache, nasal congestion, post nasal drip,  CV:  No-   chest pain, orthopnea, PND, swelling in lower extremities, anasarca, dizziness, palpitations Resp: No-   shortness of breath with exertion or at rest.              No-   productive cough,  No non-productive cough,  No- coughing up of blood.              No-   change in color of mucus.  No- wheezing.   Skin: No-   rash or lesions. GI:  No-   heartburn, indigestion, abdominal pain, nausea, vomiting,  GU: . MS:  No-   joint pain or swelling.  Neuro-     nothing unusual Psych:  No- change in mood or affect. No depression or anxiety.  No memory loss.  OBJ  General- Alert, Oriented, Affect-appropriate, Distress- none acute; overweight. + Full beard Skin- rash-none, lesions- none, excoriation- none Lymphadenopathy- none Head- atraumatic            Eyes- Gross vision intact, PERRLA, conjunctivae clear secretions            Ears- Hearing,  canals-normal            Nose- Clear, no-Septal dev, mucus, polyps, erosion, perforation             Throat- Mallampati III-IV , mucosa +red, drainage- none, tonsils- atrophic Neck- flexible , trachea midline, no stridor , thyroid nl, carotid no bruit Chest - symmetrical excursion , unlabored           Heart/CV- RRR , no murmur , no gallop  , no rub, nl s1 s2                           - JVD- none , edema- none, stasis changes- none, varices- none           Lung- clear to P&A, wheeze- none, cough- none , dullness-none, rub- none           Chest wall-  Abd-  Br/ Gen/ Rectal- Not done, not indicated Extrem- cyanosis- none, clubbing, none,  atrophy- none, strength- nl Neuro- grossly intact to observation

## 2015-04-14 NOTE — Assessment & Plan Note (Signed)
CPAP is helpful and he describes good compliance and control. Needs replacement mask and hose.

## 2015-04-14 NOTE — Assessment & Plan Note (Signed)
We reviewed interaction between blood pressure and untreated sleep apnea

## 2015-06-11 ENCOUNTER — Other Ambulatory Visit: Payer: Self-pay | Admitting: Internal Medicine

## 2015-06-30 ENCOUNTER — Ambulatory Visit: Payer: Self-pay | Admitting: Physician Assistant

## 2015-07-02 ENCOUNTER — Other Ambulatory Visit: Payer: Self-pay | Admitting: Internal Medicine

## 2015-07-06 ENCOUNTER — Ambulatory Visit (INDEPENDENT_AMBULATORY_CARE_PROVIDER_SITE_OTHER): Payer: BLUE CROSS/BLUE SHIELD | Admitting: Physician Assistant

## 2015-07-06 ENCOUNTER — Encounter: Payer: Self-pay | Admitting: Physician Assistant

## 2015-07-06 VITALS — BP 140/80 | HR 65 | Temp 97.3°F | Resp 16 | Ht 67.5 in | Wt 234.0 lb

## 2015-07-06 DIAGNOSIS — E349 Endocrine disorder, unspecified: Secondary | ICD-10-CM

## 2015-07-06 DIAGNOSIS — E782 Mixed hyperlipidemia: Secondary | ICD-10-CM | POA: Diagnosis not present

## 2015-07-06 DIAGNOSIS — E291 Testicular hypofunction: Secondary | ICD-10-CM

## 2015-07-06 DIAGNOSIS — R7303 Prediabetes: Secondary | ICD-10-CM

## 2015-07-06 DIAGNOSIS — E669 Obesity, unspecified: Secondary | ICD-10-CM

## 2015-07-06 DIAGNOSIS — Z79899 Other long term (current) drug therapy: Secondary | ICD-10-CM | POA: Diagnosis not present

## 2015-07-06 DIAGNOSIS — E559 Vitamin D deficiency, unspecified: Secondary | ICD-10-CM | POA: Diagnosis not present

## 2015-07-06 DIAGNOSIS — E039 Hypothyroidism, unspecified: Secondary | ICD-10-CM

## 2015-07-06 DIAGNOSIS — I10 Essential (primary) hypertension: Secondary | ICD-10-CM | POA: Diagnosis not present

## 2015-07-06 LAB — LIPID PANEL
CHOLESTEROL: 196 mg/dL (ref 125–200)
HDL: 34 mg/dL — ABNORMAL LOW (ref >=40)
LDL Cholesterol: 112 mg/dL (ref <130)
TRIGLYCERIDES: 249 mg/dL — AB (ref <150)
Total CHOL/HDL Ratio: 5.8 Ratio — ABNORMAL HIGH (ref <=5.0)
VLDL: 50 mg/dL — AB (ref <30)

## 2015-07-06 LAB — CBC WITH DIFFERENTIAL/PLATELET
BASOS ABS: 0 10*3/uL (ref 0.0–0.1)
Basophils Relative: 1 % (ref 0–1)
EOS ABS: 0.2 10*3/uL (ref 0.0–0.7)
EOS PCT: 5 % (ref 0–5)
HEMATOCRIT: 46.3 % (ref 39.0–52.0)
HEMOGLOBIN: 15.7 g/dL (ref 13.0–17.0)
LYMPHS ABS: 1.3 10*3/uL (ref 0.7–4.0)
LYMPHS PCT: 30 % (ref 12–46)
MCH: 30.7 pg (ref 26.0–34.0)
MCHC: 33.9 g/dL (ref 30.0–36.0)
MCV: 90.6 fL (ref 78.0–100.0)
MONOS PCT: 13 % — AB (ref 3–12)
MPV: 9.7 fL (ref 8.6–12.4)
Monocytes Absolute: 0.6 10*3/uL (ref 0.1–1.0)
Neutro Abs: 2.2 10*3/uL (ref 1.7–7.7)
Neutrophils Relative %: 51 % (ref 43–77)
Platelets: 222 10*3/uL (ref 150–400)
RBC: 5.11 MIL/uL (ref 4.22–5.81)
RDW: 15.8 % — ABNORMAL HIGH (ref 11.5–15.5)
WBC: 4.3 10*3/uL (ref 4.0–10.5)

## 2015-07-06 LAB — BASIC METABOLIC PANEL WITH GFR
BUN: 23 mg/dL (ref 7–25)
CO2: 30 mmol/L (ref 20–31)
Calcium: 10.6 mg/dL — ABNORMAL HIGH (ref 8.6–10.3)
Chloride: 102 mmol/L (ref 98–110)
Creat: 1.21 mg/dL (ref 0.70–1.25)
GFR, EST AFRICAN AMERICAN: 73 mL/min (ref >=60)
GFR, EST NON AFRICAN AMERICAN: 63 mL/min (ref >=60)
GLUCOSE: 91 mg/dL (ref 65–99)
POTASSIUM: 5 mmol/L (ref 3.5–5.3)
Sodium: 141 mmol/L (ref 135–146)

## 2015-07-06 LAB — MAGNESIUM: Magnesium: 2.3 mg/dL (ref 1.5–2.5)

## 2015-07-06 LAB — HEPATIC FUNCTION PANEL
ALT: 74 U/L — ABNORMAL HIGH (ref 9–46)
AST: 56 U/L — ABNORMAL HIGH (ref 10–35)
Albumin: 4.9 g/dL (ref 3.6–5.1)
Alkaline Phosphatase: 39 U/L — ABNORMAL LOW (ref 40–115)
Bilirubin, Direct: 0.1 mg/dL
Indirect Bilirubin: 0.6 mg/dL (ref 0.2–1.2)
Total Bilirubin: 0.7 mg/dL (ref 0.2–1.2)
Total Protein: 7.4 g/dL (ref 6.1–8.1)

## 2015-07-06 LAB — HEMOGLOBIN A1C
Hgb A1c MFr Bld: 5.9 % — ABNORMAL HIGH
Mean Plasma Glucose: 123 mg/dL — ABNORMAL HIGH

## 2015-07-06 LAB — TSH: TSH: 1.51 m[IU]/L (ref 0.40–4.50)

## 2015-07-06 NOTE — Patient Instructions (Signed)
We want weight loss that will last so you should lose 1-2 pounds a week.  THAT IS IT! Please pick THREE things a month to change. Once it is a habit check off the item. Then pick another three items off the list to become habits.  If you are already doing a habit on the list GREAT!  Cross that item off! o Don't drink your calories. Ie, alcohol, soda, fruit juice, and sweet tea.  o Drink more water. Drink a glass when you feel hungry or before each meal.  o Eat breakfast - Complex carb and protein (likeDannon light and fit yogurt, oatmeal, fruit, eggs, turkey bacon). o Measure your cereal.  Eat no more than one cup a day. (ie Kashi) o Eat an apple a day. o Add a vegetable a day. o Try a new vegetable a month. o Use Pam! Stop using oil or butter to cook. o Don't finish your plate or use smaller plates. o Share your dessert. o Eat sugar free Jello for dessert or frozen grapes. o Don't eat 2-3 hours before bed. o Switch to whole wheat bread, pasta, and brown rice. o Make healthier choices when you eat out. No fries! o Pick baked chicken, NOT fried. o Don't forget to SLOW DOWN when you eat. It is not going anywhere.  o Take the stairs. o Park far away in the parking lot o Lift soup cans (or weights) for 10 minutes while watching TV. o Walk at work for 10 minutes during break. o Walk outside 1 time a week with your friend, kids, dog, or significant other. o Start a walking group at church. o Walk the mall as much as you can tolerate.  o Keep a food diary. o Weigh yourself daily. o Walk for 15 minutes 3 days per week. o Cook at home more often and eat out less.  If life happens and you go back to old habits, it is okay.  Just start over. You can do it!   If you experience chest pain, get short of breath, or tired during the exercise, please stop immediately and inform your doctor.   Before you even begin to attack a weight-loss plan, it pays to remember this: You are not fat. You have fat.  Losing weight isn't about blame or shame; it's simply another achievement to accomplish. Dieting is like any other skill-you have to buckle down and work at it. As long as you act in a smart, reasonable way, you'll ultimately get where you want to be. Here are some weight loss pearls for you.  1. It's Not a Diet. It's a Lifestyle Thinking of a diet as something you're on and suffering through only for the short term doesn't work. To shed weight and keep it off, you need to make permanent changes to the way you eat. It's OK to indulge occasionally, of course, but if you cut calories temporarily and then revert to your old way of eating, you'll gain back the weight quicker than you can say yo-yo. Use it to lose it. Research shows that one of the best predictors of long-term weight loss is how many pounds you drop in the first month. For that reason, nutritionists often suggest being stricter for the first two weeks of your new eating strategy to build momentum. Cut out added sugar and alcohol and avoid unrefined carbs. After that, figure out how you can reincorporate them in a way that's healthy and maintainable.  2. There's a Right   Way to Exercise Working out burns calories and fat and boosts your metabolism by building muscle. But those trying to lose weight are notorious for overestimating the number of calories they burn and underestimating the amount they take in. Unfortunately, your system is biologically programmed to hold on to extra pounds and that means when you start exercising, your body senses the deficit and ramps up its hunger signals. If you're not diligent, you'll eat everything you burn and then some. Use it to lose it. Cardio gets all the exercise glory, but strength and interval training are the real heroes. They help you build lean muscle, which in turn increases your metabolism and calorie-burning ability 3. Don't Overreact to Mild Hunger Some people have a hard time losing weight because  of hunger anxiety. To them, being hungry is bad-something to be avoided at all costs-so they carry snacks with them and eat when they don't need to. Others eat because they're stressed out or bored. While you never want to get to the point of being ravenous (that's when bingeing is likely to happen), a hunger pang, a craving, or the fact that it's 3:00 p.m. should not send you racing for the vending machine or obsessing about the energy bar in your purse. Ideally, you should put off eating until your stomach is growling and it's difficult to concentrate.  Use it to lose it. When you feel the urge to eat, use the HALT method. Ask yourself, Am I really hungry? Or am I angry or anxious, lonely or bored, or tired? If you're still not certain, try the apple test. If you're truly hungry, an apple should seem delicious; if it doesn't, something else is going on. Or you can try drinking water and making yourself busy, if you are still hungry try a healthy snack.  4. Not All Calories Are Created Equal The mechanics of weight loss are pretty simple: Take in fewer calories than you use for energy. But the kind of food you eat makes all the difference. Processed food that's high in saturated fat and refined starch or sugar can cause inflammation that disrupts the hormone signals that tell your brain you're full. The result: You eat a lot more.  Use it to lose it. Clean up your diet. Swap in whole, unprocessed foods, including vegetables, lean protein, and healthy fats that will fill you up and give you the biggest nutritional bang for your calorie buck. In a few weeks, as your brain starts receiving regular hunger and fullness signals once again, you'll notice that you feel less hungry overall and naturally start cutting back on the amount you eat.  5. Protein, Produce, and Plant-Based Fats Are Your Weight-Loss Trinity Here's why eating the three Ps regularly will help you drop pounds. Protein fills you up. You need it  to build lean muscle, which keeps your metabolism humming so that you can torch more fat. People in a weight-loss program who ate double the recommended daily allowance for protein (about 110 grams for a 150-pound woman) lost 70 percent of their weight from fat, while people who ate the RDA lost only about 40 percent, one study found. Produce is packed with filling fiber. "It's very difficult to consume too many calories if you're eating a lot of vegetables. Example: Three cups of broccoli is a lot of food, yet only 93 calories. (Fruit is another story. It can be easy to overeat and can contain a lot of calories from sugar, so be sure to   monitor your intake.) Plant-based fats like olive oil and those in avocados and nuts are healthy and extra satiating.  Use it to lose it. Aim to incorporate each of the three Ps into every meal and snack. People who eat protein throughout the day are able to keep weight off, according to a study in the American Journal of Clinical Nutrition. In addition to meat, poultry and seafood, good sources are beans, lentils, eggs, tofu, and yogurt. As for fat, keep portion sizes in check by measuring out salad dressing, oil, and nut butters (shoot for one to two tablespoons). Finally, eat veggies or a little fruit at every meal. People who did that consumed 308 fewer calories but didn't feel any hungrier than when they didn't eat more produce.  7. How You Eat Is As Important As What You Eat In order for your brain to register that you're full, you need to focus on what you're eating. Sit down whenever you eat, preferably at a table. Turn off the TV or computer, put down your phone, and look at your food. Smell it. Chew slowly, and don't put another bite on your fork until you swallow. When women ate lunch this attentively, they consumed 30 percent less when snacking later than those who listened to an audiobook at lunchtime, according to a study in the British Journal of Nutrition. 8.  Weighing Yourself Really Works The scale provides the best evidence about whether your efforts are paying off. Seeing the numbers tick up or down or stagnate is motivation to keep going-or to rethink your approach. A 2015 study at [employer] found that daily weigh-ins helped people lose more weight, keep it off, and maintain that loss, even after two years. Use it to lose it. Step on the scale at the same time every day for the best results. If your weight shoots up several pounds from one weigh-in to the next, don't freak out. Eating a lot of salt the night before or having your period is the likely culprit. The number should return to normal in a day or two. It's a steady climb that you need to do something about. 9. Too Much Stress and Too Little Sleep Are Your Enemies When you're tired and frazzled, your body cranks up the production of cortisol, the stress hormone that can cause carb cravings. Not getting enough sleep also boosts your levels of ghrelin, a hormone associated with hunger, while suppressing leptin, a hormone that signals fullness and satiety. People on a diet who slept only five and a half hours a night for two weeks lost 55 percent less fat and were hungrier than those who slept eight and a half hours, according to a study in the [employer] Medical Association Journal. Use it to lose it. Prioritize sleep, aiming for seven hours or more a night, which research shows helps lower stress. And make sure you're getting quality zzz's. If a snoring spouse or a fidgety cat wakes you up frequently throughout the night, you may end up getting the equivalent of just four hours of sleep, according to a study from [employer]. Keep pets out of the bedroom, and use a white-noise app to drown out snoring. 10. You Will Hit a plateau-And You Can Bust Through It As you slim down, your body releases much less leptin, the fullness hormone.  If you're not strength training, start right now.  Building muscle can raise your metabolism to help you overcome a plateau. To keep your body challenged   and burning calories, incorporate new moves and more intense intervals into your workouts or add another sweat session to your weekly routine. Alternatively, cut an extra 100 calories or so a day from your diet. Now that you've lost weight, your body simply doesn't need as much fuel.   

## 2015-07-06 NOTE — Progress Notes (Signed)
Assessment and Plan:  1. Hypertension -Continue medication, monitor blood pressure at home. Continue DASH diet.  Reminder to go to the ER if any CP, SOB, nausea, dizziness, severe HA, changes vision/speech, left arm numbness and tingling and jaw pain.  2. Cholesterol -Continue diet and exercise. Check cholesterol.   3. Prediabetes  -Continue diet and exercise. Check A1C  4. Vitamin D Def - check level and continue medications.   5. Hypothyroidism -check TSH level, continue medications the same, reminded to take on an empty stomach 30-86mins before food.   6. Obesity with co morbidities - long discussion about weight loss, diet, and exercise  7. Hypogonadism - continue replacement therapy, check testosterone levels as needed.    Continue diet and meds as discussed. Further disposition pending results of labs. Over 30 minutes of exam, counseling, chart review, and critical decision making was performed  HPI 65 y.o. male  presents for 3 month follow up on hypertension, cholesterol, prediabetes, and vitamin D deficiency.   His blood pressure has been controlled at home, today their BP is BP: 140/80 mmHg  He does not workout. He denies chest pain, shortness of breath, dizziness. He is planning on retiring in Sept when he gets medicare and is very happy about this.   He is on cholesterol medication, last visit his lipitor was stopped due to elevated CPK, he is on fenofibrate and zetia and denies myalgias. His cholesterol is not at goal. The cholesterol last visit was:   Lab Results  Component Value Date   CHOL 180 03/17/2015   HDL 35* 03/17/2015   LDLCALC 100 03/17/2015   TRIG 227* 03/17/2015   CHOLHDL 5.1* 03/17/2015    He has been working on diet and exercise for prediabetes, and denies paresthesia of the feet, polydipsia, polyuria and visual disturbances. Last A1C in the office was:  Lab Results  Component Value Date   HGBA1C 5.8* 03/17/2015   Patient is on Vitamin D  supplement.   Lab Results  Component Value Date   VD25OH 11 03/17/2015     He is on zoloft for depression.  He is on thyroid medication. His medication was not changed last visit.   Lab Results  Component Value Date   TSH 1.571 03/17/2015  .  He has a history of testosterone deficiency and is on testosterone replacement, last shot was 2 weeks ago, due this weekend. He states that the testosterone helps with his energy, libido, muscle mass. Lab Results  Component Value Date   TESTOSTERONE 357 03/17/2015   BMI is Body mass index is 36.09 kg/(m^2)., he is working on diet and exercise. Wt Readings from Last 3 Encounters:  07/06/15 234 lb (106.142 kg)  04/13/15 231 lb 12.8 oz (105.144 kg)  04/04/15 232 lb (105.235 kg)     Current Medications:  Current Outpatient Prescriptions on File Prior to Visit  Medication Sig Dispense Refill  . Ascorbic Acid (VITAMIN C) 1000 MG tablet Take 1,000 mg by mouth daily.     Marland Kitchen aspirin 325 MG tablet Take 325 mg by mouth daily.      . B Complex Vitamins (B COMPLEX PO) Take by mouth. Takes M,W,F    . Cholecalciferol (VITAMIN D PO) Take 6,000 Int'l Units by mouth daily.    Marland Kitchen EPINEPHrine (EPIPEN 2-PAK) 0.3 mg/0.3 mL IJ SOAJ injection Inject 0.3 mLs (0.3 mg total) into the muscle once. 2 Device 0  . fenofibrate (TRICOR) 145 MG tablet Take 1 tablet (145 mg total) by mouth  daily. 90 tablet 3  . fish oil-omega-3 fatty acids 1000 MG capsule Take 2 g by mouth 3 (three) times daily.     . Flaxseed, Linseed, (FLAX SEED OIL) 1000 MG CAPS Take 2,000 mg by mouth daily.    . hydrochlorothiazide (HYDRODIURIL) 25 MG tablet Take 1 tablet (25 mg total) by mouth every morning. 90 tablet PRN  . levothyroxine (SYNTHROID, LEVOTHROID) 50 MCG tablet TAKE ONE TABLET BY MOUTH ONCE DAILY 90 tablet 1  . MAGNESIUM PO Take 650 mg by mouth daily.     . metoprolol tartrate (LOPRESSOR) 25 MG tablet TAKE 1 TABLET TWICE A DAY 180 tablet 1  . Multiple Vitamin (MULTIVITAMIN) capsule Take 1  capsule by mouth daily.      . sertraline (ZOLOFT) 100 MG tablet TAKE ONE AND ONE-HALF TO TWO TABLETS DAILY AS DIRECTED FOR MOOD 180 tablet 1  . Syringe/Needle, Disp, (SYRINGE 3CC/21GX1") 21G X 1" 3 ML MISC Use AD with testosterone Rx 12 each 1  . testosterone cypionate (DEPOTESTOSTERONE CYPIONATE) 200 MG/ML injection Inject 2 mLs (400 mg total) into the muscle every 14 (fourteen) days. 10 mL 1  . valsartan (DIOVAN) 320 MG tablet Take 1/2 to 1 tablet daily 90 tablet 3  . ZETIA 10 MG tablet TAKE 1 TABLET DAILY 90 tablet 3  . tadalafil (CIALIS) 5 MG tablet Take 1 tablet (5 mg total) by mouth as needed for erectile dysfunction. 90 tablet 1   No current facility-administered medications on file prior to visit.   Medical History:  Past Medical History  Diagnosis Date  . Cardiomegaly   . Mixed hyperlipidemia   . Hypertension   . Hypogonadism male   . Sleep apnea   . Obesity   . Colon polyp   . Gallstones   . Pancreas (digestive gland) works poorly ([place])   . Prediabetes   . Vitamin D deficiency   . Fatty liver disease, nonalcoholic    Allergies:  Allergies  Allergen Reactions  . Ace Inhibitors     cough  . Bee Venom      Review of Systems:  Review of Systems  Constitutional: Negative.   HENT: Negative.   Eyes: Negative.   Respiratory: Negative.   Cardiovascular: Negative.   Gastrointestinal: Negative.   Genitourinary: Negative.   Musculoskeletal: Negative.   Skin: Negative.   Neurological: Negative.   Endo/Heme/Allergies: Negative.   Psychiatric/Behavioral: Negative.     Family history- Review and unchanged Social history- Review and unchanged Physical Exam: BP 140/80 mmHg  Pulse 65  Temp(Src) 97.3 F (36.3 C)  Resp 16  Ht 5' 7.5" (1.715 m)  Wt 234 lb (106.142 kg)  BMI 36.09 kg/m2  SpO2 97% Wt Readings from Last 3 Encounters:  07/06/15 234 lb (106.142 kg)  04/13/15 231 lb 12.8 oz (105.144 kg)  04/04/15 232 lb (105.235 kg)   General Appearance: Well  nourished, in no apparent distress, obese. Eyes: PERRLA, EOMs, conjunctiva no swelling or erythema Sinuses: No Frontal/maxillary tenderness ENT/Mouth: Ext aud canals clear, TMs without erythema, bulging. No erythema, swelling, or exudate on post pharynx.  Tonsils not swollen or erythematous. Hearing normal.  Neck: Supple, thyroid normal.  Respiratory: Respiratory effort normal, BS equal bilaterally without rales, rhonchi, wheezing or stridor.  Cardio: RRR with no MRGs. Brisk peripheral pulses without edema.  Abdomen: Soft, + BS, obese, with large well healing vertical and right horizontal scar,  Non tender, no guarding, rebound, hernias, masses. Lymphatics: Non tender without lymphadenopathy.  Musculoskeletal: Full ROM, 5/5 strength,  Normal gait Skin: Warm, dry without rashes, lesions, ecchymosis.  Neuro: Cranial nerves intact. Normal muscle tone, no cerebellar symptoms. Psych: Awake and oriented X 3, normal affect, Insight and Judgment appropriate.    Vicie Mutters, PA-C 9:44 AM [facility]

## 2015-07-07 LAB — VITAMIN D 25 HYDROXY (VIT D DEFICIENCY, FRACTURES): VIT D 25 HYDROXY: 50 ng/mL (ref 30–100)

## 2015-07-16 ENCOUNTER — Other Ambulatory Visit: Payer: Self-pay | Admitting: Internal Medicine

## 2015-10-17 ENCOUNTER — Ambulatory Visit (INDEPENDENT_AMBULATORY_CARE_PROVIDER_SITE_OTHER): Payer: BLUE CROSS/BLUE SHIELD | Admitting: Physician Assistant

## 2015-10-17 ENCOUNTER — Encounter: Payer: Self-pay | Admitting: Physician Assistant

## 2015-10-17 VITALS — BP 120/84 | HR 58 | Temp 97.3°F | Resp 16 | Ht 67.5 in | Wt 232.6 lb

## 2015-10-17 DIAGNOSIS — E559 Vitamin D deficiency, unspecified: Secondary | ICD-10-CM | POA: Diagnosis not present

## 2015-10-17 DIAGNOSIS — E291 Testicular hypofunction: Secondary | ICD-10-CM

## 2015-10-17 DIAGNOSIS — E349 Endocrine disorder, unspecified: Secondary | ICD-10-CM

## 2015-10-17 DIAGNOSIS — E782 Mixed hyperlipidemia: Secondary | ICD-10-CM | POA: Diagnosis not present

## 2015-10-17 DIAGNOSIS — Z79899 Other long term (current) drug therapy: Secondary | ICD-10-CM

## 2015-10-17 DIAGNOSIS — I10 Essential (primary) hypertension: Secondary | ICD-10-CM

## 2015-10-17 DIAGNOSIS — R7303 Prediabetes: Secondary | ICD-10-CM | POA: Diagnosis not present

## 2015-10-17 DIAGNOSIS — E039 Hypothyroidism, unspecified: Secondary | ICD-10-CM

## 2015-10-17 DIAGNOSIS — E669 Obesity, unspecified: Secondary | ICD-10-CM

## 2015-10-17 LAB — BASIC METABOLIC PANEL WITH GFR
BUN: 20 mg/dL (ref 7–25)
CHLORIDE: 102 mmol/L (ref 98–110)
CO2: 30 mmol/L (ref 20–31)
CREATININE: 1.07 mg/dL (ref 0.70–1.25)
Calcium: 9.9 mg/dL (ref 8.6–10.3)
GFR, Est African American: 84 mL/min (ref >=60)
GFR, Est Non African American: 73 mL/min (ref >=60)
GLUCOSE: 87 mg/dL (ref 65–99)
POTASSIUM: 4.5 mmol/L (ref 3.5–5.3)
Sodium: 140 mmol/L (ref 135–146)

## 2015-10-17 LAB — CBC WITH DIFFERENTIAL/PLATELET
BASOS ABS: 42 {cells}/uL (ref 0–200)
BASOS PCT: 1 %
EOS ABS: 252 {cells}/uL (ref 15–500)
Eosinophils Relative: 6 %
HEMATOCRIT: 50.9 % — AB (ref 38.5–50.0)
Hemoglobin: 17.2 g/dL — ABNORMAL HIGH (ref 13.2–17.1)
Lymphocytes Relative: 25 %
Lymphs Abs: 1050 cells/uL (ref 850–3900)
MCH: 30.9 pg (ref 27.0–33.0)
MCHC: 33.8 g/dL (ref 32.0–36.0)
MCV: 91.4 fL (ref 80.0–100.0)
MONO ABS: 588 {cells}/uL (ref 200–950)
MPV: 10.1 fL (ref 7.5–12.5)
Monocytes Relative: 14 %
NEUTROS ABS: 2268 {cells}/uL (ref 1500–7800)
Neutrophils Relative %: 54 %
Platelets: 198 10*3/uL (ref 140–400)
RBC: 5.57 MIL/uL (ref 4.20–5.80)
RDW: 13.8 % (ref 11.0–15.0)
WBC: 4.2 10*3/uL (ref 3.8–10.8)

## 2015-10-17 LAB — LIPID PANEL
Cholesterol: 177 mg/dL (ref 125–200)
HDL: 36 mg/dL — AB (ref >=40)
LDL Cholesterol: 100 mg/dL (ref <130)
Total CHOL/HDL Ratio: 4.9 Ratio (ref <=5.0)
Triglycerides: 203 mg/dL — ABNORMAL HIGH (ref <150)
VLDL: 41 mg/dL — ABNORMAL HIGH (ref <30)

## 2015-10-17 LAB — HEMOGLOBIN A1C
HEMOGLOBIN A1C: 6 % — AB (ref <5.7)
MEAN PLASMA GLUCOSE: 126 mg/dL

## 2015-10-17 LAB — HEPATIC FUNCTION PANEL
ALBUMIN: 4.6 g/dL (ref 3.6–5.1)
ALK PHOS: 38 U/L — AB (ref 40–115)
ALT: 70 U/L — AB (ref 9–46)
AST: 58 U/L — AB (ref 10–35)
Bilirubin, Direct: 0.1 mg/dL (ref <=0.2)
Indirect Bilirubin: 0.5 mg/dL (ref 0.2–1.2)
TOTAL PROTEIN: 6.9 g/dL (ref 6.1–8.1)
Total Bilirubin: 0.6 mg/dL (ref 0.2–1.2)

## 2015-10-17 LAB — MAGNESIUM: Magnesium: 2 mg/dL (ref 1.5–2.5)

## 2015-10-17 NOTE — Progress Notes (Signed)
Assessment and Plan:  1. Hypertension -Continue medication, monitor blood pressure at home. Continue DASH diet.  Reminder to go to the ER if any CP, SOB, nausea, dizziness, severe HA, changes vision/speech, left arm numbness and tingling and jaw pain.  2. Cholesterol -Continue diet and exercise. Check cholesterol.   3. Prediabetes  -Continue diet and exercise. Check A1C  4. Vitamin D Def - check level and continue medications.   5. Hypothyroidism -check TSH level, continue medications the same, reminded to take on an empty stomach 30-89mins before food.   6. Obesity with co morbidities - long discussion about weight loss, diet, and exercise  7. Hypogonadism - continue replacement therapy, check testosterone levels as needed.   RETIRING SEPT 1ST Continue diet and meds as discussed. Further disposition pending results of labs. Over 30 minutes of exam, counseling, chart review, and critical decision making was performed Future Appointments Date Time Provider [place]  04/15/2016 3:45 PM Deneise Lever, MD [facility] None    HPI 65 y.o. male  presents for 3 month follow up on hypertension, cholesterol, prediabetes, and vitamin D deficiency.   His blood pressure has been controlled at home, today their BP is BP: 120/84 mmHg  He does not workout. He denies chest pain, shortness of breath, dizziness. He is planning on retiring in Sept when he gets medicare and is very happy about this.   He is on cholesterol medication, last visit his lipitor was stopped due to elevated CPK, he is on fenofibrate and zetia and denies myalgias. His cholesterol is not at goal. The cholesterol last visit was:   Lab Results  Component Value Date   CHOL 196 07/06/2015   HDL 34* 07/06/2015   LDLCALC 112 07/06/2015   TRIG 249* 07/06/2015   CHOLHDL 5.8* 07/06/2015    He has been working on diet and exercise for prediabetes, and denies paresthesia of the feet, polydipsia, polyuria and visual  disturbances. Last A1C in the office was:  Lab Results  Component Value Date   HGBA1C 5.9* 07/06/2015   Patient is on Vitamin D supplement.   Lab Results  Component Value Date   VD25OH 82 07/06/2015     He is on zoloft for depression.  He is on thyroid medication. His medication was not changed last visit.   Lab Results  Component Value Date   TSH 1.51 07/06/2015  .  He has a history of testosterone deficiency and is on testosterone replacement, last shot was 2 weeks ago, due this weekend. He states that the testosterone helps with his energy, libido, muscle mass. Lab Results  Component Value Date   TESTOSTERONE 357 03/17/2015   BMI is Body mass index is 35.87 kg/(m^2)., he is working on diet and exercise. Wt Readings from Last 3 Encounters:  10/17/15 232 lb 9.6 oz (105.507 kg)  07/06/15 234 lb (106.142 kg)  04/13/15 231 lb 12.8 oz (105.144 kg)     Current Medications:  Current Outpatient Prescriptions on File Prior to Visit  Medication Sig Dispense Refill  . Ascorbic Acid (VITAMIN C) 1000 MG tablet Take 1,000 mg by mouth daily.     Marland Kitchen aspirin 325 MG tablet Take 325 mg by mouth daily.      . B Complex Vitamins (B COMPLEX PO) Take by mouth. Takes M,W,F    . Cholecalciferol (VITAMIN D PO) Take 6,000 Int'l Units by mouth daily.    Marland Kitchen EPINEPHrine (EPIPEN 2-PAK) 0.3 mg/0.3 mL IJ SOAJ injection Inject 0.3 mLs (0.3 mg  total) into the muscle once. 2 Device 0  . fenofibrate (TRICOR) 145 MG tablet Take 1 tablet (145 mg total) by mouth daily. 90 tablet 3  . fish oil-omega-3 fatty acids 1000 MG capsule Take 2 g by mouth 3 (three) times daily.     . Flaxseed, Linseed, (FLAX SEED OIL) 1000 MG CAPS Take 2,000 mg by mouth daily.    . hydrochlorothiazide (HYDRODIURIL) 25 MG tablet TAKE 1 TABLET EVERY MORNING 90 tablet 1  . levothyroxine (SYNTHROID, LEVOTHROID) 50 MCG tablet TAKE ONE TABLET BY MOUTH ONCE DAILY 90 tablet 1  . MAGNESIUM PO Take 650 mg by mouth daily.     . metoprolol tartrate  (LOPRESSOR) 25 MG tablet TAKE 1 TABLET TWICE A DAY 180 tablet 1  . Multiple Vitamin (MULTIVITAMIN) capsule Take 1 capsule by mouth daily.      . sertraline (ZOLOFT) 100 MG tablet TAKE ONE AND ONE-HALF TO TWO TABLETS DAILY AS DIRECTED FOR MOOD 180 tablet 1  . Syringe/Needle, Disp, (SYRINGE 3CC/21GX1") 21G X 1" 3 ML MISC Use AD with testosterone Rx 12 each 1  . testosterone cypionate (DEPOTESTOSTERONE CYPIONATE) 200 MG/ML injection Inject 2 mLs (400 mg total) into the muscle every 14 (fourteen) days. 10 mL 1  . valsartan (DIOVAN) 320 MG tablet Take 1/2 to 1 tablet daily 90 tablet 3  . ZETIA 10 MG tablet TAKE 1 TABLET DAILY 90 tablet 3  . tadalafil (CIALIS) 5 MG tablet Take 1 tablet (5 mg total) by mouth as needed for erectile dysfunction. 90 tablet 1   No current facility-administered medications on file prior to visit.   Medical History:  Past Medical History  Diagnosis Date  . Cardiomegaly   . Mixed hyperlipidemia   . Hypertension   . Hypogonadism male   . Sleep apnea   . Obesity   . Colon polyp   . Gallstones   . Pancreas (digestive gland) works poorly ([place])   . Prediabetes   . Vitamin D deficiency   . Fatty liver disease, nonalcoholic    Allergies:  Allergies  Allergen Reactions  . Ace Inhibitors     cough  . Bee Venom     Review of Systems:  Review of Systems  Constitutional: Negative.   HENT: Negative.   Eyes: Negative.   Respiratory: Negative.   Cardiovascular: Negative.   Gastrointestinal: Negative.   Genitourinary: Negative.   Musculoskeletal: Negative.   Skin: Negative.   Neurological: Negative.   Endo/Heme/Allergies: Negative.   Psychiatric/Behavioral: Negative.     Family history- Review and unchanged Social history- Review and unchanged Physical Exam: BP 120/84 mmHg  Pulse 58  Temp(Src) 97.3 F (36.3 C) (Temporal)  Resp 16  Ht 5' 7.5" (1.715 m)  Wt 232 lb 9.6 oz (105.507 kg)  BMI 35.87 kg/m2  SpO2 97% Wt Readings from Last 3 Encounters:   10/17/15 232 lb 9.6 oz (105.507 kg)  07/06/15 234 lb (106.142 kg)  04/13/15 231 lb 12.8 oz (105.144 kg)   General Appearance: Well nourished, in no apparent distress, obese. Eyes: PERRLA, EOMs, conjunctiva no swelling or erythema Sinuses: No Frontal/maxillary tenderness ENT/Mouth: Ext aud canals clear, TMs without erythema, bulging. No erythema, swelling, or exudate on post pharynx.  Tonsils not swollen or erythematous. Hearing normal.  Neck: Supple, thyroid normal.  Respiratory: Respiratory effort normal, BS equal bilaterally without rales, rhonchi, wheezing or stridor.  Cardio: RRR with no MRGs. Brisk peripheral pulses without edema.  Abdomen: Soft, + BS, obese, with large well healing vertical and  right horizontal scar,  Non tender, no guarding, rebound, hernias, masses. Lymphatics: Non tender without lymphadenopathy.  Musculoskeletal: Full ROM, 5/5 strength, Normal gait Skin: Warm, dry without rashes, lesions, ecchymosis.  Neuro: Cranial nerves intact. Normal muscle tone, no cerebellar symptoms. Psych: Awake and oriented X 3, normal affect, Insight and Judgment appropriate.    Vicie Mutters, PA-C 8:57 AM [facility]

## 2015-10-18 LAB — TSH: TSH: 1.61 mIU/L (ref 0.40–4.50)

## 2015-10-18 LAB — VITAMIN D 25 HYDROXY (VIT D DEFICIENCY, FRACTURES): VIT D 25 HYDROXY: 59 ng/mL (ref 30–100)

## 2015-11-19 ENCOUNTER — Other Ambulatory Visit: Payer: Self-pay | Admitting: Internal Medicine

## 2015-11-21 ENCOUNTER — Other Ambulatory Visit: Payer: Self-pay

## 2015-11-30 ENCOUNTER — Encounter: Payer: Self-pay | Admitting: Physician Assistant

## 2015-11-30 ENCOUNTER — Ambulatory Visit (HOSPITAL_COMMUNITY)
Admission: RE | Admit: 2015-11-30 | Discharge: 2015-11-30 | Disposition: A | Discharge status: Home / Self Care | Payer: BLUE CROSS/BLUE SHIELD | Source: Ambulatory Visit | Attending: Physician Assistant | Admitting: Physician Assistant

## 2015-11-30 ENCOUNTER — Other Ambulatory Visit: Payer: Self-pay

## 2015-11-30 ENCOUNTER — Ambulatory Visit (INDEPENDENT_AMBULATORY_CARE_PROVIDER_SITE_OTHER): Payer: BLUE CROSS/BLUE SHIELD | Admitting: Physician Assistant

## 2015-11-30 VITALS — BP 132/80 | HR 67 | Temp 97.5°F | Ht 67.5 in | Wt 233.8 lb

## 2015-11-30 DIAGNOSIS — I10 Essential (primary) hypertension: Secondary | ICD-10-CM

## 2015-11-30 DIAGNOSIS — R0602 Shortness of breath: Secondary | ICD-10-CM | POA: Diagnosis not present

## 2015-11-30 DIAGNOSIS — R6889 Other general symptoms and signs: Secondary | ICD-10-CM

## 2015-11-30 DIAGNOSIS — N529 Male erectile dysfunction, unspecified: Secondary | ICD-10-CM

## 2015-11-30 DIAGNOSIS — Z87898 Personal history of other specified conditions: Secondary | ICD-10-CM

## 2015-11-30 DIAGNOSIS — R7303 Prediabetes: Secondary | ICD-10-CM

## 2015-11-30 DIAGNOSIS — Z0001 Encounter for general adult medical examination with abnormal findings: Secondary | ICD-10-CM

## 2015-11-30 DIAGNOSIS — Z136 Encounter for screening for cardiovascular disorders: Secondary | ICD-10-CM

## 2015-11-30 DIAGNOSIS — E291 Testicular hypofunction: Secondary | ICD-10-CM | POA: Diagnosis not present

## 2015-11-30 DIAGNOSIS — R05 Cough: Secondary | ICD-10-CM | POA: Diagnosis present

## 2015-11-30 DIAGNOSIS — M791 Myalgia: Secondary | ICD-10-CM

## 2015-11-30 DIAGNOSIS — E782 Mixed hyperlipidemia: Secondary | ICD-10-CM | POA: Diagnosis not present

## 2015-11-30 DIAGNOSIS — M21372 Foot drop, left foot: Secondary | ICD-10-CM

## 2015-11-30 DIAGNOSIS — G4733 Obstructive sleep apnea (adult) (pediatric): Secondary | ICD-10-CM

## 2015-11-30 DIAGNOSIS — R001 Bradycardia, unspecified: Secondary | ICD-10-CM

## 2015-11-30 DIAGNOSIS — E559 Vitamin D deficiency, unspecified: Secondary | ICD-10-CM

## 2015-11-30 DIAGNOSIS — IMO0001 Reserved for inherently not codable concepts without codable children: Secondary | ICD-10-CM

## 2015-11-30 DIAGNOSIS — E349 Endocrine disorder, unspecified: Secondary | ICD-10-CM

## 2015-11-30 DIAGNOSIS — Z87891 Personal history of nicotine dependence: Secondary | ICD-10-CM | POA: Diagnosis not present

## 2015-11-30 DIAGNOSIS — Z79899 Other long term (current) drug therapy: Secondary | ICD-10-CM

## 2015-11-30 DIAGNOSIS — E039 Hypothyroidism, unspecified: Secondary | ICD-10-CM | POA: Diagnosis not present

## 2015-11-30 DIAGNOSIS — F325 Major depressive disorder, single episode, in full remission: Secondary | ICD-10-CM

## 2015-11-30 LAB — CBC WITH DIFFERENTIAL/PLATELET
Basophils Absolute: 42 cells/uL (ref 0–200)
Basophils Relative: 1 %
EOS ABS: 210 {cells}/uL (ref 15–500)
Eosinophils Relative: 5 %
HCT: 49.7 % (ref 38.5–50.0)
HEMOGLOBIN: 16.8 g/dL (ref 13.2–17.1)
LYMPHS PCT: 27 %
Lymphs Abs: 1134 cells/uL (ref 850–3900)
MCH: 30.4 pg (ref 27.0–33.0)
MCHC: 33.8 g/dL (ref 32.0–36.0)
MCV: 90 fL (ref 80.0–100.0)
MONOS PCT: 12 %
MPV: 10.5 fL (ref 7.5–12.5)
Monocytes Absolute: 504 cells/uL (ref 200–950)
NEUTROS PCT: 55 %
Neutro Abs: 2310 cells/uL (ref 1500–7800)
Platelets: 227 10*3/uL (ref 140–400)
RBC: 5.52 MIL/uL (ref 4.20–5.80)
RDW: 14.5 % (ref 11.0–15.0)
WBC: 4.2 10*3/uL (ref 3.8–10.8)

## 2015-11-30 LAB — HEMOGLOBIN A1C
HEMOGLOBIN A1C: 5.8 % — AB (ref <5.7)
MEAN PLASMA GLUCOSE: 120 mg/dL

## 2015-11-30 NOTE — Patient Instructions (Addendum)
Cut lopressor in half twice a day due to low heart rate keep an eye on your blood pressure Monitor your blood pressure at home. Go to the ER if any CP, SOB, nausea, dizziness, severe HA, changes vision/speech  Goal BP:  For patients younger than 60: Goal BP < 140/90. For patients 60 and older: Goal BP < 150/90. For patients with diabetes: Goal BP < 140/90. Your most recent BP: BP: 132/80 mmHg   Take your medications faithfully as instructed. Maintain a healthy weight. Get at least 150 minutes of aerobic exercise per week. Minimize salt intake. Minimize alcohol intake  DASH Eating Plan DASH stands for "Dietary Approaches to Stop Hypertension." The DASH eating plan is a healthy eating plan that has been shown to reduce high blood pressure (hypertension). Additional health benefits may include reducing the risk of type 2 diabetes mellitus, heart disease, and stroke. The DASH eating plan may also help with weight loss. WHAT DO I NEED TO KNOW ABOUT THE DASH EATING PLAN? For the DASH eating plan, you will follow these general guidelines:  Choose foods with a percent daily value for sodium of less than 5% (as listed on the food label).  Use salt-free seasonings or herbs instead of table salt or sea salt.  Check with your health care provider or pharmacist before using salt substitutes.  Eat lower-sodium products, often labeled as "lower sodium" or "no salt added."  Eat fresh foods.  Eat more vegetables, fruits, and low-fat dairy products.  Choose whole grains. Look for the word "whole" as the first word in the ingredient list.  Choose fish and skinless chicken or [place] more often than red meat. Limit fish, poultry, and meat to 6 oz (170 g) each day.  Limit sweets, desserts, sugars, and sugary drinks.  Choose heart-healthy fats.  Limit cheese to 1 oz (28 g) per day.  Eat more home-cooked food and less restaurant, buffet, and fast food.  Limit fried foods.  Cook foods using  methods other than frying.  Limit canned vegetables. If you do use them, rinse them well to decrease the sodium.  When eating at a restaurant, ask that your food be prepared with less salt, or no salt if possible. WHAT FOODS CAN I EAT? Seek help from a dietitian for individual calorie needs. Grains Whole grain or whole wheat bread. Brown rice. Whole grain or whole wheat pasta. Quinoa, bulgur, and whole grain cereals. Low-sodium cereals. Corn or whole wheat flour tortillas. Whole grain cornbread. Whole grain crackers. Low-sodium crackers. Vegetables Fresh or frozen vegetables (raw, steamed, roasted, or grilled). Low-sodium or reduced-sodium tomato and vegetable juices. Low-sodium or reduced-sodium tomato sauce and paste. Low-sodium or reduced-sodium canned vegetables.  Fruits All fresh, canned (in natural juice), or frozen fruits. Meat and Other Protein Products Ground beef (85% or leaner), grass-fed beef, or beef trimmed of fat. Skinless chicken or [place]. Ground chicken or [place]. Pork trimmed of fat. All fish and seafood. Eggs. Dried beans, peas, or lentils. Unsalted nuts and seeds. Unsalted canned beans. Dairy Low-fat dairy products, such as skim or 1% milk, 2% or reduced-fat cheeses, low-fat ricotta or cottage cheese, or plain low-fat yogurt. Low-sodium or reduced-sodium cheeses. Fats and Oils Tub margarines without trans fats. Light or reduced-fat mayonnaise and salad dressings (reduced sodium). Avocado. Safflower, olive, or canola oils. Natural peanut or almond butter. Other Unsalted popcorn and pretzels. The items listed above may not be a complete list of recommended foods or beverages. Contact your dietitian for more options. WHAT  FOODS ARE NOT RECOMMENDED? Grains White bread. White pasta. White rice. Refined cornbread. Bagels and croissants. Crackers that contain trans fat. Vegetables Creamed or fried vegetables. Vegetables in a cheese sauce. Regular canned vegetables. Regular  canned tomato sauce and paste. Regular tomato and vegetable juices. Fruits Dried fruits. Canned fruit in light or heavy syrup. Fruit juice. Meat and Other Protein Products Fatty cuts of meat. Ribs, chicken wings, bacon, sausage, bologna, salami, chitterlings, fatback, hot dogs, bratwurst, and packaged luncheon meats. Salted nuts and seeds. Canned beans with salt. Dairy Whole or 2% milk, cream, half-and-half, and cream cheese. Whole-fat or sweetened yogurt. Full-fat cheeses or blue cheese. Nondairy creamers and whipped toppings. Processed cheese, cheese spreads, or cheese curds. Condiments Onion and garlic salt, seasoned salt, table salt, and sea salt. Canned and packaged gravies. Worcestershire sauce. Tartar sauce. Barbecue sauce. Teriyaki sauce. Soy sauce, including reduced sodium. Steak sauce. Fish sauce. Oyster sauce. Cocktail sauce. Horseradish. Ketchup and mustard. Meat flavorings and tenderizers. Bouillon cubes. Hot sauce. Tabasco sauce. Marinades. Taco seasonings. Relishes. Fats and Oils Butter, stick margarine, lard, shortening, ghee, and bacon fat. Coconut, palm kernel, or palm oils. Regular salad dressings. Other Pickles and olives. Salted popcorn and pretzels. The items listed above may not be a complete list of foods and beverages to avoid. Contact your dietitian for more information. WHERE CAN I FIND MORE INFORMATION? National Heart, Lung, and Blood Institute: travelstabloid.com Document Released: 05/02/2011 Document Revised: 09/27/2013 Document Reviewed: 03/17/2013 [facility] Patient Information 2015 [place], [place]. This information is not intended to replace advice given to you by your health care provider. Make sure you discuss any questions you have with your health care provider.  We want weight loss that will last so you should lose 1-2 pounds a week.  THAT IS IT! Please pick THREE things a month to change. Once it is a habit check off the item.  Then pick another three items off the list to become habits.  If you are already doing a habit on the list GREAT!  Cross that item off! o Don't drink your calories. Ie, alcohol, soda, fruit juice, and sweet tea.  o Drink more water. Drink a glass when you feel hungry or before each meal.  o Eat breakfast - Complex carb and protein (likeDannon light and fit yogurt, oatmeal, fruit, eggs, [place] bacon). o Measure your cereal.  Eat no more than one cup a day. (ie [place]) o Eat an apple a day. o Add a vegetable a day. o Try a new vegetable a month. o Use Pam! Stop using oil or butter to cook. o Don't finish your plate or use smaller plates. o Share your dessert. o Eat sugar free Jello for dessert or frozen grapes. o Don't eat 2-3 hours before bed. o Switch to whole wheat bread, pasta, and brown rice. o Make healthier choices when you eat out. No fries! o Pick baked chicken, NOT fried. o Don't forget to SLOW DOWN when you eat. It is not going anywhere.  o Take the stairs. o Park far away in the parking lot o [employer] (or weights) for 10 minutes while watching TV. o Walk at work for 10 minutes during break. o Walk outside 1 time a week with your friend, kids, dog, or significant other. o Start a walking group at [place] the mall as much as you can tolerate.  o Keep a food diary. o Weigh yourself daily. o Walk for 15 minutes 3 days per week. o Lacinda Axon  at home more often and eat out less.  If life happens and you go back to old habits, it is okay.  Just start over. You can do it!   If you experience chest pain, get short of breath, or tired during the exercise, please stop immediately and inform your doctor.

## 2015-11-30 NOTE — Progress Notes (Signed)
Complete Physical  Assessment and Plan: Essential hypertension - continue medications, DASH diet, exercise and monitor at home. Call if greater than 130/80.  - CBC with Differential/Platelet - BASIC METABOLIC PANEL WITH GFR - Hepatic function panel - Urinalysis, Routine w reflex microscopic - Microalbumin / creatinine urine ratio - EKG 12-Lead   Hypothyroidism, unspecified hypothyroidism type Hypothyroidism-check TSH level, continue medications the same, reminded to take on an empty stomach 30-63mins before food.  - TSH  Prediabetes Discussed general issues about diabetes pathophysiology and management., Educational material distributed., Suggested low cholesterol diet., Encouraged aerobic exercise., Discussed foot care., Reminded to get yearly retinal exam. - Hemoglobin A1c - Insulin, fasting   Hyperlipidemia -continue medications, check lipids, decrease fatty foods, increase activity.  - Lipid panel  Morbid Obesity Obesity with co morbidities- long discussion about weight loss, diet, and exercise   Vitamin D deficiency - Vit D  25 hydroxy (rtn osteoporosis monitoring)  Testosterone deficiency - Testosterone  Medication management - Magnesium  Left foot drop Monitor, follow up ortho  myositis - CK  Obstructive sleep apnea Continue CPAP  Erectile dysfunction, unspecified erectile dysfunction type Continue testosterone and cialis PRN   Major depression in remission Continue zoloft   History of elevated PSA - PSA   Discussed med's effects and SE's. Screening labs and tests as requested with regular follow-up as recommended. Over 40 minutes of exam, counseling, chart review and critical decision making was performed Future Appointments Date Time Provider [place]  02/12/2016 9:30 AM Vicie Mutters, PA-C [facility] None  04/15/2016 3:45 PM Deneise Lever, MD [facility] None  12/03/2016 9:00 AM Vicie Mutters, PA-C [facility] None    HPI Patient  presents for a complete physical.   His blood pressure has been controlled at home, today their BP is BP: 132/80 mmHg  He has a history of LVH with normal EF, last echo 2011, and normal cardiolite 2014 and follows with Dr. Johnsie Cancel but does not care to follow with him again. He is on ASA, BB, ARB, and fluid pill. He does not workout, but with new medicare supplement he will get membership to the [facility] so he is excited about that. He denies chest pain, shortness of breath, dizziness.  He is planning on retiring in 56 days when he goes on medicare.  He is on cholesterol medication and denies myalgias, had intolerant to statins due to elevated CPK but has been tolerating Lipitor and zetia. His cholesterol is at goal. The cholesterol last visit was:   Lab Results  Component Value Date   CHOL 177 10/17/2015   HDL 36* 10/17/2015   LDLCALC 100 10/17/2015   TRIG 203* 10/17/2015   CHOLHDL 4.9 10/17/2015  He has been working on diet and exercise for prediabetes,  and denies paresthesia of the feet, polydipsia, polyuria and visual disturbances. Last A1C in the office was:  Lab Results  Component Value Date   HGBA1C 6.0* 10/17/2015  Patient is on Vitamin D supplement.   Lab Results  Component Value Date   VD25OH 13 10/17/2015  He is on zoloft for depression, he increased it to a full pill which has helped.  He is on thyroid medication. His medication was not changed last visit.   Lab Results  Component Value Date   TSH 1.61 10/17/2015   Last PSA was, follows with Dr. Suann Larry PRN, has had elevated PSA while on testosterone, he is back on it due to increase of energy while on it we will monitor closely his  PSA. Lab Results  Component Value Date   PSA 0.63 09/12/2014  BMI is Body mass index is 36.06 kg/(m^2)., he is working on diet and exercise, he has sleep apnea with CPAP and hypogonadism consequence of his obesity. Wt Readings from Last 3 Encounters:  11/30/15 233 lb 12.8 oz (106.051 kg)  10/17/15  232 lb 9.6 oz (105.507 kg)  07/06/15 234 lb (106.142 kg)    Current Medications:  Current Outpatient Prescriptions on File Prior to Visit  Medication Sig Dispense Refill  . Ascorbic Acid (VITAMIN C) 1000 MG tablet Take 1,000 mg by mouth daily.     Marland Kitchen aspirin 325 MG tablet Take 325 mg by mouth daily.      . B Complex Vitamins (B COMPLEX PO) Take by mouth. Takes M,W,F    . EPINEPHrine (EPIPEN 2-PAK) 0.3 mg/0.3 mL IJ SOAJ injection Inject 0.3 mLs (0.3 mg total) into the muscle once. 2 Device 0  . fish oil-omega-3 fatty acids 1000 MG capsule Take 2 g by mouth 3 (three) times daily.     . Flaxseed, Linseed, (FLAX SEED OIL) 1000 MG CAPS Take 2,000 mg by mouth daily.    . hydrochlorothiazide (HYDRODIURIL) 25 MG tablet TAKE 1 TABLET EVERY MORNING 90 tablet 1  . levothyroxine (SYNTHROID, LEVOTHROID) 50 MCG tablet TAKE ONE TABLET BY MOUTH ONCE DAILY 90 tablet 1  . MAGNESIUM PO Take 650 mg by mouth daily.     . metoprolol tartrate (LOPRESSOR) 25 MG tablet TAKE 1 TABLET TWICE A DAY 180 tablet 1  . sertraline (ZOLOFT) 100 MG tablet TAKE ONE AND ONE-HALF TO TWO TABLETS DAILY AS DIRECTED FOR MOOD 180 tablet 1  . Syringe/Needle, Disp, (SYRINGE 3CC/21GX1") 21G X 1" 3 ML MISC Use AD with testosterone Rx 12 each 1  . testosterone cypionate (DEPOTESTOSTERONE CYPIONATE) 200 MG/ML injection Inject 2 mLs (400 mg total) into the muscle every 14 (fourteen) days. 10 mL 1  . valsartan (DIOVAN) 320 MG tablet TAKE ONE-HALF (1/2) TO ONE TABLET DAILY 90 tablet 2  . ZETIA 10 MG tablet TAKE 1 TABLET DAILY 90 tablet 3  . fenofibrate (TRICOR) 145 MG tablet Take 1 tablet (145 mg total) by mouth daily. 90 tablet 3   No current facility-administered medications on file prior to visit.   Health Maintenance:  Immunization History  Administered Date(s) Administered  . Influenza Split 03/06/2012, 02/24/2013, 03/18/2014  . Influenza-Unspecified 03/03/2015  . PPD Test 07/26/2013  . Pneumococcal Polysaccharide-23 07/26/2013  .  Pneumococcal-Unspecified 05/04/2004  . Tdap 07/24/2011  . Zoster 12/02/2013   Influenza 2016 TDAP 07/24/11 Pneumovax 2015 Prevnar: due 65 Shingles vaccine: 2015  Colonoscopy 08/03/10 due 2017 EYE Exam WNL 2014 ECHO 08/06/09 Neg cardiolite 2014 CXR 06/30/12 WNL U/S ABD 08/01/11- FATTY LIVER  Patient Care Team: Unk Pinto, MD as PCP - General (Internal Medicine) Rana Snare, MD as Consulting Physician (Urology) Irene Shipper, MD as Consulting Physician (Gastroenterology) Melvyn Novas, MD as Referring Physician (Neurology) Lavonna Monarch, MD as Consulting Physician (Dermatology) Dyke Maes, OD (Optometry) Josue Hector, MD as Consulting Physician (Cardiology)  Medical History:  Past Medical History  Diagnosis Date  . Cardiomegaly   . Mixed hyperlipidemia   . Hypertension   . Hypogonadism male   . Sleep apnea   . Obesity   . Colon polyp   . Gallstones   . Pancreas (digestive gland) works poorly ([place])   . Prediabetes   . Vitamin D deficiency   . Fatty liver disease, nonalcoholic  Allergies Allergies  Allergen Reactions  . Ace Inhibitors     cough  . Bee Venom     SURGICAL HISTORY He  has past surgical history that includes Cholecystectomy (1976); Appendectomy (1964); Ankle fracture surgery (Left, 1986); and Carpal tunnel release (Bilateral, 1996). FAMILY HISTORY His family history includes Diabetes in his father, mother, and sister; Heart disease in his father; Hypertension in his father. SOCIAL HISTORY He  reports that he quit smoking about 41 years ago. His smoking use included Cigarettes. He has a 14 pack-year smoking history. He has never used smokeless tobacco. He reports that he does not drink alcohol or use illicit drugs.  Review of Systems:  Review of Systems  Constitutional: Negative.   HENT: Negative.   Eyes: Negative.   Respiratory: Negative.   Cardiovascular: Negative.   Gastrointestinal: Negative.   Genitourinary: Negative.    Musculoskeletal: Negative.   Skin: Negative.   Neurological: Negative.   Endo/Heme/Allergies: Negative.   Psychiatric/Behavioral: Negative.     Physical Exam: Estimated body mass index is 36.06 kg/(m^2) as calculated from the following:   Height as of this encounter: 5' 7.5" (1.715 m).   Weight as of this encounter: 233 lb 12.8 oz (106.051 kg). BP 132/80 mmHg  Pulse 67  Temp(Src) 97.5 F (36.4 C) (Temporal)  Ht 5' 7.5" (1.715 m)  Wt 233 lb 12.8 oz (106.051 kg)  BMI 36.06 kg/m2  SpO2 98% General Appearance: Well nourished, in no apparent distress.  Eyes: PERRLA, EOMs, conjunctiva no swelling or erythema, normal fundi and vessels.  Sinuses: No Frontal/maxillary tenderness  ENT/Mouth: Ext aud canals clear except right ear with cerumen impaction, normal light reflex with TMs without erythema, bulging. Good dentition. No erythema, swelling, or exudate on post pharynx. Tonsils not swollen or erythematous. Hearing normal.  Neck: Supple, thyroid normal. No bruits  Respiratory: Respiratory effort normal, BS equal bilaterally without rales, rhonchi, wheezing or stridor.  Cardio: RRR without murmurs, rubs or gallops. Brisk peripheral pulses without edema.  Chest: symmetric, with normal excursions and percussion.  Abdomen: Soft, obese, nontender, with large well healing vertical and right horizontal scar no guarding, rebound, hernias, masses, or organomegaly.  Lymphatics: Non tender without lymphadenopathy.  Genitourinary: defer Musculoskeletal: Full ROM all peripheral extremities,5/5 strength, and normal gait.  Skin:   Warm, dry without rashes, lesions, ecchymosis. Neuro: Cranial nerves intact, reflexes decreased bilateral legs, worse left than right with decrease dorsiflexion/plantar on left. Normal muscle tone, no cerebellar symptoms. Sensation intact.  Psych: Awake and oriented X 3, normal affect, Insight and Judgment appropriate.   EKG: WNL, sinus brady, no changes. AORTA SCAN:  declines  Vicie Mutters 9:17 AM [facility]

## 2015-11-30 NOTE — Addendum Note (Signed)
Addended by: Vicie Mutters R on: 11/30/2015 12:44 PM   Modules accepted: Miquel Dunn

## 2015-12-01 LAB — URINALYSIS, ROUTINE W REFLEX MICROSCOPIC
Bilirubin Urine: NEGATIVE
Glucose, UA: NEGATIVE
Hgb urine dipstick: NEGATIVE
KETONES UR: NEGATIVE
LEUKOCYTES UA: NEGATIVE
NITRITE: NEGATIVE
Protein, ur: NEGATIVE
SPECIFIC GRAVITY, URINE: 1.023 (ref 1.001–1.035)
pH: 7.5 (ref 5.0–8.0)

## 2015-12-01 LAB — BASIC METABOLIC PANEL WITH GFR
BUN: 18 mg/dL (ref 7–25)
CHLORIDE: 100 mmol/L (ref 98–110)
CO2: 29 mmol/L (ref 20–31)
CREATININE: 1.27 mg/dL — AB (ref 0.70–1.25)
Calcium: 9.9 mg/dL (ref 8.6–10.3)
GFR, Est African American: 69 mL/min (ref >=60)
GFR, Est Non African American: 59 mL/min — ABNORMAL LOW (ref >=60)
Glucose, Bld: 97 mg/dL (ref 65–99)
Potassium: 4.5 mmol/L (ref 3.5–5.3)
SODIUM: 139 mmol/L (ref 135–146)

## 2015-12-01 LAB — LIPID PANEL
CHOLESTEROL: 177 mg/dL (ref 125–200)
HDL: 39 mg/dL — ABNORMAL LOW (ref >=40)
LDL Cholesterol: 95 mg/dL (ref <130)
Total CHOL/HDL Ratio: 4.5 Ratio (ref <=5.0)
Triglycerides: 215 mg/dL — ABNORMAL HIGH (ref <150)
VLDL: 43 mg/dL — AB (ref <30)

## 2015-12-01 LAB — HEPATIC FUNCTION PANEL
ALT: 71 U/L — AB (ref 9–46)
AST: 63 U/L — AB (ref 10–35)
Albumin: 4.9 g/dL (ref 3.6–5.1)
Alkaline Phosphatase: 36 U/L — ABNORMAL LOW (ref 40–115)
Bilirubin, Direct: 0.1 mg/dL (ref <=0.2)
Indirect Bilirubin: 0.4 mg/dL (ref 0.2–1.2)
TOTAL PROTEIN: 7.4 g/dL (ref 6.1–8.1)
Total Bilirubin: 0.5 mg/dL (ref 0.2–1.2)

## 2015-12-01 LAB — CK: Total CK: 698 U/L — ABNORMAL HIGH (ref 7–232)

## 2015-12-01 LAB — TESTOSTERONE: TESTOSTERONE: 980 ng/dL — AB (ref 250–827)

## 2015-12-01 LAB — INSULIN, FASTING: INSULIN FASTING, SERUM: 9.8 u[IU]/mL (ref 2.0–19.6)

## 2015-12-01 LAB — VITAMIN D 25 HYDROXY (VIT D DEFICIENCY, FRACTURES): Vit D, 25-Hydroxy: 68 ng/mL (ref 30–100)

## 2015-12-01 LAB — TSH: TSH: 1.75 m[IU]/L (ref 0.40–4.50)

## 2015-12-01 LAB — MICROALBUMIN / CREATININE URINE RATIO
CREATININE, URINE: 173 mg/dL (ref 20–370)
MICROALB UR: 1.1 mg/dL
MICROALB/CREAT RATIO: 6 ug/mg{creat} (ref <30)

## 2015-12-01 LAB — URIC ACID: Uric Acid, Serum: 5.5 mg/dL (ref 4.0–8.0)

## 2015-12-01 LAB — SEDIMENTATION RATE: Sed Rate: 1 mm/hr (ref 0–20)

## 2015-12-01 LAB — PSA: PSA: 0.71 ng/mL (ref <=4.00)

## 2015-12-01 LAB — MAGNESIUM: Magnesium: 2.1 mg/dL (ref 1.5–2.5)

## 2016-01-07 ENCOUNTER — Other Ambulatory Visit: Payer: Self-pay | Admitting: Internal Medicine

## 2016-01-08 ENCOUNTER — Other Ambulatory Visit: Payer: Self-pay | Admitting: *Deleted

## 2016-01-08 MED ORDER — LEVOTHYROXINE SODIUM 50 MCG PO TABS: 50.0000 ug | ORAL_TABLET | Freq: Every day | ORAL | 1 refills | Status: DC

## 2016-02-12 ENCOUNTER — Encounter: Payer: Self-pay | Admitting: Physician Assistant

## 2016-02-12 ENCOUNTER — Ambulatory Visit (INDEPENDENT_AMBULATORY_CARE_PROVIDER_SITE_OTHER): Payer: PPO | Admitting: Physician Assistant

## 2016-02-12 VITALS — BP 122/66 | HR 70 | Temp 97.3°F | Resp 16 | Ht 67.5 in | Wt 232.8 lb

## 2016-02-12 DIAGNOSIS — C4442 Squamous cell carcinoma of skin of scalp and neck: Secondary | ICD-10-CM | POA: Diagnosis not present

## 2016-02-12 DIAGNOSIS — Z0001 Encounter for general adult medical examination with abnormal findings: Secondary | ICD-10-CM | POA: Diagnosis not present

## 2016-02-12 DIAGNOSIS — E559 Vitamin D deficiency, unspecified: Secondary | ICD-10-CM

## 2016-02-12 DIAGNOSIS — G4733 Obstructive sleep apnea (adult) (pediatric): Secondary | ICD-10-CM | POA: Diagnosis not present

## 2016-02-12 DIAGNOSIS — E291 Testicular hypofunction: Secondary | ICD-10-CM | POA: Diagnosis not present

## 2016-02-12 DIAGNOSIS — R7309 Other abnormal glucose: Secondary | ICD-10-CM

## 2016-02-12 DIAGNOSIS — E039 Hypothyroidism, unspecified: Secondary | ICD-10-CM

## 2016-02-12 DIAGNOSIS — N529 Male erectile dysfunction, unspecified: Secondary | ICD-10-CM | POA: Diagnosis not present

## 2016-02-12 DIAGNOSIS — M791 Myalgia: Secondary | ICD-10-CM | POA: Diagnosis not present

## 2016-02-12 DIAGNOSIS — E349 Endocrine disorder, unspecified: Secondary | ICD-10-CM

## 2016-02-12 DIAGNOSIS — M21372 Foot drop, left foot: Secondary | ICD-10-CM | POA: Diagnosis not present

## 2016-02-12 DIAGNOSIS — Z79899 Other long term (current) drug therapy: Secondary | ICD-10-CM

## 2016-02-12 DIAGNOSIS — R6889 Other general symptoms and signs: Secondary | ICD-10-CM | POA: Diagnosis not present

## 2016-02-12 DIAGNOSIS — I10 Essential (primary) hypertension: Secondary | ICD-10-CM

## 2016-02-12 DIAGNOSIS — Z136 Encounter for screening for cardiovascular disorders: Secondary | ICD-10-CM | POA: Diagnosis not present

## 2016-02-12 DIAGNOSIS — E782 Mixed hyperlipidemia: Secondary | ICD-10-CM | POA: Diagnosis not present

## 2016-02-12 DIAGNOSIS — Z23 Encounter for immunization: Secondary | ICD-10-CM

## 2016-02-12 DIAGNOSIS — IMO0001 Reserved for inherently not codable concepts without codable children: Secondary | ICD-10-CM

## 2016-02-12 DIAGNOSIS — F325 Major depressive disorder, single episode, in full remission: Secondary | ICD-10-CM

## 2016-02-12 DIAGNOSIS — Z0000 Encounter for general adult medical examination without abnormal findings: Secondary | ICD-10-CM

## 2016-02-12 DIAGNOSIS — M609 Myositis, unspecified: Secondary | ICD-10-CM

## 2016-02-12 NOTE — Patient Instructions (Signed)
We want weight loss that will last so you should lose 1-2 pounds a week.  THAT IS IT! Please pick THREE things a month to change. Once it is a habit check off the item. Then pick another three items off the list to become habits.  If you are already doing a habit on the list GREAT!  Cross that item off! o Don't drink your calories. Ie, alcohol, soda, fruit juice, and sweet tea.  o Drink more water. Drink a glass when you feel hungry or before each meal.  o Eat breakfast - Complex carb and protein (likeDannon light and fit yogurt, oatmeal, fruit, eggs, turkey bacon). o Measure your cereal.  Eat no more than one cup a day. (ie Kashi) o Eat an apple a day. o Add a vegetable a day. o Try a new vegetable a month. o Use Pam! Stop using oil or butter to cook. o Don't finish your plate or use smaller plates. o Share your dessert. o Eat sugar free Jello for dessert or frozen grapes. o Don't eat 2-3 hours before bed. o Switch to whole wheat bread, pasta, and brown rice. o Make healthier choices when you eat out. No fries! o Pick baked chicken, NOT fried. o Don't forget to SLOW DOWN when you eat. It is not going anywhere.  o Take the stairs. o Park far away in the parking lot o Lift soup cans (or weights) for 10 minutes while watching TV. o Walk at work for 10 minutes during break. o Walk outside 1 time a week with your friend, kids, dog, or significant other. o Start a walking group at church. o Walk the mall as much as you can tolerate.  o Keep a food diary. o Weigh yourself daily. o Walk for 15 minutes 3 days per week. o Cook at home more often and eat out less.  If life happens and you go back to old habits, it is okay.  Just start over. You can do it!   If you experience chest pain, get short of breath, or tired during the exercise, please stop immediately and inform your doctor.      Bad carbs also include fruit juice, alcohol, and sweet tea. These are empty calories that do not signal  to your brain that you are full.   Please remember the good carbs are still carbs which convert into sugar. So please measure them out no more than 1/2-1 cup of rice, oatmeal, pasta, and beans  Veggies are however free foods! Pile them on.   Not all fruit is created equal. Please see the list below, the fruit at the bottom is higher in sugars than the fruit at the top. Please avoid all dried fruits.     

## 2016-02-12 NOTE — Progress Notes (Signed)
WELCOME TO MEDICARE ANNUAL WELLNESS VISIT   Assessment:   1. Needs flu shot - Flu vaccine HIGH DOSE PF  2. Essential hypertension - continue medications, DASH diet, exercise and monitor at home. Call if greater than 130/80.  - EKG 12-Lead  3. Hypothyroidism, unspecified hypothyroidism type Hypothyroidism-check TSH level, continue medications the same, reminded to take on an empty stomach 30-29mins before food.   4. Morbid obesity, unspecified obesity type ([place]) Obesity with co morbidities- long discussion about weight loss, diet, and exercise  5. Major depression in remission ([place]) Depression- continue medications, stress management techniques discussed, increase water, good sleep hygiene discussed, increase exercise, and increase veggies.   6. Hyperlipidemia -continue medications, check lipids, decrease fatty foods, increase activity.   7. Medication management  8. Vitamin D deficiency  9. Other abnormal glucose Discussed general issues about diabetes pathophysiology and management., Educational material distributed., Suggested low cholesterol diet., Encouraged aerobic exercise., Discussed foot care., Reminded to get yearly retinal exam.  10. Testosterone deficiency Hypogonadism- continue replacement therapy, check testosterone levels as needed.   11. Obstructive sleep apnea Continue CPAP, weight loss advised  12. myositis Monitor symptoms  13. Erectile dysfunction, unspecified erectile dysfunction type Med PRN, weight loss adivsed  14. Left foot drop monitor  15. Squamous cell cancer of skin of crown 2 areas on scalp very suspicious for malignancy, please evaluate and treat if needed.  - Ambulatory referral to Dermatology  Over 40 minutes of exam, counseling, chart review and critical decision making was performed  Future Appointments Date Time Provider [place]  04/15/2016 3:45 PM Deneise Lever, MD [facility] None  12/03/2016 9:00 AM Vicie Mutters, PA-C [facility] None     Plan:   During the course of the visit the patient was educated and counseled about appropriate screening and preventive services including:    Pneumococcal vaccine  Prevnar 13   Influenza vaccine  Td vaccine  Screening electrocardiogram  Bone densitometry screening  Colorectal cancer screening  Diabetes screening  Glaucoma screening  Nutrition counseling   Advanced directives: requested    Subjective:  [name] [name] is a 65 y.o. male who presents for [facility] Visit and 4 month follow.   His blood pressure has been controlled at home, today their BP is BP: 122/66  He is now retired x 2 weeks, walking daily.   He does workout. He denies chest pain, shortness of breath, dizziness.  He is on cholesterol medication and denies myalgias, he is not on statin due to history of myostis. His cholesterol is at goal. The cholesterol last visit was:   Lab Results  Component Value Date   CHOL 177 11/30/2015   HDL 39 (L) 11/30/2015   LDLCALC 95 11/30/2015   TRIG 215 (H) 11/30/2015   CHOLHDL 4.5 11/30/2015    He has been working on diet and exercise for prediabetes, and denies paresthesia of the feet, polydipsia, polyuria and visual disturbances. Last A1C in the office was:  Lab Results  Component Value Date   HGBA1C 5.8 (H) 11/30/2015   Patient is on Vitamin D supplement.   Lab Results  Component Value Date   VD25OH 68 11/30/2015     Last GFR: Lab Results  Component Value Date   GFRNONAA 59 (L) 11/30/2015   BMI is Body mass index is 35.92 kg/m., he is working on diet and exercise. He has OSA, and on CPAP.  Wt Readings from Last 3 Encounters:  02/12/16 232 lb 12.8  oz (105.6 kg)  11/30/15 233 lb 12.8 oz (106.1 kg)  10/17/15 232 lb 9.6 oz (105.5 kg)   He is on thyroid medication. His medication was not changed last visit.   Lab Results  Component Value Date   TSH 1.75 11/30/2015  .   Medication  Review: Current Outpatient Prescriptions on File Prior to Visit  Medication Sig Dispense Refill  . Ascorbic Acid (VITAMIN C) 1000 MG tablet Take 1,000 mg by mouth daily.     Marland Kitchen aspirin 325 MG tablet Take 325 mg by mouth daily.      . B Complex Vitamins (B COMPLEX PO) Take by mouth. Takes M,W,F    . b complex vitamins capsule Take by mouth.    . Cholecalciferol (VITAMIN D-1000 MAX ST) 1000 units tablet Take by mouth.    . EPINEPHrine (EPIPEN 2-PAK) 0.3 mg/0.3 mL IJ SOAJ injection Inject 0.3 mLs (0.3 mg total) into the muscle once. 2 Device 0  . fish oil-omega-3 fatty acids 1000 MG capsule Take 2 g by mouth 3 (three) times daily.     . Flaxseed, Linseed, (FLAX SEED OIL) 1000 MG CAPS Take 2,000 mg by mouth daily.    . hydrochlorothiazide (HYDRODIURIL) 25 MG tablet TAKE 1 TABLET EVERY MORNING 90 tablet 1  . levothyroxine (SYNTHROID, LEVOTHROID) 50 MCG tablet Take 1 tablet (50 mcg total) by mouth daily. 90 tablet 1  . MAGNESIUM PO Take 650 mg by mouth daily.     . metoprolol tartrate (LOPRESSOR) 25 MG tablet TAKE 1 TABLET TWICE A DAY 180 tablet 1  . Multiple Vitamin (MULTIVITAMIN) capsule Take by mouth.    . sertraline (ZOLOFT) 100 MG tablet TAKE ONE AND ONE-HALF TO TWO TABLETS DAILY AS DIRECTED FOR MOOD 180 tablet 1  . Syringe/Needle, Disp, (SYRINGE 3CC/21GX1") 21G X 1" 3 ML MISC Use AD with testosterone Rx 12 each 1  . testosterone cypionate (DEPOTESTOSTERONE CYPIONATE) 200 MG/ML injection Inject 2 mLs (400 mg total) into the muscle every 14 (fourteen) days. 10 mL 1  . valsartan (DIOVAN) 320 MG tablet TAKE ONE-HALF (1/2) TO ONE TABLET DAILY 90 tablet 2  . ZETIA 10 MG tablet TAKE 1 TABLET DAILY 90 tablet 3  . fenofibrate (TRICOR) 145 MG tablet Take 1 tablet (145 mg total) by mouth daily. 90 tablet 3   No current facility-administered medications on file prior to visit.     Allergies: Allergies  Allergen Reactions  . Ace Inhibitors     cough  . Bee Venom     Current Problems  (verified) Patient Active Problem List   Diagnosis Date Noted  . Erectile dysfunction 09/12/2014  . Major depression in remission ([place]) 09/12/2014  . myositis 12/31/2013  . Medication management 12/31/2013  . Hypothyroidism 12/31/2013  . Left foot drop 08/30/2013  . Prediabetes   . Vitamin D deficiency   . Hyperlipidemia 08/04/2009  . Testosterone deficiency 08/03/2009  . Obesity 08/03/2009  . Essential hypertension 08/01/2009  . Obstructive sleep apnea 08/01/2009    Screening Tests Immunization History  Administered Date(s) Administered  . Influenza Split 03/06/2012, 02/24/2013, 03/18/2014  . Influenza, High Dose Seasonal PF 02/12/2016  . Influenza-Unspecified 03/03/2015  . PPD Test 07/26/2013  . Pneumococcal Polysaccharide-23 07/26/2013  . Pneumococcal-Unspecified 05/04/2004  . Tdap 07/24/2011  . Zoster 12/02/2013   Preventative care: Last colonoscopy: 07/2010 CXR 2017 CT head 2012 CT AB/Pelvis 2011 Echo 2011 Stress test 2014   Prior vaccinations: TD or Tdap: 2013  Influenza: TODAY Pneumococcal: 2015 Prevnar13:  Shingles/Zostavax:  2015  Names of Other Physician/Practitioners you currently use: 1. [facility] here for primary care 2. Aliene Altes, eye doctor, last visit July 2017 3. Lendell Caprice, dentist, last visit July 2017 Patient Care Team: Lucky Cowboy, MD as PCP - General (Internal Medicine) Barron Alvine, MD as Consulting Physician (Urology) Hilarie Fredrickson, MD as Consulting Physician (Gastroenterology) Tanna Furry, MD as Referring Physician (Neurology) Janalyn Harder, MD as Consulting Physician (Dermatology) Elliot Cousin, OD (Optometry) Wendall Stade, MD as Consulting Physician (Cardiology)  Surgical: Past Surgical History:  Procedure Laterality Date  . ANKLE FRACTURE SURGERY Left 1986  . APPENDECTOMY  1964  . CARPAL TUNNEL RELEASE Bilateral 1996  . CHOLECYSTECTOMY  1976   Family His family history  includes Diabetes in his father, mother, and sister; Heart disease in his father; Hypertension in his father. Social history  He reports that he quit smoking about 41 years ago. His smoking use included Cigarettes. He has a 14.00 pack-year smoking history. He has never used smokeless tobacco. He reports that he does not drink alcohol or use drugs.  MEDICARE WELLNESS OBJECTIVES: Tobacco use: He does not smoke.  Patient is a former smoker. If yes, counseling given Alcohol Current alcohol use: none Diet: in general, a "healthy" diet   Physical activity: Current Exercise Habits: Home exercise routine, Type of exercise: walking, Time (Minutes): 30, Frequency (Times/Week): 5, Weekly Exercise (Minutes/Week): 150, Intensity: Mild Cardiac risk factors: Cardiac Risk Factors include: advanced age (>78men, >48 women);dyslipidemia;hypertension;male gender;sedentary lifestyle;obesity (BMI >30kg/m2) Depression/mood screen:   Depression screen [facility] 2/9 02/12/2016  Decreased Interest 0  Down, Depressed, Hopeless 0  PHQ - 2 Score 0    ADLs:  In your present state of health, do you have any difficulty performing the following activities: 02/12/2016  Hearing? N  Vision? N  Difficulty concentrating or making decisions? N  Walking or climbing stairs? N  Dressing or bathing? N  Doing errands, shopping? N  Some recent data might be hidden    Cognitive Testing  Alert? Yes  Normal Appearance?Yes  Oriented to person? Yes  Place? Yes   Time? Yes  Recall of three objects?  Yes  Can perform simple calculations? Yes  Displays appropriate judgment?Yes  Can read the correct time from a watch face?Yes  EOL planning: Does patient have an advance directive?: No Would patient like information on creating an advanced directive?: Yes - Educational materials given  Review of Systems  Constitutional: Negative.   HENT: Negative.   Eyes: Negative.   Respiratory: Negative.   Cardiovascular: Negative.    Gastrointestinal: Negative.   Genitourinary: Negative.   Musculoskeletal: Negative.   Skin: Negative.   Neurological: Negative.   Endo/Heme/Allergies: Negative.   Psychiatric/Behavioral: Negative.      Objective:     Today's Vitals   02/12/16 0937  BP: 122/66  Pulse: 70  Resp: 16  Temp: 97.3 F (36.3 C)  SpO2: 98%  Weight: 232 lb 12.8 oz (105.6 kg)  Height: 5' 7.5" (1.715 m)   Body mass index is 35.92 kg/m.  General appearance: alert, no distress, WD/WN, male HEENT: normocephalic, sclerae anicteric, TMs pearly, nares patent, no discharge or erythema, pharynx normal Oral cavity: MMM, no lesions Neck: supple, no lymphadenopathy, no thyromegaly, no masses Heart: RRR, normal S1, S2, no murmurs Lungs: CTA bilaterally, no wheezes, rhonchi, or rales Abdomen: +bs, soft, non tender, non distended, no masses, no hepatomegaly, no splenomegaly Musculoskeletal: nontender, no swelling, no obvious deformity Extremities: no edema, no cyanosis, no  clubbing Pulses: 2+ symmetric, upper and lower extremities, normal cap refill Neurological: alert, oriented x 3, CN2-12 intact, strength normal upper extremities and lower extremities, sensation normal throughout, DTRs 2+ throughout, no cerebellar signs, gait Normal Psychiatric: normal affect, behavior normal, pleasant  Skin: Crown of scalp with 2x2 mm nonhealing ulcer with surrounding erythema, and further down on scalp with larger 4x47mm nonhealing ulcer.   Medicare Attestation I have personally reviewed: The patient's medical and social history Their use of alcohol, tobacco or illicit drugs Their current medications and supplements The patient's functional ability including ADLs,fall risks, home safety risks, cognitive, and hearing and visual impairment Diet and physical activities Evidence for depression or mood disorders  The patient's weight, height, BMI, and visual acuity have been recorded in the chart.  I have made referrals,  counseling, and provided education to the patient based on review of the above and I have provided the patient with a written personalized care plan for preventive services.     Vicie Mutters, PA-C   02/12/2016

## 2016-02-28 ENCOUNTER — Other Ambulatory Visit: Payer: Self-pay | Admitting: *Deleted

## 2016-02-28 MED ORDER — METOPROLOL TARTRATE 25 MG PO TABS: 25.0000 mg | ORAL_TABLET | Freq: Two times a day (BID) | ORAL | 1 refills | Status: DC

## 2016-02-28 MED ORDER — SERTRALINE HCL 100 MG PO TABS: ORAL_TABLET | ORAL | 1 refills | Status: DC

## 2016-03-20 ENCOUNTER — Encounter (HOSPITAL_COMMUNITY): Payer: Self-pay | Admitting: Emergency Medicine

## 2016-03-20 ENCOUNTER — Emergency Department (HOSPITAL_COMMUNITY): Payer: PPO

## 2016-03-20 ENCOUNTER — Inpatient Hospital Stay (HOSPITAL_COMMUNITY)
Admission: EM | Admit: 2016-03-20 | Discharge: 2016-03-23 | DRG: 493 | Disposition: A | Discharge status: Home / Self Care | Payer: PPO | Attending: Internal Medicine | Admitting: Internal Medicine

## 2016-03-20 DIAGNOSIS — N179 Acute kidney failure, unspecified: Secondary | ICD-10-CM | POA: Diagnosis not present

## 2016-03-20 DIAGNOSIS — E039 Hypothyroidism, unspecified: Secondary | ICD-10-CM | POA: Diagnosis present

## 2016-03-20 DIAGNOSIS — S82201A Unspecified fracture of shaft of right tibia, initial encounter for closed fracture: Secondary | ICD-10-CM | POA: Diagnosis not present

## 2016-03-20 DIAGNOSIS — E782 Mixed hyperlipidemia: Secondary | ICD-10-CM | POA: Diagnosis not present

## 2016-03-20 DIAGNOSIS — Z419 Encounter for procedure for purposes other than remedying health state, unspecified: Secondary | ICD-10-CM

## 2016-03-20 DIAGNOSIS — I119 Hypertensive heart disease without heart failure: Secondary | ICD-10-CM | POA: Diagnosis present

## 2016-03-20 DIAGNOSIS — Z01818 Encounter for other preprocedural examination: Secondary | ICD-10-CM | POA: Diagnosis not present

## 2016-03-20 DIAGNOSIS — Z8249 Family history of ischemic heart disease and other diseases of the circulatory system: Secondary | ICD-10-CM

## 2016-03-20 DIAGNOSIS — T148XXA Other injury of unspecified body region, initial encounter: Secondary | ICD-10-CM | POA: Diagnosis not present

## 2016-03-20 DIAGNOSIS — I10 Essential (primary) hypertension: Secondary | ICD-10-CM | POA: Diagnosis not present

## 2016-03-20 DIAGNOSIS — R7303 Prediabetes: Secondary | ICD-10-CM | POA: Diagnosis not present

## 2016-03-20 DIAGNOSIS — Z7982 Long term (current) use of aspirin: Secondary | ICD-10-CM

## 2016-03-20 DIAGNOSIS — S82401A Unspecified fracture of shaft of right fibula, initial encounter for closed fracture: Secondary | ICD-10-CM | POA: Diagnosis not present

## 2016-03-20 DIAGNOSIS — D62 Acute posthemorrhagic anemia: Secondary | ICD-10-CM | POA: Diagnosis not present

## 2016-03-20 DIAGNOSIS — S82209A Unspecified fracture of shaft of unspecified tibia, initial encounter for closed fracture: Secondary | ICD-10-CM

## 2016-03-20 DIAGNOSIS — R509 Fever, unspecified: Secondary | ICD-10-CM | POA: Diagnosis not present

## 2016-03-20 DIAGNOSIS — R262 Difficulty in walking, not elsewhere classified: Secondary | ICD-10-CM

## 2016-03-20 DIAGNOSIS — Z91030 Bee allergy status: Secondary | ICD-10-CM | POA: Diagnosis not present

## 2016-03-20 DIAGNOSIS — S82251A Displaced comminuted fracture of shaft of right tibia, initial encounter for closed fracture: Secondary | ICD-10-CM | POA: Diagnosis not present

## 2016-03-20 DIAGNOSIS — G4733 Obstructive sleep apnea (adult) (pediatric): Secondary | ICD-10-CM | POA: Diagnosis not present

## 2016-03-20 DIAGNOSIS — W1789XA Other fall from one level to another, initial encounter: Secondary | ICD-10-CM | POA: Diagnosis present

## 2016-03-20 DIAGNOSIS — F3341 Major depressive disorder, recurrent, in partial remission: Secondary | ICD-10-CM | POA: Diagnosis present

## 2016-03-20 DIAGNOSIS — K760 Fatty (change of) liver, not elsewhere classified: Secondary | ICD-10-CM | POA: Diagnosis present

## 2016-03-20 DIAGNOSIS — S82451A Displaced comminuted fracture of shaft of right fibula, initial encounter for closed fracture: Secondary | ICD-10-CM | POA: Diagnosis not present

## 2016-03-20 DIAGNOSIS — Z6834 Body mass index (BMI) 34.0-34.9, adult: Secondary | ICD-10-CM | POA: Diagnosis not present

## 2016-03-20 DIAGNOSIS — F325 Major depressive disorder, single episode, in full remission: Secondary | ICD-10-CM | POA: Diagnosis present

## 2016-03-20 DIAGNOSIS — Z87891 Personal history of nicotine dependence: Secondary | ICD-10-CM | POA: Diagnosis not present

## 2016-03-20 DIAGNOSIS — Z888 Allergy status to other drugs, medicaments and biological substances status: Secondary | ICD-10-CM

## 2016-03-20 DIAGNOSIS — E669 Obesity, unspecified: Secondary | ICD-10-CM | POA: Diagnosis not present

## 2016-03-20 DIAGNOSIS — Z79899 Other long term (current) drug therapy: Secondary | ICD-10-CM | POA: Diagnosis not present

## 2016-03-20 DIAGNOSIS — Z9049 Acquired absence of other specified parts of digestive tract: Secondary | ICD-10-CM | POA: Diagnosis not present

## 2016-03-20 DIAGNOSIS — S8991XA Unspecified injury of right lower leg, initial encounter: Secondary | ICD-10-CM | POA: Diagnosis not present

## 2016-03-20 LAB — CBC WITH DIFFERENTIAL/PLATELET
Basophils Absolute: 0 10*3/uL (ref 0.0–0.1)
Basophils Relative: 0 %
Eosinophils Absolute: 0.1 10*3/uL (ref 0.0–0.7)
Eosinophils Relative: 2 %
HCT: 44.6 % (ref 39.0–52.0)
Hemoglobin: 15.2 g/dL (ref 13.0–17.0)
Lymphocytes Relative: 9 %
Lymphs Abs: 0.8 10*3/uL (ref 0.7–4.0)
MCH: 30.8 pg (ref 26.0–34.0)
MCHC: 34.1 g/dL (ref 30.0–36.0)
MCV: 90.5 fL (ref 78.0–100.0)
Monocytes Absolute: 0.8 10*3/uL (ref 0.1–1.0)
Monocytes Relative: 10 %
Neutro Abs: 6.3 10*3/uL (ref 1.7–7.7)
Neutrophils Relative %: 79 %
Platelets: 193 10*3/uL (ref 150–400)
RBC: 4.93 MIL/uL (ref 4.22–5.81)
RDW: 13.7 % (ref 11.5–15.5)
WBC: 8 10*3/uL (ref 4.0–10.5)

## 2016-03-20 LAB — BASIC METABOLIC PANEL
Anion gap: 9 (ref 5–15)
BUN: 25 mg/dL — ABNORMAL HIGH (ref 6–20)
CO2: 26 mmol/L (ref 22–32)
Calcium: 9.8 mg/dL (ref 8.9–10.3)
Chloride: 104 mmol/L (ref 101–111)
Creatinine, Ser: 0.96 mg/dL (ref 0.61–1.24)
GFR calc Af Amer: >60 mL/min (ref >60)
GFR calc non Af Amer: >60 mL/min (ref >60)
Glucose, Bld: 121 mg/dL — ABNORMAL HIGH (ref 65–99)
Potassium: 3.7 mmol/L (ref 3.5–5.1)
Sodium: 139 mmol/L (ref 135–145)

## 2016-03-20 LAB — ABO/RH: ABO/RH(D): A POS

## 2016-03-20 LAB — CREATININE, SERUM
Creatinine, Ser: 1.16 mg/dL (ref 0.61–1.24)
GFR calc Af Amer: >60 mL/min (ref >60)
GFR calc non Af Amer: >60 mL/min (ref >60)

## 2016-03-20 LAB — TYPE AND SCREEN
ABO/RH(D): A POS
Antibody Screen: NEGATIVE

## 2016-03-20 LAB — CBG MONITORING, ED: Glucose-Capillary: 116 mg/dL — ABNORMAL HIGH (ref 65–99)

## 2016-03-20 MED ORDER — METOPROLOL TARTRATE 12.5 MG HALF TABLET
12.5000 mg | ORAL_TABLET | Freq: Two times a day (BID) | ORAL | Status: DC
  Administered 2016-03-20 – 2016-03-23 (×5): 12.5 mg via ORAL
  Filled 2016-03-20 (×5): qty 1

## 2016-03-20 MED ORDER — B COMPLEX-C PO TABS
1.0000 | ORAL_TABLET | Freq: Every day | ORAL | Status: DC
  Administered 2016-03-22 – 2016-03-23 (×2): 1 via ORAL
  Filled 2016-03-20 (×4): qty 1

## 2016-03-20 MED ORDER — MORPHINE SULFATE (PF) 2 MG/ML IV SOLN
4.0000 mg | Freq: Once | INTRAVENOUS | Status: AC
  Administered 2016-03-20: 4 mg via INTRAVENOUS
  Filled 2016-03-20: qty 2

## 2016-03-20 MED ORDER — LEVOTHYROXINE SODIUM 50 MCG PO TABS
50.0000 ug | ORAL_TABLET | Freq: Every day | ORAL | Status: DC
  Administered 2016-03-21 – 2016-03-23 (×3): 50 ug via ORAL
  Filled 2016-03-20 (×3): qty 1

## 2016-03-20 MED ORDER — MORPHINE SULFATE (PF) 2 MG/ML IV SOLN: 0.5000 mg | INTRAVENOUS | Status: DC | PRN

## 2016-03-20 MED ORDER — EZETIMIBE 10 MG PO TABS
10.0000 mg | ORAL_TABLET | Freq: Every day | ORAL | Status: DC
  Administered 2016-03-20 – 2016-03-23 (×3): 10 mg via ORAL
  Filled 2016-03-20 (×3): qty 1

## 2016-03-20 MED ORDER — IRBESARTAN 75 MG PO TABS
37.5000 mg | ORAL_TABLET | Freq: Every day | ORAL | Status: DC
  Filled 2016-03-20: qty 0.5

## 2016-03-20 MED ORDER — FENTANYL CITRATE (PF) 100 MCG/2ML IJ SOLN
100.0000 ug | Freq: Once | INTRAMUSCULAR | Status: AC
  Administered 2016-03-20: 100 ug via INTRAVENOUS
  Filled 2016-03-20: qty 2

## 2016-03-20 MED ORDER — HYDROMORPHONE HCL 1 MG/ML IJ SOLN
1.0000 mg | INTRAMUSCULAR | Status: DC | PRN
  Administered 2016-03-20: 1 mg via INTRAVENOUS
  Filled 2016-03-20: qty 1

## 2016-03-20 MED ORDER — HYDROMORPHONE HCL 2 MG/ML IJ SOLN: 1.0000 mg | INTRAMUSCULAR | Status: DC | PRN

## 2016-03-20 MED ORDER — ONDANSETRON HCL 4 MG/2ML IJ SOLN: 4.0000 mg | Freq: Three times a day (TID) | INTRAMUSCULAR | Status: AC | PRN

## 2016-03-20 MED ORDER — HYDROCHLOROTHIAZIDE 25 MG PO TABS: 25.0000 mg | ORAL_TABLET | Freq: Every morning | ORAL | Status: DC

## 2016-03-20 MED ORDER — OMEGA-3-ACID ETHYL ESTERS 1 G PO CAPS
2.0000 g | ORAL_CAPSULE | Freq: Three times a day (TID) | ORAL | Status: DC
  Administered 2016-03-21 – 2016-03-23 (×6): 2 g via ORAL
  Filled 2016-03-20 (×7): qty 2

## 2016-03-20 MED ORDER — ENOXAPARIN SODIUM 40 MG/0.4ML ~~LOC~~ SOLN
40.0000 mg | SUBCUTANEOUS | Status: DC
  Administered 2016-03-21 – 2016-03-22 (×2): 40 mg via SUBCUTANEOUS
  Filled 2016-03-20 (×2): qty 0.4

## 2016-03-20 MED ORDER — VITAMIN C 500 MG PO TABS
1000.0000 mg | ORAL_TABLET | Freq: Every day | ORAL | Status: DC
  Administered 2016-03-22 – 2016-03-23 (×2): 1000 mg via ORAL
  Filled 2016-03-20 (×3): qty 2

## 2016-03-20 MED ORDER — SERTRALINE HCL 100 MG PO TABS
100.0000 mg | ORAL_TABLET | Freq: Every day | ORAL | Status: DC
  Administered 2016-03-20 – 2016-03-22 (×2): 100 mg via ORAL
  Filled 2016-03-20 (×3): qty 1

## 2016-03-20 MED ORDER — ASPIRIN 325 MG PO TABS
325.0000 mg | ORAL_TABLET | Freq: Every day | ORAL | Status: DC
  Administered 2016-03-22 – 2016-03-23 (×2): 325 mg via ORAL
  Filled 2016-03-20 (×2): qty 1

## 2016-03-20 MED ORDER — ADULT MULTIVITAMIN W/MINERALS CH
1.0000 | ORAL_TABLET | Freq: Every day | ORAL | Status: DC
  Administered 2016-03-22 – 2016-03-23 (×2): 1 via ORAL
  Filled 2016-03-20 (×3): qty 1

## 2016-03-20 MED ORDER — HYDROCODONE-ACETAMINOPHEN 5-325 MG PO TABS
1.0000 | ORAL_TABLET | Freq: Four times a day (QID) | ORAL | Status: DC | PRN
  Administered 2016-03-20: 2 via ORAL
  Filled 2016-03-20: qty 2

## 2016-03-20 MED ORDER — SODIUM CHLORIDE 0.9 % IV SOLN
INTRAVENOUS | Status: DC
  Administered 2016-03-21: via INTRAVENOUS

## 2016-03-20 NOTE — ED Notes (Signed)
Attempted to call report to accepting floor.  Asked to wait 20-25 minutes.

## 2016-03-20 NOTE — Anesthesia Preprocedure Evaluation (Addendum)
Anesthesia Evaluation  Patient identified by MRN, date of birth, ID band Patient awake    Reviewed: Allergy & Precautions, H&P , NPO status , Patient's Chart, lab work & pertinent test results, reviewed documented beta blocker date and time   Airway Mallampati: II  TM Distance: >3 FB Neck ROM: Full    Dental no notable dental hx. (+) Teeth Intact, Dental Advisory Given   Pulmonary sleep apnea and Continuous Positive Airway Pressure Ventilation , former smoker,    Pulmonary exam normal breath sounds clear to auscultation       Cardiovascular hypertension, Pt. on medications and Pt. on home beta blockers  Rhythm:Regular Rate:Normal     Neuro/Psych Depression negative neurological ROS     GI/Hepatic negative GI ROS, Neg liver ROS,   Endo/Other  Hypothyroidism   Renal/GU negative Renal ROS  negative genitourinary   Musculoskeletal   Abdominal   Peds  Hematology negative hematology ROS (+)   Anesthesia Other Findings   Reproductive/Obstetrics negative OB ROS                            Anesthesia Physical Anesthesia Plan  ASA: III  Anesthesia Plan: General   Post-op Pain Management:    Induction: Intravenous  Airway Management Planned: Oral ETT  Additional Equipment:   Intra-op Plan: Utilization Of Total Body Hypothermia per surgeon request  Post-operative Plan: Extubation in OR  Informed Consent: I have reviewed the patients History and Physical, chart, labs and discussed the procedure including the risks, benefits and alternatives for the proposed anesthesia with the patient or authorized representative who has indicated his/her understanding and acceptance.   Dental advisory given  Plan Discussed with: CRNA, Anesthesiologist and Surgeon  Anesthesia Plan Comments:        Anesthesia Quick Evaluation

## 2016-03-20 NOTE — ED Provider Notes (Signed)
[facility] DEPT Provider Note   CSN: [id number] Arrival date & time: 03/20/16  0944     History   Chief Complaint Chief Complaint  Patient presents with  . Leg Injury    HPI [name] [name] is a 65 y.o. male with history of hypertension who presents with right leg pain. Patient reports he was locked in his backyard out of his house and jumped a 4 foot fence and landed on his right leg is. He reports that his leg gave out and he was unable to walk on it. He drove himself to his neighbors for help. He denies any other injuries. He reports some mild pain with movement and palpation, however no significant pain noted. He states his toes feel cold and numb. Patient denies hitting his head or losing consciousness. Patient denies any chest pain, shortness of breath, abdominal pain, nausea, vomiting, urinary symptoms.  HPI  Past Medical History:  Diagnosis Date  . Cardiomegaly   . Colon polyp   . Fatty liver disease, nonalcoholic   . Gallstones   . Hypertension   . Hypogonadism male   . Mixed hyperlipidemia   . Obesity   . Pancreas (digestive gland) works poorly   . Prediabetes   . Sleep apnea   . Vitamin D deficiency     Patient Active Problem List   Diagnosis Date Noted  . Closed fracture of right tibia and fibula 03/20/2016  . Erectile dysfunction 09/12/2014  . Major depression in remission ([place]) 09/12/2014  . myositis 12/31/2013  . Medication management 12/31/2013  . Hypothyroidism 12/31/2013  . Left foot drop 08/30/2013  . Other abnormal glucose   . Vitamin D deficiency   . Hyperlipidemia 08/04/2009  . Testosterone deficiency 08/03/2009  . Morbid obesity ([place]) 08/03/2009  . Essential hypertension 08/01/2009  . Obstructive sleep apnea 08/01/2009    Past Surgical History:  Procedure Laterality Date  . ANKLE FRACTURE SURGERY Left 1986  . APPENDECTOMY  1964  . CARPAL TUNNEL RELEASE Bilateral 1996  . CHOLECYSTECTOMY  1976       Home Medications    Prior  to Admission medications   Medication Sig Start Date End Date Taking? Authorizing Provider  Ascorbic Acid (VITAMIN C) 1000 MG tablet Take 1,000 mg by mouth daily.    Yes Historical Provider, MD  aspirin 325 MG tablet Take 325 mg by mouth daily.     Yes Historical Provider, MD  B Complex Vitamins (B COMPLEX PO) Take 1 tablet by mouth See admin instructions. Takes M,W,F   Yes Historical Provider, MD  Cholecalciferol (VITAMIN D-1000 MAX ST) 1000 units tablet Take by mouth.   Yes Historical Provider, MD  EPINEPHrine (EPIPEN 2-PAK) 0.3 mg/0.3 mL IJ SOAJ injection Inject 0.3 mLs (0.3 mg total) into the muscle once. 03/06/14  Yes Leonard Schwartz, MD  fish oil-omega-3 fatty acids 1000 MG capsule Take 2 g by mouth 3 (three) times daily.    Yes Historical Provider, MD  Flaxseed, Linseed, (FLAX SEED OIL) 1000 MG CAPS Take 2,000 mg by mouth daily.   Yes Historical Provider, MD  hydrochlorothiazide (HYDRODIURIL) 25 MG tablet TAKE 1 TABLET EVERY MORNING Patient taking differently: TAKE 12.5 mg EVERY MORNING 07/16/15  Yes Unk Pinto, MD  levothyroxine (SYNTHROID, LEVOTHROID) 50 MCG tablet Take 1 tablet (50 mcg total) by mouth daily. 01/08/16  Yes Unk Pinto, MD  MAGNESIUM PO Take 650 mg by mouth daily.    Yes Historical Provider, MD  metoprolol tartrate (LOPRESSOR) 25 MG tablet Take 1  tablet (25 mg total) by mouth 2 (two) times daily. Patient taking differently: Take 12.5 mg by mouth 2 (two) times daily.  02/28/16  Yes Unk Pinto, MD  Multiple Vitamin (MULTIVITAMIN) capsule Take by mouth.   Yes Historical Provider, MD  sertraline (ZOLOFT) 100 MG tablet TAKE ONE AND ONE-HALF TO TWO TABLETS DAILY AS DIRECTED FOR MOOD 02/28/16  Yes Unk Pinto, MD  valsartan (DIOVAN) 320 MG tablet TAKE ONE-HALF (1/2) TO ONE TABLET DAILY 11/19/15  Yes Courtney Forcucci, PA-C  ZETIA 10 MG tablet TAKE 1 TABLET DAILY Patient taking differently: TAKE 1 TABLET DAILY EVERY EVENING 03/20/15  Yes Unk Pinto, MD    fenofibrate (TRICOR) 145 MG tablet Take 1 tablet (145 mg total) by mouth daily. Patient not taking: Reported on 03/20/2016 11/14/14 11/14/15  Unk Pinto, MD  Syringe/Needle, Disp, (SYRINGE 3CC/21GX1") 21G X 1" 3 ML MISC Use AD with testosterone Rx 03/01/15   Unk Pinto, MD  testosterone cypionate (DEPOTESTOSTERONE CYPIONATE) 200 MG/ML injection Inject 2 mLs (400 mg total) into the muscle every 14 (fourteen) days. Patient not taking: Reported on 03/20/2016 03/01/15   Unk Pinto, MD    Family History Family History  Problem Relation Age of Onset  . Diabetes Mother   . Diabetes Father     brother and sister  . Heart disease Father   . Hypertension Father   . Diabetes Sister     Social History Social History  Substance Use Topics  . Smoking status: Former Smoker    Packs/day: 2.00    Years: 7.00    Types: Cigarettes    Quit date: 05/27/1974  . Smokeless tobacco: Never Used  . Alcohol use No     Allergies   Ace inhibitors and Bee venom   Review of Systems Review of Systems  Constitutional: Negative for chills and fever.  HENT: Negative for facial swelling and sore throat.   Respiratory: Negative for shortness of breath.   Cardiovascular: Negative for chest pain.  Gastrointestinal: Negative for abdominal pain, nausea and vomiting.  Genitourinary: Negative for dysuria.  Musculoskeletal: Negative for back pain.  Skin: Negative for rash and wound.  Neurological: Negative for headaches.  Psychiatric/Behavioral: The patient is not nervous/anxious.      Physical Exam Updated Vital Signs BP 94/64 (BP Location: Right Arm)   Pulse (!) 58   Temp 98.7 F (37.1 C) (Oral)   Resp 16   Ht 5\' 8"  (1.727 m)   Wt 104.3 kg   SpO2 99%   BMI 34.97 kg/m   Physical Exam  Constitutional: He appears well-developed and well-nourished. No distress.  HENT:  Head: Normocephalic and atraumatic.  Mouth/Throat: Oropharynx is clear and moist. No oropharyngeal exudate.  Eyes:  Conjunctivae are normal. Pupils are equal, round, and reactive to light. Right eye exhibits no discharge. Left eye exhibits no discharge. No scleral icterus.  Neck: Normal range of motion. Neck supple. No thyromegaly present.  Cardiovascular: Normal rate, regular rhythm, normal heart sounds and intact distal pulses.  Exam reveals no gallop and no friction rub.   No murmur heard. Pulmonary/Chest: Effort normal and breath sounds normal. No stridor. No respiratory distress. He has no wheezes. He has no rales.  Abdominal: Soft. Bowel sounds are normal. He exhibits no distension. There is no tenderness. There is no rebound and no guarding.  Musculoskeletal: He exhibits no edema.       Right lower leg: He exhibits tenderness, bony tenderness, swelling and deformity.       Legs: R  LE: Normal sensation; DP and PT pulses intact; cap refill <2 secs  Lymphadenopathy:    He has no cervical adenopathy.  Neurological: He is alert. Coordination normal.  Skin: Skin is warm and dry. No rash noted. He is not diaphoretic. No pallor.  Psychiatric: He has a normal mood and affect.  Nursing note and vitals reviewed.    ED Treatments / Results  Labs (all labs ordered are listed, but only abnormal results are displayed) Labs Reviewed  BASIC METABOLIC PANEL - Abnormal; Notable for the following:       Result Value   Glucose, Bld 121 (*)    BUN 25 (*)    All other components within normal limits  CBC WITH DIFFERENTIAL/PLATELET  TYPE AND SCREEN  ABO/RH    EKG  EKG Interpretation  Date/Time:  Wednesday March 20 2016 10:50:59 EDT Ventricular Rate:  60 PR Interval:    QRS Duration: 104 QT Interval:  432 QTC Calculation: 432 R Axis:   -15 Text Interpretation:  Sinus rhythm Borderline left axis deviation Abnormal R-wave progression, early transition Baseline wander in lead(s) I II III aVR aVL aVF since last tracing no significant change Confirmed by Sabra Heck  MD, BRIAN ([id number]) on 03/20/2016 10:54:46 AM         Radiology Dg Chest 2 View  Result Date: 03/20/2016 CLINICAL DATA:  Medical clearance examination. No current complaints. EXAM: CHEST  2 VIEW COMPARISON:  PA and lateral chest 11/30/2015 and 06/30/2012 FINDINGS: The lungs are clear. Heart size is normal. No pneumothorax or pleural effusion. Thoracic spondylosis is noted. IMPRESSION: No acute disease. Electronically Signed   By: Inge Rise M.D.   On: 03/20/2016 11:16   Dg Tibia/fibula Right  Result Date: 03/20/2016 CLINICAL DATA:  Pt at home this a.m, and jumped from ladder over fence and landed wrong, severe pain, deformity rt lower leg EXAM: RIGHT TIBIA AND FIBULA - 2 VIEW COMPARISON:  None. FINDINGS: There are comminuted displaced fractures of the right tibia and fibula. The fractures extend from the proximal to distal diaphysis of both the tibia and fibula. There are multiple comminuted fracture components. Major tibial fracture components are displaced as much as 13 mm, with the distal primary fracture component displaced laterally. There is displacement of the fibular fracture components of a maximum of 1 full shaft width. There is slight angulation of the fractures in a posterior lateral direction. Diffuse surrounding soft tissue swelling is noted. The knee and ankle joints are normally aligned. IMPRESSION: 1. Comminuted, displaced and mildly angulated fractures of the right tibia and fibula from the mid through the distal shafts. Electronically Signed   By: Lajean Manes M.D.   On: 03/20/2016 10:19    Procedures Procedures (including critical care time)  Medications Ordered in ED Medications  fentaNYL (SUBLIMAZE) injection 100 mcg (100 mcg Intravenous Given 03/20/16 1125)  morphine 2 MG/ML injection 4 mg (4 mg Intravenous Given 03/20/16 1315)     Initial Impression / Assessment and Plan / ED Course  I have reviewed the triage vital signs and the nursing notes.  Pertinent labs & imaging results that were available during  my care of the patient were reviewed by me and considered in my medical decision making (see chart for details).  Clinical Course    Patient with comminuted, displaced and mildly angulated fracture of the right tibia and fibula from the mid through the distal shafts. I spoke with Dr. Percell Miller who would like the patient transferred to [facility] for surgery tomorrow.  Hospitalist, Dr. Waldron Labs, will admit. Patient's pain controlled in the ED with morphine. CBC unremarkable. BMP shows glucose 121, BUN 25. CXR shows no acute disease. Type and screen A+. Patient also evaluated by Dr. Sabra Heck who guided the patient's management and agrees with plan.  Final Clinical Impressions(s) / ED Diagnoses   Final diagnoses:  Closed fracture of right tibia and fibula, initial encounter    New Prescriptions New Prescriptions   No medications on file     Frederica Kuster, PA-C 03/20/16 1441    Noemi Chapel, MD 03/22/16 1043

## 2016-03-20 NOTE — ED Triage Notes (Signed)
Per EMS, pt from home, pt was in his backyard and locked himself in chain link fence so he got a ladder to climb over and in the process landed on his R leg. When he landed, r leg gave out. Denies any other injury, head injury, neck or back pain. Abrasions noted to elbow and forearm after he dragged himself to the driveway to get help. Obvious deformity noted, possible tib/fib fracture. Good PMS. Splinted by EMS. 50 mcg fentanyl given en route.

## 2016-03-20 NOTE — ED Provider Notes (Signed)
The pt is a 65 y/o male, who has a history of hypertension, takes only medications for hypertension, presents to the hospital today after jumping off of a fence trying to get out of his yard as he locked himself out of the house, landed predominantly on his right leg and had acute onset of pain.  On exam the patient has a swollen right lower extremity compartment with deformity, normal pulses at the posterior tibial and dorsalis pedis, normal sensation, normal capillary refill. Otherwise the patient is in no distress and seems to have fairly good pain control. The compartment is tight but not tense, discussed with orthopedics by physician assistant law, we'll need to transfer to [facility], patient will be given pain medication, x-ray reveals a comminuted tibia and fibula fractures.  Medical screening examination/treatment/procedure(s) were conducted as a shared visit with non-physician practitioner(s) and myself.  I personally evaluated the patient during the encounter.  Clinical Impression:   Final diagnoses:  Closed fracture of right tibia and fibula, initial encounter         Noemi Chapel, MD 03/22/16 1043

## 2016-03-20 NOTE — Progress Notes (Signed)
ED CM noted pt with Right tibia and fibula fractureRight tibia and fibula fracture Pt noted for transfer to [facility]  CM spoke with pt and his wife Pt very pleasant Reports good support system at home to include wife, children and neighbors  Confirms he only has crutches at home Pt choice of Home health and DME provider agency [facility] because he has his CPAP services with them Pt agreed for the [facility] home care staff to be contacted to follow him while at [facility] for any d/c needs Left pt with copy of [facility] contact information  ED CM spoke with Santiago Glad, [place] home care coordinator, [phone]) to follow pt for d/c needs

## 2016-03-20 NOTE — ED Notes (Signed)
Bed: WA03 Expected date:  Expected time:  Means of arrival:  Comments: EMS- fall/leg injury/deformity

## 2016-03-20 NOTE — H&P (Signed)
TRH H&P   Patient Demographics:    [name] [name], is a 65 y.o. male  MRN: [id number]   DOB - 1950/12/12  Admit Date - 03/20/2016  Outpatient Primary MD for the patient is [name] Richards, MD  Referring MD/NP/PA: PA Law   Patient coming from: Home  Chief Complaint  Patient presents with  . Leg Injury      HPI:    [name] [name]  is a 65 y.o. male, With past medical history of hypertension, hyperlipidemia, hypoaldosteronism, hypothyroidism, depression, patient presents secondary to right leg injury, patient reports he locked himself in the backyard, was trying to jump 4 foot fence, he landed on his right leg, reports so than right leg pain upon touching the ground, unable to walk on it, he denies any syncope, presyncope, loss of consciousness, chest pain, shortness of breath. - IN ED workup significant for right tibia/fibula fracture, labs with no abnormalities, chest x-ray with no acute findings, EKG nonacute, discussed with Dr. Percell Miller, he will operate tomorrow morning, like for the  patient to be transferred to [facility].    Review of systems:    In addition to the HPI above,  No Fever-chills, No Headache, No changes with Vision or hearing, No problems swallowing food or Liquids, No Chest pain, Cough or Shortness of Breath, No Abdominal pain, No Nausea or Vommitting, Bowel movements are regular, No Blood in stool or Urine, No dysuria, No new skin rashes or bruises, Complains of right lower extremity pain No new weakness, tingling, numbness in any extremity, No recent weight gain or loss, No polyuria, polydypsia or polyphagia, No significant Mental Stressors.  A full 10 point Review of Systems was done, except as stated above, all other Review of Systems were negative.   With Past History of the following :    Past Medical History:  Diagnosis Date  .  Cardiomegaly   . Colon polyp   . Fatty liver disease, nonalcoholic   . Gallstones   . Hypertension   . Hypogonadism male   . Mixed hyperlipidemia   . Obesity   . Pancreas (digestive gland) works poorly   . Prediabetes   . Sleep apnea   . Vitamin D deficiency       Past Surgical History:  Procedure Laterality Date  . ANKLE FRACTURE SURGERY Left 1986  . APPENDECTOMY  1964  . CARPAL TUNNEL RELEASE Bilateral 1996  . CHOLECYSTECTOMY  1976      Social History:     Social History  Substance Use Topics  . Smoking status: Former Smoker    Packs/day: 2.00    Years: 7.00    Types: Cigarettes    Quit date: 05/27/1974  . Smokeless tobacco: Never Used  . Alcohol use No     Lives - Home  Mobility - Independent     Family History :     Family History  Problem Relation Age of Onset  . Diabetes Mother   . Diabetes Father     brother and sister  . Heart disease Father   . Hypertension Father   . Diabetes Sister       Home Medications:   Prior to Admission medications   Medication Sig Start Date End Date Taking? Authorizing Provider  Ascorbic Acid (VITAMIN C) 1000 MG tablet Take 1,000 mg by mouth daily.    Yes Historical Provider, MD  aspirin 325 MG tablet Take 325 mg by mouth daily.     Yes Historical Provider, MD  B Complex Vitamins (B COMPLEX PO) Take 1 tablet by mouth See admin instructions. Takes M,W,F   Yes Historical Provider, MD  Cholecalciferol (VITAMIN D-1000 MAX ST) 1000 units tablet Take by mouth.   Yes Historical Provider, MD  EPINEPHrine (EPIPEN 2-PAK) 0.3 mg/0.3 mL IJ SOAJ injection Inject 0.3 mLs (0.3 mg total) into the muscle once. 03/06/14  Yes Leonard Schwartz, MD  fish oil-omega-3 fatty acids 1000 MG capsule Take 2 g by mouth 3 (three) times daily.    Yes Historical Provider, MD  Flaxseed, Linseed, (FLAX SEED OIL) 1000 MG CAPS Take 2,000 mg by mouth daily.   Yes Historical Provider, MD  hydrochlorothiazide (HYDRODIURIL) 25 MG tablet TAKE 1 TABLET EVERY  MORNING Patient taking differently: TAKE 12.5 mg EVERY MORNING 07/16/15  Yes Unk Pinto, MD  levothyroxine (SYNTHROID, LEVOTHROID) 50 MCG tablet Take 1 tablet (50 mcg total) by mouth daily. 01/08/16  Yes Unk Pinto, MD  MAGNESIUM PO Take 650 mg by mouth daily.    Yes Historical Provider, MD  metoprolol tartrate (LOPRESSOR) 25 MG tablet Take 1 tablet (25 mg total) by mouth 2 (two) times daily. Patient taking differently: Take 12.5 mg by mouth 2 (two) times daily.  02/28/16  Yes Unk Pinto, MD  Multiple Vitamin (MULTIVITAMIN) capsule Take by mouth.   Yes Historical Provider, MD  sertraline (ZOLOFT) 100 MG tablet TAKE ONE AND ONE-HALF TO TWO TABLETS DAILY AS DIRECTED FOR MOOD 02/28/16  Yes Unk Pinto, MD  valsartan (DIOVAN) 320 MG tablet TAKE ONE-HALF (1/2) TO ONE TABLET DAILY 11/19/15  Yes Courtney Forcucci, PA-C  ZETIA 10 MG tablet TAKE 1 TABLET DAILY Patient taking differently: TAKE 1 TABLET DAILY EVERY EVENING 03/20/15  Yes Unk Pinto, MD  fenofibrate (TRICOR) 145 MG tablet Take 1 tablet (145 mg total) by mouth daily. Patient not taking: Reported on 03/20/2016 11/14/14 11/14/15  Unk Pinto, MD  Syringe/Needle, Disp, (SYRINGE 3CC/21GX1") 21G X 1" 3 ML MISC Use AD with testosterone Rx 03/01/15   Unk Pinto, MD  testosterone cypionate (DEPOTESTOSTERONE CYPIONATE) 200 MG/ML injection Inject 2 mLs (400 mg total) into the muscle every 14 (fourteen) days. Patient not taking: Reported on 03/20/2016 03/01/15   Unk Pinto, MD     Allergies:     Allergies  Allergen Reactions  . Ace Inhibitors Other (See Comments)    cough  . Bee Venom Other (See Comments)     Physical Exam:   Vitals  Blood pressure 102/76, pulse 60, temperature 98.7 F (37.1 C), temperature source Oral, resp. rate 11, SpO2 95 %.   1. General Well-nourished male lying in bed in NAD,   2. Normal affect and insight, Not Suicidal or Homicidal, Awake Alert, Oriented X 3.  3. No F.N deficits,  ALL C.Nerves Intact, Strength 5/5 all 4 extremities, Sensation intact all 4 extremities, Plantars down going.  4. Ears and Eyes appear Normal, Conjunctivae clear, PERRLA. Moist Oral Mucosa.  5. Supple Neck, No JVD, No cervical lymphadenopathy appriciated, No Carotid Bruits.  6. Symmetrical Chest wall movement, Good air movement bilaterally, CTAB.  7. RRR, No Gallops, Rubs or Murmurs, No Parasternal Heave.  8. Positive Bowel Sounds, Abdomen Soft, No tenderness, No organomegaly appriciated,No rebound -guarding or rigidity.  9.  No Cyanosis, Normal Skin Turgor, No Skin Rash or Bruise.  10. Good muscle tone,  joints appear normal , no effusions, Normal ROM On the left side, right lower extremity in splint.  11. No Palpable Lymph Nodes in Neck or Axillae     Data Review:    CBC  Recent Labs Lab 03/20/16 1055  WBC 8.0  HGB 15.2  HCT 44.6  PLT 193  MCV 90.5  MCH 30.8  MCHC 34.1  RDW 13.7  LYMPHSABS 0.8  MONOABS 0.8  EOSABS 0.1  BASOSABS 0.0   ------------------------------------------------------------------------------------------------------------------  Chemistries   Recent Labs Lab 03/20/16 1055  NA 139  K 3.7  CL 104  CO2 26  GLUCOSE 121*  BUN 25*  CREATININE 0.96  CALCIUM 9.8   ------------------------------------------------------------------------------------------------------------------ CrCl cannot be calculated (Unknown ideal weight.). ------------------------------------------------------------------------------------------------------------------ No results for input(s): TSH, T4TOTAL, T3FREE, THYROIDAB in the last 72 hours.  Invalid input(s): FREET3  Coagulation profile No results for input(s): INR, PROTIME in the last 168 hours. ------------------------------------------------------------------------------------------------------------------- No results for input(s): DDIMER in the last 72  hours. -------------------------------------------------------------------------------------------------------------------  Cardiac Enzymes No results for input(s): CKMB, TROPONINI, MYOGLOBIN in the last 168 hours.  Invalid input(s): CK ------------------------------------------------------------------------------------------------------------------ No results found for: BNP   ---------------------------------------------------------------------------------------------------------------  Urinalysis    Component Value Date/Time   COLORURINE YELLOW 11/30/2015 [place] 11/30/2015 0942   LABSPEC 1.023 11/30/2015 0942   PHURINE 7.5 11/30/2015 0942   GLUCOSEU NEGATIVE 11/30/2015 0942   HGBUR NEGATIVE 11/30/2015 0942   BILIRUBINUR NEGATIVE 11/30/2015 0942   KETONESUR NEGATIVE 11/30/2015 0942   PROTEINUR NEGATIVE 11/30/2015 0942   UROBILINOGEN 0.2 09/12/2014 1100   NITRITE NEGATIVE 11/30/2015 0942   LEUKOCYTESUR NEGATIVE 11/30/2015 0942    ----------------------------------------------------------------------------------------------------------------   Imaging Results:    Dg Chest 2 View  Result Date: 03/20/2016 CLINICAL DATA:  Medical clearance examination. No current complaints. EXAM: CHEST  2 VIEW COMPARISON:  PA and lateral chest 11/30/2015 and 06/30/2012 FINDINGS: The lungs are clear. Heart size is normal. No pneumothorax or pleural effusion. Thoracic spondylosis is noted. IMPRESSION: No acute disease. Electronically Signed   By: Inge Rise M.D.   On: 03/20/2016 11:16   Dg Tibia/fibula Right  Result Date: 03/20/2016 CLINICAL DATA:  Pt at home this a.m, and jumped from ladder over fence and landed wrong, severe pain, deformity rt lower leg EXAM: RIGHT TIBIA AND FIBULA - 2 VIEW COMPARISON:  None. FINDINGS: There are comminuted displaced fractures of the right tibia and fibula. The fractures extend from the proximal to distal diaphysis of both the tibia and  fibula. There are multiple comminuted fracture components. Major tibial fracture components are displaced as much as 13 mm, with the distal primary fracture component displaced laterally. There is displacement of the fibular fracture components of a maximum of 1 full shaft width. There is slight angulation of the fractures in a posterior lateral direction. Diffuse surrounding soft tissue swelling is noted. The knee and ankle joints are normally aligned. IMPRESSION: 1. Comminuted, displaced and mildly angulated fractures of the right tibia and fibula from the mid through the distal shafts. Electronically Signed   By: Lajean Manes M.D.   On: 03/20/2016 10:19  My personal review of EKG: Rhythm NSR, Rate60   /min, QTc 432 , no Acute ST changes   Assessment & Plan:    Active Problems:   Hyperlipidemia   Essential hypertension   Hypothyroidism   Major depression in remission ([place])   Closed fracture of right tibia and fibula   Right tibia and fibula fracture - Secondary to fall after he was trying to jump of the fence as he locked himself in the backyard, no syncope, no near syncope dizziness or loss of consciousness. - Denies any chest pain, or shortness of breath, EKG nonacute, patient is mild to moderate risk for surgery, will refer further workup. - Plan for surgical repair by Dr. Percell Miller tomorrow morning.  Hypertension - Continue home medication  Hyperthyroidism  - Continue Synthroid   Depression - Continue home medication  Hyperlipidemia - Continue home medication  DVT Prophylaxis   Lovenox ( post op) - SCD  AM Labs Ordered, also please review Full Orders  Family Communication: Admission, patients condition and plan of care including tests being ordered have been discussed with the patient and wifewho indicate understanding and agree with the plan and Code Status.  Code Status Full  Likely DC to : pending PT  Condition GUARDED    Consults called: Ortho, Dr  Percell Miller  Admission status: inpatient  Time spent in minutes : 55 minutes   Micai Apolinar M.D on 03/20/2016 at 1:03 PM  Between 7am to 7pm - Pager - [phone]. After 7pm go to [email or web address] - password [facility]  [facility] - Office  [phone]

## 2016-03-21 ENCOUNTER — Inpatient Hospital Stay (HOSPITAL_COMMUNITY): Payer: PPO | Admitting: Anesthesiology

## 2016-03-21 ENCOUNTER — Inpatient Hospital Stay (HOSPITAL_COMMUNITY): Payer: PPO

## 2016-03-21 ENCOUNTER — Encounter (HOSPITAL_COMMUNITY)
Admission: EM | Disposition: A | Discharge status: Home / Self Care | Payer: Self-pay | Source: Home / Self Care | Attending: Internal Medicine

## 2016-03-21 DIAGNOSIS — E039 Hypothyroidism, unspecified: Secondary | ICD-10-CM | POA: Diagnosis not present

## 2016-03-21 DIAGNOSIS — K760 Fatty (change of) liver, not elsewhere classified: Secondary | ICD-10-CM | POA: Diagnosis not present

## 2016-03-21 DIAGNOSIS — W19XXXA Unspecified fall, initial encounter: Secondary | ICD-10-CM | POA: Diagnosis not present

## 2016-03-21 DIAGNOSIS — D62 Acute posthemorrhagic anemia: Secondary | ICD-10-CM | POA: Diagnosis not present

## 2016-03-21 DIAGNOSIS — E782 Mixed hyperlipidemia: Secondary | ICD-10-CM | POA: Diagnosis not present

## 2016-03-21 DIAGNOSIS — S82201A Unspecified fracture of shaft of right tibia, initial encounter for closed fracture: Secondary | ICD-10-CM | POA: Diagnosis not present

## 2016-03-21 DIAGNOSIS — R7303 Prediabetes: Secondary | ICD-10-CM | POA: Diagnosis not present

## 2016-03-21 DIAGNOSIS — G4733 Obstructive sleep apnea (adult) (pediatric): Secondary | ICD-10-CM | POA: Diagnosis not present

## 2016-03-21 DIAGNOSIS — I10 Essential (primary) hypertension: Secondary | ICD-10-CM

## 2016-03-21 DIAGNOSIS — E669 Obesity, unspecified: Secondary | ICD-10-CM | POA: Diagnosis not present

## 2016-03-21 DIAGNOSIS — S82401A Unspecified fracture of shaft of right fibula, initial encounter for closed fracture: Secondary | ICD-10-CM | POA: Diagnosis not present

## 2016-03-21 DIAGNOSIS — N179 Acute kidney failure, unspecified: Secondary | ICD-10-CM

## 2016-03-21 DIAGNOSIS — S82301A Unspecified fracture of lower end of right tibia, initial encounter for closed fracture: Secondary | ICD-10-CM | POA: Diagnosis not present

## 2016-03-21 DIAGNOSIS — I119 Hypertensive heart disease without heart failure: Secondary | ICD-10-CM | POA: Diagnosis not present

## 2016-03-21 DIAGNOSIS — R509 Fever, unspecified: Secondary | ICD-10-CM | POA: Diagnosis not present

## 2016-03-21 HISTORY — PX: TIBIA IM NAIL INSERTION: SHX2516

## 2016-03-21 LAB — BASIC METABOLIC PANEL
Anion gap: 8 (ref 5–15)
BUN: 35 mg/dL — ABNORMAL HIGH (ref 6–20)
CALCIUM: 9.4 mg/dL (ref 8.9–10.3)
CO2: 27 mmol/L (ref 22–32)
CREATININE: 1.83 mg/dL — AB (ref 0.61–1.24)
Chloride: 104 mmol/L (ref 101–111)
GFR calc non Af Amer: 37 mL/min — ABNORMAL LOW (ref >60)
GFR, EST AFRICAN AMERICAN: 43 mL/min — AB (ref >60)
GLUCOSE: 105 mg/dL — AB (ref 65–99)
Potassium: 4.2 mmol/L (ref 3.5–5.1)
Sodium: 139 mmol/L (ref 135–145)

## 2016-03-21 LAB — CBC
HEMATOCRIT: 40.7 % (ref 39.0–52.0)
Hemoglobin: 13.7 g/dL (ref 13.0–17.0)
MCH: 30.6 pg (ref 26.0–34.0)
MCHC: 33.7 g/dL (ref 30.0–36.0)
MCV: 91.1 fL (ref 78.0–100.0)
PLATELETS: 191 10*3/uL (ref 150–400)
RBC: 4.47 MIL/uL (ref 4.22–5.81)
RDW: 14.3 % (ref 11.5–15.5)
WBC: 7.8 10*3/uL (ref 4.0–10.5)

## 2016-03-21 LAB — SURGICAL PCR SCREEN
MRSA, PCR: NEGATIVE
Staphylococcus aureus: POSITIVE — AB

## 2016-03-21 SURGERY — INSERTION, INTRAMEDULLARY ROD, TIBIA
Anesthesia: General | Site: Leg Lower | Laterality: Right

## 2016-03-21 MED ORDER — LIDOCAINE HCL (CARDIAC) 20 MG/ML IV SOLN
INTRAVENOUS | Status: DC | PRN
  Administered 2016-03-21: 100 mg via INTRAVENOUS

## 2016-03-21 MED ORDER — METOCLOPRAMIDE HCL 5 MG/ML IJ SOLN: 5.0000 mg | Freq: Three times a day (TID) | INTRAMUSCULAR | Status: DC | PRN

## 2016-03-21 MED ORDER — LACTATED RINGERS IV SOLN
INTRAVENOUS | Status: DC | PRN
  Administered 2016-03-21 (×2): via INTRAVENOUS

## 2016-03-21 MED ORDER — ONDANSETRON HCL 4 MG/2ML IJ SOLN: 4.0000 mg | Freq: Four times a day (QID) | INTRAMUSCULAR | Status: DC | PRN

## 2016-03-21 MED ORDER — DIPHENHYDRAMINE HCL 12.5 MG/5ML PO ELIX: 12.5000 mg | ORAL_SOLUTION | ORAL | Status: DC | PRN

## 2016-03-21 MED ORDER — PHENYLEPHRINE HCL 10 MG/ML IJ SOLN
INTRAVENOUS | Status: DC | PRN
  Administered 2016-03-21: 40 ug/min via INTRAVENOUS

## 2016-03-21 MED ORDER — FENTANYL CITRATE (PF) 100 MCG/2ML IJ SOLN
INTRAMUSCULAR | Status: DC | PRN
  Administered 2016-03-21 (×2): 50 ug via INTRAVENOUS
  Administered 2016-03-21: 100 ug via INTRAVENOUS

## 2016-03-21 MED ORDER — PROPOFOL 10 MG/ML IV BOLUS
INTRAVENOUS | Status: DC | PRN
  Administered 2016-03-21: 150 mg via INTRAVENOUS

## 2016-03-21 MED ORDER — BUPIVACAINE HCL (PF) 0.25 % IJ SOLN
INTRAMUSCULAR | Status: AC
  Filled 2016-03-21: qty 30

## 2016-03-21 MED ORDER — POLYETHYLENE GLYCOL 3350 17 G PO PACK: 17.0000 g | PACK | Freq: Every day | ORAL | Status: DC | PRN

## 2016-03-21 MED ORDER — SENNA 8.6 MG PO TABS
1.0000 | ORAL_TABLET | Freq: Two times a day (BID) | ORAL | Status: DC
  Administered 2016-03-21 – 2016-03-23 (×5): 8.6 mg via ORAL
  Filled 2016-03-21 (×5): qty 1

## 2016-03-21 MED ORDER — ACETAMINOPHEN 325 MG PO TABS
650.0000 mg | ORAL_TABLET | Freq: Four times a day (QID) | ORAL | Status: AC
  Administered 2016-03-21 – 2016-03-22 (×4): 650 mg via ORAL
  Filled 2016-03-21 (×4): qty 2

## 2016-03-21 MED ORDER — OXYCODONE-ACETAMINOPHEN 5-325 MG PO TABS: 1.0000 | ORAL_TABLET | ORAL | 0 refills | Status: DC | PRN

## 2016-03-21 MED ORDER — MIDAZOLAM HCL 2 MG/2ML IJ SOLN
INTRAMUSCULAR | Status: AC
  Filled 2016-03-21: qty 2

## 2016-03-21 MED ORDER — BUPIVACAINE HCL (PF) 0.25 % IJ SOLN
INTRAMUSCULAR | Status: DC | PRN
Start: 2016-03-21 — End: 2016-03-21
  Administered 2016-03-21: 30 mL

## 2016-03-21 MED ORDER — ACETAMINOPHEN 650 MG RE SUPP: 650.0000 mg | Freq: Four times a day (QID) | RECTAL | Status: DC | PRN

## 2016-03-21 MED ORDER — CEFAZOLIN SODIUM-DEXTROSE 2-3 GM-% IV SOLR
INTRAVENOUS | Status: DC | PRN
  Administered 2016-03-21: 2 g via INTRAVENOUS

## 2016-03-21 MED ORDER — EPHEDRINE 5 MG/ML INJ
INTRAVENOUS | Status: AC
  Filled 2016-03-21: qty 10

## 2016-03-21 MED ORDER — SODIUM CHLORIDE 0.9 % IV SOLN
INTRAVENOUS | Status: DC
  Administered 2016-03-21 (×2): via INTRAVENOUS

## 2016-03-21 MED ORDER — OXYCODONE HCL 5 MG PO TABS
5.0000 mg | ORAL_TABLET | ORAL | Status: DC | PRN
  Administered 2016-03-21 – 2016-03-23 (×4): 10 mg via ORAL
  Filled 2016-03-21 (×5): qty 2

## 2016-03-21 MED ORDER — MORPHINE SULFATE (PF) 2 MG/ML IV SOLN: 2.0000 mg | INTRAVENOUS | Status: DC | PRN

## 2016-03-21 MED ORDER — PROPOFOL 10 MG/ML IV BOLUS
INTRAVENOUS | Status: AC
  Filled 2016-03-21: qty 20

## 2016-03-21 MED ORDER — LACTATED RINGERS IV SOLN
INTRAVENOUS | Status: DC
  Administered 2016-03-21: 11:00:00 via INTRAVENOUS

## 2016-03-21 MED ORDER — PHENYLEPHRINE HCL 10 MG/ML IJ SOLN
INTRAMUSCULAR | Status: DC | PRN
  Administered 2016-03-21 (×2): 40 ug via INTRAVENOUS

## 2016-03-21 MED ORDER — METHOCARBAMOL 500 MG PO TABS
500.0000 mg | ORAL_TABLET | Freq: Four times a day (QID) | ORAL | Status: DC | PRN
  Administered 2016-03-21 – 2016-03-23 (×3): 500 mg via ORAL
  Filled 2016-03-21 (×4): qty 1

## 2016-03-21 MED ORDER — EPHEDRINE SULFATE 50 MG/ML IJ SOLN
INTRAMUSCULAR | Status: DC | PRN
  Administered 2016-03-21: 10 mg via INTRAVENOUS

## 2016-03-21 MED ORDER — FENTANYL CITRATE (PF) 100 MCG/2ML IJ SOLN
INTRAMUSCULAR | Status: AC
  Filled 2016-03-21: qty 4

## 2016-03-21 MED ORDER — SUCCINYLCHOLINE CHLORIDE 200 MG/10ML IV SOSY
PREFILLED_SYRINGE | INTRAVENOUS | Status: AC
  Filled 2016-03-21: qty 10

## 2016-03-21 MED ORDER — HYDROMORPHONE HCL 1 MG/ML IJ SOLN
0.2500 mg | INTRAMUSCULAR | Status: DC | PRN
Start: 2016-03-21 — End: 2016-03-21

## 2016-03-21 MED ORDER — SUGAMMADEX SODIUM 200 MG/2ML IV SOLN
INTRAVENOUS | Status: DC | PRN
  Administered 2016-03-21: 200 mg via INTRAVENOUS

## 2016-03-21 MED ORDER — DOCUSATE SODIUM 100 MG PO CAPS
100.0000 mg | ORAL_CAPSULE | Freq: Two times a day (BID) | ORAL | Status: DC
  Administered 2016-03-21 – 2016-03-23 (×5): 100 mg via ORAL
  Filled 2016-03-21 (×5): qty 1

## 2016-03-21 MED ORDER — METHOCARBAMOL 1000 MG/10ML IJ SOLN
500.0000 mg | Freq: Four times a day (QID) | INTRAMUSCULAR | Status: DC | PRN
  Filled 2016-03-21: qty 5

## 2016-03-21 MED ORDER — CHLORHEXIDINE GLUCONATE CLOTH 2 % EX PADS
6.0000 | MEDICATED_PAD | Freq: Every day | CUTANEOUS | Status: DC
  Administered 2016-03-21 – 2016-03-22 (×2): 6 via TOPICAL

## 2016-03-21 MED ORDER — CEFAZOLIN SODIUM-DEXTROSE 2-4 GM/100ML-% IV SOLN
INTRAVENOUS | Status: AC
  Filled 2016-03-21: qty 100

## 2016-03-21 MED ORDER — ONDANSETRON HCL 4 MG/2ML IJ SOLN
INTRAMUSCULAR | Status: DC | PRN
  Administered 2016-03-21: 4 mg via INTRAVENOUS

## 2016-03-21 MED ORDER — METHOCARBAMOL 500 MG PO TABS
500.0000 mg | ORAL_TABLET | Freq: Four times a day (QID) | ORAL | 0 refills | Status: DC | PRN
Start: 2016-03-21 — End: 2016-04-15

## 2016-03-21 MED ORDER — DOCUSATE SODIUM 100 MG PO CAPS: 100.0000 mg | ORAL_CAPSULE | Freq: Two times a day (BID) | ORAL | 0 refills | Status: DC

## 2016-03-21 MED ORDER — ONDANSETRON HCL 4 MG/2ML IJ SOLN
INTRAMUSCULAR | Status: AC
  Filled 2016-03-21: qty 2

## 2016-03-21 MED ORDER — GLYCOPYRROLATE 0.2 MG/ML IV SOSY
PREFILLED_SYRINGE | INTRAVENOUS | Status: DC | PRN
  Administered 2016-03-21: .2 mg via INTRAVENOUS

## 2016-03-21 MED ORDER — MIDAZOLAM HCL 5 MG/5ML IJ SOLN
INTRAMUSCULAR | Status: DC | PRN
  Administered 2016-03-21: 2 mg via INTRAVENOUS

## 2016-03-21 MED ORDER — METOCLOPRAMIDE HCL 5 MG PO TABS: 5.0000 mg | ORAL_TABLET | Freq: Three times a day (TID) | ORAL | Status: DC | PRN

## 2016-03-21 MED ORDER — ACETAMINOPHEN 325 MG PO TABS
650.0000 mg | ORAL_TABLET | Freq: Four times a day (QID) | ORAL | Status: DC | PRN
  Filled 2016-03-21: qty 2

## 2016-03-21 MED ORDER — ONDANSETRON HCL 4 MG PO TABS: 4.0000 mg | ORAL_TABLET | Freq: Three times a day (TID) | ORAL | 0 refills | Status: DC | PRN

## 2016-03-21 MED ORDER — MUPIROCIN 2 % EX OINT
1.0000 "application " | TOPICAL_OINTMENT | Freq: Two times a day (BID) | CUTANEOUS | Status: DC
  Administered 2016-03-21 – 2016-03-23 (×4): 1 via NASAL
  Filled 2016-03-21: qty 22

## 2016-03-21 MED ORDER — ROCURONIUM BROMIDE 100 MG/10ML IV SOLN
INTRAVENOUS | Status: DC | PRN
  Administered 2016-03-21: 60 mg via INTRAVENOUS

## 2016-03-21 MED ORDER — 0.9 % SODIUM CHLORIDE (POUR BTL) OPTIME
TOPICAL | Status: DC | PRN
  Administered 2016-03-21: 1000 mL

## 2016-03-21 MED ORDER — CEFAZOLIN SODIUM-DEXTROSE 2-4 GM/100ML-% IV SOLN
2.0000 g | Freq: Four times a day (QID) | INTRAVENOUS | Status: AC
  Administered 2016-03-21 (×3): 2 g via INTRAVENOUS
  Filled 2016-03-21 (×3): qty 100

## 2016-03-21 MED ORDER — ONDANSETRON HCL 4 MG PO TABS: 4.0000 mg | ORAL_TABLET | Freq: Four times a day (QID) | ORAL | Status: DC | PRN

## 2016-03-21 MED ORDER — FENTANYL CITRATE (PF) 100 MCG/2ML IJ SOLN
INTRAMUSCULAR | Status: AC
  Filled 2016-03-21: qty 2

## 2016-03-21 SURGICAL SUPPLY — 68 items
BANDAGE ACE 4X5 VEL STRL LF (GAUZE/BANDAGES/DRESSINGS) ×2 IMPLANT
BANDAGE ACE 6X5 VEL STRL LF (GAUZE/BANDAGES/DRESSINGS) ×2 IMPLANT
BIT DRILL AO GAMMA 4.2X130 (BIT) ×2 IMPLANT
BIT DRILL AO GAMMA 4.2X340 (BIT) ×2 IMPLANT
BLADE SURG 10 STRL SS (BLADE) ×2 IMPLANT
BNDG COHESIVE 4X5 TAN STRL (GAUZE/BANDAGES/DRESSINGS) ×2 IMPLANT
BNDG ELASTIC 6X10 VLCR STRL LF (GAUZE/BANDAGES/DRESSINGS) ×2 IMPLANT
BNDG GAUZE ELAST 4 BULKY (GAUZE/BANDAGES/DRESSINGS) ×2 IMPLANT
CHLORAPREP W/TINT 26ML (MISCELLANEOUS) ×2 IMPLANT
COVER MAYO STAND STRL (DRAPES) ×2 IMPLANT
COVER SURGICAL LIGHT HANDLE (MISCELLANEOUS) ×4 IMPLANT
CUFF TOURNIQUET SINGLE 34IN LL (TOURNIQUET CUFF) IMPLANT
CUFF TOURNIQUET SINGLE 44IN (TOURNIQUET CUFF) IMPLANT
DRAPE C-ARM 42X72 X-RAY (DRAPES) ×2 IMPLANT
DRAPE C-ARMOR (DRAPES) IMPLANT
DRAPE IMP U-DRAPE 54X76 (DRAPES) ×2 IMPLANT
DRAPE PROXIMA HALF (DRAPES) ×2 IMPLANT
DRESSING OPSITE X SMALL 2X3 (GAUZE/BANDAGES/DRESSINGS) ×2 IMPLANT
DRSG ADAPTIC 3X8 NADH LF (GAUZE/BANDAGES/DRESSINGS) ×2 IMPLANT
DRSG PAD ABDOMINAL 8X10 ST (GAUZE/BANDAGES/DRESSINGS) ×8 IMPLANT
DRSG TEGADERM 4X4.75 (GAUZE/BANDAGES/DRESSINGS) ×4 IMPLANT
ELECT CAUTERY BLADE 6.4 (BLADE) ×2 IMPLANT
ELECT REM PT RETURN 9FT ADLT (ELECTROSURGICAL) ×2
ELECTRODE REM PT RTRN 9FT ADLT (ELECTROSURGICAL) ×1 IMPLANT
GAUZE SPONGE 4X4 12PLY STRL (GAUZE/BANDAGES/DRESSINGS) ×2 IMPLANT
GLOVE BIO SURGEON STRL SZ7.5 (GLOVE) ×4 IMPLANT
GLOVE BIOGEL PI IND STRL 8 (GLOVE) ×2 IMPLANT
GLOVE BIOGEL PI INDICATOR 8 (GLOVE) ×2
GOWN STRL REUS W/ TWL LRG LVL3 (GOWN DISPOSABLE) ×3 IMPLANT
GOWN STRL REUS W/TWL LRG LVL3 (GOWN DISPOSABLE) ×3
GUIDEWIRE GAMMA (WIRE) ×2 IMPLANT
GUIDEWIRE GAMMA 800 (WIRE) ×2 IMPLANT
KIT BASIN OR (CUSTOM PROCEDURE TRAY) ×2 IMPLANT
KIT ROOM TURNOVER OR (KITS) ×2 IMPLANT
MANIFOLD NEPTUNE II (INSTRUMENTS) ×2 IMPLANT
NAIL TIBIAL STD 9X345MM (Nail) ×2 IMPLANT
NEEDLE 22X1 1/2 (OR ONLY) (NEEDLE) ×2 IMPLANT
NS IRRIG 1000ML POUR BTL (IV SOLUTION) ×2 IMPLANT
PACK ORTHO EXTREMITY (CUSTOM PROCEDURE TRAY) ×2 IMPLANT
PACK UNIVERSAL I (CUSTOM PROCEDURE TRAY) ×2 IMPLANT
PAD ARMBOARD 7.5X6 YLW CONV (MISCELLANEOUS) ×4 IMPLANT
PAD CAST 4YDX4 CTTN HI CHSV (CAST SUPPLIES) ×1 IMPLANT
PADDING CAST COTTON 4X4 STRL (CAST SUPPLIES) ×1
PADDING CAST COTTON 6X4 STRL (CAST SUPPLIES) ×2 IMPLANT
REAMER SHAFT BIXCUT (INSTRUMENTS) ×2 IMPLANT
SCREW LOCKING FULL THREAD 5X52 (Screw) ×2 IMPLANT
SCREW LOCKING T2 F/T  5MMX65MM (Screw) ×1 IMPLANT
SCREW LOCKING T2 F/T  5X37.5MM (Screw) ×1 IMPLANT
SCREW LOCKING T2 F/T 5MMX65MM (Screw) ×1 IMPLANT
SCREW LOCKING T2 F/T 5X37.5MM (Screw) ×1 IMPLANT
SCREW LOCKING THREADED 5X47.5 (Screw) ×2 IMPLANT
SPONGE GAUZE 4X4 12PLY STER LF (GAUZE/BANDAGES/DRESSINGS) ×2 IMPLANT
SPONGE LAP 18X18 X RAY DECT (DISPOSABLE) ×2 IMPLANT
STAPLER VISISTAT 35W (STAPLE) IMPLANT
SUT ETHILON 3 0 PS 1 (SUTURE) ×4 IMPLANT
SUT MON AB 2-0 CT1 27 (SUTURE) ×2 IMPLANT
SUT VIC AB 0 CT1 27 (SUTURE) ×1
SUT VIC AB 0 CT1 27XBRD ANBCTR (SUTURE) ×1 IMPLANT
SUT VIC AB 2-0 CT1 27 (SUTURE) ×1
SUT VIC AB 2-0 CT1 TAPERPNT 27 (SUTURE) ×1 IMPLANT
SYR BULB IRRIGATION 50ML (SYRINGE) ×2 IMPLANT
SYR CONTROL 10ML LL (SYRINGE) ×2 IMPLANT
TOWEL OR 17X24 6PK STRL BLUE (TOWEL DISPOSABLE) ×2 IMPLANT
TOWEL OR 17X26 10 PK STRL BLUE (TOWEL DISPOSABLE) ×2 IMPLANT
TOWEL OR NON WOVEN STRL DISP B (DISPOSABLE) ×2 IMPLANT
TUBE CONNECTING 12X1/4 (SUCTIONS) ×2 IMPLANT
WATER STERILE IRR 1000ML POUR (IV SOLUTION) ×2 IMPLANT
YANKAUER SUCT BULB TIP NO VENT (SUCTIONS) ×2 IMPLANT

## 2016-03-21 NOTE — Anesthesia Procedure Notes (Signed)
Procedure Name: Intubation Date/Time: 03/21/2016 7:42 AM Performed by: Carney Living Pre-anesthesia Checklist: Patient identified, Emergency Drugs available, Suction available, Patient being monitored and Timeout performed Patient Re-evaluated:Patient Re-evaluated prior to inductionOxygen Delivery Method: Circle system utilized Preoxygenation: Pre-oxygenation with 100% oxygen Intubation Type: IV induction Ventilation: Mask ventilation without difficulty and Oral airway inserted - appropriate to patient size Laryngoscope Size: Mac and 4 Grade View: Grade I Tube type: Oral Tube size: 7.5 mm Number of attempts: 1 Airway Equipment and Method: Stylet Placement Confirmation: ETT inserted through vocal cords under direct vision,  positive ETCO2 and breath sounds checked- equal and bilateral Secured at: 23 cm Tube secured with: Tape Dental Injury: Teeth and Oropharynx as per pre-operative assessment

## 2016-03-21 NOTE — Op Note (Signed)
03/20/2016 - 03/21/2016  11:18 AM  PATIENT:  [name] [name]    PRE-OPERATIVE DIAGNOSIS:  RIGHT TIBIAL FRACTURE  POST-OPERATIVE DIAGNOSIS:  Same  PROCEDURE:  INTRAMEDULLARY (IM) NAIL TIBIAL  SURGEON:  Germain Koopmann, Ernesta Amble, MD  ASSISTANT: Roxan Hockey, PA-C, he was present and scrubbed throughout the case, critical for completion in a timely fashion, and for retraction, instrumentation, and closure.   ANESTHESIA:   General  PREOPERATIVE INDICATIONS:  [name] is a  65 y.o. male with a diagnosis of RIGHT TIBIAL FRACTURE who failed conservative measures and elected for surgical management.    The risks benefits and alternatives were discussed with the patient preoperatively including but not limited to the risks of infection, bleeding, nerve injury, cardiopulmonary complications, the need for revision surgery, among others, and the patient was willing to proceed.  OPERATIVE IMPLANTS: Stryker T2 Tibial nail   BLOOD LOSS: minimal  COMPLICATIONS: none  TOURNIQUET TIME: none  OPERATIVE PROCEDURE:  Patient was identified in the preoperative holding area and site was marked by me He was transported to the operating theater and placed on the table in supine position taking care to pad all bony prominences. After a preincinduction time out anesthesia was induced. The left lower extremity was prepped and draped in normal sterile fashion and a pre-incision timeout was performed. [name] [name] received ancef for preoperative antibiotics.   The leg was placed over the radiolucent triangle. I then made a 4 cm incision over the patella tendon. I dissected down and incised the patella tendon taking care to not penetrate into the joint itself.   I placed a guidewire under fluoroscopic guidance just medial to lateral tibial spine. I was happy with this placement on all views. I used the entry reamer to create a path into the proximal tibia staying out of the joint itself.  I then passed the  ball tip guidewire down the tibia and across the fracture site. I held appropriate reduction confirmed on multiple views of fluoroscopy and sequentially reamed up to an appropriate size with appropriate chatter. I selected the above-sized nail and passed it down the ball-tipped guidewire and seated it completely to a was sunk into the proximal tibia.  I then used the outrigger placed a static transverse and 2 oblique proximal locking screws. As happy with her length and placement on multiple oblique fluoroscopic views.  Next I confirmed appropriate rotation of the tibia with fracture keys and orientation of the patella and toes. I removed the bump and placed legs next to eachother to confirm length and rotation. I then used a perfect circles technique to place 2 distal static interlock screws.  The wounds were then all thoroughly irrigated and closed in layers. Sterile dressing was applied and he was taken to the PACU in stable condition.  POST OPERATIVE PLAN: WBAT, DVT prophylaxis: Early ambulation, SCD's, chemical px: ASA 325 on d/c, lovenox while inpatient

## 2016-03-21 NOTE — Progress Notes (Signed)
Pt with a low grade temp T= 100.5 Paged MD on call. He recommended we watch and wait. Will continue to monitor.

## 2016-03-21 NOTE — Evaluation (Signed)
Physical Therapy Evaluation Patient Details Name: [name] [name] MRN: [id number] DOB: 1951/05/07 Today's Date: 03/21/2016   History of Present Illness  65 yo admitted with RLE tib/fib fx after locking himself in the backyard and trying to jump the fence. PMHx: HTN, HLD, hypothyroidism, depression, ankle fx from roller skating  Clinical Impression  Pt very pleasant and joking about his predicament leading to fx and admission. Pt normally very independent with wife who can assist for a few days as she works part time. Pt with good ability with transfers but limited gait POD#0. Pt with decreased gait and function who will benefit from acute therapy to maximize mobility and independence to decrease burden of care.     Follow Up Recommendations Home health PT    Equipment Recommendations  Rolling walker with 5" wheels    Recommendations for Other Services       Precautions / Restrictions Precautions Precautions: Fall Restrictions Weight Bearing Restrictions: Yes RLE Weight Bearing: Weight bearing as tolerated      Mobility  Bed Mobility Overal bed mobility: Modified Independent                Transfers Overall transfer level: Needs assistance   Transfers: Sit to/from Stand Sit to Stand: Min guard         General transfer comment: cues for hand placement and safety  Ambulation/Gait Ambulation/Gait assistance: Min assist Ambulation Distance (Feet): 4 Feet Assistive device: Rolling walker (2 wheeled) Gait Pattern/deviations: Step-to pattern;Decreased stance time - right   Gait velocity interpretation: Below normal speed for age/gender General Gait Details: cues for RW use, posture and sequence  Stairs            Wheelchair Mobility    Modified Rankin (Stroke Patients Only)       Balance Overall balance assessment: No apparent balance deficits (not formally assessed)                                           Pertinent Vitals/Pain  Pain Assessment: 0-10 Pain Score: 1  Pain Location: RLE Pain Descriptors / Indicators: Sore Pain Intervention(s): Limited activity within patient's tolerance;Premedicated before session;Repositioned;Monitored during session    Home Living Family/patient expects to be discharged to:: Private residence Living Arrangements: Spouse/significant other Available Help at Discharge: Family;Available 24 hours/day Type of Home: House Home Access: Stairs to enter   [employer] of Steps: 3 Home Layout: One level Home Equipment: Cane - single point      Prior Function Level of Independence: Independent               Hand Dominance        Extremity/Trunk Assessment   Upper Extremity Assessment: Overall WFL for tasks assessed           Lower Extremity Assessment: RLE deficits/detail RLE Deficits / Details: decreased ROM and strength as anticipated post op    Cervical / Trunk Assessment: Normal  Communication   Communication: No difficulties  Cognition Arousal/Alertness: Awake/alert Behavior During Therapy: WFL for tasks assessed/performed Overall Cognitive Status: Within Functional Limits for tasks assessed                      General Comments      Exercises General Exercises - Lower Extremity Long Arc Quad: AROM;Right;10 reps;Seated   Assessment/Plan    PT Assessment Patient needs continued PT services  PT Problem List Decreased strength;Decreased activity tolerance;Decreased knowledge of use of DME;Decreased mobility          PT Treatment Interventions Gait training;Stair training;Functional mobility training;Therapeutic exercise;Patient/family education;DME instruction;Therapeutic activities    PT Goals (Current goals can be found in the Care Plan section)  Acute Rehab PT Goals Patient Stated Goal: return home PT Goal Formulation: With patient/family Time For Goal Achievement: 03/28/16 Potential to Achieve Goals: Good    Frequency  Min 5X/week   Barriers to discharge        Co-evaluation               End of Session Equipment Utilized During Treatment: Gait belt Activity Tolerance: Patient tolerated treatment well Patient left: in chair;with call bell/phone within reach;with family/visitor present Nurse Communication: Mobility status;Weight bearing status         Time: 1215-1228 PT Time Calculation (min) (ACUTE ONLY): 13 min   Charges:   PT Evaluation $PT Eval Moderate Complexity: 1 Procedure     PT G CodesMelford Aase 03/21/2016, 2:21 PM  Elwyn Reach, [place]

## 2016-03-21 NOTE — Consult Note (Signed)
ORTHOPAEDIC CONSULTATION  REQUESTING PHYSICIAN: Albertine Patricia, MD  Chief Complaint: right tibia fracture  HPI: [name] [name] is a 65 y.o. male who complains of mechanical fall over his fence. Pain controlled  Past Medical History:  Diagnosis Date  . Cardiomegaly   . Colon polyp   . Fatty liver disease, nonalcoholic   . Gallstones   . Hypertension   . Hypogonadism male   . Mixed hyperlipidemia   . Obesity   . Pancreas (digestive gland) works poorly   . Prediabetes   . Sleep apnea   . Vitamin D deficiency    Past Surgical History:  Procedure Laterality Date  . ANKLE FRACTURE SURGERY Left 1986  . APPENDECTOMY  1964  . CARPAL TUNNEL RELEASE Bilateral 1996  . CHOLECYSTECTOMY  1976   Social History   Social History  . Marital status: Married    Spouse name: N/A  . Number of children: 2  . Years of education: N/A   Occupational History  . [employer]   Social History Main Topics  . Smoking status: Former Smoker    Packs/day: 2.00    Years: 7.00    Types: Cigarettes    Quit date: 05/27/1974  . Smokeless tobacco: Never Used  . Alcohol use No  . Drug use: No  . Sexual activity: Not Asked   Other Topics Concern  . None   Social History Narrative  . None   Family History  Problem Relation Age of Onset  . Diabetes Mother   . Diabetes Father     brother and sister  . Heart disease Father   . Hypertension Father   . Diabetes Sister    Allergies  Allergen Reactions  . Ace Inhibitors Other (See Comments)    cough  . Bee Venom Other (See Comments)   Prior to Admission medications   Medication Sig Start Date End Date Taking? Authorizing Provider  Ascorbic Acid (VITAMIN C) 1000 MG tablet Take 1,000 mg by mouth daily.    Yes Historical Provider, MD  aspirin 325 MG tablet Take 325 mg by mouth daily.     Yes Historical Provider, MD  B Complex Vitamins (B COMPLEX PO) Take 1 tablet by mouth See admin instructions. Takes M,W,F   Yes  Historical Provider, MD  Cholecalciferol (VITAMIN D-1000 MAX ST) 1000 units tablet Take by mouth.   Yes Historical Provider, MD  EPINEPHrine (EPIPEN 2-PAK) 0.3 mg/0.3 mL IJ SOAJ injection Inject 0.3 mLs (0.3 mg total) into the muscle once. 03/06/14  Yes Leonard Schwartz, MD  fish oil-omega-3 fatty acids 1000 MG capsule Take 2 g by mouth 3 (three) times daily.    Yes Historical Provider, MD  Flaxseed, Linseed, (FLAX SEED OIL) 1000 MG CAPS Take 2,000 mg by mouth daily.   Yes Historical Provider, MD  hydrochlorothiazide (HYDRODIURIL) 25 MG tablet TAKE 1 TABLET EVERY MORNING Patient taking differently: TAKE 12.5 mg EVERY MORNING 07/16/15  Yes Unk Pinto, MD  levothyroxine (SYNTHROID, LEVOTHROID) 50 MCG tablet Take 1 tablet (50 mcg total) by mouth daily. 01/08/16  Yes Unk Pinto, MD  MAGNESIUM PO Take 650 mg by mouth daily.    Yes Historical Provider, MD  metoprolol tartrate (LOPRESSOR) 25 MG tablet Take 1 tablet (25 mg total) by mouth 2 (two) times daily. Patient taking differently: Take 12.5 mg by mouth 2 (two) times daily.  02/28/16  Yes Unk Pinto, MD  Multiple Vitamin (MULTIVITAMIN) capsule Take by mouth.   Yes Historical Provider,  MD  sertraline (ZOLOFT) 100 MG tablet TAKE ONE AND ONE-HALF TO TWO TABLETS DAILY AS DIRECTED FOR MOOD 02/28/16  Yes Unk Pinto, MD  valsartan (DIOVAN) 320 MG tablet TAKE ONE-HALF (1/2) TO ONE TABLET DAILY 11/19/15  Yes Courtney Forcucci, PA-C  ZETIA 10 MG tablet TAKE 1 TABLET DAILY Patient taking differently: TAKE 1 TABLET DAILY EVERY EVENING 03/20/15  Yes Unk Pinto, MD  fenofibrate (TRICOR) 145 MG tablet Take 1 tablet (145 mg total) by mouth daily. Patient not taking: Reported on 03/20/2016 11/14/14 11/14/15  Unk Pinto, MD  Syringe/Needle, Disp, (SYRINGE 3CC/21GX1") 21G X 1" 3 ML MISC Use AD with testosterone Rx 03/01/15   Unk Pinto, MD  testosterone cypionate (DEPOTESTOSTERONE CYPIONATE) 200 MG/ML injection Inject 2 mLs (400 mg total) into  the muscle every 14 (fourteen) days. Patient not taking: Reported on 03/20/2016 03/01/15   Unk Pinto, MD   Dg Chest 2 View  Result Date: 03/20/2016 CLINICAL DATA:  Medical clearance examination. No current complaints. EXAM: CHEST  2 VIEW COMPARISON:  PA and lateral chest 11/30/2015 and 06/30/2012 FINDINGS: The lungs are clear. Heart size is normal. No pneumothorax or pleural effusion. Thoracic spondylosis is noted. IMPRESSION: No acute disease. Electronically Signed   By: Inge Rise M.D.   On: 03/20/2016 11:16   Dg Tibia/fibula Right  Result Date: 03/20/2016 CLINICAL DATA:  Pt at home this a.m, and jumped from ladder over fence and landed wrong, severe pain, deformity rt lower leg EXAM: RIGHT TIBIA AND FIBULA - 2 VIEW COMPARISON:  None. FINDINGS: There are comminuted displaced fractures of the right tibia and fibula. The fractures extend from the proximal to distal diaphysis of both the tibia and fibula. There are multiple comminuted fracture components. Major tibial fracture components are displaced as much as 13 mm, with the distal primary fracture component displaced laterally. There is displacement of the fibular fracture components of a maximum of 1 full shaft width. There is slight angulation of the fractures in a posterior lateral direction. Diffuse surrounding soft tissue swelling is noted. The knee and ankle joints are normally aligned. IMPRESSION: 1. Comminuted, displaced and mildly angulated fractures of the right tibia and fibula from the mid through the distal shafts. Electronically Signed   By: Lajean Manes M.D.   On: 03/20/2016 10:19    Positive ROS: All other systems have been reviewed and were otherwise negative with the exception of those mentioned in the HPI and as above.  Labs cbc  Recent Labs  03/20/16 1055  WBC 8.0  HGB 15.2  HCT 44.6  PLT 193    Labs inflam No results for input(s): CRP in the last 72 hours.  Invalid input(s): ESR  Labs coag No  results for input(s): INR, PTT in the last 72 hours.  Invalid input(s): PT   Recent Labs  03/20/16 1055 03/20/16 1921  NA 139  --   K 3.7  --   CL 104  --   CO2 26  --   GLUCOSE 121*  --   BUN 25*  --   CREATININE 0.96 1.16  CALCIUM 9.8  --     Physical Exam: Vitals:   03/20/16 1905 03/21/16 0015  BP: 124/71 127/64  Pulse: 76 79  Resp: 16 16  Temp: 99.3 F (37.4 C) 99 F (37.2 C)   General: Alert, no acute distress Cardiovascular: No pedal edema Respiratory: No cyanosis, no use of accessory musculature GI: No organomegaly, abdomen is soft and non-tender Skin: No lesions in the  area of chief complaint other than those listed below in MSK exam.  Neurologic: Sensation intact distally save for the below mentioned MSK exam Psychiatric: Patient is competent for consent with normal mood and affect Lymphatic: No axillary or cervical lymphadenopathy  MUSCULOSKELETAL:  RLE: NVI, compartments soft. Splint intact Other extremities are atraumatic with painless ROM and NVI.  Assessment: Right tibia fracture  Plan: IM nail this am. Mobilize post op dispo once mobilized   Renette Butters, MD Cell [phone]   03/21/2016 6:27 AM

## 2016-03-21 NOTE — Anesthesia Postprocedure Evaluation (Signed)
Anesthesia Post Note  Patient: [name] [name]  Procedure(s) Performed: Procedure(s) (LRB): INTRAMEDULLARY (IM) NAIL TIBIAL (Right)  Patient location during evaluation: PACU Anesthesia Type: General Level of consciousness: awake and alert Pain management: pain level controlled Vital Signs Assessment: post-procedure vital signs reviewed and stable Respiratory status: spontaneous breathing, nonlabored ventilation, respiratory function stable and patient connected to nasal cannula oxygen Cardiovascular status: blood pressure returned to baseline and stable Postop Assessment: no signs of nausea or vomiting Anesthetic complications: no    Last Vitals:  Vitals:   03/21/16 1005 03/21/16 1010  BP: 107/61 104/65  Pulse: 78 85  Resp: 15 11  Temp:  36.9 C    Last Pain:  Vitals:   03/21/16 1005  TempSrc:   PainSc: 0-No pain                 Jamaiyah Pyle,W. EDMOND

## 2016-03-21 NOTE — Progress Notes (Signed)
PROGRESS NOTE    [name] [name]  [id number] DOB: 1950-11-17 DOA: 03/20/2016 PCP: Alesia Richards, MD   Brief Narrative:   [name] [name]  is a 65 y.o. male, With past medical history of hypertension, hyperlipidemia, hypoaldosteronism, hypothyroidism, depression, patient presents secondary to right leg injury, patient reports he locked himself in the backyard, was trying to jump 4 foot fence, he landed on his right leg, reports so than right leg pain upon touching the ground, unable to walk on it, he denies any syncope, presyncope, loss of consciousness, chest pain, shortness of breath. - IN ED workup significant for right tibia/fibula fracture, labs with no abnormalities, chest x-ray with no acute findings, EKG nonacute, discussed with Dr. Percell Miller, he will operate tomorrow morning, like for the  patient to be transferred to [facility].  Assessment & Plan:   Active Problems:   Hyperlipidemia   Essential hypertension   Hypothyroidism   Major depression in remission ([place])   Closed fracture of right tibia and fibula   Right tibia and fibula fracture - Secondary to fall after he was trying to jump of the fence as he locked himself in the backyard, no syncope, no near syncope dizziness or loss of consciousness. - Plan for surgical repair by Dr. Percell Miller today.  -bowel regimen.   AKI;  Cr increase to 1.8.  Bladder scan, foley if urine retention.  IV fluid.  Hold ARB and HCTZ    Hypertension - Hold irbesartan and HCTZ due to AKI.  -continue with metoprolol.   Hyperthyroidism  - Continue Synthroid   Depression - Continue home medication  Hyperlipidemia - Continue home medication     DVT prophylaxis: Lovenox Code Status: full code Family Communication: wife at bedside.  Disposition Plan: remain inpatient post op care and AKI.    Consultants:   Percell Miller   Procedures:  INTRAMEDULLARY (IM) NAIL TIBIAL   Antimicrobials:  none   Subjective: He is feeling  ok, he has not urinated since last night   Objective: Vitals:   03/21/16 0934 03/21/16 0950 03/21/16 1005 03/21/16 1010  BP: 134/79 110/68 107/61 104/65  Pulse: 88 75 78 85  Resp: 17 14 15 11   Temp: 98 F (36.7 C)   98.4 F (36.9 C)  TempSrc:      SpO2: 96% 99% 100% 98%  Weight:      Height:        Intake/Output Summary (Last 24 hours) at 03/21/16 1239 Last data filed at 03/21/16 1152  Gross per 24 hour  Intake             2080 ml  Output              900 ml  Net             1180 ml   Filed Weights   03/20/16 1313  Weight: 104.3 kg (230 lb)    Examination:  General exam: Appears calm and comfortable  Respiratory system: Clear to auscultation. Respiratory effort normal. Cardiovascular system: S1 & S2 heard, RRR. No JVD, murmurs, rubs, gallops or clicks. No pedal edema. Gastrointestinal system: Abdomen is nondistended, soft and nontender. No organomegaly or masses felt. Normal bowel sounds heard. Central nervous system: Alert and oriented. No focal neurological deficits. Extremities: Symmetric 5 x 5 power.  Skin: No rashes, lesions or ulcers Psychiatry: Judgement and insight appear normal. Mood & affect appropriate.     Data Reviewed: I have personally reviewed following labs and imaging studies  CBC:  Recent Labs Lab 03/20/16 1055 03/21/16 0546  WBC 8.0 7.8  NEUTROABS 6.3  --   HGB 15.2 13.7  HCT 44.6 40.7  MCV 90.5 91.1  PLT 193 [id number]   Basic Metabolic Panel:  Recent Labs Lab 03/20/16 1055 03/20/16 1921 03/21/16 0546  NA 139  --  139  K 3.7  --  4.2  CL 104  --  104  CO2 26  --  27  GLUCOSE 121*  --  105*  BUN 25*  --  35*  CREATININE 0.96 1.16 1.83*  CALCIUM 9.8  --  9.4   GFR: Estimated Creatinine Clearance: 47.1 mL/min (by C-G formula based on SCr of 1.83 mg/dL (H)). Liver Function Tests: No results for input(s): AST, ALT, ALKPHOS, BILITOT, PROT, ALBUMIN in the last 168 hours. No results for input(s): LIPASE, AMYLASE in the last 168  hours. No results for input(s): AMMONIA in the last 168 hours. Coagulation Profile: No results for input(s): INR, PROTIME in the last 168 hours. Cardiac Enzymes: No results for input(s): CKTOTAL, CKMB, CKMBINDEX, TROPONINI in the last 168 hours. BNP (last 3 results) No results for input(s): PROBNP in the last 8760 hours. HbA1C: No results for input(s): HGBA1C in the last 72 hours. CBG:  Recent Labs Lab 03/20/16 1821  GLUCAP 116*   Lipid Profile: No results for input(s): CHOL, HDL, LDLCALC, TRIG, CHOLHDL, LDLDIRECT in the last 72 hours. Thyroid Function Tests: No results for input(s): TSH, T4TOTAL, FREET4, T3FREE, THYROIDAB in the last 72 hours. Anemia Panel: No results for input(s): VITAMINB12, FOLATE, FERRITIN, TIBC, IRON, RETICCTPCT in the last 72 hours. Sepsis Labs: No results for input(s): PROCALCITON, LATICACIDVEN in the last 168 hours.  Recent Results (from the past 240 hour(s))  Surgical pcr screen     Status: Abnormal   Collection Time: 03/20/16 12:44 AM  Result Value Ref Range Status   MRSA, PCR NEGATIVE NEGATIVE Final   Staphylococcus aureus POSITIVE (A) NEGATIVE Final    Comment:        The Xpert SA Assay (FDA approved for NASAL specimens in patients over 23 years of age), is one component of a comprehensive surveillance program.  Test performance has been validated by [facility] for patients greater than or equal to 43 year old. It is not intended to diagnose infection nor to guide or monitor treatment.          Radiology Studies: Dg Chest 2 View  Result Date: 03/20/2016 CLINICAL DATA:  Medical clearance examination. No current complaints. EXAM: CHEST  2 VIEW COMPARISON:  PA and lateral chest 11/30/2015 and 06/30/2012 FINDINGS: The lungs are clear. Heart size is normal. No pneumothorax or pleural effusion. Thoracic spondylosis is noted. IMPRESSION: No acute disease. Electronically Signed   By: Inge Rise M.D.   On: 03/20/2016 11:16   Dg  Tibia/fibula Right  Result Date: 03/21/2016 CLINICAL DATA:  Intra medullary tibial nail. Initial encounter. EXAM: RIGHT TIBIA AND FIBULA - 2 VIEW; DG C-ARM 61-120 MIN COMPARISON:  Yesterday FINDINGS: Fluoroscopy for tibial nail fixation. No periprosthetic fracture. Much improved alignment of tibial diaphysis fracture fragments. Comminuted fibular diaphysis fracture also shows interval reduction. There is a remote and healed distal fibula diaphysis fracture. IMPRESSION: Fluoroscopy for tibial and fibular fracture reduction and tibial fixation. Electronically Signed   By: Monte Fantasia M.D.   On: 03/21/2016 09:31   Dg Tibia/fibula Right  Result Date: 03/20/2016 CLINICAL DATA:  Pt at home this a.m, and jumped from ladder over fence and landed wrong,  severe pain, deformity rt lower leg EXAM: RIGHT TIBIA AND FIBULA - 2 VIEW COMPARISON:  None. FINDINGS: There are comminuted displaced fractures of the right tibia and fibula. The fractures extend from the proximal to distal diaphysis of both the tibia and fibula. There are multiple comminuted fracture components. Major tibial fracture components are displaced as much as 13 mm, with the distal primary fracture component displaced laterally. There is displacement of the fibular fracture components of a maximum of 1 full shaft width. There is slight angulation of the fractures in a posterior lateral direction. Diffuse surrounding soft tissue swelling is noted. The knee and ankle joints are normally aligned. IMPRESSION: 1. Comminuted, displaced and mildly angulated fractures of the right tibia and fibula from the mid through the distal shafts. Electronically Signed   By: Lajean Manes M.D.   On: 03/20/2016 10:19   Dg Tibia/fibula Right Port  Result Date: 03/21/2016 CLINICAL DATA:  Status post fixation of right tibia fracture. EXAM: PORTABLE RIGHT TIBIA AND FIBULA - 2 VIEW COMPARISON:  03/20/2016 tibia/ fibula radiographs. 03/21/2016 intraoperative fluoroscopic  images. FINDINGS: Comminuted diaphyseal fractures of the tibia and fibula are again demonstrated. Antegrade intramedullary nail fixation with proximal and distal interlocking screws across the tibia fractures with improved alignment from the preoperative study, unchanged from the intraoperative images. Fibular fracture also demonstrates much improved alignment compared to the preoperative study. There is stable to slightly increased lateral angulation of a fragment at the level of the mid fibular diaphysis compared to the intraoperative study. Slightly less than 1 shaft width posterior displacement of a fibular fragment at the level of the mid diaphysis is similar to the intraoperative study. Slight posterior displacement of the main distal fragment is unchanged from the intraoperative study. An old, healed distal fibular diaphyseal fracture is again noted. The knee and ankle are located. There is diffuse soft tissue swelling in the lower leg. IMPRESSION: Comminuted tibia and fibular diaphyseal fractures with improved alignment following reduction and tibial fixation as above. Electronically Signed   By: Logan Bores M.D.   On: 03/21/2016 10:08   Dg C-arm 1-60 Min  Result Date: 03/21/2016 CLINICAL DATA:  Intra medullary tibial nail. Initial encounter. EXAM: RIGHT TIBIA AND FIBULA - 2 VIEW; DG C-ARM 61-120 MIN COMPARISON:  Yesterday FINDINGS: Fluoroscopy for tibial nail fixation. No periprosthetic fracture. Much improved alignment of tibial diaphysis fracture fragments. Comminuted fibular diaphysis fracture also shows interval reduction. There is a remote and healed distal fibula diaphysis fracture. IMPRESSION: Fluoroscopy for tibial and fibular fracture reduction and tibial fixation. Electronically Signed   By: Monte Fantasia M.D.   On: 03/21/2016 09:31        Scheduled Meds: . acetaminophen  650 mg Oral Q6H  . aspirin  325 mg Oral Daily  . B-complex with vitamin C  1 tablet Oral Daily  . ceFAZolin       .  ceFAZolin (ANCEF) IV  2 g Intravenous Q6H  . Chlorhexidine Gluconate Cloth  6 each Topical Daily  . docusate sodium  100 mg Oral BID  . enoxaparin (LOVENOX) injection  40 mg Subcutaneous Q24H  . ezetimibe  10 mg Oral Daily  . levothyroxine  50 mcg Oral QAC breakfast  . metoprolol tartrate  12.5 mg Oral BID  . multivitamin with minerals  1 tablet Oral Daily  . mupirocin ointment  1 application Nasal BID  . omega-3 acid ethyl esters  2 g Oral TID  . senna  1 tablet Oral BID  .  sertraline  100 mg Oral Daily  . vitamin C  1,000 mg Oral Daily   Continuous Infusions: . sodium chloride 100 mL/hr at 03/21/16 1145     LOS: 1 day    Time spent: 35 minutes.    Elmarie Shiley, MD [facility] Pager [phone]  If 7PM-7AM, please contact night-coverage [email or web address] Password [email or web address] 03/21/2016, 12:39 PM

## 2016-03-21 NOTE — Transfer of Care (Signed)
Immediate Anesthesia Transfer of Care Note  Patient: [name] [name]  Procedure(s) Performed: Procedure(s): INTRAMEDULLARY (IM) NAIL TIBIAL (Right)  Patient Location: PACU  Anesthesia Type:General  Level of Consciousness: awake, alert , oriented and patient cooperative  Airway & Oxygen Therapy: Patient Spontanous Breathing and Patient connected to nasal cannula oxygen  Post-op Assessment: Report given to RN, Post -op Vital signs reviewed and stable and Patient moving all extremities X 4  Post vital signs: Reviewed and stable  Last Vitals:  Vitals:   03/21/16 0608 03/21/16 0934  BP: 130/82 134/79  Pulse: 83 88  Resp: 16 17  Temp: 36.7 C 36.7 C    Last Pain:  Vitals:   03/21/16 0934  TempSrc:   PainSc: (P) 0-No pain      Patients Stated Pain Goal: 0 ([id number])  Complications: No apparent anesthesia complications

## 2016-03-22 ENCOUNTER — Encounter (HOSPITAL_COMMUNITY): Payer: Self-pay | Admitting: Orthopedic Surgery

## 2016-03-22 DIAGNOSIS — N179 Acute kidney failure, unspecified: Secondary | ICD-10-CM | POA: Diagnosis not present

## 2016-03-22 DIAGNOSIS — E039 Hypothyroidism, unspecified: Secondary | ICD-10-CM | POA: Diagnosis not present

## 2016-03-22 DIAGNOSIS — I10 Essential (primary) hypertension: Secondary | ICD-10-CM | POA: Diagnosis not present

## 2016-03-22 LAB — CBC
HCT: 33.3 % — ABNORMAL LOW (ref 39.0–52.0)
Hemoglobin: 11.1 g/dL — ABNORMAL LOW (ref 13.0–17.0)
MCH: 31.2 pg (ref 26.0–34.0)
MCHC: 33.3 g/dL (ref 30.0–36.0)
MCV: 93.5 fL (ref 78.0–100.0)
PLATELETS: 140 10*3/uL — AB (ref 150–400)
RBC: 3.56 MIL/uL — ABNORMAL LOW (ref 4.22–5.81)
RDW: 14.5 % (ref 11.5–15.5)
WBC: 6.3 10*3/uL (ref 4.0–10.5)

## 2016-03-22 LAB — BASIC METABOLIC PANEL
Anion gap: 7 (ref 5–15)
BUN: 21 mg/dL — AB (ref 6–20)
CHLORIDE: 103 mmol/L (ref 101–111)
CO2: 29 mmol/L (ref 22–32)
CREATININE: 1 mg/dL (ref 0.61–1.24)
Calcium: 8.6 mg/dL — ABNORMAL LOW (ref 8.9–10.3)
GFR calc Af Amer: >60 mL/min (ref >60)
GFR calc non Af Amer: >60 mL/min (ref >60)
GLUCOSE: 131 mg/dL — AB (ref 65–99)
POTASSIUM: 4.3 mmol/L (ref 3.5–5.1)
SODIUM: 139 mmol/L (ref 135–145)

## 2016-03-22 NOTE — Progress Notes (Signed)
Orthopedic Tech Progress Note Patient Details:  [name] [name] 11-17-1950 [id number]  CAM walker applied to Rt leg. Because of the bulky dressing on Rt Leg Cam walker seems too loose. If patient is still here when dressing is taken off gave instructions to call Ortho, might need to be refitted. If patient is sent home gave him instructions on how to adjust it. Ortho Devices Type of Ortho Device: CAM walker Ortho Device/Splint Location: Rt Leg Ortho Device/Splint Interventions: Ordered, Application   [name] 03/22/2016, 9:45 AM

## 2016-03-22 NOTE — Progress Notes (Signed)
[facility]  Patient Status: New  [facility] is providing the following services: PT  If patient discharges after hours, please call [phone].   [name] [name] 03/22/2016, 12:47 PM

## 2016-03-22 NOTE — Evaluation (Signed)
Occupational Therapy Evaluation Patient Details Name: [name] [name] MRN: [id number] DOB: 11/06/1950 Today's Date: 03/22/2016    History of Present Illness 65 yo admitted with RLE tib/fib fx after locking himself in the backyard and trying to jump the fence. PMHx: HTN, HLD, hypothyroidism, depression, ankle fx from roller skating   Clinical Impression   PTA Pt independent in ADL and mobility. Pt currently mod A for lower body ADL and min guard for short distance mobility with RW. Pt is very motivated to work with therapy. Today's session focused on AE for LB bathing/dressing and functional transfers for ADL. Pt also provided with theraband for upper extremity strengthening. Pt will benefit from skilled OT in the acute care setting prior to dc home with intermittent support from family to maximize independence in ADL. Next session to focus on standing level ADL for stamina.    Follow Up Recommendations  No OT follow up;Supervision - Intermittent    Equipment Recommendations  3 in 1 bedside comode    Recommendations for Other Services       Precautions / Restrictions Precautions Precautions: Fall Required Braces or Orthoses: Other Brace/Splint Other Brace/Splint: CAM walking boot Restrictions Weight Bearing Restrictions: Yes RLE Weight Bearing: Weight bearing as tolerated      Mobility Bed Mobility Overal bed mobility: Needs Assistance Bed Mobility: Supine to Sit     Supine to sit: Min assist;HOB elevated     General bed mobility comments: pt required increased time, use of bed rails and min A with movement of R LE  Transfers Overall transfer level: Needs assistance Equipment used: Rolling walker (2 wheeled) Transfers: Sit to/from Stand Sit to Stand: Min guard         General transfer comment: Pt required verbal cues for safe hand placement. Pt required increased time.    Balance Overall balance assessment: Needs assistance Sitting-balance support: Bilateral  upper extremity supported;Feet supported Sitting balance-Leahy Scale: Poor     Standing balance support: Bilateral upper extremity supported;During functional activity Standing balance-Leahy Scale: Poor Standing balance comment: pt reliant on bilateral UEs on RW                            ADL Overall ADL's : Needs assistance/impaired Eating/Feeding: Modified independent;Sitting   Grooming: Wash/dry face;Oral care;Modified independent;Sitting Grooming Details (indicate cue type and reason): recliner level Upper Body Bathing: Modified independent;Sitting   Lower Body Bathing: Set up;With adaptive equipment;With caregiver independent assisting;Sit to/from stand Lower Body Bathing Details (indicate cue type and reason): educated in long handle sponge Upper Body Dressing : Modified independent;Sitting   Lower Body Dressing: Moderate assistance;With adaptive equipment;With caregiver independent assisting;Sit to/from stand Lower Body Dressing Details (indicate cue type and reason): Pt and family educated in Chief Technology Officer and practiced with AE Toilet Transfer: Min guard;Ambulation;BSC;RW Toilet Transfer Details (indicate cue type and reason): simulated with recliner     Tub/ Shower Transfer: Walk-in shower;Min guard;Ambulation;Rolling walker;3 in 1 Tub/Shower Transfer Details (indicate cue type and reason): educated on use of 3 in 1 as shower chair and safety sequence Functional mobility during ADLs: Min guard;Rolling walker General ADL Comments: Pt is a [place] around Christmas. Pt enjoys tools and creating things. Very proactive. Support from family will be wife who works part time and son is paramedic EMT.     Vision     Perception     Praxis      Pertinent Vitals/Pain Pain Assessment: 0-10 Pain Score: 2  Pain Location: RLE, at incision site Pain Descriptors / Indicators: Dull;Sore Pain Intervention(s): Monitored during session;Limited activity within patient's  tolerance;Repositioned;Ice applied     Hand Dominance Right   Extremity/Trunk Assessment Upper Extremity Assessment Upper Extremity Assessment: Overall WFL for tasks assessed   Lower Extremity Assessment Lower Extremity Assessment: RLE deficits/detail RLE Deficits / Details: decreased ROM and strength as anticipated post op   Cervical / Trunk Assessment Cervical / Trunk Assessment: Normal   Communication Communication Communication: No difficulties   Cognition Arousal/Alertness: Awake/alert Behavior During Therapy: WFL for tasks assessed/performed Overall Cognitive Status: Within Functional Limits for tasks assessed                     General Comments       Exercises       Shoulder Instructions      Home Living Family/patient expects to be discharged to:: Private residence Living Arrangements: Spouse/significant other Available Help at Discharge: Family;Available 24 hours/day Type of Home: House Home Access: Stairs to enter [employer] of Steps: 3 Entrance Stairs-Rails: None Home Layout: One level     Bathroom Shower/Tub: Occupational psychologist: Standard Bathroom Accessibility: Yes How Accessible: Accessible via walker Home Equipment: [place] - single point;Hand held shower head          Prior Functioning/Environment Level of Independence: Independent                 OT Problem List: Decreased strength;Decreased range of motion;Decreased activity tolerance;Impaired balance (sitting and/or standing);Decreased safety awareness;Decreased knowledge of use of DME or AE;Obesity;Pain   OT Treatment/Interventions: Self-care/ADL training;DME and/or AE instruction;Therapeutic activities;Patient/family education;Balance training    OT Goals(Current goals can be found in the care plan section) Acute Rehab OT Goals Patient Stated Goal: return home OT Goal Formulation: With patient/family Time For Goal Achievement: 03/29/16 Potential  to Achieve Goals: Good ADL Goals Pt Will Perform Grooming: with supervision;standing Pt Will Transfer to Toilet: with modified independence;ambulating;regular height toilet Pt Will Perform Toileting - Clothing Manipulation and hygiene: with modified independence;sit to/from stand Pt Will Perform Tub/Shower Transfer: Shower transfer;ambulating;3 in 1;rolling walker  OT Frequency: Min 2X/week   Barriers to D/C:            Co-evaluation              End of Session Equipment Utilized During Treatment: Gait belt;Rolling walker;Other (comment) (boot) Nurse Communication: Mobility status  Activity Tolerance: Patient tolerated treatment well Patient left: in bed;with call bell/phone within reach;with family/visitor present   Time: [id number] OT Time Calculation (min): 47 min Charges:  OT General Charges $OT Visit: 1 Procedure OT Evaluation $OT Eval Low Complexity: 1 Procedure OT Treatments $Self Care/Home Management : 23-37 mins G-Codes:    Merri Ray Tierany Appleby 04-11-16, 4:01 PM  Hulda Humphrey OTR/L [phone]

## 2016-03-22 NOTE — Discharge Instructions (Signed)
Weight bearing as tolerated with walking boot. Keep leg elevated with ice as much as possible Continue 325 mg Aspirin daily for 30 days DVT prophylaxis.  Resume daily if already taking daily before surgery. Leave dressings in place until follow up in the office with Dr. Alain Marion.

## 2016-03-22 NOTE — Progress Notes (Signed)
   Assessment: 1 Day Post-Op  S/P Procedure(s) (LRB): INTRAMEDULLARY (IM) NAIL TIBIAL (Right) by Dr. Ernesta Amble. Murphy on 03/21/16  Active Problems:   Hyperlipidemia   Essential hypertension   Hypothyroidism   Major depression in remission ([place])   Closed fracture of right tibia and fibula   AKI (acute kidney injury) ([place])  AFVSN.  Urinating. Doing well from an orthopedic perspective.  HHPT recommended by PT.  Pain controlled.  Has been OOB with Therapy using walker.   Plan: Up with therapy Walking boot for comfort while ambulating. Keep leg elevated with ice as much as possible to reduce pain and swelling.  Weight Bearing: Weight Bearing as Tolerated (WBAT) with walking boot Dressings: Reinforce prn.  VTE prophylaxis: Aspirin and Lovenox, SCDs, ambulation.  Continue ASA 325 mg on d/c for DVT prophylaxis. Dispo: Home with HHPT likely 1-2 days.  Okay for d/c from an ortho perspective when cleared by PT and Medicine.  Subjective: Patient reports pain as moderate. Pain controlled with PO meds.  Tolerating diet.  Urinating.  No CP, SOB.  OOB.  Objective:   VITALS:   Vitals:   03/21/16 1500 03/21/16 2049 03/22/16 0100 03/22/16 0523  BP: 107/65 125/66  128/69  Pulse: 86 73  64  Resp: 16 16  18   Temp: 98.2 F (36.8 C) (!) 100.5 F (38.1 C) 98.8 F (37.1 C) 99.6 F (37.6 C)  TempSrc: Oral Oral Oral Tympanic  SpO2: 98% 94%  98%  Weight:      Height:       CBC Latest Ref Rng & Units 03/21/2016 03/20/2016 11/30/2015  WBC 4.0 - 10.5 K/uL 7.8 8.0 4.2  Hemoglobin 13.0 - 17.0 g/dL 13.7 15.2 16.8  Hematocrit 39.0 - 52.0 % 40.7 44.6 49.7  Platelets 150 - 400 K/uL 191 193 227   BMP Latest Ref Rng & Units 03/21/2016 03/20/2016 03/20/2016  Glucose 65 - 99 mg/dL 105(H) - 121(H)  BUN 6 - 20 mg/dL 35(H) - 25(H)  Creatinine 0.61 - 1.24 mg/dL 1.83(H) 1.16 0.96  Sodium 135 - 145 mmol/L 139 - 139  Potassium 3.5 - 5.1 mmol/L 4.2 - 3.7  Chloride 101 - 111 mmol/L 104 - 104  CO2 22 - 32  mmol/L 27 - 26  Calcium 8.9 - 10.3 mg/dL 9.4 - 9.8   Intake/Output      10/26 0701 - 10/27 0700 10/27 0701 - 10/28 0700   P.O. 720    I.V. (mL/kg) 1925 (18.5)    IV Piggyback 100    Total Intake(mL/kg) 2745 (26.3)    Urine (mL/kg/hr) 1950 (0.8)    Blood 100 (0)    Total Output 2050     Net +695            Physical Exam: General: NAD.  Supine in bed.  Pleasant, conversant. Resp: No increased wob Cardio: regular rate and rhythm ABD protuberant, soft Neurologically intact MSK Right leg: Neurovascularly intact Sensation intact distally Foot warm Dorsiflexion/Plantar flexion intact Incision: dressing C/D/I  Charna Elizabeth Martensen III 03/22/2016, 7:23 AM

## 2016-03-22 NOTE — Progress Notes (Signed)
Physical Therapy Treatment Patient Details Name: [name] [name] MRN: [id number] DOB: [birth year] Today's Date: 03/22/2016    History of Present Illness 65 yo admitted with RLE tib/fib fx after locking himself in the backyard and trying to jump the fence. PMHx: HTN, HLD, hypothyroidism, depression, ankle fx from roller skating    PT Comments    Pt presented supine in bed with HOB elevated, awake and willing to participate in therapy session. Pt's wife was present throughout session as well. Pt very limited in ambulation secondary to pain. Pt would continue to benefit from skilled physical therapy services at this time while admitted and after d/c to address his limitations in order to improve his overall safety and independence with functional mobility.   Follow Up Recommendations  Home health PT;Supervision/Assistance - 24 hour     Equipment Recommendations  3in1 (PT);Wheelchair (measurements PT);Wheelchair cushion (measurements PT);Other (comment) (w/c with elevating leg rests; pt states he will borrow a RW)    Recommendations for Other Services       Precautions / Restrictions Precautions Precautions: Fall Required Braces or Orthoses: Other Brace/Splint Other Brace/Splint: CAM walking boot Restrictions Weight Bearing Restrictions: Yes RLE Weight Bearing: Weight bearing as tolerated (per ortho note on 03/22/16)    Mobility  Bed Mobility Overal bed mobility: Needs Assistance Bed Mobility: Supine to Sit     Supine to sit: Min assist;HOB elevated     General bed mobility comments: pt required increased time, use of bed rails and min A with movement of R LE  Transfers Overall transfer level: Needs assistance Equipment used: Rolling walker (2 wheeled) Transfers: Sit to/from Stand Sit to Stand: Min assist         General transfer comment: pt required increased time, VC'ing for bilateral hand placement and min A to achieve full standing from  bed  Ambulation/Gait Ambulation/Gait assistance: Min assist Ambulation Distance (Feet): 4 Feet Assistive device: Rolling walker (2 wheeled) Gait Pattern/deviations: Step-to pattern;Decreased step length - left;Decreased stance time - right;Decreased weight shift to right;Antalgic (hop-to pattern) Gait velocity: decreased Gait velocity interpretation: Below normal speed for age/gender General Gait Details: pt required VC'ing for sequencing and safety with RW. pt very limited secondary to pain.   Stairs            Wheelchair Mobility    Modified Rankin (Stroke Patients Only)       Balance Overall balance assessment: Needs assistance Sitting-balance support: Feet supported;Bilateral upper extremity supported Sitting balance-Leahy Scale: Poor     Standing balance support: During functional activity;Bilateral upper extremity supported Standing balance-Leahy Scale: Poor Standing balance comment: pt reliant on bilateral UEs on RW                    Cognition Arousal/Alertness: Awake/alert Behavior During Therapy: WFL for tasks assessed/performed Overall Cognitive Status: Within Functional Limits for tasks assessed                      Exercises      General Comments        Pertinent Vitals/Pain Pain Assessment: Faces Faces Pain Scale: Hurts whole lot Pain Location: R LE Pain Descriptors / Indicators: Grimacing;Guarding;Moaning Pain Intervention(s): Monitored during session;Repositioned;Ice applied    Home Living                      Prior Function            PT Goals (current goals can now be found  in the care plan section) Acute Rehab PT Goals Patient Stated Goal: return home PT Goal Formulation: With patient/family Time For Goal Achievement: 03/28/16 Potential to Achieve Goals: Good Progress towards PT goals: Progressing toward goals    Frequency    Min 5X/week      PT Plan Current plan remains appropriate     Co-evaluation             End of Session Equipment Utilized During Treatment: Gait belt;Other (comment) (CAM boot) Activity Tolerance: Patient limited by pain Patient left: in chair;with call bell/phone within reach;with family/visitor present;with nursing/sitter in room (nurse in room administering meds)     Time: [id number] PT Time Calculation (min) (ACUTE ONLY): 13 min  Charges:  $Therapeutic Activity: 8-22 mins                    G CodesClearnce Sorrel Stevie Ertle 04-21-2016, 10:57 AM Sherie Don, PT, DPT [phone]

## 2016-03-22 NOTE — Progress Notes (Signed)
PROGRESS NOTE    [name] [name]  [id number] DOB: 12/14/1950 DOA: 03/20/2016 PCP: Alesia Richards, MD   Brief Narrative:   [name] [name]  is a 65 y.o. male, With past medical history of hypertension, hyperlipidemia, hypoaldosteronism, hypothyroidism, depression, patient presents secondary to right leg injury, patient reports he locked himself in the backyard, was trying to jump 4 foot fence, he landed on his right leg, reports so than right leg pain upon touching the ground, unable to walk on it, he denies any syncope, presyncope, loss of consciousness, chest pain, shortness of breath. - IN ED workup significant for right tibia/fibula fracture, labs with no abnormalities, chest x-ray with no acute findings, EKG nonacute, discussed with Dr. Percell Miller, he will operate tomorrow morning, like for the  patient to be transferred to [facility].  Assessment & Plan:   Active Problems:   Hyperlipidemia   Essential hypertension   Hypothyroidism   Major depression in remission ([place])   Closed fracture of right tibia and fibula   AKI (acute kidney injury) ([place])   Right tibia and fibula fracture - Secondary to fall after he was trying to jump of the fence as he locked himself in the backyard, no syncope, no near syncope dizziness or loss of consciousness. - S/P Intramedullary Nail Tibial 10-26. -bowel regimen.  -PT, OT consulted.   AKI;  Cr increase to 1.8.  Bladder scan, foley if urine retention.  IV fluid.  Hold ARB and HCTZ  improved with IV fluids. Cr has decreased to 1.1.   Acute blood loss anemia, expected post surgery. Repeat hb in am.   Mild fever;  Suspect atelectasis.  Monitor overnight.  Incentive spirometry.  If spike feve might need chest x ray and UA.   Hypertension - Hold irbesartan and HCTZ due to AKI.  -continue with metoprolol.   Hyperthyroidism  - Continue Synthroid   Depression - Continue home medication  Hyperlipidemia - Continue home  medication     DVT prophylaxis: Lovenox Code Status: full code Family Communication: wife at bedside.  Disposition Plan: remain inpatient post op care and AKI.    Consultants:   Percell Miller   Procedures:  INTRAMEDULLARY (IM) NAIL TIBIAL   Antimicrobials:  none   Subjective: Sitting in the chair.  Pain is controlled.  No BM yet. Denies cough   Objective: Vitals:   03/21/16 1500 03/21/16 2049 03/22/16 0100 03/22/16 0523  BP: 107/65 125/66  128/69  Pulse: 86 73  64  Resp: 16 16  18   Temp: 98.2 F (36.8 C) (!) 100.5 F (38.1 C) 98.8 F (37.1 C) 99.6 F (37.6 C)  TempSrc: Oral Oral Oral Tympanic  SpO2: 98% 94%  98%  Weight:      Height:        Intake/Output Summary (Last 24 hours) at 03/22/16 1259 Last data filed at 03/22/16 0700  Gross per 24 hour  Intake             1145 ml  Output             1450 ml  Net             -305 ml   Filed Weights   03/20/16 1313  Weight: 104.3 kg (230 lb)    Examination:  General exam: Appears calm and comfortable  Respiratory system: Clear to auscultation. Respiratory effort normal. Cardiovascular system: S1 & S2 heard, RRR. No JVD, murmurs, rubs, gallops or clicks. No pedal edema. Gastrointestinal system: Abdomen is nondistended, soft  and nontender. No organomegaly or masses felt. Normal bowel sounds heard. Central nervous system: Alert and oriented. No focal neurological deficits. Extremities: Symmetric 5 x 5 power. Right LE with boot cam  Skin: No rashes, lesions or ulcers Psychiatry: Judgement and insight appear normal. Mood & affect appropriate.     Data Reviewed: I have personally reviewed following labs and imaging studies  CBC:  Recent Labs Lab 03/20/16 1055 03/21/16 0546 03/22/16 0810  WBC 8.0 7.8 6.3  NEUTROABS 6.3  --   --   HGB 15.2 13.7 11.1*  HCT 44.6 40.7 33.3*  MCV 90.5 91.1 93.5  PLT 193 191 [id number]*   Basic Metabolic Panel:  Recent Labs Lab 03/20/16 1055 03/20/16 1921 03/21/16 0546  03/22/16 0810  NA 139  --  139 139  K 3.7  --  4.2 4.3  CL 104  --  104 103  CO2 26  --  27 29  GLUCOSE 121*  --  105* 131*  BUN 25*  --  35* 21*  CREATININE 0.96 1.16 1.83* 1.00  CALCIUM 9.8  --  9.4 8.6*   GFR: Estimated Creatinine Clearance: 86.3 mL/min (by C-G formula based on SCr of 1 mg/dL). Liver Function Tests: No results for input(s): AST, ALT, ALKPHOS, BILITOT, PROT, ALBUMIN in the last 168 hours. No results for input(s): LIPASE, AMYLASE in the last 168 hours. No results for input(s): AMMONIA in the last 168 hours. Coagulation Profile: No results for input(s): INR, PROTIME in the last 168 hours. Cardiac Enzymes: No results for input(s): CKTOTAL, CKMB, CKMBINDEX, TROPONINI in the last 168 hours. BNP (last 3 results) No results for input(s): PROBNP in the last 8760 hours. HbA1C: No results for input(s): HGBA1C in the last 72 hours. CBG:  Recent Labs Lab 03/20/16 1821  GLUCAP 116*   Lipid Profile: No results for input(s): CHOL, HDL, LDLCALC, TRIG, CHOLHDL, LDLDIRECT in the last 72 hours. Thyroid Function Tests: No results for input(s): TSH, T4TOTAL, FREET4, T3FREE, THYROIDAB in the last 72 hours. Anemia Panel: No results for input(s): VITAMINB12, FOLATE, FERRITIN, TIBC, IRON, RETICCTPCT in the last 72 hours. Sepsis Labs: No results for input(s): PROCALCITON, LATICACIDVEN in the last 168 hours.  Recent Results (from the past 240 hour(s))  Surgical pcr screen     Status: Abnormal   Collection Time: 03/20/16 12:44 AM  Result Value Ref Range Status   MRSA, PCR NEGATIVE NEGATIVE Final   Staphylococcus aureus POSITIVE (A) NEGATIVE Final    Comment:        The Xpert SA Assay (FDA approved for NASAL specimens in patients over 59 years of age), is one component of a comprehensive surveillance program.  Test performance has been validated by [facility] for patients greater than or equal to 64 year old. It is not intended to diagnose infection nor to guide or  monitor treatment.          Radiology Studies: Dg Tibia/fibula Right  Result Date: 03/21/2016 CLINICAL DATA:  Intra medullary tibial nail. Initial encounter. EXAM: RIGHT TIBIA AND FIBULA - 2 VIEW; DG C-ARM 61-120 MIN COMPARISON:  Yesterday FINDINGS: Fluoroscopy for tibial nail fixation. No periprosthetic fracture. Much improved alignment of tibial diaphysis fracture fragments. Comminuted fibular diaphysis fracture also shows interval reduction. There is a remote and healed distal fibula diaphysis fracture. IMPRESSION: Fluoroscopy for tibial and fibular fracture reduction and tibial fixation. Electronically Signed   By: Monte Fantasia M.D.   On: 03/21/2016 09:31   Dg Tibia/fibula Right Port  Result Date:  03/21/2016 CLINICAL DATA:  Status post fixation of right tibia fracture. EXAM: PORTABLE RIGHT TIBIA AND FIBULA - 2 VIEW COMPARISON:  03/20/2016 tibia/ fibula radiographs. 03/21/2016 intraoperative fluoroscopic images. FINDINGS: Comminuted diaphyseal fractures of the tibia and fibula are again demonstrated. Antegrade intramedullary nail fixation with proximal and distal interlocking screws across the tibia fractures with improved alignment from the preoperative study, unchanged from the intraoperative images. Fibular fracture also demonstrates much improved alignment compared to the preoperative study. There is stable to slightly increased lateral angulation of a fragment at the level of the mid fibular diaphysis compared to the intraoperative study. Slightly less than 1 shaft width posterior displacement of a fibular fragment at the level of the mid diaphysis is similar to the intraoperative study. Slight posterior displacement of the main distal fragment is unchanged from the intraoperative study. An old, healed distal fibular diaphyseal fracture is again noted. The knee and ankle are located. There is diffuse soft tissue swelling in the lower leg. IMPRESSION: Comminuted tibia and fibular  diaphyseal fractures with improved alignment following reduction and tibial fixation as above. Electronically Signed   By: Logan Bores M.D.   On: 03/21/2016 10:08   Dg C-arm 1-60 Min  Result Date: 03/21/2016 CLINICAL DATA:  Intra medullary tibial nail. Initial encounter. EXAM: RIGHT TIBIA AND FIBULA - 2 VIEW; DG C-ARM 61-120 MIN COMPARISON:  Yesterday FINDINGS: Fluoroscopy for tibial nail fixation. No periprosthetic fracture. Much improved alignment of tibial diaphysis fracture fragments. Comminuted fibular diaphysis fracture also shows interval reduction. There is a remote and healed distal fibula diaphysis fracture. IMPRESSION: Fluoroscopy for tibial and fibular fracture reduction and tibial fixation. Electronically Signed   By: Monte Fantasia M.D.   On: 03/21/2016 09:31        Scheduled Meds: . aspirin  325 mg Oral Daily  . B-complex with vitamin C  1 tablet Oral Daily  . Chlorhexidine Gluconate Cloth  6 each Topical Daily  . docusate sodium  100 mg Oral BID  . enoxaparin (LOVENOX) injection  40 mg Subcutaneous Q24H  . ezetimibe  10 mg Oral Daily  . levothyroxine  50 mcg Oral QAC breakfast  . metoprolol tartrate  12.5 mg Oral BID  . multivitamin with minerals  1 tablet Oral Daily  . mupirocin ointment  1 application Nasal BID  . omega-3 acid ethyl esters  2 g Oral TID  . senna  1 tablet Oral BID  . sertraline  100 mg Oral Daily  . vitamin C  1,000 mg Oral Daily   Continuous Infusions: . sodium chloride 100 mL/hr at 03/21/16 2206     LOS: 2 days    Time spent: 35 minutes.    Elmarie Shiley, MD [facility] Pager [phone]  If 7PM-7AM, please contact night-coverage [email or web address] Password [email or web address] 03/22/2016, 12:59 PM

## 2016-03-23 DIAGNOSIS — G4733 Obstructive sleep apnea (adult) (pediatric): Secondary | ICD-10-CM | POA: Diagnosis not present

## 2016-03-23 DIAGNOSIS — I10 Essential (primary) hypertension: Secondary | ICD-10-CM | POA: Diagnosis not present

## 2016-03-23 DIAGNOSIS — G4730 Sleep apnea, unspecified: Secondary | ICD-10-CM | POA: Diagnosis not present

## 2016-03-23 DIAGNOSIS — R269 Unspecified abnormalities of gait and mobility: Secondary | ICD-10-CM | POA: Diagnosis not present

## 2016-03-23 DIAGNOSIS — N179 Acute kidney failure, unspecified: Secondary | ICD-10-CM | POA: Diagnosis not present

## 2016-03-23 DIAGNOSIS — E039 Hypothyroidism, unspecified: Secondary | ICD-10-CM | POA: Diagnosis not present

## 2016-03-23 LAB — CBC
HEMATOCRIT: 31 % — AB (ref 39.0–52.0)
Hemoglobin: 10.2 g/dL — ABNORMAL LOW (ref 13.0–17.0)
MCH: 30.4 pg (ref 26.0–34.0)
MCHC: 32.9 g/dL (ref 30.0–36.0)
MCV: 92.5 fL (ref 78.0–100.0)
PLATELETS: 146 10*3/uL — AB (ref 150–400)
RBC: 3.35 MIL/uL — ABNORMAL LOW (ref 4.22–5.81)
RDW: 13.9 % (ref 11.5–15.5)
WBC: 7.2 10*3/uL (ref 4.0–10.5)

## 2016-03-23 LAB — BASIC METABOLIC PANEL
ANION GAP: 6 (ref 5–15)
BUN: 14 mg/dL (ref 6–20)
CALCIUM: 8.7 mg/dL — AB (ref 8.9–10.3)
CO2: 29 mmol/L (ref 22–32)
CREATININE: 0.85 mg/dL (ref 0.61–1.24)
Chloride: 105 mmol/L (ref 101–111)
GLUCOSE: 119 mg/dL — AB (ref 65–99)
Potassium: 4.3 mmol/L (ref 3.5–5.1)
Sodium: 140 mmol/L (ref 135–145)

## 2016-03-23 MED ORDER — POLYETHYLENE GLYCOL 3350 17 G PO PACK
17.0000 g | PACK | Freq: Two times a day (BID) | ORAL | Status: DC
  Administered 2016-03-23: 17 g via ORAL
  Filled 2016-03-23: qty 1

## 2016-03-23 MED ORDER — POLYETHYLENE GLYCOL 3350 17 G PO PACK: 17.0000 g | PACK | Freq: Two times a day (BID) | ORAL | 0 refills | Status: DC

## 2016-03-23 MED ORDER — SENNA 8.6 MG PO TABS: 1.0000 | ORAL_TABLET | Freq: Two times a day (BID) | ORAL | 0 refills | Status: DC

## 2016-03-23 NOTE — Progress Notes (Signed)
Patient ID: [name] [name], male   DOB: 12/24/1950, 65 y.o.   MRN: [id number]     Subjective:  Patient reports pain as mild.  Patient in bed and in no acute distress.    Objective:   VITALS:   Vitals:   03/22/16 0523 03/22/16 1500 03/22/16 1933 03/23/16 0514  BP: 128/69 134/64 (!) 151/71 128/66  Pulse: 64 68 87 67  Resp: 18 18 18 18   Temp: 99.6 F (37.6 C) 98.8 F (37.1 C) (!) 100.4 F (38 C) 98.5 F (36.9 C)  TempSrc: Tympanic Oral Oral Oral  SpO2: 98% 99% 98% 100%  Weight:      Height:        ABD soft Sensation intact distally Dorsiflexion/Plantar flexion intact Incision: dressing C/D/I and no drainage  EHL FHL firing  Lab Results  Component Value Date   WBC 7.2 03/23/2016   HGB 10.2 (L) 03/23/2016   HCT 31.0 (L) 03/23/2016   MCV 92.5 03/23/2016   PLT 146 (L) 03/23/2016   BMET    Component Value Date/Time   NA 140 03/23/2016 0306   K 4.3 03/23/2016 0306   CL 105 03/23/2016 0306   CO2 29 03/23/2016 0306   GLUCOSE 119 (H) 03/23/2016 0306   BUN 14 03/23/2016 0306   CREATININE 0.85 03/23/2016 0306   CREATININE 1.27 (H) 11/30/2015 0942   CALCIUM 8.7 (L) 03/23/2016 0306   GFRNONAA >60 03/23/2016 0306   GFRNONAA 59 (L) 11/30/2015 0942   GFRAA >60 03/23/2016 0306   GFRAA 69 11/30/2015 0942     Assessment/Plan: 2 Days Post-Op   Active Problems:   Hyperlipidemia   Essential hypertension   Hypothyroidism   Major depression in remission ([place])   Closed fracture of right tibia and fibula   AKI (acute kidney injury) ([place])   Advance diet Up with therapy Discharge home with home health NWB right lower  Dry dressing PRN   Remonia Richter 03/23/2016, 10:11 AM   Marchia Bond, MD Cell [phone]

## 2016-03-23 NOTE — Progress Notes (Signed)
Physical Therapy Treatment Patient Details Name: [name] [name] MRN: [id number] DOB: [birth year] Today's Date: 03/23/2016    History of Present Illness 65 yo admitted with RLE tib/fib fx after locking himself in the backyard and trying to jump the fence. PMHx: HTN, HLD, hypothyroidism, depression, ankle fx from roller skating    PT Comments    Pt presented supine in bed with HOB elevated, awake and willing to participate in therapy session. Pt making good progress since previous session with ambulation and successfully completed stair training. Pt's wife was present throughout and PT gave pt handout for stair instruction. Pt would continue to benefit from skilled physical therapy services at this time while admitted and after d/c to address his limitations in order to improve his overall safety and independence with functional mobility.   Follow Up Recommendations  Home health PT;Supervision/Assistance - 24 hour     Equipment Recommendations  3in1 (PT);Wheelchair (measurements PT);Wheelchair cushion (measurements PT);Other (comment)    Recommendations for Other Services       Precautions / Restrictions Precautions Precautions: Fall Required Braces or Orthoses: Other Brace/Splint Other Brace/Splint: CAM walking boot Restrictions Weight Bearing Restrictions: Yes RLE Weight Bearing: Weight bearing as tolerated    Mobility  Bed Mobility Overal bed mobility: Needs Assistance Bed Mobility: Supine to Sit     Supine to sit: Min assist;HOB elevated     General bed mobility comments: pt required increased time, use of bed rails and min A with movement of R LE  Transfers Overall transfer level: Needs assistance Equipment used: Rolling walker (2 wheeled) Transfers: Sit to/from Stand Sit to Stand: Min guard         General transfer comment: Pt required verbal cues for safe hand placement. Pt required increased time.  Ambulation/Gait Ambulation/Gait assistance: Min  guard Ambulation Distance (Feet): 30 Feet (30' with multiple standing rest breaks secondary to fatigue) Assistive device: Rolling walker (2 wheeled) Gait Pattern/deviations: Step-to pattern;Decreased step length - left;Decreased stance time - right;Decreased weight shift to right;Antalgic Gait velocity: decreased Gait velocity interpretation: Below normal speed for age/gender General Gait Details: pt required VC'ing for sequencing with RW and multiple standing rest breaks secondary to fatigue   Stairs Stairs: Yes Stairs assistance: Min assist Stair Management: No rails;Step to pattern;Backwards;With walker Number of Stairs: 2 General stair comments: pt ascended with L LE leading and descended with R LE leading. PT provided min A at RW for stability  Wheelchair Mobility    Modified Rankin (Stroke Patients Only)       Balance Overall balance assessment: Needs assistance Sitting-balance support: Feet supported;No upper extremity supported Sitting balance-Leahy Scale: Fair     Standing balance support: During functional activity;Bilateral upper extremity supported Standing balance-Leahy Scale: Poor Standing balance comment: pt reliant on bilateral UEs on RW                    Cognition Arousal/Alertness: Awake/alert Behavior During Therapy: WFL for tasks assessed/performed Overall Cognitive Status: Within Functional Limits for tasks assessed                      Exercises      General Comments        Pertinent Vitals/Pain Pain Assessment: No/denies pain Pain Intervention(s): Monitored during session    Home Living                      Prior Function  PT Goals (current goals can now be found in the care plan section) Acute Rehab PT Goals Patient Stated Goal: return home PT Goal Formulation: With patient/family Time For Goal Achievement: 03/28/16 Potential to Achieve Goals: Good Progress towards PT goals: Progressing toward  goals    Frequency    Min 5X/week      PT Plan Current plan remains appropriate    Co-evaluation             End of Session Equipment Utilized During Treatment: Gait belt;Other (comment) (CAM boot) Activity Tolerance: Patient limited by fatigue;Patient limited by pain Patient left: in chair;with call bell/phone within reach;with family/visitor present     Time: 1020-1054 PT Time Calculation (min) (ACUTE ONLY): 34 min  Charges:  $Gait Training: 23-37 mins                    G CodesClearnce Sorrel Brookie Wayment 04/12/16, 12:32 PM Sherie Don, [place], DPT [phone]

## 2016-03-23 NOTE — Progress Notes (Signed)
Occupational Therapy Treatment Patient Details Name: [name] [name] MRN: [id number] DOB: 01/25/1951 Today's Date: 03/23/2016    History of present illness 65 yo admitted with RLE tib/fib fx after locking himself in the backyard and trying to jump the fence. PMHx: HTN, HLD, hypothyroidism, depression, ankle fx from roller skating   OT comments  Pt with good progress with OT this session. Continues to require min guard assist for functional mobility. Progressed to min assist for LB ADL. Pt with improved activity tolerance this date additionally. Pt eager to get back to active lifestyle. Completed education with pt and family concerning dressing techniques, car transfer, toilet transfer, shower transfer, energy conservation, and fall prevention. Pt and family verbalize and demonstrate understanding and all questions/concerns addressed. Pt has good family support at home. D/C plan remains appropriate. Pt has no further acute OT needs. OT will sign off.   Follow Up Recommendations  No OT follow up;Supervision - Intermittent    Equipment Recommendations  3 in 1 bedside comode       Precautions / Restrictions Precautions Precautions: Fall Required Braces or Orthoses: Other Brace/Splint Other Brace/Splint: CAM walking boot Restrictions Weight Bearing Restrictions: Yes RLE Weight Bearing: Weight bearing as tolerated       Mobility Bed Mobility Overal bed mobility: Needs Assistance Bed Mobility: Supine to Sit     Supine to sit: Supervision;HOB elevated     General bed mobility comments: Increased time and use of bed rails with no physical assist needed.  Transfers Overall transfer level: Needs assistance Equipment used: Rolling walker (2 wheeled) Transfers: Sit to/from Stand Sit to Stand: Min guard         General transfer comment: Pt required verbal cues for safe hand placement. Pt required increased time.    Balance Overall balance assessment: Needs  assistance Sitting-balance support: Feet supported;No upper extremity supported Sitting balance-Leahy Scale: Good     Standing balance support: During functional activity;Bilateral upper extremity supported Standing balance-Leahy Scale: Poor Standing balance comment: pt reliant on bilateral UEs on RW                   ADL Overall ADL's : Needs assistance/impaired                 Upper Body Dressing : Modified independent;Sitting   Lower Body Dressing: Minimal assistance;Sit to/from stand   Toilet Transfer: BSC;Ambulation;Min guard       Tub/ Shower Transfer: Walk-in shower;Minimal assistance;Ambulation;3 in 1   Functional mobility during ADLs: Rolling walker;Min guard (Supervision for ambulation; min assist shower transfer) General ADL Comments: Pt and family educated on shower transfer, energy conservation, fall prevention, car transfer, and dressing techniques.                Cognition   Behavior During Therapy: WFL for tasks assessed/performed Overall Cognitive Status: Within Functional Limits for tasks assessed                                    Pertinent Vitals/ Pain       Pain Assessment: No/denies pain Pain Intervention(s): Monitored during session  Home Living                                              Frequency  Min 2X/week  Progress Toward Goals  OT Goals(current goals can now be found in the care plan section)  Progress towards OT goals: Progressing toward goals  Acute Rehab OT Goals Patient Stated Goal: return home OT Goal Formulation: With patient/family Time For Goal Achievement: 03/29/16 Potential to Achieve Goals: Good ADL Goals Pt Will Perform Grooming: with supervision;standing Pt Will Transfer to Toilet: with modified independence;ambulating;regular height toilet Pt Will Perform Toileting - Clothing Manipulation and hygiene: with modified independence;sit to/from stand Pt Will  Perform Tub/Shower Transfer: Shower transfer;ambulating;3 in 1;rolling walker  Plan Discharge plan remains appropriate       End of Session Equipment Utilized During Treatment: Gait belt;Rolling walker;Other (comment) (boot)   Activity Tolerance Patient tolerated treatment well   Patient Left in bed;with call bell/phone within reach;with family/visitor present           Time: [id number] OT Time Calculation (min): 16 min  Charges: OT General Charges $OT Visit: 1 Procedure OT Treatments $Self Care/Home Management : 8-22 mins  Norman Herrlich, OTR/L [phone] 03/23/2016, 3:23 PM

## 2016-03-23 NOTE — Care Management Note (Signed)
Case Management Note  Patient Details  Name: KORTEZ HAAGEN MRN: [id number] Date of Birth: [birth year]  Subjective/Objective:                  RLE tib/fib fx Action/Plan: Discharge planning Expected Discharge Date:   (unknown)               Expected Discharge Plan:  [place]  In-House Referral:     Discharge planning Services  CM Consult  Post Acute Care Choice:  Home Health Choice offered to:  Patient  DME Arranged:  3-N-1, Lightweight manual wheelchair with seat cushion DME Agency:  [place]:  PT [facility] Agency:  [place]  Status of Service:  Completed, signed off  If discussed at [place] of Stay Meetings, dates discussed:    Additional Comments: CM notes pt already set up with [facility] for HHPT.  CM notified [facility] DME rep, Reggie to please deliver the 3n1 and wheelchair and cushion to room prior to discharge.  No other CM needs were communicated. Dellie Catholic, RN 03/23/2016, 4:11 PM

## 2016-03-23 NOTE — Discharge Summary (Signed)
Physician Discharge Summary  [name] [name] [id number] DOB: 03/13/1951 DOA: 03/20/2016  PCP: Alesia Richards, MD  Admit date: 03/20/2016 Discharge date: 03/23/2016  Admitted From: home  Disposition: home   Recommendations for Outpatient Follow-up:  1. Follow up with PCP in 1-2 weeks 2. Please obtain BMP/CBC in one week  Home Health: yes  Equipment/Devices: Walker, wheel chair.   Discharge Condition: stable.  CODE STATUS: Full code.  Diet recommendation: Heart Healthy   Brief/Interim Summary: [name] [name] a 65 y.o.male,With past medical history of hypertension, hyperlipidemia, hypoaldosteronism, hypothyroidism, depression, patient presents secondary to right leg injury, patient reports he locked himself in the backyard, was trying to jump 4 foot fence, he landed on his right leg, reports so than right leg pain upon touching the ground, unable to walk on it, he denies any syncope, presyncope, loss of consciousness, chest pain, shortness of breath. - IN ED workup significant for right tibia/fibula fracture, labs with no abnormalities, chest x-ray with no acute findings, EKG nonacute, discussed with Dr. Percell Miller, he will operate tomorrow morning, like for the patient to be transferred to [facility].  Assessment & Plan: Right tibia and fibula fracture - Secondary to fall after he was trying to jump of the fence as he locked himself in the backyard, no syncope, no near syncope dizziness or loss of consciousness. - S/P Intramedullary Nail Tibial 10-26. -bowel regimen.  -PT, OT consulted.  - stable to be discharge today   AKI;  Cr increase to 1.8.  Bladder scan, foley if urine retention.  IV fluid.  Hold ARB and HCTZ  improved with IV fluids. Cr has decreased to 1.1.   Acute blood loss anemia, expected post surgery. Hb at 10. No evidence of active bleeding.  Needs repeat outpatient.   Mild fever;  Suspect atelectasis.  Monitor overnight.  Incentive spirometry.   No leukocytosis.   Hypertension - Hold irbesartan and HCTZ due to AKI.  -continue with metoprolol.   Hyperthyroidism  - Continue Synthroid   Depression - Continue home medication  Hyperlipidemia - Continue home medication    Discharge Diagnoses:  Active Problems:   Hyperlipidemia   Essential hypertension   Hypothyroidism   Major depression in remission ([place])   Closed fracture of right tibia and fibula   AKI (acute kidney injury) [facility])    Discharge Instructions  Discharge Instructions    Diet - low sodium heart healthy    Complete by:  As directed    Increase activity slowly    Complete by:  As directed        Medication List    STOP taking these medications   hydrochlorothiazide 25 MG tablet Commonly known as:  HYDRODIURIL   valsartan 320 MG tablet Commonly known as:  DIOVAN     TAKE these medications   aspirin 325 MG tablet Take 325 mg by mouth daily.   B COMPLEX PO Take 1 tablet by mouth See admin instructions. Takes M,W,F   docusate sodium 100 MG capsule Commonly known as:  COLACE Take 1 capsule (100 mg total) by mouth 2 (two) times daily. To prevent constipation while taking pain medication.   EPINEPHrine 0.3 mg/0.3 mL Soaj injection Commonly known as:  EPIPEN 2-PAK Inject 0.3 mLs (0.3 mg total) into the muscle once.   fenofibrate 145 MG tablet Commonly known as:  TRICOR Take 1 tablet (145 mg total) by mouth daily.   fish oil-omega-3 fatty acids 1000 MG capsule Take 2 g by mouth 3 (three) times daily.  Flax Seed Oil 1000 MG Caps Take 2,000 mg by mouth daily.   levothyroxine 50 MCG tablet Commonly known as:  SYNTHROID, LEVOTHROID Take 1 tablet (50 mcg total) by mouth daily.   MAGNESIUM PO Take 650 mg by mouth daily.   methocarbamol 500 MG tablet Commonly known as:  ROBAXIN Take 1 tablet (500 mg total) by mouth every 6 (six) hours as needed for muscle spasms.   metoprolol tartrate 25 MG tablet Commonly known as:   LOPRESSOR Take 1 tablet (25 mg total) by mouth 2 (two) times daily. What changed:  how much to take   multivitamin capsule Take by mouth.   ondansetron 4 MG tablet Commonly known as:  ZOFRAN Take 1 tablet (4 mg total) by mouth every 8 (eight) hours as needed for nausea or vomiting.   oxyCODONE-acetaminophen 5-325 MG tablet Commonly known as:  ROXICET Take 1-2 tablets by mouth every 4 (four) hours as needed for severe pain.   polyethylene glycol packet Commonly known as:  MIRALAX / GLYCOLAX Take 17 g by mouth 2 (two) times daily.   senna 8.6 MG Tabs tablet Commonly known as:  SENOKOT Take 1 tablet (8.6 mg total) by mouth 2 (two) times daily.   sertraline 100 MG tablet Commonly known as:  ZOLOFT TAKE ONE AND ONE-HALF TO TWO TABLETS DAILY AS DIRECTED FOR MOOD   SYRINGE 3CC/21GX1" 21G X 1" 3 ML Misc Use AD with testosterone Rx   testosterone cypionate 200 MG/ML injection Commonly known as:  DEPOTESTOSTERONE CYPIONATE Inject 2 mLs (400 mg total) into the muscle every 14 (fourteen) days.   vitamin C 1000 MG tablet Take 1,000 mg by mouth daily.   VITAMIN D-1000 MAX ST 1000 units tablet Generic drug:  Cholecalciferol Take by mouth.   ZETIA 10 MG tablet Generic drug:  ezetimibe TAKE 1 TABLET DAILY What changed:  See the new instructions.      Follow-up Information    MURPHY, TIMOTHY D, MD Follow up in 10 day(s).   Specialty:  Orthopedic Surgery Contact information: [address] [place] [address] [phone]          Allergies  Allergen Reactions  . Ace Inhibitors Other (See Comments)    cough  . Bee Venom Other (See Comments)    Consultations:  Dr Percell Miller    Procedures/Studies: Dg Chest 2 View  Result Date: 03/20/2016 CLINICAL DATA:  Medical clearance examination. No current complaints. EXAM: CHEST  2 VIEW COMPARISON:  PA and lateral chest 11/30/2015 and 06/30/2012 FINDINGS: The lungs are clear. Heart size is normal. No  pneumothorax or pleural effusion. Thoracic spondylosis is noted. IMPRESSION: No acute disease. Electronically Signed   By: Inge Rise M.D.   On: 03/20/2016 11:16   Dg Tibia/fibula Right  Result Date: 03/21/2016 CLINICAL DATA:  Intra medullary tibial nail. Initial encounter. EXAM: RIGHT TIBIA AND FIBULA - 2 VIEW; DG C-ARM 61-120 MIN COMPARISON:  Yesterday FINDINGS: Fluoroscopy for tibial nail fixation. No periprosthetic fracture. Much improved alignment of tibial diaphysis fracture fragments. Comminuted fibular diaphysis fracture also shows interval reduction. There is a remote and healed distal fibula diaphysis fracture. IMPRESSION: Fluoroscopy for tibial and fibular fracture reduction and tibial fixation. Electronically Signed   By: Monte Fantasia M.D.   On: 03/21/2016 09:31   Dg Tibia/fibula Right  Result Date: 03/20/2016 CLINICAL DATA:  Pt at home this a.m, and jumped from ladder over fence and landed wrong, severe pain, deformity rt lower leg EXAM: RIGHT TIBIA AND FIBULA -  2 VIEW COMPARISON:  None. FINDINGS: There are comminuted displaced fractures of the right tibia and fibula. The fractures extend from the proximal to distal diaphysis of both the tibia and fibula. There are multiple comminuted fracture components. Major tibial fracture components are displaced as much as 13 mm, with the distal primary fracture component displaced laterally. There is displacement of the fibular fracture components of a maximum of 1 full shaft width. There is slight angulation of the fractures in a posterior lateral direction. Diffuse surrounding soft tissue swelling is noted. The knee and ankle joints are normally aligned. IMPRESSION: 1. Comminuted, displaced and mildly angulated fractures of the right tibia and fibula from the mid through the distal shafts. Electronically Signed   By: Lajean Manes M.D.   On: 03/20/2016 10:19   Dg Tibia/fibula Right Port  Result Date: 03/21/2016 CLINICAL DATA:  Status  post fixation of right tibia fracture. EXAM: PORTABLE RIGHT TIBIA AND FIBULA - 2 VIEW COMPARISON:  03/20/2016 tibia/ fibula radiographs. 03/21/2016 intraoperative fluoroscopic images. FINDINGS: Comminuted diaphyseal fractures of the tibia and fibula are again demonstrated. Antegrade intramedullary nail fixation with proximal and distal interlocking screws across the tibia fractures with improved alignment from the preoperative study, unchanged from the intraoperative images. Fibular fracture also demonstrates much improved alignment compared to the preoperative study. There is stable to slightly increased lateral angulation of a fragment at the level of the mid fibular diaphysis compared to the intraoperative study. Slightly less than 1 shaft width posterior displacement of a fibular fragment at the level of the mid diaphysis is similar to the intraoperative study. Slight posterior displacement of the main distal fragment is unchanged from the intraoperative study. An old, healed distal fibular diaphyseal fracture is again noted. The knee and ankle are located. There is diffuse soft tissue swelling in the lower leg. IMPRESSION: Comminuted tibia and fibular diaphyseal fractures with improved alignment following reduction and tibial fixation as above. Electronically Signed   By: Logan Bores M.D.   On: 03/21/2016 10:08   Dg C-arm 1-60 Min  Result Date: 03/21/2016 CLINICAL DATA:  Intra medullary tibial nail. Initial encounter. EXAM: RIGHT TIBIA AND FIBULA - 2 VIEW; DG C-ARM 61-120 MIN COMPARISON:  Yesterday FINDINGS: Fluoroscopy for tibial nail fixation. No periprosthetic fracture. Much improved alignment of tibial diaphysis fracture fragments. Comminuted fibular diaphysis fracture also shows interval reduction. There is a remote and healed distal fibula diaphysis fracture. IMPRESSION: Fluoroscopy for tibial and fibular fracture reduction and tibial fixation. Electronically Signed   By: Monte Fantasia M.D.   On:  03/21/2016 09:31     Subjective: Feeling well, no significant cough   Discharge Exam: Vitals:   03/22/16 1933 03/23/16 0514  BP: (!) 151/71 128/66  Pulse: 87 67  Resp: 18 18  Temp: (!) 100.4 F (38 C) 98.5 F (36.9 C)   Vitals:   03/22/16 0523 03/22/16 1500 03/22/16 1933 03/23/16 0514  BP: 128/69 134/64 (!) 151/71 128/66  Pulse: 64 68 87 67  Resp: 18 18 18 18   Temp: 99.6 F (37.6 C) 98.8 F (37.1 C) (!) 100.4 F (38 C) 98.5 F (36.9 C)  TempSrc: Tympanic Oral Oral Oral  SpO2: 98% 99% 98% 100%  Weight:      Height:        General: Pt is alert, awake, not in acute distress Cardiovascular: RRR, S1/S2 +, no rubs, no gallops Respiratory: CTA bilaterally, no wheezing, no rhonchi Abdominal: Soft, NT, ND, bowel sounds + Extremities: no edema, no cyanosis  The results of significant diagnostics from this hospitalization (including imaging, microbiology, ancillary and laboratory) are listed below for reference.     Microbiology: Recent Results (from the past 240 hour(s))  Surgical pcr screen     Status: Abnormal   Collection Time: 03/20/16 12:44 AM  Result Value Ref Range Status   MRSA, PCR NEGATIVE NEGATIVE Final   Staphylococcus aureus POSITIVE (A) NEGATIVE Final    Comment:        The Xpert SA Assay (FDA approved for NASAL specimens in patients over 59 years of age), is one component of a comprehensive surveillance program.  Test performance has been validated by [facility] for patients greater than or equal to 65 year old. It is not intended to diagnose infection nor to guide or monitor treatment.      Labs: BNP (last 3 results) No results for input(s): BNP in the last 8760 hours. Basic Metabolic Panel:  Recent Labs Lab 03/20/16 1055 03/20/16 1921 03/21/16 0546 03/22/16 0810 03/23/16 0306  NA 139  --  139 139 140  K 3.7  --  4.2 4.3 4.3  CL 104  --  104 103 105  CO2 26  --  27 29 29   GLUCOSE 121*  --  105* 131* 119*  BUN 25*  --  35* 21*  14  CREATININE 0.96 1.16 1.83* 1.00 0.85  CALCIUM 9.8  --  9.4 8.6* 8.7*   Liver Function Tests: No results for input(s): AST, ALT, ALKPHOS, BILITOT, PROT, ALBUMIN in the last 168 hours. No results for input(s): LIPASE, AMYLASE in the last 168 hours. No results for input(s): AMMONIA in the last 168 hours. CBC:  Recent Labs Lab 03/20/16 1055 03/21/16 0546 03/22/16 0810 03/23/16 0306  WBC 8.0 7.8 6.3 7.2  NEUTROABS 6.3  --   --   --   HGB 15.2 13.7 11.1* 10.2*  HCT 44.6 40.7 33.3* 31.0*  MCV 90.5 91.1 93.5 92.5  PLT 193 191 140* 146*   Cardiac Enzymes: No results for input(s): CKTOTAL, CKMB, CKMBINDEX, TROPONINI in the last 168 hours. BNP: Invalid input(s): POCBNP CBG:  Recent Labs Lab 03/20/16 1821  GLUCAP 116*   D-Dimer No results for input(s): DDIMER in the last 72 hours. Hgb A1c No results for input(s): HGBA1C in the last 72 hours. Lipid Profile No results for input(s): CHOL, HDL, LDLCALC, TRIG, CHOLHDL, LDLDIRECT in the last 72 hours. Thyroid function studies No results for input(s): TSH, T4TOTAL, T3FREE, THYROIDAB in the last 72 hours.  Invalid input(s): FREET3 Anemia work up No results for input(s): VITAMINB12, FOLATE, FERRITIN, TIBC, IRON, RETICCTPCT in the last 72 hours. Urinalysis    Component Value Date/Time   COLORURINE YELLOW 11/30/2015 0942   APPEARANCEUR CLEAR 11/30/2015 0942   LABSPEC 1.023 11/30/2015 0942   PHURINE 7.5 11/30/2015 0942   GLUCOSEU NEGATIVE 11/30/2015 0942   HGBUR NEGATIVE 11/30/2015 0942   BILIRUBINUR NEGATIVE 11/30/2015 0942   KETONESUR NEGATIVE 11/30/2015 0942   PROTEINUR NEGATIVE 11/30/2015 0942   UROBILINOGEN 0.2 09/12/2014 1100   NITRITE NEGATIVE 11/30/2015 0942   LEUKOCYTESUR NEGATIVE 11/30/2015 0942   Sepsis Labs Invalid input(s): PROCALCITONIN,  WBC,  LACTICIDVEN Microbiology Recent Results (from the past 240 hour(s))  Surgical pcr screen     Status: Abnormal   Collection Time: 03/20/16 12:44 AM  Result Value  Ref Range Status   MRSA, PCR NEGATIVE NEGATIVE Final   Staphylococcus aureus POSITIVE (A) NEGATIVE Final    Comment:        The  Xpert SA Assay (FDA approved for NASAL specimens in patients over 13 years of age), is one component of a comprehensive surveillance program.  Test performance has been validated by [facility] for patients greater than or equal to 8 year old. It is not intended to diagnose infection nor to guide or monitor treatment.      Time coordinating discharge: Over 30 minutes  SIGNED:   Elmarie Shiley, MD  [facility] 03/23/2016, 11:28 AM Pager   If 7PM-7AM, please contact night-coverage [email or web address] Password [email or web address]

## 2016-03-26 DIAGNOSIS — S82201D Unspecified fracture of shaft of right tibia, subsequent encounter for closed fracture with routine healing: Secondary | ICD-10-CM | POA: Diagnosis not present

## 2016-03-26 DIAGNOSIS — S82401D Unspecified fracture of shaft of right fibula, subsequent encounter for closed fracture with routine healing: Secondary | ICD-10-CM | POA: Diagnosis not present

## 2016-03-26 DIAGNOSIS — I10 Essential (primary) hypertension: Secondary | ICD-10-CM | POA: Diagnosis not present

## 2016-03-26 DIAGNOSIS — W19XXXD Unspecified fall, subsequent encounter: Secondary | ICD-10-CM | POA: Diagnosis not present

## 2016-03-26 DIAGNOSIS — E785 Hyperlipidemia, unspecified: Secondary | ICD-10-CM | POA: Diagnosis not present

## 2016-03-26 DIAGNOSIS — Z7982 Long term (current) use of aspirin: Secondary | ICD-10-CM | POA: Diagnosis not present

## 2016-03-26 DIAGNOSIS — F329 Major depressive disorder, single episode, unspecified: Secondary | ICD-10-CM | POA: Diagnosis not present

## 2016-03-29 ENCOUNTER — Encounter: Payer: Self-pay | Admitting: Internal Medicine

## 2016-04-01 DIAGNOSIS — S82201D Unspecified fracture of shaft of right tibia, subsequent encounter for closed fracture with routine healing: Secondary | ICD-10-CM | POA: Diagnosis not present

## 2016-04-03 ENCOUNTER — Ambulatory Visit: Payer: Self-pay | Admitting: Internal Medicine

## 2016-04-15 ENCOUNTER — Ambulatory Visit (INDEPENDENT_AMBULATORY_CARE_PROVIDER_SITE_OTHER): Payer: PPO | Admitting: Internal Medicine

## 2016-04-15 ENCOUNTER — Encounter: Payer: Self-pay | Admitting: Internal Medicine

## 2016-04-15 ENCOUNTER — Other Ambulatory Visit: Payer: Self-pay | Admitting: *Deleted

## 2016-04-15 VITALS — BP 126/78 | HR 101 | Ht 67.0 in | Wt 225.2 lb

## 2016-04-15 DIAGNOSIS — G4733 Obstructive sleep apnea (adult) (pediatric): Secondary | ICD-10-CM | POA: Diagnosis not present

## 2016-04-15 MED ORDER — EZETIMIBE 10 MG PO TABS: 10.0000 mg | ORAL_TABLET | Freq: Every day | ORAL | 3 refills | Status: DC

## 2016-04-15 NOTE — Patient Instructions (Signed)
Order- DME [facility]- Please replace CPAP mask of choice, headgear and supplies. We can continue CPAP 13, humidifier, AirView    Dx OSA  Please call if we can help

## 2016-04-15 NOTE — Progress Notes (Signed)
HPI Male former smoker followed for OSA complicated by obesity, HBP NPSG 08/25/95- Severe obstructive sleep apnea, AHI 58/hr.   04/13/2015-65 year old male former smoker followed for obstructive sleep apnea complicated by obesity, HBP CPAP 12.5/[facility] FOLLOWS FOR: DME is [facility]-will take card to the office for download(will place order) and new supplies. Pt wears CPAP every night for about 6-8 hours. He feels well controlled with CPAP, sleeping well without breakthrough snoring.  04/15/2016-66 year old male former smoker followed for OSA, complicated by obesity, HBP CPAP 12.5/[facility] FOLLOW FOR; dme [facility] neeeds new mask and head gear. Download confirms excellent compliance and control on CPAP 13. Wife confirms he is not snoring through his machine.  ROS- see HPI Constitutional:   No-   weight loss, night sweats, fevers, chills, fatigue, lassitude. HEENT:   No-  headaches, difficulty swallowing, tooth/dental problems, sore throat,       No-  sneezing, itching, ear ache, nasal congestion, post nasal drip,  CV:  No-   chest pain, orthopnea, PND, swelling in lower extremities, anasarca, dizziness, palpitations Resp: No-   shortness of breath with exertion or at rest.              No-   productive cough,  No non-productive cough,  No- coughing up of blood.              No-   change in color of mucus.  No- wheezing.   Skin: No-   rash or lesions. GI:  No-   heartburn, indigestion, abdominal pain, nausea, vomiting,  GU: . MS:  No-   joint pain or swelling.  Neuro-     nothing unusual Psych:  No- change in mood or affect. No depression or anxiety.  No memory loss.  OBJ  General- Alert, Oriented, Affect-appropriate, Distress- none acute; overweight. + Full beard Skin- rash-none, lesions- none, excoriation- none Lymphadenopathy- none Head- atraumatic            Eyes- Gross vision intact, PERRLA, conjunctivae clear secretions            Ears- Hearing, canals-normal            Nose- Clear,  no-Septal dev, mucus, polyps, erosion, perforation             Throat- Mallampati III-IV , mucosa , drainage- none, tonsils- atrophic Neck- flexible , trachea midline, no stridor , thyroid nl, carotid no bruit Chest - symmetrical excursion , unlabored           Heart/CV- RRR , no murmur , no gallop  , no rub, nl s1 s2                           - JVD- none , edema- none, stasis changes- none, varices- none           Lung- clear to P&A, wheeze- none, cough- none , dullness-none, rub- none           Chest wall-  Abd-  Br/ Gen/ Rectal- Not done, not indicated Extrem- cyanosis- none, clubbing, none, atrophy- none, strength- nl Neuro- grossly intact to observation

## 2016-04-21 NOTE — Assessment & Plan Note (Signed)
Improved quality of life and good compliance and control on CPAP 13/[facility]. He needs replacement mask and headgear. He is able to manage around his full beard.

## 2016-04-23 DIAGNOSIS — G4730 Sleep apnea, unspecified: Secondary | ICD-10-CM | POA: Diagnosis not present

## 2016-04-23 DIAGNOSIS — R269 Unspecified abnormalities of gait and mobility: Secondary | ICD-10-CM | POA: Diagnosis not present

## 2016-04-23 DIAGNOSIS — G4733 Obstructive sleep apnea (adult) (pediatric): Secondary | ICD-10-CM | POA: Diagnosis not present

## 2016-05-01 DIAGNOSIS — S82201D Unspecified fracture of shaft of right tibia, subsequent encounter for closed fracture with routine healing: Secondary | ICD-10-CM | POA: Diagnosis not present

## 2016-05-14 DIAGNOSIS — L570 Actinic keratosis: Secondary | ICD-10-CM | POA: Diagnosis not present

## 2016-05-22 DIAGNOSIS — G4733 Obstructive sleep apnea (adult) (pediatric): Secondary | ICD-10-CM | POA: Diagnosis not present

## 2016-05-22 DIAGNOSIS — G4730 Sleep apnea, unspecified: Secondary | ICD-10-CM | POA: Diagnosis not present

## 2016-05-22 DIAGNOSIS — R269 Unspecified abnormalities of gait and mobility: Secondary | ICD-10-CM | POA: Diagnosis not present

## 2016-05-23 DIAGNOSIS — G4730 Sleep apnea, unspecified: Secondary | ICD-10-CM | POA: Diagnosis not present

## 2016-05-23 DIAGNOSIS — G4733 Obstructive sleep apnea (adult) (pediatric): Secondary | ICD-10-CM | POA: Diagnosis not present

## 2016-05-23 DIAGNOSIS — R269 Unspecified abnormalities of gait and mobility: Secondary | ICD-10-CM | POA: Diagnosis not present

## 2016-06-11 ENCOUNTER — Other Ambulatory Visit: Payer: Self-pay | Admitting: *Deleted

## 2016-06-11 MED ORDER — LEVOTHYROXINE SODIUM 50 MCG PO TABS: 50.0000 ug | ORAL_TABLET | Freq: Every day | ORAL | 1 refills | Status: DC

## 2016-06-17 ENCOUNTER — Encounter: Payer: Self-pay | Admitting: Physician Assistant

## 2016-06-17 ENCOUNTER — Ambulatory Visit (INDEPENDENT_AMBULATORY_CARE_PROVIDER_SITE_OTHER): Payer: PPO | Admitting: Physician Assistant

## 2016-06-17 VITALS — BP 140/90 | HR 64 | Temp 97.7°F | Resp 16 | Ht 67.0 in | Wt 228.0 lb

## 2016-06-17 DIAGNOSIS — M609 Myositis, unspecified: Secondary | ICD-10-CM | POA: Diagnosis not present

## 2016-06-17 DIAGNOSIS — N179 Acute kidney failure, unspecified: Secondary | ICD-10-CM

## 2016-06-17 DIAGNOSIS — I10 Essential (primary) hypertension: Secondary | ICD-10-CM

## 2016-06-17 DIAGNOSIS — E559 Vitamin D deficiency, unspecified: Secondary | ICD-10-CM | POA: Diagnosis not present

## 2016-06-17 DIAGNOSIS — S82401A Unspecified fracture of shaft of right fibula, initial encounter for closed fracture: Secondary | ICD-10-CM

## 2016-06-17 DIAGNOSIS — E039 Hypothyroidism, unspecified: Secondary | ICD-10-CM | POA: Diagnosis not present

## 2016-06-17 DIAGNOSIS — Z0000 Encounter for general adult medical examination without abnormal findings: Secondary | ICD-10-CM

## 2016-06-17 DIAGNOSIS — Z0001 Encounter for general adult medical examination with abnormal findings: Secondary | ICD-10-CM | POA: Diagnosis not present

## 2016-06-17 DIAGNOSIS — R350 Frequency of micturition: Secondary | ICD-10-CM

## 2016-06-17 DIAGNOSIS — R7309 Other abnormal glucose: Secondary | ICD-10-CM | POA: Diagnosis not present

## 2016-06-17 DIAGNOSIS — N529 Male erectile dysfunction, unspecified: Secondary | ICD-10-CM | POA: Diagnosis not present

## 2016-06-17 DIAGNOSIS — Z79899 Other long term (current) drug therapy: Secondary | ICD-10-CM | POA: Diagnosis not present

## 2016-06-17 DIAGNOSIS — R6889 Other general symptoms and signs: Secondary | ICD-10-CM

## 2016-06-17 DIAGNOSIS — S82201A Unspecified fracture of shaft of right tibia, initial encounter for closed fracture: Secondary | ICD-10-CM

## 2016-06-17 DIAGNOSIS — E782 Mixed hyperlipidemia: Secondary | ICD-10-CM | POA: Diagnosis not present

## 2016-06-17 DIAGNOSIS — F325 Major depressive disorder, single episode, in full remission: Secondary | ICD-10-CM

## 2016-06-17 DIAGNOSIS — G4733 Obstructive sleep apnea (adult) (pediatric): Secondary | ICD-10-CM

## 2016-06-17 DIAGNOSIS — E349 Endocrine disorder, unspecified: Secondary | ICD-10-CM

## 2016-06-17 DIAGNOSIS — M21372 Foot drop, left foot: Secondary | ICD-10-CM

## 2016-06-17 LAB — CBC WITH DIFFERENTIAL/PLATELET
BASOS ABS: 45 {cells}/uL (ref 0–200)
Basophils Relative: 1 %
EOS PCT: 5 %
Eosinophils Absolute: 225 cells/uL (ref 15–500)
HCT: 45.9 % (ref 38.5–50.0)
HEMOGLOBIN: 15.3 g/dL (ref 13.2–17.1)
LYMPHS ABS: 1170 {cells}/uL (ref 850–3900)
Lymphocytes Relative: 26 %
MCH: 29.8 pg (ref 27.0–33.0)
MCHC: 33.3 g/dL (ref 32.0–36.0)
MCV: 89.5 fL (ref 80.0–100.0)
MPV: 10 fL (ref 7.5–12.5)
Monocytes Absolute: 540 cells/uL (ref 200–950)
Monocytes Relative: 12 %
NEUTROS PCT: 56 %
Neutro Abs: 2520 cells/uL (ref 1500–7800)
Platelets: 191 10*3/uL (ref 140–400)
RBC: 5.13 MIL/uL (ref 4.20–5.80)
RDW: 14.6 % (ref 11.0–15.0)
WBC: 4.5 10*3/uL (ref 3.8–10.8)

## 2016-06-17 LAB — TSH: TSH: 1.15 m[IU]/L (ref 0.40–4.50)

## 2016-06-17 LAB — URINALYSIS, ROUTINE W REFLEX MICROSCOPIC
BILIRUBIN URINE: NEGATIVE
Glucose, UA: NEGATIVE
HGB URINE DIPSTICK: NEGATIVE
KETONES UR: NEGATIVE
Leukocytes, UA: NEGATIVE
NITRITE: NEGATIVE
Protein, ur: NEGATIVE
Specific Gravity, Urine: 1.016 (ref 1.001–1.035)
pH: 7.5 (ref 5.0–8.0)

## 2016-06-17 LAB — HEMOGLOBIN A1C
HEMOGLOBIN A1C: 5.2 % (ref <5.7)
MEAN PLASMA GLUCOSE: 103 mg/dL

## 2016-06-17 MED ORDER — VALSARTAN 320 MG PO TABS: 320.0000 mg | ORAL_TABLET | Freq: Every day | ORAL | 1 refills | Status: DC

## 2016-06-17 NOTE — Progress Notes (Signed)
MEDICARE ANNUAL WELLNESS VISIT AND 3 MONTH FOLLOW UP   Assessment:     Essential hypertension - continue medications, DASH diet, exercise and monitor at home. Call if greater than 130/80.  - Diovan refilled, 320mg , take 1/2, monitor BP at home   Hypothyroidism, unspecified hypothyroidism type Hypothyroidism-check TSH level, continue medications the same, reminded to take on an empty stomach 30-48mins before food.   Morbid obesity, unspecified obesity type ([place]) Obesity with co morbidities- long discussion about weight loss, diet, and exercise  Major depression in remission ([place]) Depression- continue medications, stress management techniques discussed, increase water, good sleep hygiene discussed, increase exercise, and increase veggies.    Hyperlipidemia -continue medications, check lipids, decrease fatty foods, increase activity.    Medication management   Vitamin D deficiency   Other abnormal glucose Discussed general issues about diabetes pathophysiology and management., Educational material distributed., Suggested low cholesterol diet., Encouraged aerobic exercise., Discussed foot care., Reminded to get yearly retinal exam.   Testosterone deficiency Hypogonadism- continue replacement therapy, check testosterone levels as needed.    Obstructive sleep apnea Continue CPAP, weight loss advised   myositis Monitor symptoms   Erectile dysfunction, unspecified erectile dysfunction type Med PRN, weight loss adivsed  Left foot drop Monitor  AKI (acute kidney injury) ([place]) -     BASIC METABOLIC PANEL WITH GFR  Closed fracture of right tibia and fibula, initial encounter  Encounter for Medicare annual wellness exam  Urinary frequency -     Urinalysis, Routine w reflex microscopic -     Urine culture   Over 40 minutes of exam, counseling, chart review and critical decision making was performed  Future Appointments Date Time Provider [place]  12/03/2016  9:00 AM Vicie Mutters, PA-C [id number] None     Plan:   During the course of the visit the patient was educated and counseled about appropriate screening and preventive services including:    Pneumococcal vaccine  Prevnar 13   Influenza vaccine  Td vaccine  Screening electrocardiogram  Bone densitometry screening  Colorectal cancer screening  Diabetes screening  Glaucoma screening  Nutrition counseling   Advanced directives: requested    Subjective:  [name] [name] is a 66 y.o. male who presents for Medicare Annual Wellness Visit and 4 month follow.   His blood pressure has not been checked at home, BP cuff broke, today their BP is BP: 140/90  He is now retired x 2 weeks, he jumped 4 foot fence and had tibia/fibula fracture s/p intramedullary nail tibial surgery 10/26, had AKI while in hospital, held HCTZ and ARB, and had post op blood lost. Walking with cane, some pain at anterior shin with walking, stairs. Not back on BP meds, no swelling in legs, states BP has been up at home.   He does workout. He denies chest pain, shortness of breath, dizziness.  He is on cholesterol medication and denies myalgias, he is not on statin due to history of myostis. His cholesterol is at goal. The cholesterol last visit was:   Lab Results  Component Value Date   CHOL 177 11/30/2015   HDL 39 (L) 11/30/2015   LDLCALC 95 11/30/2015   TRIG 215 (H) 11/30/2015   CHOLHDL 4.5 11/30/2015    He has been working on diet and exercise for prediabetes, and denies paresthesia of the feet, polydipsia, polyuria and visual disturbances. Last A1C in the office was:  Lab Results  Component Value Date   HGBA1C 5.8 (H) 11/30/2015   Patient is  on Vitamin D supplement.   Lab Results  Component Value Date   VD25OH 68 11/30/2015     Last GFR: Lab Results  Component Value Date   GFRNONAA >60 03/23/2016   BMI is Body mass index is 35.71 kg/m., he is working on diet and exercise. He has OSA, and  on CPAP.  Wt Readings from Last 3 Encounters:  06/17/16 228 lb (103.4 kg)  04/15/16 225 lb 3.2 oz (102.2 kg)  03/20/16 230 lb (104.3 kg)   He is on thyroid medication. His medication was not changed last visit.   Lab Results  Component Value Date   TSH 1.75 11/30/2015  .   Medication Review: Current Outpatient Prescriptions on File Prior to Visit  Medication Sig Dispense Refill  . Ascorbic Acid (VITAMIN C) 1000 MG tablet Take 1,000 mg by mouth daily.     Marland Kitchen aspirin 325 MG tablet Take 325 mg by mouth daily.      . B Complex Vitamins (B COMPLEX PO) Take 1 tablet by mouth See admin instructions. Takes M,W,F    . EPINEPHrine (EPIPEN 2-PAK) 0.3 mg/0.3 mL IJ SOAJ injection Inject 0.3 mLs (0.3 mg total) into the muscle once. 2 Device 0  . ezetimibe (ZETIA) 10 MG tablet Take 1 tablet (10 mg total) by mouth daily. 90 tablet 3  . fish oil-omega-3 fatty acids 1000 MG capsule Take 2 g by mouth 3 (three) times daily.     . Flaxseed, Linseed, (FLAX SEED OIL) 1000 MG CAPS Take 2,000 mg by mouth daily.    Marland Kitchen levothyroxine (SYNTHROID, LEVOTHROID) 50 MCG tablet Take 1 tablet (50 mcg total) by mouth daily. 90 tablet 1  . MAGNESIUM PO Take 650 mg by mouth daily.     . metoprolol tartrate (LOPRESSOR) 25 MG tablet Take 1 tablet (25 mg total) by mouth 2 (two) times daily. (Patient taking differently: Take 12.5 mg by mouth 2 (two) times daily. ) 180 tablet 1  . Multiple Vitamin (MULTIVITAMIN) capsule Take by mouth.    . sertraline (ZOLOFT) 100 MG tablet TAKE ONE AND ONE-HALF TO TWO TABLETS DAILY AS DIRECTED FOR MOOD 180 tablet 1   No current facility-administered medications on file prior to visit.     Allergies: Allergies  Allergen Reactions  . Ace Inhibitors Other (See Comments)    cough  . Bee Venom Other (See Comments)    Current Problems (verified) Patient Active Problem List   Diagnosis Date Noted  . AKI (acute kidney injury) ([place]) 03/21/2016  . Closed fracture of right tibia and fibula  03/20/2016  . Erectile dysfunction 09/12/2014  . Major depression in remission ([place]) 09/12/2014  . myositis 12/31/2013  . Medication management 12/31/2013  . Hypothyroidism 12/31/2013  . Left foot drop 08/30/2013  . Other abnormal glucose   . Vitamin D deficiency   . Hyperlipidemia 08/04/2009  . Testosterone deficiency 08/03/2009  . Morbid obesity ([place]) 08/03/2009  . Essential hypertension 08/01/2009  . Obstructive sleep apnea 08/01/2009    Screening Tests Immunization History  Administered Date(s) Administered  . Influenza Split 03/06/2012, 02/24/2013, 03/18/2014  . Influenza, High Dose Seasonal PF 02/12/2016  . Influenza-Unspecified 03/03/2015  . PPD Test 07/26/2013  . Pneumococcal Conjugate-13 02/12/2016  . Pneumococcal Polysaccharide-23 07/26/2013  . Pneumococcal-Unspecified 05/04/2004  . Tdap 07/24/2011  . Zoster 12/02/2013   Preventative care: Last colonoscopy: 07/2010 CXR 2017 CT head 2012 CT AB/Pelvis 2011 Echo 2011 Stress test 2014   Prior vaccinations: TD or Tdap: 2013  Influenza: 2017 Pneumococcal:  2015 Prevnar13: 2017 Shingles/Zostavax: 2015  Names of Other Physician/Practitioners you currently use: 1. [facility] here for primary care 2. Lenise Arena, eye doctor, last visit July 2017 3. Conley Canal, dentist, last visit July 2017 Patient Care Team: Unk Pinto, MD as PCP - General (Internal Medicine) Rana Snare, MD as Consulting Physician (Urology) Irene Shipper, MD as Consulting Physician (Gastroenterology) Melvyn Novas, MD as Referring Physician (Neurology) Lavonna Monarch, MD as Consulting Physician (Dermatology) Dyke Maes, OD (Optometry) Josue Hector, MD as Consulting Physician (Cardiology)  Surgical: Past Surgical History:  Procedure Laterality Date  . ANKLE FRACTURE SURGERY Left 1986  . APPENDECTOMY  1964  . CARPAL TUNNEL RELEASE Bilateral 1996  . CHOLECYSTECTOMY  1976  . TIBIA IM NAIL  INSERTION Right 03/21/2016   Procedure: INTRAMEDULLARY (IM) NAIL TIBIAL;  Surgeon: Renette Butters, MD;  Location: [facility];  Service: Orthopedics;  Laterality: Right;   Family His family history includes Diabetes in his father, mother, and sister; Heart disease in his father; Hypertension in his father. Social history  He reports that he quit smoking about 42 years ago. His smoking use included Cigarettes. He has a 14.00 pack-year smoking history. He has never used smokeless tobacco. He reports that he does not drink alcohol or use drugs.  MEDICARE WELLNESS OBJECTIVES: Physical activity:   Cardiac risk factors:   Depression/mood screen:   Depression screen [facility] 2/9 02/12/2016  Decreased Interest 0  Down, Depressed, Hopeless 0  PHQ - 2 Score 0    ADLs:  In your present state of health, do you have any difficulty performing the following activities: 03/20/2016 03/20/2016  Hearing? - N  Vision? - N  Difficulty concentrating or making decisions? - N  Walking or climbing stairs? - Y  Dressing or bathing? - N  Doing errands, shopping? N -  Some recent data might be hidden    Cognitive Testing  Alert? Yes  Normal Appearance?Yes  Oriented to person? Yes  Place? Yes   Time? Yes  Recall of three objects?  Yes  Can perform simple calculations? Yes  Displays appropriate judgment?Yes  Can read the correct time from a watch face?Yes  EOL planning:    Review of Systems  Constitutional: Negative.   HENT: Negative.   Eyes: Negative.   Respiratory: Negative.   Cardiovascular: Negative.   Gastrointestinal: Negative.   Genitourinary: Negative.   Musculoskeletal: Negative.   Skin: Negative.   Neurological: Negative.   Endo/Heme/Allergies: Negative.   Psychiatric/Behavioral: Negative.      Objective:     Today's Vitals   06/17/16 0844  BP: 140/90  Pulse: 64  Resp: 16  Temp: 97.7 F (36.5 C)  SpO2: 98%  Weight: 228 lb (103.4 kg)  Height: 5\' 7"  (1.702 m)   Body mass index is  35.71 kg/m.  General appearance: alert, no distress, WD/WN, male HEENT: normocephalic, sclerae anicteric, TMs pearly, nares patent, no discharge or erythema, pharynx normal Oral cavity: MMM, no lesions Neck: supple, no lymphadenopathy, no thyromegaly, no masses Heart: RRR, normal S1, S2, no murmurs Lungs: CTA bilaterally, no wheezes, rhonchi, or rales Abdomen: +bs, soft, non tender, non distended, no masses, no hepatomegaly, no splenomegaly Musculoskeletal: nontender, no swelling, no obvious deformity Extremities: no edema, no cyanosis, no clubbing Pulses: 2+ symmetric, upper and lower extremities, normal cap refill Neurological: alert, oriented x 3, CN2-12 intact, strength normal upper extremities and lower extremities, sensation normal throughout, DTRs 2+ throughout, no cerebellar signs, gait Normal Psychiatric: normal affect,  behavior normal, pleasant  Skin: Crown of scalp with 2x2 mm nonhealing ulcer with surrounding erythema, and further down on scalp with larger 4x69mm nonhealing ulcer.   Medicare Attestation I have personally reviewed: The patient's medical and social history Their use of alcohol, tobacco or illicit drugs Their current medications and supplements The patient's functional ability including ADLs,fall risks, home safety risks, cognitive, and hearing and visual impairment Diet and physical activities Evidence for depression or mood disorders  The patient's weight, height, BMI, and visual acuity have been recorded in the chart.  I have made referrals, counseling, and provided education to the patient based on review of the above and I have provided the patient with a written personalized care plan for preventive services.     Vicie Mutters, PA-C   06/17/2016

## 2016-06-17 NOTE — Patient Instructions (Signed)
Restart diovan 1/2 pill a day and check BP regularly Monitor your blood pressure at home. Go to the ER if any CP, SOB, nausea, dizziness, severe HA, changes vision/speech  Goal BP:  For patients younger than 60: Goal BP < 140/90. For patients 60 and older: Goal BP < 150/90. For patients with diabetes: Goal BP < 140/90. Your most recent BP: BP: 140/90   Take your medications faithfully as instructed. Maintain a healthy weight. Get at least 150 minutes of aerobic exercise per week. Minimize salt intake. Minimize alcohol intake  DASH Eating Plan DASH stands for "Dietary Approaches to Stop Hypertension." The DASH eating plan is a healthy eating plan that has been shown to reduce high blood pressure (hypertension). Additional health benefits may include reducing the risk of type 2 diabetes mellitus, heart disease, and stroke. The DASH eating plan may also help with weight loss. WHAT DO I NEED TO KNOW ABOUT THE DASH EATING PLAN? For the DASH eating plan, you will follow these general guidelines:  Choose foods with a percent daily value for sodium of less than 5% (as listed on the food label).  Use salt-free seasonings or herbs instead of table salt or sea salt.  Check with your health care provider or pharmacist before using salt substitutes.  Eat lower-sodium products, often labeled as "lower sodium" or "no salt added."  Eat fresh foods.  Eat more vegetables, fruits, and low-fat dairy products.  Choose whole grains. Look for the word "whole" as the first word in the ingredient list.  Choose fish and skinless chicken or [place] more often than red meat. Limit fish, poultry, and meat to 6 oz (170 g) each day.  Limit sweets, desserts, sugars, and sugary drinks.  Choose heart-healthy fats.  Limit cheese to 1 oz (28 g) per day.  Eat more home-cooked food and less restaurant, buffet, and fast food.  Limit fried foods.  Cook foods using methods other than frying.  Limit canned  vegetables. If you do use them, rinse them well to decrease the sodium.  When eating at a restaurant, ask that your food be prepared with less salt, or no salt if possible. WHAT FOODS CAN I EAT? Seek help from a dietitian for individual calorie needs. Grains Whole grain or whole wheat bread. Brown rice. Whole grain or whole wheat pasta. Quinoa, bulgur, and whole grain cereals. Low-sodium cereals. Corn or whole wheat flour tortillas. Whole grain cornbread. Whole grain crackers. Low-sodium crackers. Vegetables Fresh or frozen vegetables (raw, steamed, roasted, or grilled). Low-sodium or reduced-sodium tomato and vegetable juices. Low-sodium or reduced-sodium tomato sauce and paste. Low-sodium or reduced-sodium canned vegetables.  Fruits All fresh, canned (in natural juice), or frozen fruits. Meat and Other Protein Products Ground beef (85% or leaner), grass-fed beef, or beef trimmed of fat. Skinless chicken or [place]. Ground chicken or [place]. Pork trimmed of fat. All fish and seafood. Eggs. Dried beans, peas, or lentils. Unsalted nuts and seeds. Unsalted canned beans. Dairy Low-fat dairy products, such as skim or 1% milk, 2% or reduced-fat cheeses, low-fat ricotta or cottage cheese, or plain low-fat yogurt. Low-sodium or reduced-sodium cheeses. Fats and Oils Tub margarines without trans fats. Light or reduced-fat mayonnaise and salad dressings (reduced sodium). Avocado. Safflower, olive, or canola oils. Natural peanut or almond butter. Other Unsalted popcorn and pretzels. The items listed above may not be a complete list of recommended foods or beverages. Contact your dietitian for more options. WHAT FOODS ARE NOT RECOMMENDED? Grains White bread. White pasta. White  rice. Refined cornbread. Bagels and croissants. Crackers that contain trans fat. Vegetables Creamed or fried vegetables. Vegetables in a cheese sauce. Regular canned vegetables. Regular canned tomato sauce and paste. Regular tomato  and vegetable juices. Fruits Dried fruits. Canned fruit in light or heavy syrup. Fruit juice. Meat and Other Protein Products Fatty cuts of meat. Ribs, chicken wings, bacon, sausage, bologna, salami, chitterlings, fatback, hot dogs, bratwurst, and packaged luncheon meats. Salted nuts and seeds. Canned beans with salt. Dairy Whole or 2% milk, cream, half-and-half, and cream cheese. Whole-fat or sweetened yogurt. Full-fat cheeses or blue cheese. Nondairy creamers and whipped toppings. Processed cheese, cheese spreads, or cheese curds. Condiments Onion and garlic salt, seasoned salt, table salt, and sea salt. Canned and packaged gravies. Worcestershire sauce. Tartar sauce. Barbecue sauce. Teriyaki sauce. Soy sauce, including reduced sodium. Steak sauce. Fish sauce. Oyster sauce. Cocktail sauce. Horseradish. Ketchup and mustard. Meat flavorings and tenderizers. Bouillon cubes. Hot sauce. Tabasco sauce. Marinades. Taco seasonings. Relishes. Fats and Oils Butter, stick margarine, lard, shortening, ghee, and bacon fat. Coconut, palm kernel, or palm oils. Regular salad dressings. Other Pickles and olives. Salted popcorn and pretzels. The items listed above may not be a complete list of foods and beverages to avoid. Contact your dietitian for more information. WHERE CAN I FIND MORE INFORMATION? National Heart, Lung, and Blood Institute: travelstabloid.com Document Released: 05/02/2011 Document Revised: 09/27/2013 Document Reviewed: 03/17/2013 [facility] Patient Information 2015 [place], [place]. This information is not intended to replace advice given to you by your health care provider. Make sure you discuss any questions you have with your health care provider.   Simple math prevails.    1st - exercise does not produce significant weight loss - at best one converts fat into muscle , "bulks up", loses inches, but usually stays "weight neutral"     2nd - think of your body  weightas a check book: If you eat more calories than you burn up - you save money or gain weight .... Or if you spend more money than you put in the check book, ie burn up more calories than you eat, then you lose weight     3rd - if you walk or run 1 mile, you burn up 100 calories - you have to burn up 3,500 calories to lose 1 pound, ie you have to walk/run 35 miles to lose 1 measly pound. So if you want to lose 10 #, then you have to walk/run 350 miles, so.... clearly exercise is not the solution.     4. So if you consume 1,500 calories, then you have to burn up the equivalent of 15 miles to stay weight neutral - It also stands to reason that if you consume 1,500 cal/day and don't lose weight, then you must be burning up about 1,500 cals/day to stay weight neutral.     5. If you really want to lose weight, you must cut your calorie intake 300 calories /day and at that rate you should lose about 1 # every 3 days.   6. Please purchase Dr Fara Olden Fuhrman's book(s) "The End of Dieting" & "Eat to Live" . It has some great concepts and recipes.

## 2016-06-18 LAB — LIPID PANEL
CHOLESTEROL: 225 mg/dL — AB (ref <200)
HDL: 40 mg/dL — ABNORMAL LOW (ref >40)
LDL Cholesterol: 110 mg/dL — ABNORMAL HIGH (ref <100)
Total CHOL/HDL Ratio: 5.6 Ratio — ABNORMAL HIGH (ref <5.0)
Triglycerides: 377 mg/dL — ABNORMAL HIGH (ref <150)
VLDL: 75 mg/dL — ABNORMAL HIGH (ref <30)

## 2016-06-18 LAB — BASIC METABOLIC PANEL WITH GFR
BUN: 16 mg/dL (ref 7–25)
CALCIUM: 10 mg/dL (ref 8.6–10.3)
CO2: 30 mmol/L (ref 20–31)
CREATININE: 0.88 mg/dL (ref 0.70–1.25)
Chloride: 102 mmol/L (ref 98–110)
GFR, Est African American: >89 mL/min (ref >=60)
GFR, Est Non African American: >89 mL/min (ref >=60)
GLUCOSE: 93 mg/dL (ref 65–99)
Potassium: 4.6 mmol/L (ref 3.5–5.3)
SODIUM: 141 mmol/L (ref 135–146)

## 2016-06-18 LAB — URINE CULTURE: Organism ID, Bacteria: NO GROWTH

## 2016-06-18 LAB — HEPATIC FUNCTION PANEL
ALBUMIN: 4.5 g/dL (ref 3.6–5.1)
ALT: 29 U/L (ref 9–46)
AST: 28 U/L (ref 10–35)
Alkaline Phosphatase: 130 U/L — ABNORMAL HIGH (ref 40–115)
BILIRUBIN DIRECT: 0.1 mg/dL (ref <=0.2)
Indirect Bilirubin: 0.3 mg/dL (ref 0.2–1.2)
TOTAL PROTEIN: 7.2 g/dL (ref 6.1–8.1)
Total Bilirubin: 0.4 mg/dL (ref 0.2–1.2)

## 2016-06-18 LAB — VITAMIN D 25 HYDROXY (VIT D DEFICIENCY, FRACTURES): VIT D 25 HYDROXY: 74 ng/mL (ref 30–100)

## 2016-06-18 LAB — MAGNESIUM: MAGNESIUM: 2.1 mg/dL (ref 1.5–2.5)

## 2016-06-18 LAB — CK: CK TOTAL: 194 U/L (ref 7–232)

## 2016-06-18 NOTE — Progress Notes (Signed)
Pt aware of lab results & voiced understanding of those results.

## 2016-06-19 DIAGNOSIS — S82201D Unspecified fracture of shaft of right tibia, subsequent encounter for closed fracture with routine healing: Secondary | ICD-10-CM | POA: Diagnosis not present

## 2016-06-24 ENCOUNTER — Other Ambulatory Visit: Payer: Self-pay

## 2016-06-24 DIAGNOSIS — I10 Essential (primary) hypertension: Secondary | ICD-10-CM

## 2016-06-24 MED ORDER — VALSARTAN 320 MG PO TABS: 320.0000 mg | ORAL_TABLET | Freq: Every day | ORAL | 1 refills | Status: DC

## 2016-07-07 ENCOUNTER — Other Ambulatory Visit: Payer: Self-pay | Admitting: Internal Medicine

## 2016-09-10 DIAGNOSIS — D1801 Hemangioma of skin and subcutaneous tissue: Secondary | ICD-10-CM | POA: Diagnosis not present

## 2016-09-10 DIAGNOSIS — L821 Other seborrheic keratosis: Secondary | ICD-10-CM | POA: Diagnosis not present

## 2016-09-10 DIAGNOSIS — L814 Other melanin hyperpigmentation: Secondary | ICD-10-CM | POA: Diagnosis not present

## 2016-09-10 DIAGNOSIS — L570 Actinic keratosis: Secondary | ICD-10-CM | POA: Diagnosis not present

## 2016-09-19 ENCOUNTER — Ambulatory Visit (INDEPENDENT_AMBULATORY_CARE_PROVIDER_SITE_OTHER): Payer: PPO | Admitting: Internal Medicine

## 2016-09-19 ENCOUNTER — Encounter: Payer: Self-pay | Admitting: Internal Medicine

## 2016-09-19 VITALS — BP 126/78 | HR 60 | Temp 97.6°F | Resp 16 | Ht 67.0 in | Wt 221.4 lb

## 2016-09-19 DIAGNOSIS — E782 Mixed hyperlipidemia: Secondary | ICD-10-CM

## 2016-09-19 DIAGNOSIS — E559 Vitamin D deficiency, unspecified: Secondary | ICD-10-CM | POA: Diagnosis not present

## 2016-09-19 DIAGNOSIS — R7303 Prediabetes: Secondary | ICD-10-CM | POA: Diagnosis not present

## 2016-09-19 DIAGNOSIS — Z79899 Other long term (current) drug therapy: Secondary | ICD-10-CM

## 2016-09-19 DIAGNOSIS — I10 Essential (primary) hypertension: Secondary | ICD-10-CM

## 2016-09-19 DIAGNOSIS — E039 Hypothyroidism, unspecified: Secondary | ICD-10-CM | POA: Diagnosis not present

## 2016-09-19 LAB — HEPATIC FUNCTION PANEL
ALK PHOS: 103 U/L (ref 40–115)
ALT: 34 U/L (ref 9–46)
AST: 33 U/L (ref 10–35)
Albumin: 4.7 g/dL (ref 3.6–5.1)
BILIRUBIN DIRECT: 0.1 mg/dL (ref <=0.2)
Indirect Bilirubin: 0.5 mg/dL (ref 0.2–1.2)
Total Bilirubin: 0.6 mg/dL (ref 0.2–1.2)
Total Protein: 7 g/dL (ref 6.1–8.1)

## 2016-09-19 LAB — BASIC METABOLIC PANEL WITH GFR
BUN: 22 mg/dL (ref 7–25)
CHLORIDE: 103 mmol/L (ref 98–110)
CO2: 24 mmol/L (ref 20–31)
CREATININE: 0.96 mg/dL (ref 0.70–1.25)
Calcium: 9.8 mg/dL (ref 8.6–10.3)
GFR, Est African American: >89 mL/min (ref >=60)
GFR, Est Non African American: 83 mL/min (ref >=60)
Glucose, Bld: 90 mg/dL (ref 65–99)
POTASSIUM: 4.3 mmol/L (ref 3.5–5.3)
SODIUM: 140 mmol/L (ref 135–146)

## 2016-09-19 LAB — LIPID PANEL
CHOL/HDL RATIO: 5.6 ratio — AB (ref <5.0)
Cholesterol: 209 mg/dL — ABNORMAL HIGH (ref <200)
HDL: 37 mg/dL — ABNORMAL LOW (ref >40)
LDL CALC: 102 mg/dL — AB (ref <100)
Triglycerides: 349 mg/dL — ABNORMAL HIGH (ref <150)
VLDL: 70 mg/dL — AB (ref <30)

## 2016-09-19 LAB — CBC WITH DIFFERENTIAL/PLATELET
BASOS ABS: 51 {cells}/uL (ref 0–200)
BASOS PCT: 1 %
EOS ABS: 204 {cells}/uL (ref 15–500)
EOS PCT: 4 %
HCT: 46.5 % (ref 38.5–50.0)
HEMOGLOBIN: 15.7 g/dL (ref 13.2–17.1)
LYMPHS ABS: 1173 {cells}/uL (ref 850–3900)
Lymphocytes Relative: 23 %
MCH: 30 pg (ref 27.0–33.0)
MCHC: 33.8 g/dL (ref 32.0–36.0)
MCV: 88.7 fL (ref 80.0–100.0)
MPV: 9.8 fL (ref 7.5–12.5)
Monocytes Absolute: 561 cells/uL (ref 200–950)
Monocytes Relative: 11 %
NEUTROS ABS: 3111 {cells}/uL (ref 1500–7800)
Neutrophils Relative %: 61 %
Platelets: 190 10*3/uL (ref 140–400)
RBC: 5.24 MIL/uL (ref 4.20–5.80)
RDW: 15.7 % — ABNORMAL HIGH (ref 11.0–15.0)
WBC: 5.1 10*3/uL (ref 3.8–10.8)

## 2016-09-19 LAB — TSH: TSH: 1.14 m[IU]/L (ref 0.40–4.50)

## 2016-09-19 NOTE — Patient Instructions (Signed)

## 2016-09-19 NOTE — Progress Notes (Signed)
This very nice 67 y.o. MWM presents for 3 month follow up with Hypertension, Hyperlipidemia, Pre-Diabetes, Thyroid  and Vitamin D Deficiency.  Patient also is on CPAP for OSA with improved restorative sleep.      Patient is treated for HTN circa 1980's & BP has been controlled at home. Today's BP is at goal - 126/78. Patient has had no complaints of any cardiac type chest pain, palpitations, dyspnea/orthopnea/PND, dizziness, claudication, or dependent edema.     Hyperlipidemia is not controlled with diet & meds. Patient denies myalgias or other med SE's.  Apparently patient had been off of his Chol meds at last OV and last Lipids were not at goal:  Lab Results  Component Value Date   CHOL 225 (H) 06/17/2016   HDL 40 (L) 06/17/2016   LDLCALC 110 (H) 06/17/2016   TRIG 377 (H) 06/17/2016   CHOLHDL 5.6 (H) 06/17/2016      Also, the patient has history of PreDiabetes (A1c 5.9% in 2015) and has had no symptoms of reactive hypoglycemia, diabetic polys, paresthesias or visual blurring.  Last A1c was  Lab Results  Component Value Date   HGBA1C 5.2 06/17/2016      Further, the patient also has history of Vitamin D Deficiency and supplements vitamin D without any suspected side-effects. Last vitamin D was   Lab Results  Component Value Date   VD25OH 74 06/17/2016   Current Outpatient Prescriptions on File Prior to Visit  Medication Sig  . VITAMIN C 1000 MG Take  daily.   Marland Kitchen aspirin 325 MG Take 325 mg  daily.    . B COMPLEX  Take 1 tablet M,W,F  . EPIPEN 2-PAK  Inject 0.3 mLs IMonce.  . ezetimibe10 MG tablet Take 1 tab daily.  . fish oil-omega-3 fatty acids 1000 MG capsule Take 2 g  3 x daily.   Marland Kitchen FLAX SEED OIL 1000 MG  Take 2,000 mg  daily.  . Levothyroxine 50 MCG  TAKE 1 TAB EVERY DAY  . MAGNESIUM  Take 650 mg  daily.   . metoprolol tart 25 MG  Take 12.5 mg  2 x daily.   . Multiple Vitamin  Takedaily  . sertraline  100 MG tablet TAKE  1.5-2  TAB DAILY AS DIRECTED   . valsartan  320  MG tablet Take 1 tab daily.   Allergies  Allergen Reactions  . Ace Inhibitors Other (See Comments)    cough  . Bee Venom Other (See Comments)   PMHx:   Past Medical History:  Diagnosis Date  . Cardiomegaly   . Colon polyp   . Fatty liver disease, nonalcoholic   . Gallstones   . Hypertension   . Hypogonadism male   . Mixed hyperlipidemia   . Obesity   . Pancreas (digestive gland) works poorly   . Prediabetes   . Sleep apnea   . Vitamin D deficiency    Immunization History  Administered Date(s) Administered  . Influenza Split 03/06/2012, 02/24/2013, 03/18/2014  . Influenza, High Dose Seasonal PF 02/12/2016  . Influenza-Unspecified 03/03/2015  . PPD Test 07/26/2013  . Pneumococcal Conjugate-13 02/12/2016  . Pneumococcal Polysaccharide-23 07/26/2013  . Pneumococcal-Unspecified 05/04/2004  . Tdap 07/24/2011  . Zoster 12/02/2013   Past Surgical History:  Procedure Laterality Date  . ANKLE FRACTURE SURGERY Left 1986  . APPENDECTOMY  1964  . CARPAL TUNNEL RELEASE Bilateral 1996  . CHOLECYSTECTOMY  1976  . TIBIA IM NAIL INSERTION Right 03/21/2016   Procedure:  INTRAMEDULLARY (IM) NAIL TIBIAL;  Surgeon: Renette Butters, MD;  Location: [place];  Service: Orthopedics;  Laterality: Right;   FHx:    Reviewed / unchanged  SHx:    Reviewed / unchanged  Systems Review:  Constitutional: Denies fever, chills, wt changes, headaches, insomnia, fatigue, night sweats, change in appetite. Eyes: Denies redness, blurred vision, diplopia, discharge, itchy, watery eyes.  ENT: Denies discharge, congestion, post nasal drip, epistaxis, sore throat, earache, hearing loss, dental pain, tinnitus, vertigo, sinus pain, snoring.  CV: Denies chest pain, palpitations, irregular heartbeat, syncope, dyspnea, diaphoresis, orthopnea, PND, claudication or edema. Respiratory: denies cough, dyspnea, DOE, pleurisy, hoarseness, laryngitis, wheezing.  Gastrointestinal: Denies dysphagia, odynophagia, heartburn,  reflux, water brash, abdominal pain or cramps, nausea, vomiting, bloating, diarrhea, constipation, hematemesis, melena, hematochezia  or hemorrhoids. Genitourinary: Denies dysuria, frequency, urgency, nocturia, hesitancy, discharge, hematuria or flank pain. Musculoskeletal: Denies arthralgias, myalgias, stiffness, jt. swelling, pain, limping or strain/sprain.  Skin: Denies pruritus, rash, hives, warts, acne, eczema or change in skin lesion(s). Neuro: No weakness, tremor, incoordination, spasms, paresthesia or pain. Psychiatric: Denies confusion, memory loss or sensory loss. Endo: Denies change in weight, skin or hair change.  Heme/Lymph: No excessive bleeding, bruising or enlarged lymph nodes.  Physical Exam  BP 126/78   Pulse 60   Temp 97.6 F (36.4 C)   Resp 16   Ht 5\' 7"  (1.702 m)   Wt 221 lb 6.4 oz (100.4 kg)   BMI 34.68 kg/m   Appears well nourished, well groomed  and in no distress.  Eyes: PERRLA, EOMs, conjunctiva no swelling or erythema. Sinuses: No frontal/maxillary tenderness ENT/Mouth: EAC's clear, TM's nl w/o erythema, bulging. Nares clear w/o erythema, swelling, exudates. Oropharynx clear without erythema or exudates. Oral hygiene is good. Tongue normal, non obstructing. Hearing intact.  Neck: Supple. Thyroid nl. Car 2+/2+ without bruits, nodes or JVD. Chest: Respirations nl with BS clear & equal w/o rales, rhonchi, wheezing or stridor.  Cor: Heart sounds normal w/ regular rate and rhythm without sig. murmurs, gallops, clicks or rubs. Peripheral pulses normal and equal  without edema.  Abdomen: Soft & bowel sounds normal. Non-tender w/o guarding, rebound, hernias, masses or organomegaly.  Lymphatics: Unremarkable.  Musculoskeletal: Full ROM all peripheral extremities, joint stability, 5/5 strength and normal gait.  Skin: Warm, dry without exposed rashes, lesions or ecchymosis apparent.  Neuro: Cranial nerves intact, reflexes equal bilaterally. Sensory-motor testing  grossly intact. Tendon reflexes grossly intact.  Pysch: Alert & oriented x 3.  Insight and judgement nl & appropriate. No ideations.  Assessment and Plan:  1. Essential hypertension  - Continue medication, monitor blood pressure at home.  - Continue DASH diet. Reminder to go to the ER if any CP,  SOB, nausea, dizziness, severe HA, changes vision/speech,  left arm numbness and tingling and jaw pain.  - CBC with Differential/Platelet - BASIC METABOLIC PANEL WITH GFR - Magnesium - TSH  2. Hyperlipidemia, mixed  - Continue diet/meds, exercise,& lifestyle modifications.  - Continue monitor periodic cholesterol/liver & renal functions   - Hepatic function panel - Lipid panel - TSH  3. Prediabetes  - Continue diet, exercise, lifestyle modifications.  - Monitor appropriate labs.  - Hemoglobin A1c - Insulin, random  4. Vitamin D deficiency  - Continue supplementation.  - VITAMIN D 25 Hydroxy   5. Hypothyroidism  - TSH  6. Medication management  - CBC with Differential/Platelet - BASIC METABOLIC PANEL WITH GFR - Hepatic function panel - Magnesium - Lipid panel - TSH - Hemoglobin A1c -  Insulin, random - VITAMIN D 25 Hydroxy        Discussed  regular exercise, BP monitoring, weight control to achieve/maintain BMI less than 25 and discussed med and SE's. Recommended labs to assess and monitor clinical status with further disposition pending results of labs. Over 30 minutes of exam, counseling, chart review was performed.

## 2016-09-20 LAB — HEMOGLOBIN A1C
Hgb A1c MFr Bld: 5.4 % (ref <5.7)
MEAN PLASMA GLUCOSE: 108 mg/dL

## 2016-09-20 LAB — VITAMIN D 25 HYDROXY (VIT D DEFICIENCY, FRACTURES): VIT D 25 HYDROXY: 71 ng/mL (ref 30–100)

## 2016-09-20 LAB — MAGNESIUM: MAGNESIUM: 2.2 mg/dL (ref 1.5–2.5)

## 2016-09-20 LAB — INSULIN, RANDOM: INSULIN: 13 u[IU]/mL (ref 2.0–19.6)

## 2016-11-11 ENCOUNTER — Other Ambulatory Visit: Payer: Self-pay | Admitting: *Deleted

## 2016-11-11 MED ORDER — SERTRALINE HCL 100 MG PO TABS: ORAL_TABLET | ORAL | 1 refills | Status: DC

## 2016-12-03 ENCOUNTER — Encounter: Payer: Self-pay | Admitting: Physician Assistant

## 2016-12-30 NOTE — Progress Notes (Signed)
Complete Physical  Assessment and Plan: Essential hypertension - continue medications, DASH diet, exercise and monitor at home. Call if greater than 130/80.  - CBC with Differential/Platelet - BASIC METABOLIC PANEL WITH GFR - Hepatic function panel - Urinalysis, Routine w reflex microscopic - Microalbumin / creatinine urine ratio - EKG 12-Lead   Hypothyroidism, unspecified hypothyroidism type Hypothyroidism-check TSH level, continue medications the same, reminded to take on an empty stomach 30-88mins before food.  - TSH  Prediabetes Discussed general issues about diabetes pathophysiology and management., Educational material distributed., Suggested low cholesterol diet., Encouraged aerobic exercise., Discussed foot care., Reminded to get yearly retinal exam. - Hemoglobin A1c   Hyperlipidemia -continue medications, check lipids, decrease fatty foods, increase activity.  - Lipid panel  Morbid Obesity Obesity with co morbidities- long discussion about weight loss, diet, and exercise   Vitamin D deficiency - Vit D  25 hydroxy (rtn osteoporosis monitoring)  Testosterone deficiency - Testosterone  Medication management - Magnesium  Left foot drop Monitor, follow up ortho  myositis - CK  Obstructive sleep apnea Continue CPAP  Erectile dysfunction, unspecified erectile dysfunction type Continue testosterone and cialis PRN   Major depression in remission Continue zoloft   History of elevated PSA - PSA  Iron deficiency -     Iron and TIBC  Discussed med's effects and SE's. Screening labs and tests as requested with regular follow-up as recommended. Over 40 minutes of exam, counseling, chart review and critical decision making was performed Future Appointments Date Time Provider [place]  04/09/2017 9:30 AM Unk Pinto, MD [facility] None  04/16/2017 10:30 AM Baird Lyons D, MD [facility] None  07/10/2017 8:45 AM Vicie Mutters, PA-C [facility]  None  01/05/2018 9:00 AM Vicie Mutters, PA-C [facility] None    HPI Patient presents for a complete physical.   His blood pressure has been controlled at home, today their BP is BP: 124/78  He has a history of LVH with normal EF, last echo 2011, and normal cardiolite 2014. He is on ASA, BB, ARB, and fluid pill. He is retired, he had tib/fib fracture s/p surgery 03/21/16. Some muscle cramps at night bilateral legs, bilateral feet pain, feels socks are bunched under feet, better with new shoes.  He does not workout.  He denies chest pain, shortness of breath, dizziness.  He is on cholesterol medication and denies myalgias, had intolerant to statins due to elevated CPK so on zetia.  His cholesterol is at goal. The cholesterol last visit was:   Lab Results  Component Value Date   CHOL 209 (H) 09/19/2016   HDL 37 (L) 09/19/2016   LDLCALC 102 (H) 09/19/2016   TRIG 349 (H) 09/19/2016   CHOLHDL 5.6 (H) 09/19/2016  He has been working on diet and exercise for prediabetes,  and denies paresthesia of the feet, polydipsia, polyuria and visual disturbances. Last A1C in the office was:  Lab Results  Component Value Date   HGBA1C 5.4 09/19/2016  Patient is on Vitamin D supplement.   Lab Results  Component Value Date   VD25OH 60 09/19/2016  He is on zoloft for depression.  He is on thyroid medication. His medication was not changed last visit.   Lab Results  Component Value Date   TSH 1.14 09/19/2016   Last PSA was, follows with Dr. Suann Larry PRN, has had elevated PSA while on testosterone.  Lab Results  Component Value Date   PSA 0.71 11/30/2015  BMI is Body mass index is 34.3 kg/m., he is working on  diet and exercise, he has sleep apnea with CPAP and hypogonadism consequence of his obesity. He is working at the [employer]. Wt Readings from Last 3 Encounters:  01/01/17 219 lb (99.3 kg)  09/19/16 221 lb 6.4 oz (100.4 kg)  06/17/16 228 lb (103.4 kg)    Current Medications:  Current  Outpatient Prescriptions on File Prior to Visit  Medication Sig Dispense Refill  . Ascorbic Acid (VITAMIN C) 1000 MG tablet Take 1,000 mg by mouth daily.     Marland Kitchen aspirin 325 MG tablet Take 325 mg by mouth daily.      . B Complex Vitamins (B COMPLEX PO) Take 1 tablet by mouth See admin instructions. Takes M,W,F    . EPINEPHrine (EPIPEN 2-PAK) 0.3 mg/0.3 mL IJ SOAJ injection Inject 0.3 mLs (0.3 mg total) into the muscle once. 2 Device 0  . ezetimibe (ZETIA) 10 MG tablet Take 1 tablet (10 mg total) by mouth daily. 90 tablet 3  . fish oil-omega-3 fatty acids 1000 MG capsule Take 2 g by mouth 3 (three) times daily.     . Flaxseed, Linseed, (FLAX SEED OIL) 1000 MG CAPS Take 2,000 mg by mouth daily.    Marland Kitchen levothyroxine (SYNTHROID, LEVOTHROID) 50 MCG tablet TAKE 1 TABLET BY MOUTH EVERY DAY 90 tablet 1  . MAGNESIUM PO Take 650 mg by mouth daily.     . metoprolol tartrate (LOPRESSOR) 25 MG tablet Take 1 tablet (25 mg total) by mouth 2 (two) times daily. (Patient taking differently: Take 12.5 mg by mouth 2 (two) times daily. ) 180 tablet 1  . Multiple Vitamin (MULTIVITAMIN) capsule Take by mouth.    . sertraline (ZOLOFT) 100 MG tablet TAKE ONE AND ONE-HALF TO TWO TABLETS DAILY AS DIRECTED FOR MOOD 180 tablet 1   No current facility-administered medications on file prior to visit.    Health Maintenance:  Immunization History  Administered Date(s) Administered  . Influenza Split 03/06/2012, 02/24/2013, 03/18/2014  . Influenza, High Dose Seasonal PF 02/12/2016  . Influenza-Unspecified 03/03/2015  . PPD Test 07/26/2013  . Pneumococcal Conjugate-13 02/12/2016  . Pneumococcal Polysaccharide-23 07/26/2013  . Pneumococcal-Unspecified 05/04/2004  . Tdap 07/24/2011  . Zoster 12/02/2013   Influenza 2017 TDAP 2013 Pneumovax 2015 Prevnar: 2017 Shingles vaccine: 2015  Colonoscopy 08/03/10 due 2017 EYE Exam WNL 2017, has one friday ECHO 08/06/09 Neg cardiolite 2014 CXR 06/30/12 WNL U/S ABD 08/01/11- FATTY  LIVER  Patient Care Team: Unk Pinto, MD as PCP - General (Internal Medicine) Rana Snare, MD as Consulting Physician (Urology) Irene Shipper, MD as Consulting Physician (Gastroenterology) Melvyn Novas, MD as Referring Physician (Neurology) Lavonna Monarch, MD as Consulting Physician (Dermatology) Dyke Maes, OD (Optometry) Josue Hector, MD as Consulting Physician (Cardiology)  Medical History:  Past Medical History:  Diagnosis Date  . Cardiomegaly   . Colon polyp   . Fatty liver disease, nonalcoholic   . Gallstones   . Hypertension   . Hypogonadism male   . Mixed hyperlipidemia   . Obesity   . Pancreas (digestive gland) works poorly   . Prediabetes   . Sleep apnea   . Vitamin D deficiency    Allergies Allergies  Allergen Reactions  . Ace Inhibitors Other (See Comments)    cough  . Bee Venom Other (See Comments)    SURGICAL HISTORY He  has a past surgical history that includes Cholecystectomy (1976); Appendectomy (1964); Ankle fracture surgery (Left, 1986); Carpal tunnel release (Bilateral, 1996); and Tibia IM nail insertion (Right, 03/21/2016). FAMILY HISTORY  His family history includes Diabetes in his father, mother, and sister; Heart disease in his father; Hypertension in his father. SOCIAL HISTORY He  reports that he quit smoking about 42 years ago. His smoking use included Cigarettes. He has a 14.00 pack-year smoking history. He has never used smokeless tobacco. He reports that he does not drink alcohol or use drugs.  Review of Systems:  Review of Systems  Constitutional: Negative.   HENT: Negative.   Eyes: Negative.   Respiratory: Negative.   Cardiovascular: Negative.   Gastrointestinal: Negative.   Genitourinary: Negative.   Musculoskeletal: Positive for myalgias.  Skin: Negative.   Neurological: Negative.   Endo/Heme/Allergies: Negative.   Psychiatric/Behavioral: Negative.     Physical Exam: Estimated body mass index is 34.3  kg/m as calculated from the following:   Height as of this encounter: 5\' 7"  (1.702 m).   Weight as of this encounter: 219 lb (99.3 kg). BP 124/78   Pulse 85   Temp 97.7 F (36.5 C)   Resp 16   Ht 5\' 7"  (1.702 m)   Wt 219 lb (99.3 kg)   SpO2 97%   BMI 34.30 kg/m  General Appearance: Well nourished, in no apparent distress.  Eyes: PERRLA, EOMs, conjunctiva no swelling or erythema, normal fundi and vessels.  Sinuses: No Frontal/maxillary tenderness  ENT/Mouth: Ext aud canals clear except right ear with cerumen impaction, normal light reflex with TMs without erythema, bulging. Good dentition. No erythema, swelling, or exudate on post pharynx. Tonsils not swollen or erythematous. Hearing normal.  Neck: Supple, thyroid normal. No bruits  Respiratory: Respiratory effort normal, BS equal bilaterally without rales, rhonchi, wheezing or stridor.  Cardio: RRR without murmurs, rubs or gallops. Brisk peripheral pulses without edema.  Chest: symmetric, with normal excursions and percussion.  Abdomen: Soft, obese, nontender, with large vertical and right horizontal scar no guarding, rebound, hernias, masses, or organomegaly.  Lymphatics: Non tender without lymphadenopathy.  Genitourinary: defer Musculoskeletal: Full ROM all peripheral extremities,5/5 strength, and normal gait.  Skin:   Warm, dry without rashes, lesions, ecchymosis. Neuro: Cranial nerves intact, reflexes decreased bilateral legs, worse left than right with decrease dorsiflexion/plantar on left. Normal muscle tone, no cerebellar symptoms. Sensation intact.  Psych: Awake and oriented X 3, normal affect, Insight and Judgment appropriate.   EKG: WNL, sinus brady, no changes. AORTA SCAN: declines  Vicie Mutters 10:15 AM [facility]

## 2017-01-01 ENCOUNTER — Ambulatory Visit (INDEPENDENT_AMBULATORY_CARE_PROVIDER_SITE_OTHER): Payer: PPO | Admitting: Physician Assistant

## 2017-01-01 ENCOUNTER — Encounter: Payer: Self-pay | Admitting: Physician Assistant

## 2017-01-01 VITALS — BP 124/78 | HR 85 | Temp 97.7°F | Resp 16 | Ht 67.0 in | Wt 219.0 lb

## 2017-01-01 DIAGNOSIS — M609 Myositis, unspecified: Secondary | ICD-10-CM

## 2017-01-01 DIAGNOSIS — Z136 Encounter for screening for cardiovascular disorders: Secondary | ICD-10-CM | POA: Diagnosis not present

## 2017-01-01 DIAGNOSIS — Z79899 Other long term (current) drug therapy: Secondary | ICD-10-CM

## 2017-01-01 DIAGNOSIS — Z87898 Personal history of other specified conditions: Secondary | ICD-10-CM

## 2017-01-01 DIAGNOSIS — E782 Mixed hyperlipidemia: Secondary | ICD-10-CM | POA: Diagnosis not present

## 2017-01-01 DIAGNOSIS — E611 Iron deficiency: Secondary | ICD-10-CM | POA: Diagnosis not present

## 2017-01-01 DIAGNOSIS — I10 Essential (primary) hypertension: Secondary | ICD-10-CM

## 2017-01-01 DIAGNOSIS — Z0000 Encounter for general adult medical examination without abnormal findings: Secondary | ICD-10-CM

## 2017-01-01 DIAGNOSIS — E559 Vitamin D deficiency, unspecified: Secondary | ICD-10-CM

## 2017-01-01 DIAGNOSIS — G4733 Obstructive sleep apnea (adult) (pediatric): Secondary | ICD-10-CM

## 2017-01-01 DIAGNOSIS — R7309 Other abnormal glucose: Secondary | ICD-10-CM

## 2017-01-01 DIAGNOSIS — E039 Hypothyroidism, unspecified: Secondary | ICD-10-CM | POA: Diagnosis not present

## 2017-01-01 DIAGNOSIS — Z0001 Encounter for general adult medical examination with abnormal findings: Secondary | ICD-10-CM

## 2017-01-01 DIAGNOSIS — F325 Major depressive disorder, single episode, in full remission: Secondary | ICD-10-CM

## 2017-01-01 DIAGNOSIS — M21372 Foot drop, left foot: Secondary | ICD-10-CM

## 2017-01-01 DIAGNOSIS — N529 Male erectile dysfunction, unspecified: Secondary | ICD-10-CM

## 2017-01-01 LAB — CBC WITH DIFFERENTIAL/PLATELET
BASOS ABS: 44 {cells}/uL (ref 0–200)
Basophils Relative: 1 %
EOS PCT: 6 %
Eosinophils Absolute: 264 cells/uL (ref 15–500)
HCT: 47.2 % (ref 38.5–50.0)
HEMOGLOBIN: 15.8 g/dL (ref 13.2–17.1)
LYMPHS ABS: 1056 {cells}/uL (ref 850–3900)
Lymphocytes Relative: 24 %
MCH: 30.7 pg (ref 27.0–33.0)
MCHC: 33.5 g/dL (ref 32.0–36.0)
MCV: 91.7 fL (ref 80.0–100.0)
MPV: 10.1 fL (ref 7.5–12.5)
Monocytes Absolute: 660 cells/uL (ref 200–950)
Monocytes Relative: 15 %
NEUTROS PCT: 54 %
Neutro Abs: 2376 cells/uL (ref 1500–7800)
Platelets: 230 10*3/uL (ref 140–400)
RBC: 5.15 MIL/uL (ref 4.20–5.80)
RDW: 14.2 % (ref 11.0–15.0)
WBC: 4.4 10*3/uL (ref 3.8–10.8)

## 2017-01-01 LAB — TSH: TSH: 1.38 m[IU]/L (ref 0.40–4.50)

## 2017-01-01 MED ORDER — LOSARTAN POTASSIUM 100 MG PO TABS: 100.0000 mg | ORAL_TABLET | Freq: Every day | ORAL | 1 refills | Status: DC

## 2017-01-01 NOTE — Patient Instructions (Addendum)
Call to set up colonoscopy Phone: [phone]; Dr. Presley Raddle Neuralgia [place] Lathe neuralgia is a type of foot pain in the area closest to your toes. This area is sometimes called the ball of your foot. Morton neuralgia occurs when a branch of a nerve in your foot (digital nerve) becomes compressed. When this happens over a long period of time, the nerve can thicken (neuroma) and cause pain. This usually occurs between the third and fourth toe. Morton neuralgia can come and go but may get worse over time. What are the causes? Your digital nerve can become compressed and stretched at a point where it passes under a thick band of tissue that connects your toes (intermetatarsal ligament). Morton neuralgia can be caused by mild repetitive damage in this area. This type of damage can result from:  Activities such as running or jumping.  Wearing shoes that are too tight.  What are the signs or symptoms? The first symptom of Morton neuralgia is pain that spreads from the ball of your foot to your toes. It may feel like you are walking on a marble. Pain usually gets worse with walking and goes away at night. Other symptoms may include numbness and cramping of your toes. How is this diagnosed? Your health care provider will do a physical exam. When doing the exam, your health care provider may:  Squeeze your foot just behind your toe.  Ask you to move your toes to check for pain.  You may also have tests on your foot to confirm the diagnosis. These may include:  An X-ray.  An MRI.  How is this treated? Treatment for Morton neuralgia may be as simple as changing the kind of shoes you wear. Other treatments may include:  Wearing a supportive pad (orthosis) under the front of your foot. This lifts your toe bones and takes pressure off the nerve.  Getting injections of numbing medicine and anti-inflammatory medicine (steroid) in the nerve.  Having surgery to remove part of the thickened  nerve.  Follow these instructions at home:  Take medicine only as directed by your health care provider.  Wear soft-soled shoes with a wide toe area.  Stop activities that may be causing pain.  Elevate your foot when resting.  Massage your foot.  Apply ice to the injured area: ? Put ice in a plastic bag. ? Place a towel between your skin and the bag. ? Leave the ice on for 20 minutes, 2-3 times a day.  Keep all follow-up visits as directed by your health care provider. This is important. Contact a health care provider if:  Home care instructions are not helping you get better.  Your symptoms change or get worse. This information is not intended to replace advice given to you by your health care provider. Make sure you discuss any questions you have with your health care provider. Document Released: 08/19/2000 Document Revised: 10/19/2015 Document Reviewed: 07/14/2013 Elsevier Interactive Patient Education  2018 [employer].   Simple math prevails.    1st - exercise does not produce significant weight loss - at best one converts fat into muscle , "bulks up", loses inches, but usually stays "weight neutral"     2nd - think of your body weightas a check book: If you eat more calories than you burn up - you save money or gain weight .... Or if you spend more money than you put in the check book, ie burn up more calories than you eat, then  you lose weight     3rd - if you walk or run 1 mile, you burn up 100 calories - you have to burn up 3,500 calories to lose 1 pound, ie you have to walk/run 35 miles to lose 1 measly pound. So if you want to lose 10 #, then you have to walk/run 350 miles, so.... clearly exercise is not the solution.     4. So if you consume 1,500 calories, then you have to burn up the equivalent of 15 miles to stay weight neutral - It also stands to reason that if you consume 1,500 cal/day and don't lose weight, then you must be burning up about 1,500 cals/day to  stay weight neutral.     5. If you really want to lose weight, you must cut your calorie intake 300 calories /day and at that rate you should lose about 1 # every 3 days.   6. Please purchase Dr Fara Olden Fuhrman's book(s) "The End of Dieting" & "Eat to Live" . It has some great concepts and recipes.        Bad carbs also include fruit juice, alcohol, and sweet tea. These are empty calories that do not signal to your brain that you are full.   Please remember the good carbs are still carbs which convert into sugar. So please measure them out no more than 1/2-1 cup of rice, oatmeal, pasta, and beans  Veggies are however free foods! Pile them on.   Not all fruit is created equal. Please see the list below, the fruit at the bottom is higher in sugars than the fruit at the top. Please avoid all dried fruits.

## 2017-01-02 LAB — BASIC METABOLIC PANEL WITH GFR
BUN: 22 mg/dL (ref 7–25)
CALCIUM: 10.3 mg/dL (ref 8.6–10.3)
CO2: 22 mmol/L (ref 20–32)
CREATININE: 0.94 mg/dL (ref 0.70–1.25)
Chloride: 103 mmol/L (ref 98–110)
GFR, Est African American: >89 mL/min (ref >=60)
GFR, Est Non African American: 85 mL/min (ref >=60)
Glucose, Bld: 89 mg/dL (ref 65–99)
Potassium: 4.4 mmol/L (ref 3.5–5.3)
SODIUM: 142 mmol/L (ref 135–146)

## 2017-01-02 LAB — LIPID PANEL
CHOLESTEROL: 220 mg/dL — AB (ref <200)
HDL: 42 mg/dL (ref >40)
LDL Cholesterol: 122 mg/dL — ABNORMAL HIGH (ref <100)
Total CHOL/HDL Ratio: 5.2 Ratio — ABNORMAL HIGH (ref <5.0)
Triglycerides: 280 mg/dL — ABNORMAL HIGH (ref <150)
VLDL: 56 mg/dL — ABNORMAL HIGH (ref <30)

## 2017-01-02 LAB — HEPATIC FUNCTION PANEL
ALT: 36 U/L (ref 9–46)
AST: 33 U/L (ref 10–35)
Albumin: 4.9 g/dL (ref 3.6–5.1)
Alkaline Phosphatase: 82 U/L (ref 40–115)
BILIRUBIN DIRECT: 0.1 mg/dL (ref <=0.2)
Indirect Bilirubin: 0.3 mg/dL (ref 0.2–1.2)
TOTAL PROTEIN: 7.6 g/dL (ref 6.1–8.1)
Total Bilirubin: 0.4 mg/dL (ref 0.2–1.2)

## 2017-01-02 LAB — MICROALBUMIN / CREATININE URINE RATIO
Creatinine, Urine: 275 mg/dL (ref 20–370)
Microalb Creat Ratio: 15 mcg/mg creat (ref <30)
Microalb, Ur: 4.2 mg/dL

## 2017-01-02 LAB — URINALYSIS, ROUTINE W REFLEX MICROSCOPIC
BILIRUBIN URINE: NEGATIVE
GLUCOSE, UA: NEGATIVE
Hgb urine dipstick: NEGATIVE
KETONES UR: NEGATIVE
Leukocytes, UA: NEGATIVE
Nitrite: NEGATIVE
PH: 6 (ref 5.0–8.0)
Protein, ur: NEGATIVE
Specific Gravity, Urine: 1.028 (ref 1.001–1.035)

## 2017-01-02 LAB — IRON AND TIBC
%SAT: 23 % (ref 15–60)
Iron: 94 ug/dL (ref 50–180)
TIBC: 404 ug/dL (ref 250–425)
UIBC: 310 ug/dL

## 2017-01-02 LAB — PSA: PSA: 0.6 ng/mL (ref <=4.0)

## 2017-01-02 LAB — CK: CK TOTAL: 419 U/L — AB (ref 44–196)

## 2017-01-02 LAB — MAGNESIUM: Magnesium: 2.2 mg/dL (ref 1.5–2.5)

## 2017-01-02 NOTE — Progress Notes (Signed)
Pt aware of lab results & voiced understanding of those results.

## 2017-01-02 NOTE — Progress Notes (Signed)
LVM for pt to return office call for LAB results.

## 2017-01-03 ENCOUNTER — Telehealth: Payer: Self-pay

## 2017-01-03 DIAGNOSIS — H5213 Myopia, bilateral: Secondary | ICD-10-CM | POA: Diagnosis not present

## 2017-01-03 IMAGING — CR DG CHEST 2V
3 series · 3 of 3 positions shown · non-contrast
Comparison: PA and lateral chest 11/30/2015 and 06/30/2012

CLINICAL DATA: Medical clearance examination. No current
complaints.

EXAM:
CHEST  2 VIEW

[w chest lat]
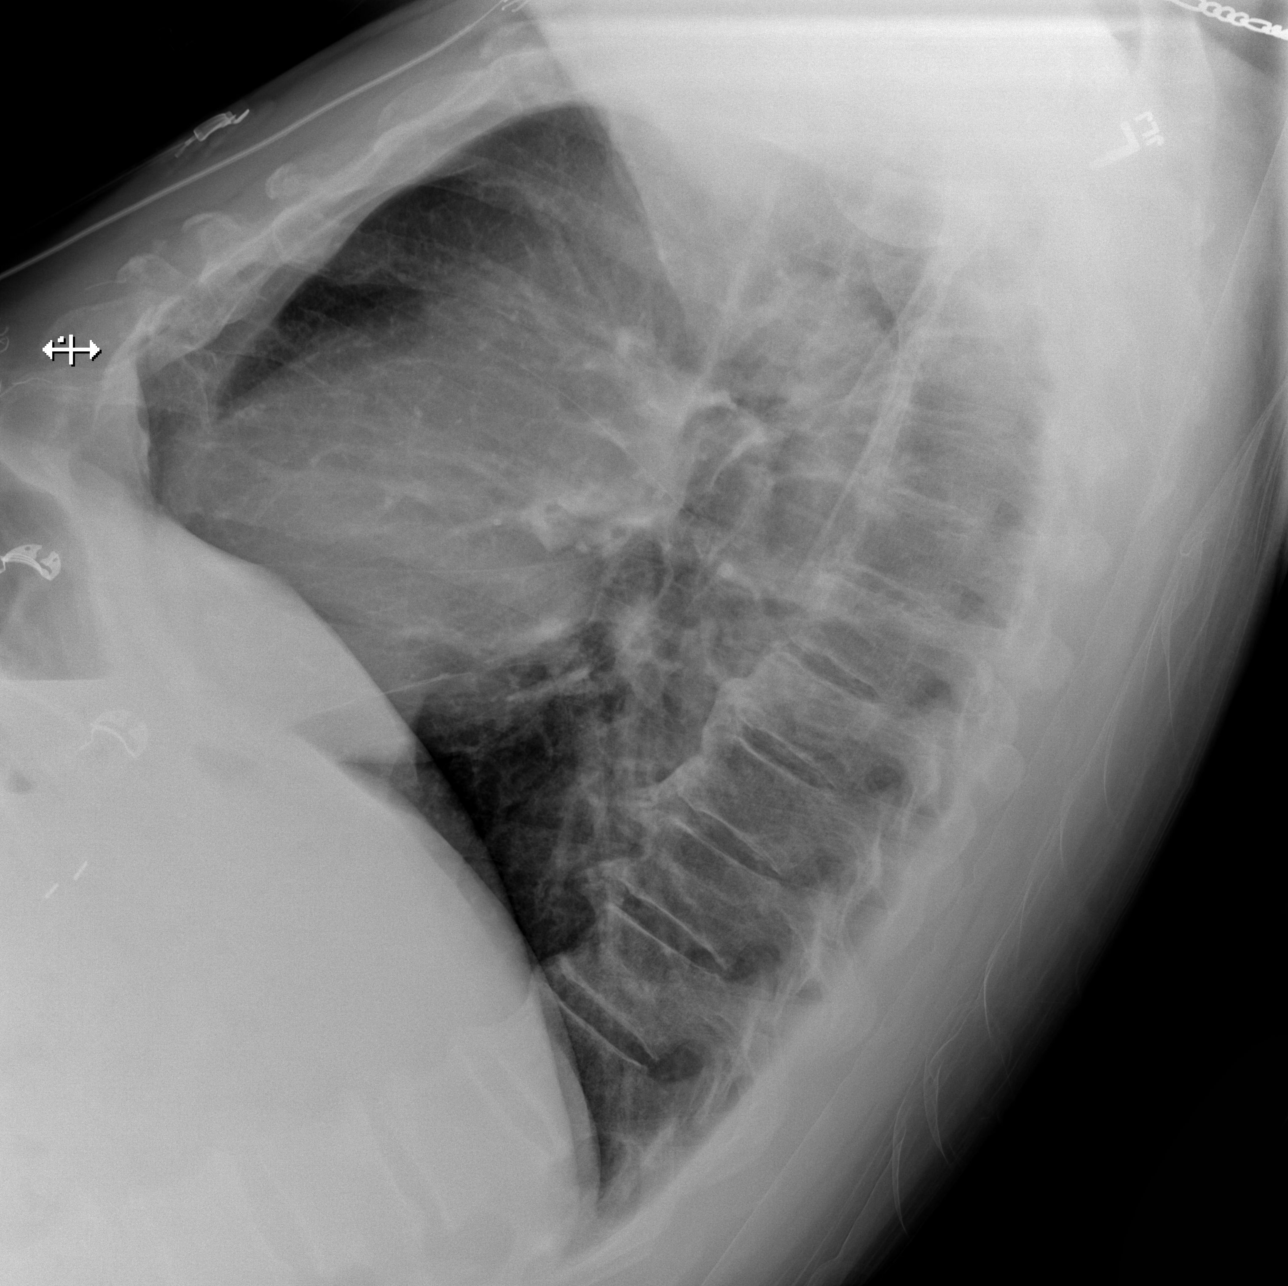

[x chest ap (1 of 2)]
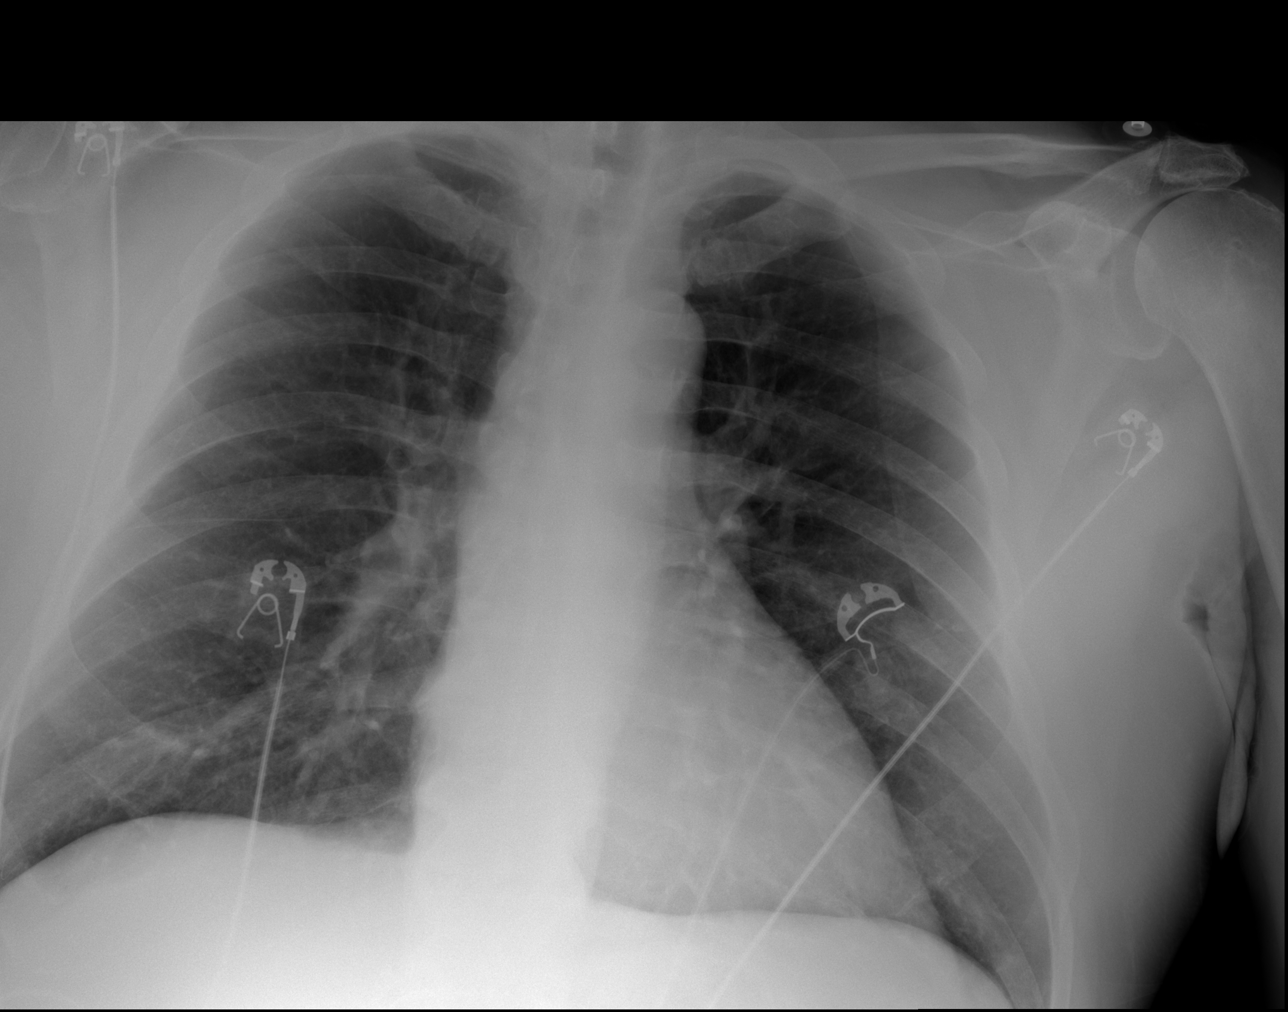

[x chest ap (2 of 2)]
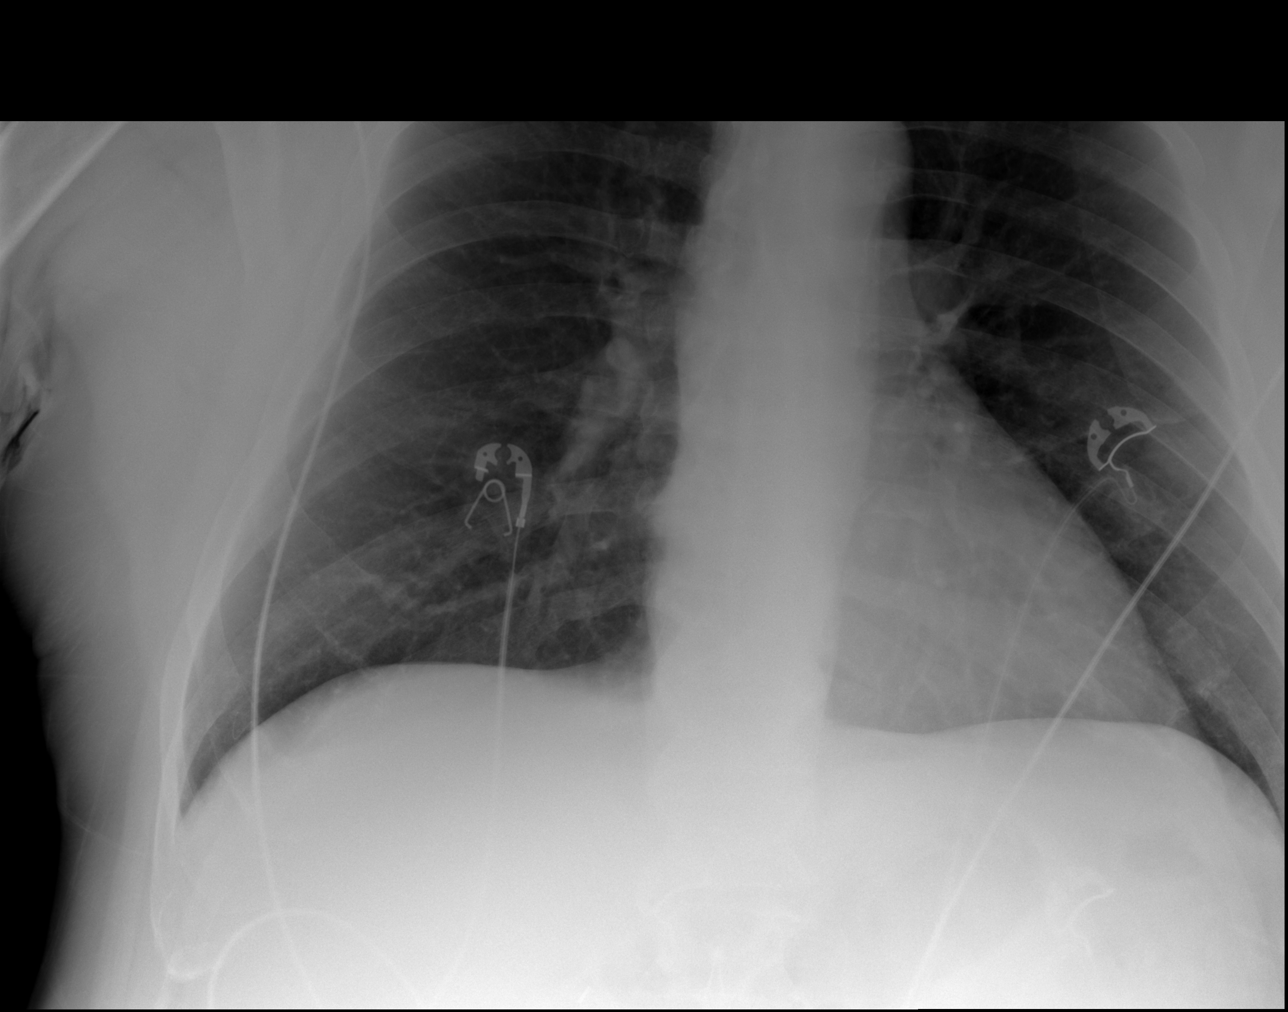

[3 of 3 positions shown; findings below may reference images not displayed]

FINDINGS: The lungs are clear. Heart size is normal. No pneumothorax or
pleural effusion. Thoracic spondylosis is noted.
IMPRESSION: No acute disease.

## 2017-01-03 IMAGING — CR DG TIBIA/FIBULA 2V*R*
4 series · 4 of 4 positions shown · non-contrast
Comparison: None.

CLINICAL DATA: Pt at home this a.m, and jumped from ladder over
fence and landed wrong, severe pain, deformity rt lower leg

EXAM:
RIGHT TIBIA AND FIBULA - 2 VIEW

[x tib-fib ap right (1 of 2)]
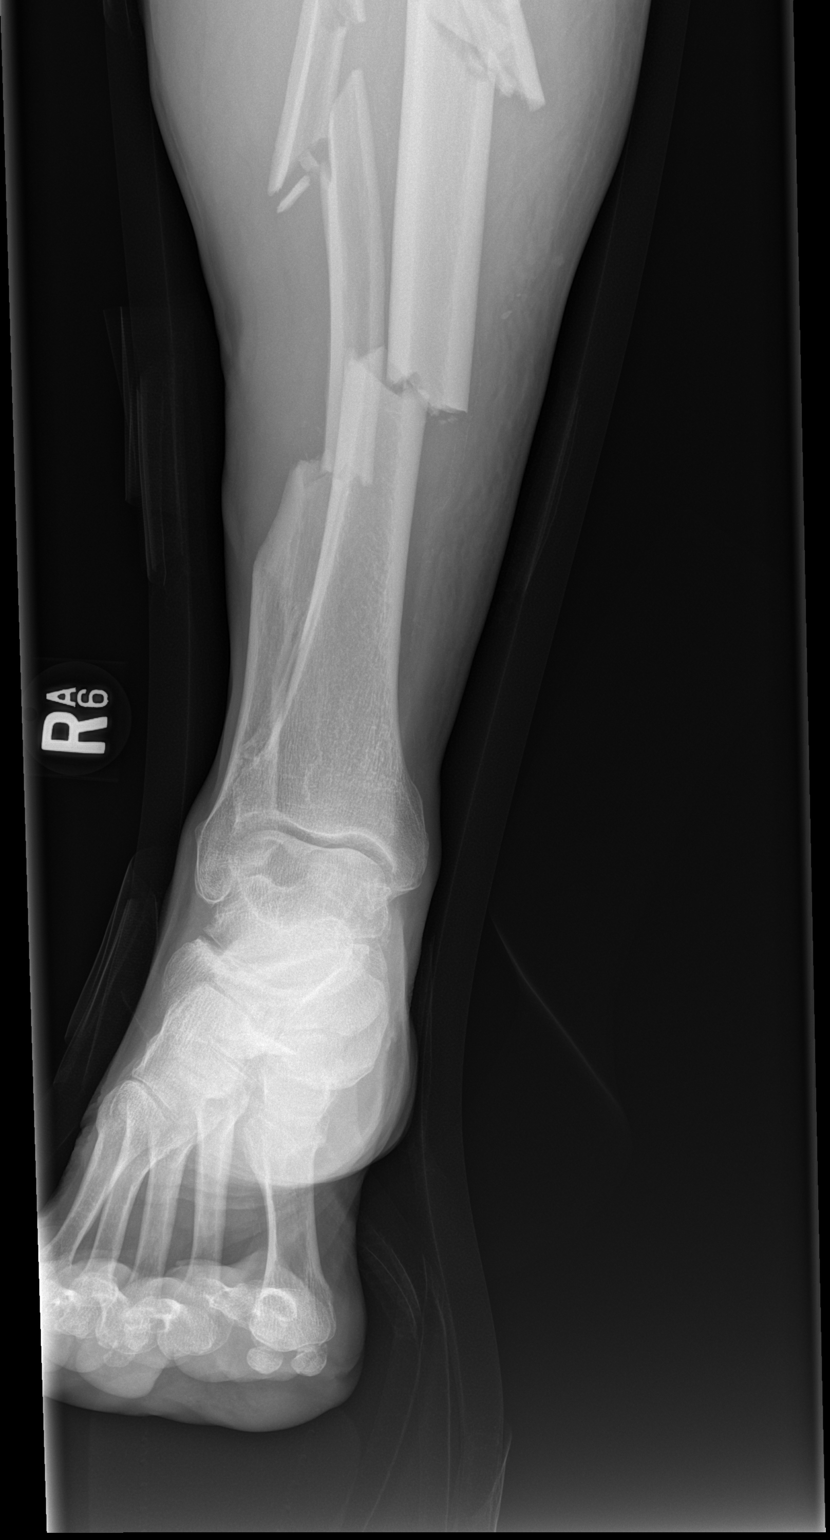

[x tib-fib ap right (2 of 2)]
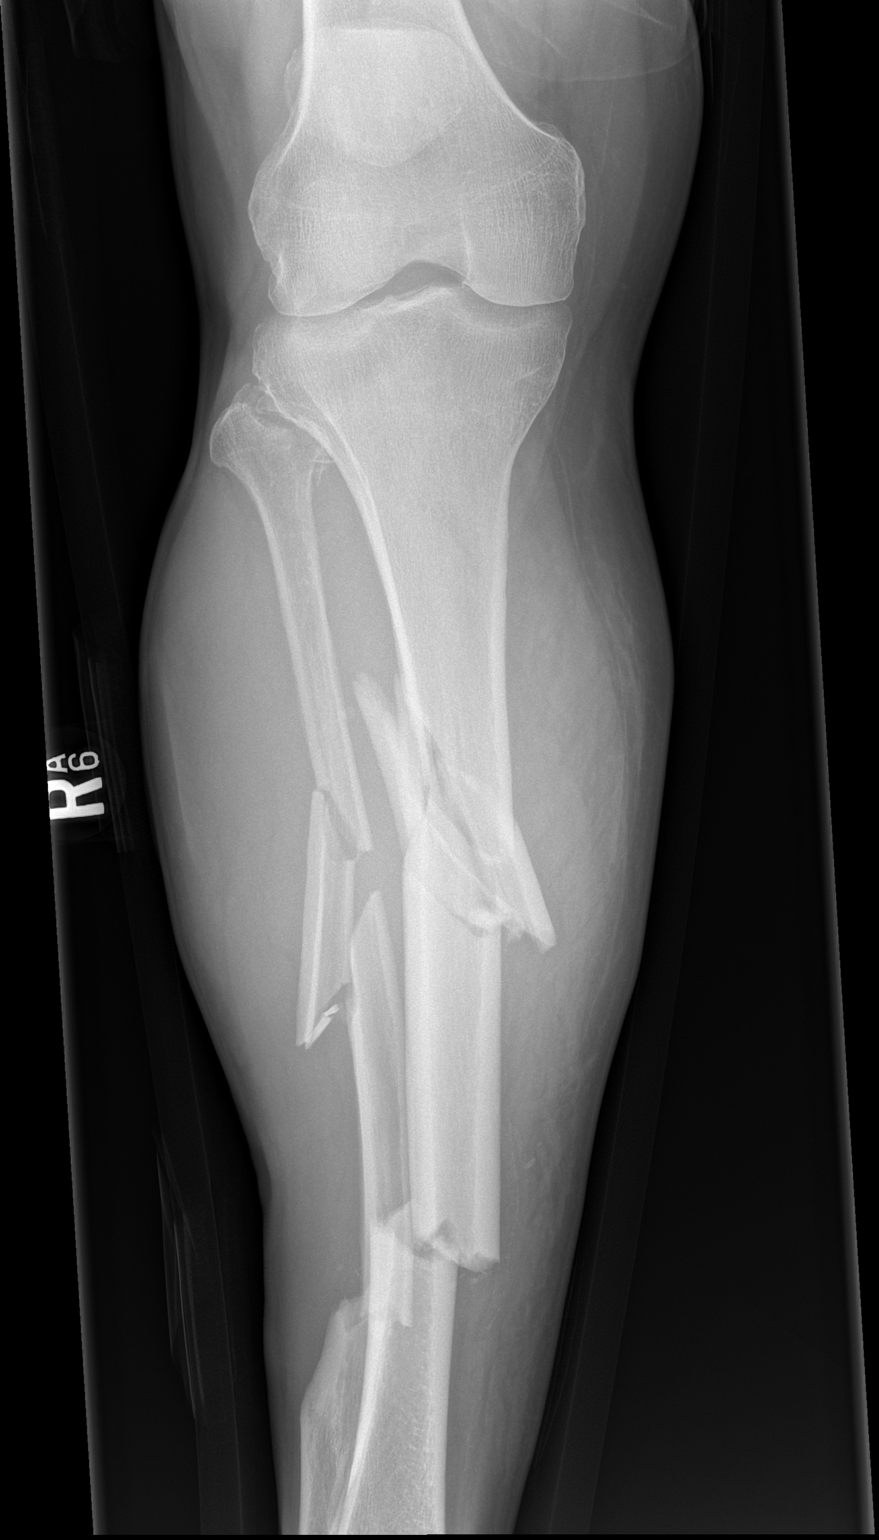

[x tib-fib lat right (1 of 2)]
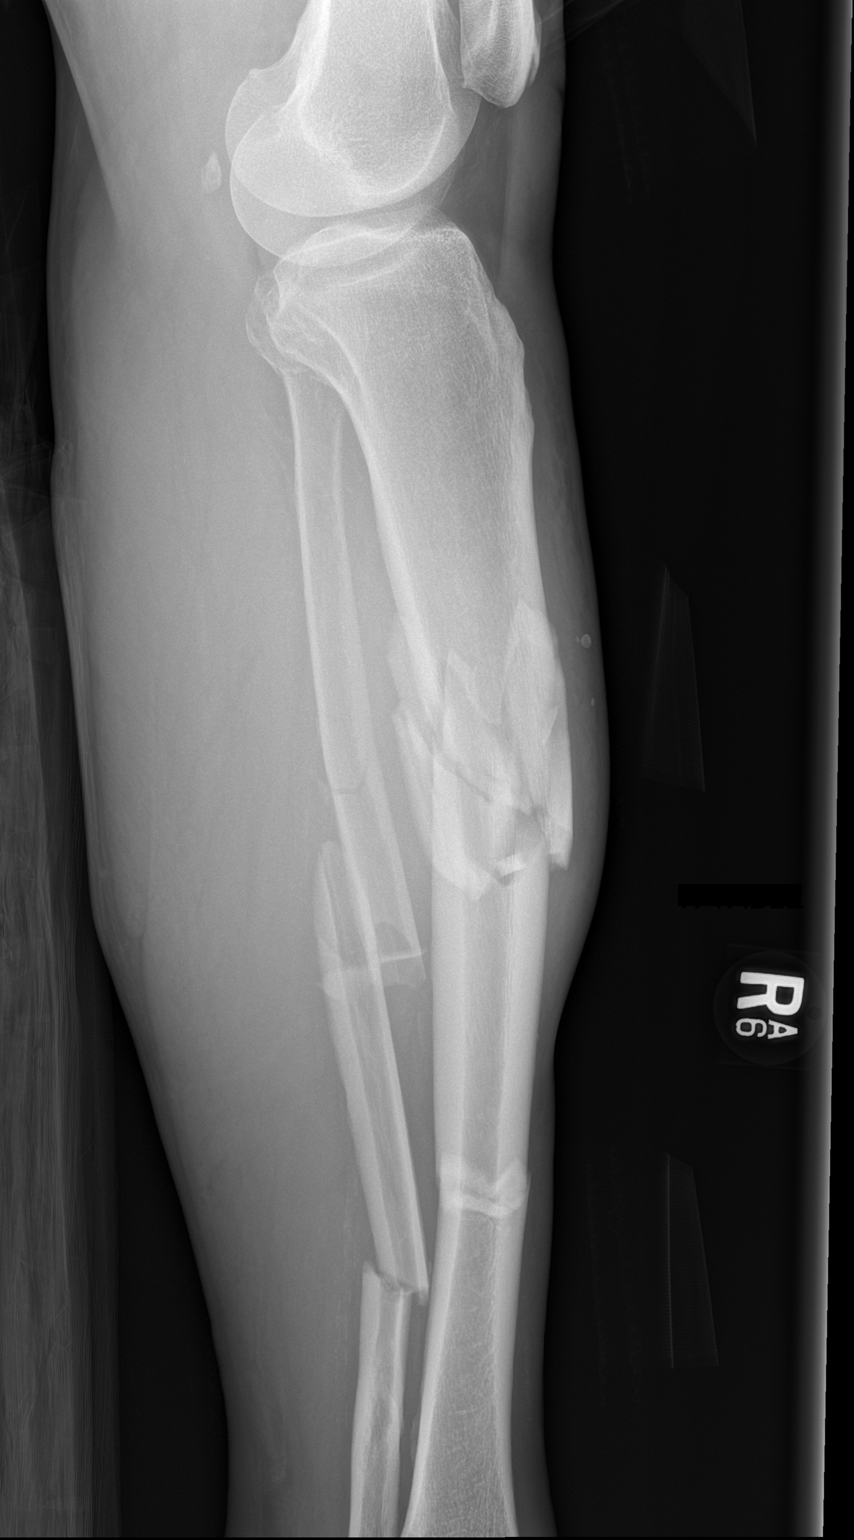

[x tib-fib lat right (2 of 2)]
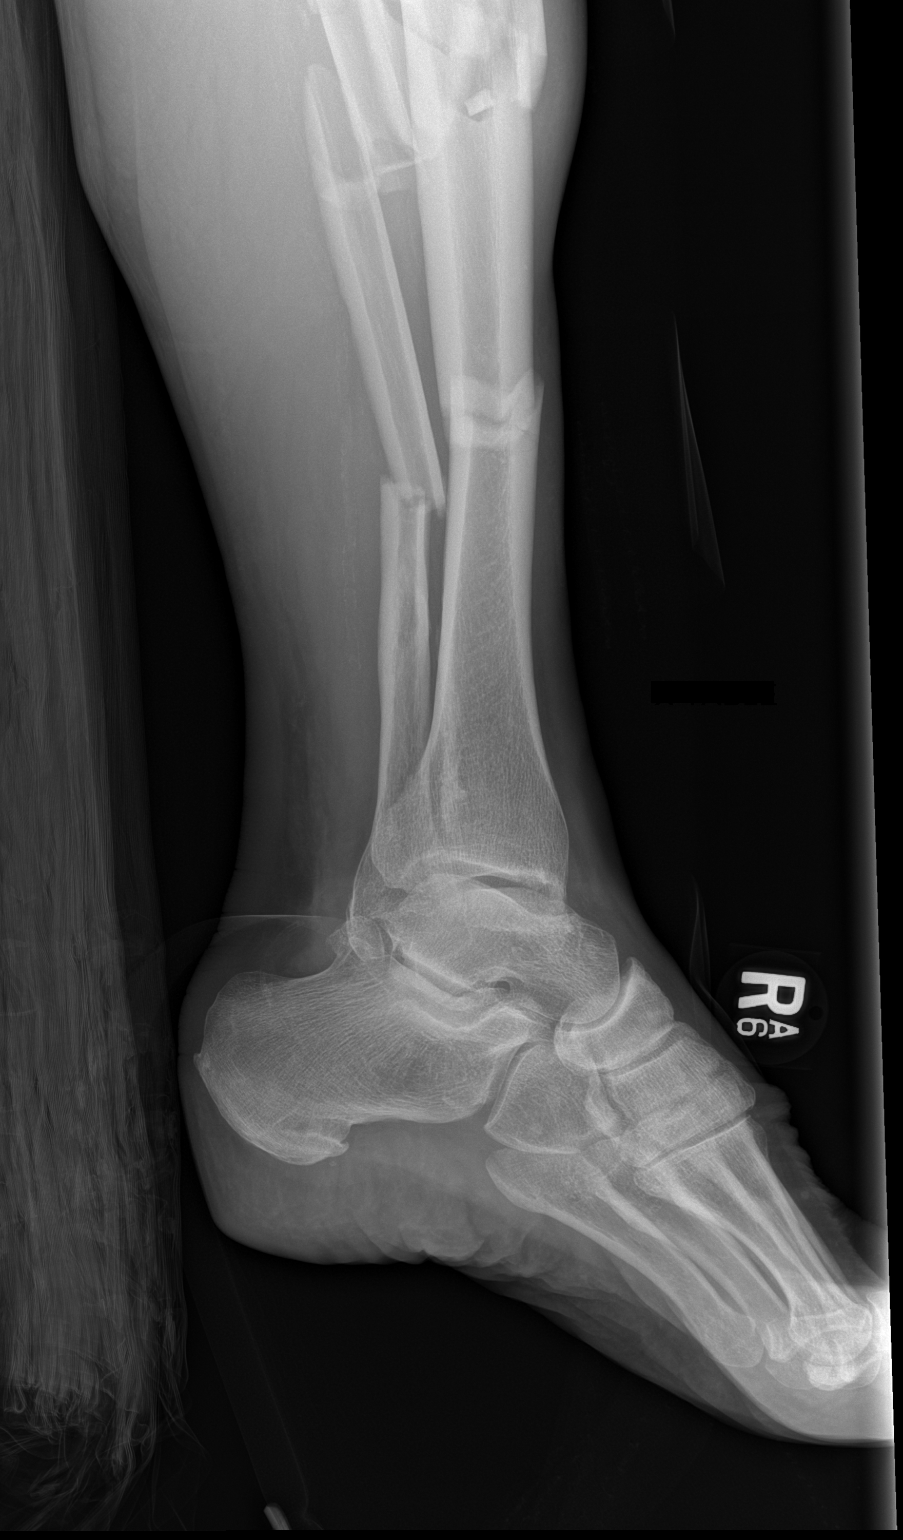

[4 of 4 positions shown; findings below may reference images not displayed]

FINDINGS: There are comminuted displaced fractures of the right tibia and
fibula.

The fractures extend from the proximal to distal diaphysis of both
the tibia and fibula. There are multiple comminuted fracture
components. Major tibial fracture components are displaced as much
as 13 mm, with the distal primary fracture component displaced
laterally. There is displacement of the fibular fracture components
of a maximum of 1 full shaft width.

There is slight angulation of the fractures in a posterior lateral
direction.

Diffuse surrounding soft tissue swelling is noted.

The knee and ankle joints are normally aligned.
IMPRESSION: 1. Comminuted, displaced and mildly angulated fractures of the right
tibia and fibula from the mid through the distal shafts.

## 2017-01-03 NOTE — Telephone Encounter (Signed)
Lvm for pt or pt's wife to return office call

## 2017-01-03 NOTE — Telephone Encounter (Signed)
-----   Message from Vicie Mutters, [place] sent at 01/02/2017  5:37 PM EDT ----- Regarding: RE: QUESTION No, but it is lower than it has been in the past and it can still be elevated from his fall/break, but mainly just drink water.  Estill Bamberg ----- Message ----- From: Elenor Quinones, CMA Sent: 01/02/2017   4:49 PM To: Vicie Mutters, PA-C Subject: QUESTION                                       Pt's wife would like to know if there is anything that they can do at home other than drinking water that can help lower his CPK #s. Please advise.

## 2017-01-04 IMAGING — DX DG TIBIA/FIBULA PORT 2V*R*
3 series · 3 of 3 positions shown · non-contrast
Comparison: 03/20/2016 tibia/ fibula radiographs. 03/21/2016
intraoperative fluoroscopic images.

CLINICAL DATA: Status post fixation of right tibia fracture.

EXAM:
PORTABLE RIGHT TIBIA AND FIBULA - 2 VIEW

[tibia ap]
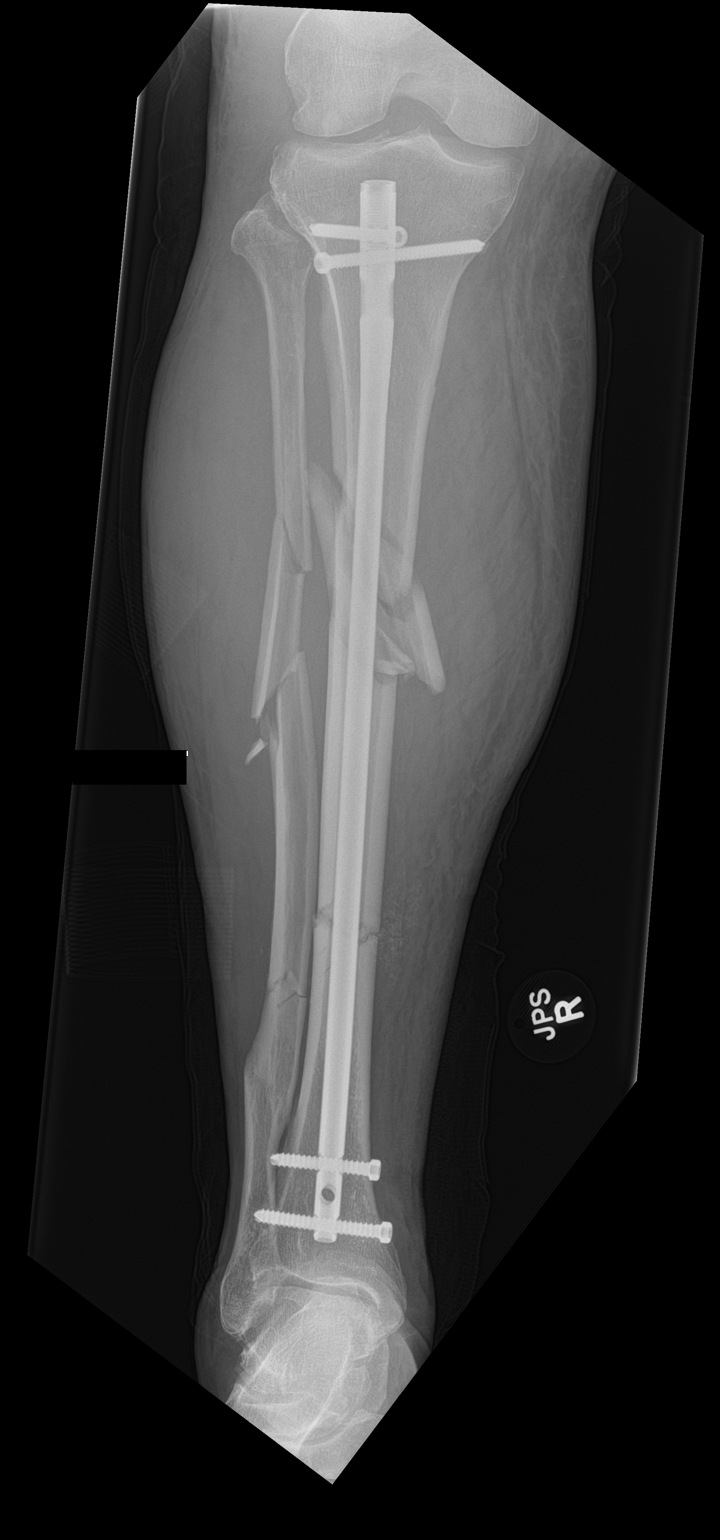

[tibia lat (1 of 2)]
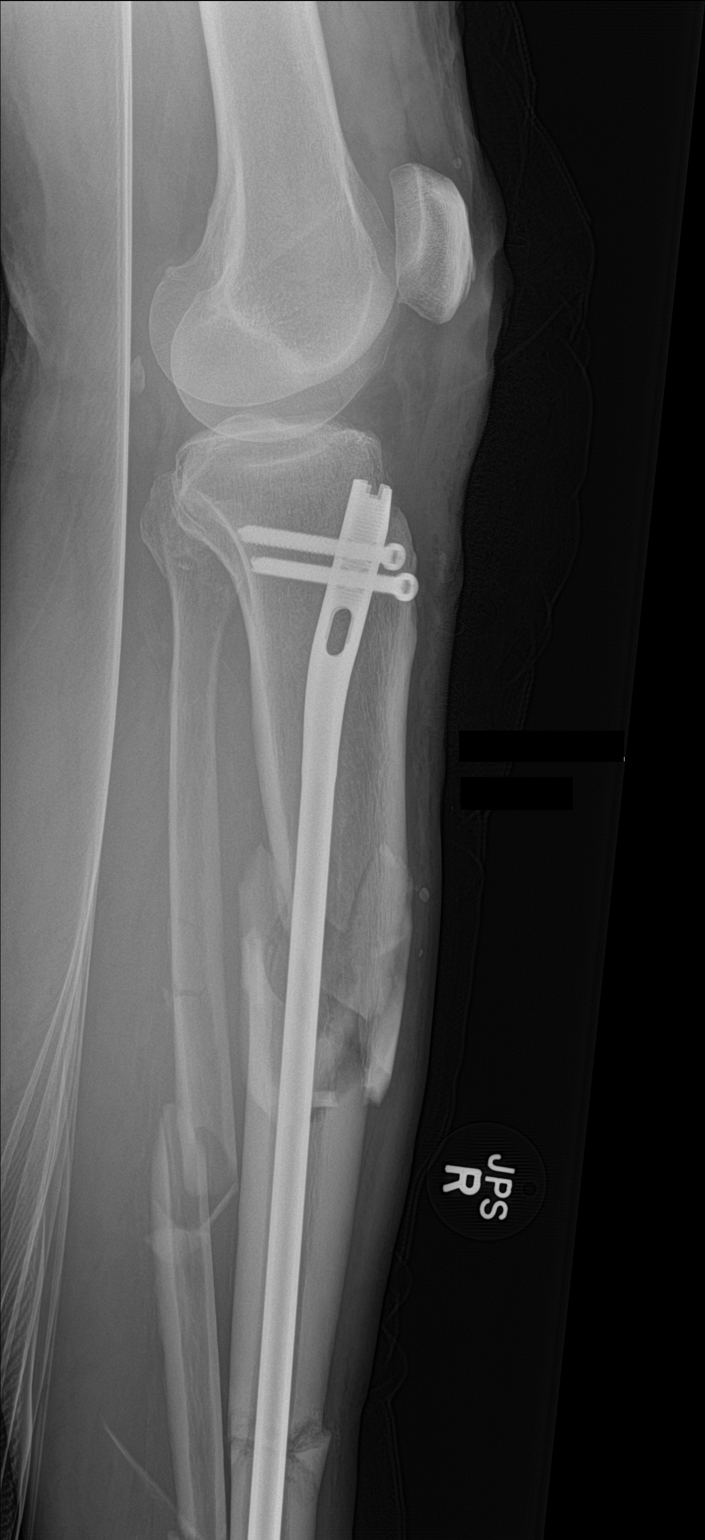

[tibia lat (2 of 2)]
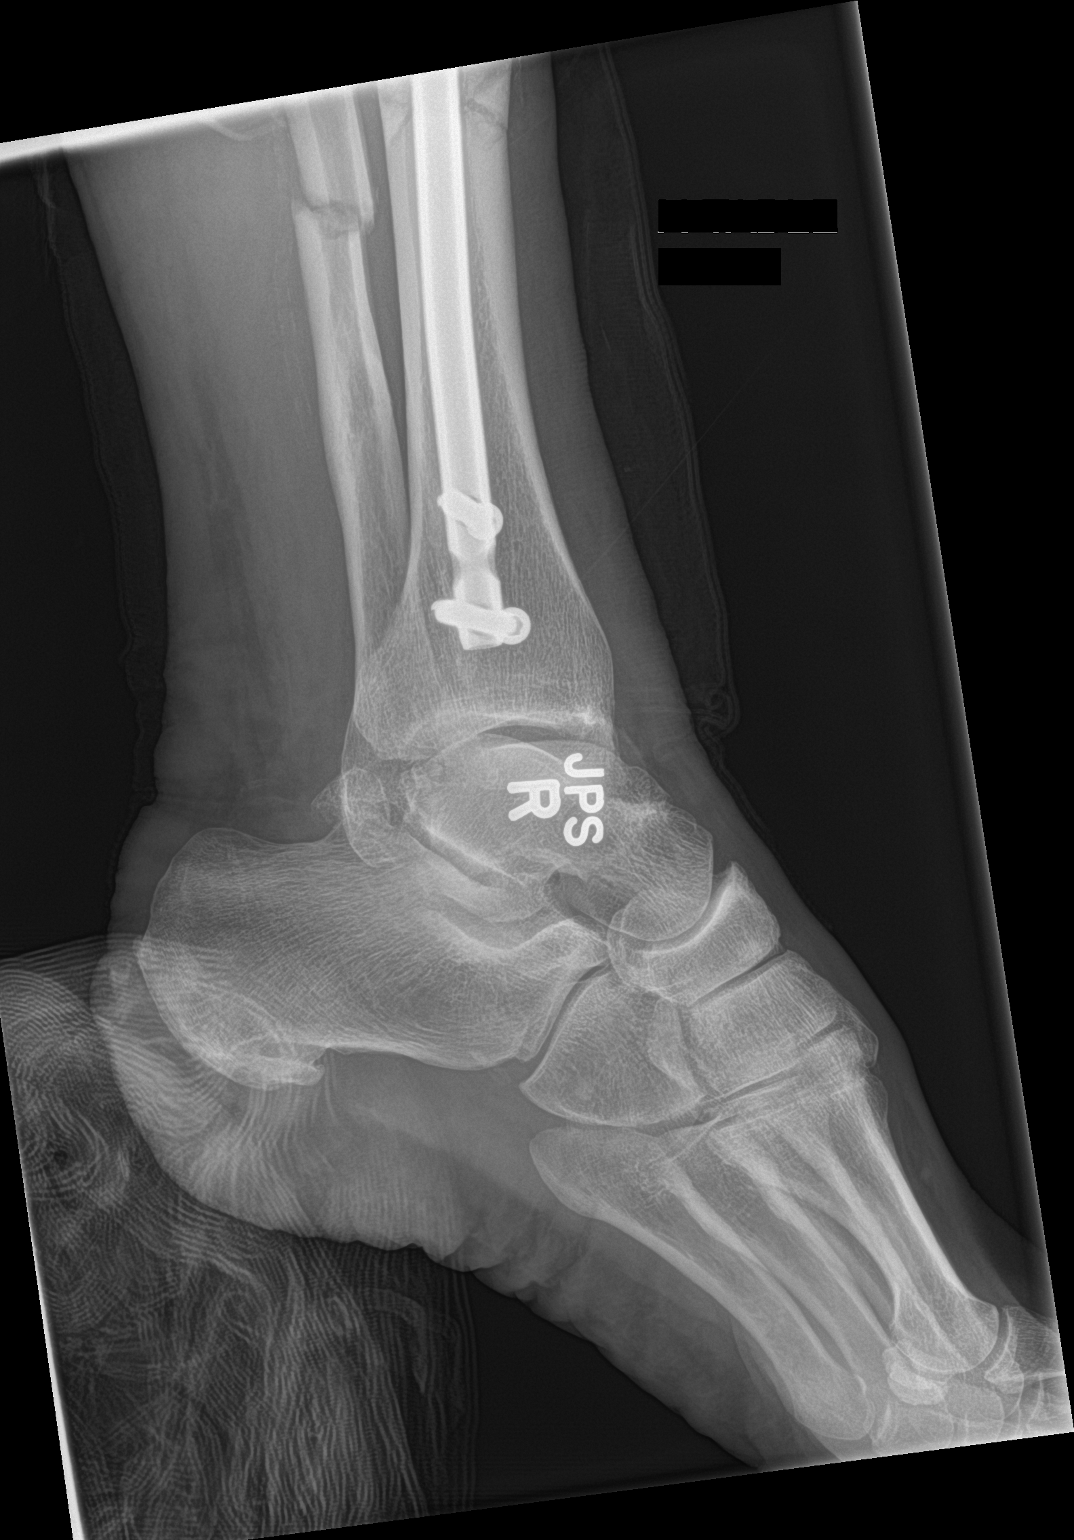

[3 of 3 positions shown; findings below may reference images not displayed]

FINDINGS: Comminuted diaphyseal fractures of the tibia and fibula are again
demonstrated. Antegrade intramedullary nail fixation with proximal
and distal interlocking screws across the tibia fractures with
improved alignment from the preoperative study, unchanged from the
intraoperative images. Fibular fracture also demonstrates much
improved alignment compared to the preoperative study. There is
stable to slightly increased lateral angulation of a fragment at the
level of the mid fibular diaphysis compared to the intraoperative
study. Slightly less than 1 shaft width posterior displacement of a
fibular fragment at the level of the mid diaphysis is similar to the
intraoperative study. Slight posterior displacement of the main
distal fragment is unchanged from the intraoperative study. An old,
healed distal fibular diaphyseal fracture is again noted. The knee
and ankle are located. There is diffuse soft tissue swelling in the
lower leg.
IMPRESSION: Comminuted tibia and fibular diaphyseal fractures with improved
alignment following reduction and tibial fixation as above.

## 2017-01-09 ENCOUNTER — Other Ambulatory Visit: Payer: Self-pay | Admitting: Internal Medicine

## 2017-01-10 ENCOUNTER — Encounter: Payer: Self-pay | Admitting: Internal Medicine

## 2017-01-10 ENCOUNTER — Telehealth: Payer: Self-pay

## 2017-01-10 NOTE — Telephone Encounter (Signed)
Was in patient for GI bleed. OK for colon in [place].

## 2017-01-10 NOTE — Telephone Encounter (Signed)
[name] [name] pt can be scheduled in the [facility] per Dr. Henrene Pastor, see note below.

## 2017-01-10 NOTE — Telephone Encounter (Signed)
Pt calling to schedule recall colon that is overdue. Pt was done at the hospital last time but do not see documentation as to why. Please advise if pt needs to be done at hospital.

## 2017-01-15 ENCOUNTER — Telehealth: Payer: Self-pay

## 2017-01-15 ENCOUNTER — Other Ambulatory Visit: Payer: Self-pay | Admitting: Physician Assistant

## 2017-01-15 NOTE — Telephone Encounter (Signed)
Pt's wife called wanting to know if [name] should not be taking his valsartan.   Per provider pt should stop the VALSARTAN.

## 2017-02-27 ENCOUNTER — Ambulatory Visit (AMBULATORY_SURGERY_CENTER): Payer: Self-pay | Admitting: *Deleted

## 2017-02-27 VITALS — Ht 67.0 in | Wt 228.4 lb

## 2017-02-27 DIAGNOSIS — Z86010 Personal history of colonic polyps: Secondary | ICD-10-CM

## 2017-02-27 MED ORDER — NA SULFATE-K SULFATE-MG SULF 17.5-3.13-1.6 GM/177ML PO SOLN: 1.0000 [IU] | Freq: Once | ORAL | 0 refills | Status: AC

## 2017-02-27 NOTE — Progress Notes (Signed)
No egg or soy allergy known to patient  No issues with past sedation with any surgeries  or procedures, no intubation problems  No diet pills per patient No home 02 use per patient  No blood thinners per patient  Pt denies issues with constipation  No A fib or A flutter  EMMI video sent to pt's e mail . Pt Declined  

## 2017-02-28 ENCOUNTER — Encounter: Payer: Self-pay | Admitting: Internal Medicine

## 2017-03-12 ENCOUNTER — Telehealth: Payer: Self-pay | Admitting: Internal Medicine

## 2017-03-12 NOTE — Telephone Encounter (Signed)
Corrine please advise what needs to be discussed with the pt.  Thanks

## 2017-03-14 ENCOUNTER — Ambulatory Visit (AMBULATORY_SURGERY_CENTER): Payer: PPO | Admitting: Internal Medicine

## 2017-03-14 ENCOUNTER — Encounter: Payer: Self-pay | Admitting: Internal Medicine

## 2017-03-14 VITALS — BP 149/86 | HR 55 | Temp 96.0°F | Resp 11 | Ht 67.0 in | Wt 228.0 lb

## 2017-03-14 DIAGNOSIS — Z86010 Personal history of colonic polyps: Secondary | ICD-10-CM | POA: Diagnosis not present

## 2017-03-14 DIAGNOSIS — E669 Obesity, unspecified: Secondary | ICD-10-CM | POA: Diagnosis not present

## 2017-03-14 DIAGNOSIS — I10 Essential (primary) hypertension: Secondary | ICD-10-CM | POA: Diagnosis not present

## 2017-03-14 DIAGNOSIS — Z1211 Encounter for screening for malignant neoplasm of colon: Secondary | ICD-10-CM | POA: Diagnosis not present

## 2017-03-14 DIAGNOSIS — E039 Hypothyroidism, unspecified: Secondary | ICD-10-CM | POA: Diagnosis not present

## 2017-03-14 DIAGNOSIS — G4733 Obstructive sleep apnea (adult) (pediatric): Secondary | ICD-10-CM | POA: Diagnosis not present

## 2017-03-14 MED ORDER — SODIUM CHLORIDE 0.9 % IV SOLN: 500.0000 mL | INTRAVENOUS | Status: DC

## 2017-03-14 NOTE — Op Note (Signed)
[place] Patient Name: [name] [name] Procedure Date: 03/14/2017 2:27 PM MRN: [id number] Endoscopist: Docia Chuck. Henrene Pastor , MD Age: 66 Referring MD:  Date of Birth: [birth year] Gender: Male Account #: [id number] Procedure:                Colonoscopy Indications:              High risk colon cancer surveillance: Personal                            history of non-advanced adenoma. Previous                            examinations 2003, 2007, 2012 Medicines:                Monitored Anesthesia Care Procedure:                Pre-Anesthesia Assessment:                           - Prior to the procedure, a History and Physical                            was performed, and patient medications and                            allergies were reviewed. The patient's tolerance of                            previous anesthesia was also reviewed. The risks                            and benefits of the procedure and the sedation                            options and risks were discussed with the patient.                            All questions were answered, and informed consent                            was obtained. Prior Anticoagulants: The patient has                            taken no previous anticoagulant or antiplatelet                            agents. ASA Grade Assessment: II - A patient with                            mild systemic disease. After reviewing the risks                            and benefits, the patient was deemed in  satisfactory condition to undergo the procedure.                           After obtaining informed consent, the colonoscope                            was passed under direct vision. Throughout the                            procedure, the patient's blood pressure, pulse, and                            oxygen saturations were monitored continuously. The                            Colonoscope was introduced through the anus  and                            advanced to the the cecum, identified by                            appendiceal orifice and ileocecal valve. The                            ileocecal valve, appendiceal orifice, and rectum                            were photographed. The quality of the bowel                            preparation was adequate to identify polyps 6 mm                            and larger in size. The colonoscopy was performed                            without difficulty. The patient tolerated the                            procedure well. The bowel preparation used was                            SUPREP. Scope In: 2:40:16 PM Scope Out: 2:57:09 PM Scope Withdrawal Time: 0 hours 13 minutes 24 seconds  Total Procedure Duration: 0 hours 16 minutes 53 seconds  Findings:                 Multiple small and large-mouthed diverticula were                            found in the left colon.                           Internal hemorrhoids were found during retroflexion.  The exam was otherwise without abnormality on                            direct and retroflexion views. Complications:            No immediate complications. Estimated blood loss:                            None. Estimated Blood Loss:     Estimated blood loss: none. Impression:               - Diverticulosis in the left colon.                           - Internal hemorrhoids.                           - The examination was otherwise normal on direct                            and retroflexion views.                           - No specimens collected. Recommendation:           - Repeat colonoscopy in 5 years for surveillance.                           - Patient has a contact number available for                            emergencies. The signs and symptoms of potential                            delayed complications were discussed with the                            patient. Return to normal  activities tomorrow.                            Written discharge instructions were provided to the                            patient.                           - Resume previous diet.                           - Continue present medications. Docia Chuck. Henrene Pastor, MD 03/14/2017 3:05:56 PM This report has been signed electronically.

## 2017-03-14 NOTE — Patient Instructions (Signed)
YOU HAD AN ENDOSCOPIC PROCEDURE TODAY AT [facility]:   Refer to the procedure report that was given to you for any specific questions about what was found during the examination.  If the procedure report does not answer your questions, please call your gastroenterologist to clarify.  If you requested that your care partner not be given the details of your procedure findings, then the procedure report has been included in a sealed envelope for you to review at your convenience later.  YOU SHOULD EXPECT: Some feelings of bloating in the abdomen. Passage of more gas than usual.  Walking can help get rid of the air that was put into your GI tract during the procedure and reduce the bloating. If you had a lower endoscopy (such as a colonoscopy or flexible sigmoidoscopy) you may notice spotting of blood in your stool or on the toilet paper. If you underwent a bowel prep for your procedure, you may not have a normal bowel movement for a few days.  Please Note:  You might notice some irritation and congestion in your nose or some drainage.  This is from the oxygen used during your procedure.  There is no need for concern and it should clear up in a day or so.  SYMPTOMS TO REPORT IMMEDIATELY:   Following lower endoscopy (colonoscopy or flexible sigmoidoscopy):  Excessive amounts of blood in the stool  Significant tenderness or worsening of abdominal pains  Swelling of the abdomen that is new, acute  Fever of 100F or higher  For urgent or emergent issues, a gastroenterologist can be reached at any hour by calling [phone].   DIET:  We do recommend a small meal at first, but then you may proceed to your regular diet.  Drink plenty of fluids but you should avoid alcoholic beverages for 24 hours.  ACTIVITY:  You should plan to take it easy for the rest of today and you should NOT DRIVE or use heavy machinery until tomorrow (because of the sedation medicines used during the test).     FOLLOW UP: Our staff will call the number listed on your records the next business day following your procedure to check on you and address any questions or concerns that you may have regarding the information given to you following your procedure. If we do not reach you, we will leave a message.  However, if you are feeling well and you are not experiencing any problems, there is no need to return our call.  We will assume that you have returned to your regular daily activities without incident.  If any biopsies were taken you will be contacted by phone or by letter within the next 1-3 weeks.  Please call us at [phone] if you have not heard about the biopsies in 3 weeks.   Repeat Colonoscopy in 5 years for screening Hemorrhoids (handout given) Diverticulosis (handout given)   SIGNATURES/CONFIDENTIALITY: You and/or your care partner have signed paperwork which will be entered into your electronic medical record.  These signatures attest to the fact that that the information above on your After Visit Summary has been reviewed and is understood.  Full responsibility of the confidentiality of this discharge information lies with you and/or your care-partner.

## 2017-03-14 NOTE — Progress Notes (Signed)
To recovery, report to RN, VSS. 

## 2017-03-17 ENCOUNTER — Telehealth: Payer: Self-pay | Admitting: *Deleted

## 2017-03-17 DIAGNOSIS — D225 Melanocytic nevi of trunk: Secondary | ICD-10-CM | POA: Diagnosis not present

## 2017-03-17 DIAGNOSIS — L570 Actinic keratosis: Secondary | ICD-10-CM | POA: Diagnosis not present

## 2017-03-17 DIAGNOSIS — L814 Other melanin hyperpigmentation: Secondary | ICD-10-CM | POA: Diagnosis not present

## 2017-03-17 DIAGNOSIS — D1801 Hemangioma of skin and subcutaneous tissue: Secondary | ICD-10-CM | POA: Diagnosis not present

## 2017-03-17 DIAGNOSIS — L918 Other hypertrophic disorders of the skin: Secondary | ICD-10-CM | POA: Diagnosis not present

## 2017-03-17 DIAGNOSIS — L821 Other seborrheic keratosis: Secondary | ICD-10-CM | POA: Diagnosis not present

## 2017-03-17 NOTE — Telephone Encounter (Signed)
lmtcb for pt- do not see where we contacted patient.

## 2017-03-17 NOTE — Telephone Encounter (Signed)
  Follow up Call-  Call back number 03/14/2017  Post procedure Call Back phone  # [phone]  Permission to leave phone message Yes  Some recent data might be hidden     Patient questions:  Do you have a fever, pain , or abdominal swelling? No. Pain Score  0 *  Have you tolerated food without any problems? Yes.    Have you been able to return to your normal activities? Yes.    Do you have any questions about your discharge instructions: Diet   No. Medications  No. Follow up visit  No.  Do you have questions or concerns about your Care? No.  Actions: * If pain score is 4 or above: No action needed, pain <4.

## 2017-03-31 ENCOUNTER — Other Ambulatory Visit: Payer: Self-pay

## 2017-03-31 NOTE — Telephone Encounter (Signed)
Prescription refill request.

## 2017-04-02 ENCOUNTER — Other Ambulatory Visit: Payer: Self-pay | Admitting: Physician Assistant

## 2017-04-02 ENCOUNTER — Other Ambulatory Visit: Payer: Self-pay | Admitting: *Deleted

## 2017-04-02 MED ORDER — METOPROLOL TARTRATE 25 MG PO TABS: 25.0000 mg | ORAL_TABLET | Freq: Two times a day (BID) | ORAL | 1 refills | Status: DC

## 2017-04-02 NOTE — Telephone Encounter (Signed)
Left message for patient to call back  

## 2017-04-03 NOTE — Telephone Encounter (Signed)
Spoke with patient, verified that he is taking 1/2 tablet, BID. Patient stated that he had picked up an rx yesterday for this.

## 2017-04-08 NOTE — Patient Instructions (Signed)

## 2017-04-08 NOTE — Progress Notes (Signed)
This very nice 66 y.o. MWM presents for 3 month follow up with Hypertension, Hyperlipidemia, Pre-Diabetes, Hypothyroidism  and Vitamin D Deficiency.  Patient is on CPAP for OSA with improved sleep hygiene alleging 100% compliance.      Patient is treated for HTN (1980's) & BP has been controlled at home. Today's BP is elevated at 155/98 by Nurse and 160/100 by my recheck. Patient has had no complaints of any cardiac type chest pain, palpitations, dyspnea / orthopnea / PND, dizziness, claudication, or dependent edema.     Hyperlipidemia is still not controlled with diet & meds. Patient is on Zetia, but is Statin intolerant with elevated CPK. Patient denies myalgias or other med SE's. Last Lipids were not at goal : Lab Results  Component Value Date   CHOL 241 (H) 04/09/2017   HDL 42 04/09/2017   LDLCALC 122 (H) 01/01/2017   TRIG 410 (H) 04/09/2017   CHOLHDL 5.7 (H) 04/09/2017      Also, the patient has history of PreDiabetes (A1c 5.9% / 2015) and has had no symptoms of reactive hypoglycemia, diabetic polys, paresthesias or visual blurring.  Last A1c was normal & at  goal: Lab Results  Component Value Date   HGBA1C 5.4 09/19/2016      Further, the patient also has history of Vitamin D Deficiency and supplements vitamin D without any suspected side-effects. Last vitamin D was at goal:   Lab Results  Component Value Date   VD25OH 36 09/19/2016   Current Outpatient Medications on File Prior to Visit  Medication Sig  . Ascorbic Acid (VITAMIN C) 1000 MG tablet Take 1,000 mg by mouth daily.   Marland Kitchen aspirin 325 MG tablet Take 325 mg by mouth daily.    . B Complex Vitamins (B COMPLEX PO) Take 1 tablet by mouth See admin instructions. Takes M,W,F  . EPINEPHrine (EPIPEN 2-PAK) 0.3 mg/0.3 mL IJ SOAJ injection Inject 0.3 mLs (0.3 mg total) into the muscle once.  . ezetimibe (ZETIA) 10 MG tablet Take 1 tablet (10 mg total) by mouth daily.  . fish oil-omega-3 fatty acids 1000 MG capsule Take 2 g by  mouth 3 (three) times daily.   . Flaxseed, Linseed, (FLAX SEED OIL) 1000 MG CAPS Take 2,000 mg by mouth daily.  Marland Kitchen levothyroxine (SYNTHROID, LEVOTHROID) 50 MCG tablet TAKE 1 TABLET BY MOUTH EVERY DAY  . MAGNESIUM PO Take 750 mg by mouth daily.   . metoprolol tartrate (LOPRESSOR) 25 MG tablet Take 1 tablet (25 mg total) 2 (two) times daily by mouth. (Patient taking differently: Take 12.5 mg 2 (two) times daily by mouth. )  . Multiple Vitamin (MULTIVITAMIN) capsule Take by mouth.  . sertraline (ZOLOFT) 100 MG tablet TAKE ONE AND ONE-HALF TO TWO TABLETS DAILY AS DIRECTED FOR MOOD   No current facility-administered medications on file prior to visit.    Allergies  Allergen Reactions  . Ace Inhibitors Other (See Comments)    cough  . Bee Venom Other (See Comments)   PMHx:   Past Medical History:  Diagnosis Date  . Cardiomegaly   . Colon polyp   . Depression   . Fatty liver disease, nonalcoholic   . Gallstones   . Hypertension   . Hypogonadism male   . Mixed hyperlipidemia   . Obesity   . Pancreas (digestive gland) works poorly   . Prediabetes   . Sleep apnea   . Thyroid disease   . Vitamin D deficiency    Immunization History  Administered Date(s) Administered  . Influenza Split 03/06/2012, 02/24/2013, 03/18/2014  . Influenza, High Dose Seasonal PF 02/12/2016, 04/09/2017  . Influenza-Unspecified 03/03/2015  . PPD Test 07/26/2013  . Pneumococcal Conjugate-13 02/12/2016  . Pneumococcal Polysaccharide-23 07/26/2013  . Pneumococcal-Unspecified 05/04/2004  . Tdap 07/24/2011  . Zoster 12/02/2013   Past Surgical History:  Procedure Laterality Date  . ANKLE FRACTURE SURGERY Left 1986  . APPENDECTOMY  1964  . CARPAL TUNNEL RELEASE Bilateral 1996  . CHOLECYSTECTOMY  1976  . COLONOSCOPY    . POLYPECTOMY     FHx:    Reviewed / unchanged  SHx:    Reviewed / unchanged  Systems Review:  Constitutional: Denies fever, chills, wt changes, headaches, insomnia, fatigue, night  sweats, change in appetite. Eyes: Denies redness, blurred vision, diplopia, discharge, itchy, watery eyes.  ENT: Denies discharge, congestion, post nasal drip, epistaxis, sore throat, earache, hearing loss, dental pain, tinnitus, vertigo, sinus pain, snoring.  CV: Denies chest pain, palpitations, irregular heartbeat, syncope, dyspnea, diaphoresis, orthopnea, PND, claudication or edema. Respiratory: denies cough, dyspnea, DOE, pleurisy, hoarseness, laryngitis, wheezing.  Gastrointestinal: Denies dysphagia, odynophagia, heartburn, reflux, water brash, abdominal pain or cramps, nausea, vomiting, bloating, diarrhea, constipation, hematemesis, melena, hematochezia  or hemorrhoids. Genitourinary: Denies dysuria, frequency, urgency, nocturia, hesitancy, discharge, hematuria or flank pain. Musculoskeletal: Denies arthralgias, myalgias, stiffness, jt. swelling, pain, limping or strain/sprain.  Skin: Denies pruritus, rash, hives, warts, acne, eczema or change in skin lesion(s). Neuro: No weakness, tremor, incoordination, spasms, paresthesia or pain. Psychiatric: Denies confusion, memory loss or sensory loss. Endo: Denies change in weight, skin or hair change.  Heme/Lymph: No excessive bleeding, bruising or enlarged lymph nodes.  Physical Exam  BP (!) 155/98   Pulse 66   Temp (!) 96.8 F (36 C)   Ht 5\' 7"  (1.702 m)   Wt 226 lb 6.4 oz (102.7 kg)   SpO2 96%   BMI 35.46 kg/m   Appears well nourished, well groomed  and in no distress.  Eyes: PERRLA, EOMs, conjunctiva no swelling or erythema. Sinuses: No frontal/maxillary tenderness ENT/Mouth: EAC's clear, TM's nl w/o erythema, bulging. Nares clear w/o erythema, swelling, exudates. Oropharynx clear without erythema or exudates. Oral hygiene is good. Tongue normal, non obstructing. Hearing intact.  Neck: Supple. Thyroid nl. Car 2+/2+ without bruits, nodes or JVD. Chest: Respirations nl with BS clear & equal w/o rales, rhonchi, wheezing or stridor.    Cor: Heart sounds normal w/ regular rate and rhythm without sig. murmurs, gallops, clicks or rubs. Peripheral pulses normal and equal  without edema.  Abdomen: Soft & bowel sounds normal. Non-tender w/o guarding, rebound, hernias, masses or organomegaly.  Lymphatics: Unremarkable.  Musculoskeletal: Full ROM all peripheral extremities, joint stability, 5/5 strength and normal gait.  Skin: Warm, dry without exposed rashes, lesions or ecchymosis apparent.  Neuro: Cranial nerves intact, reflexes equal bilaterally. Sensory-motor testing grossly intact. Tendon reflexes grossly intact.  Pysch: Alert & oriented x 3.  Insight and judgement nl & appropriate. No ideations.  Assessment and Plan:  1. Essential hypertension  - Continue medication, monitor blood pressure at home.  - Continue DASH diet. Reminder to go to the ER if any CP,  SOB, nausea, dizziness, severe HA, changes vision/speech.  - CBC with Differential/Platelet - BASIC METABOLIC PANEL WITH GFR - Magnesium - TSH  2. Hyperlipidemia, mixed  - Continue diet/meds, exercise,& lifestyle modifications.  - Continue monitor periodic cholesterol/liver & renal functions   - Hepatic function panel - Lipid panel  3. Prediabetes  - Continue diet, exercise,  lifestyle modifications.  - Monitor appropriate labs.  - Hemoglobin A1c - Insulin, random  4. Vitamin D deficiency  - Continue supplementation.  - VITAMIN D 25 Hydroxy   5. Hypothyroidism, unspecified type  - TSH  6. Obstructive sleep apnea   7. Medication management  - CBC with Differential/Platelet - BASIC METABOLIC PANEL WITH GFR - Hepatic function panel - Magnesium - Lipid panel - TSH - Hemoglobin A1c - Insulin, random - VITAMIN D 25 Hydroxy   8. Needs flu shot  - Flu vaccine HIGH DOSE PF        Discussed  regular exercise, BP monitoring, weight control to achieve/maintain BMI less than 25 and discussed med and SE's. Recommended labs to assess and monitor  clinical status with further disposition pending results of labs. Over 30 minutes of exam, counseling, chart review was performed.

## 2017-04-09 ENCOUNTER — Encounter: Payer: Self-pay | Admitting: Internal Medicine

## 2017-04-09 ENCOUNTER — Ambulatory Visit: Payer: PPO | Admitting: Internal Medicine

## 2017-04-09 VITALS — BP 155/98 | HR 66 | Temp 96.8°F | Ht 67.0 in | Wt 226.4 lb

## 2017-04-09 DIAGNOSIS — G4733 Obstructive sleep apnea (adult) (pediatric): Secondary | ICD-10-CM

## 2017-04-09 DIAGNOSIS — E782 Mixed hyperlipidemia: Secondary | ICD-10-CM | POA: Diagnosis not present

## 2017-04-09 DIAGNOSIS — I10 Essential (primary) hypertension: Secondary | ICD-10-CM | POA: Diagnosis not present

## 2017-04-09 DIAGNOSIS — E559 Vitamin D deficiency, unspecified: Secondary | ICD-10-CM | POA: Diagnosis not present

## 2017-04-09 DIAGNOSIS — Z23 Encounter for immunization: Secondary | ICD-10-CM | POA: Diagnosis not present

## 2017-04-09 DIAGNOSIS — E039 Hypothyroidism, unspecified: Secondary | ICD-10-CM | POA: Diagnosis not present

## 2017-04-09 DIAGNOSIS — Z79899 Other long term (current) drug therapy: Secondary | ICD-10-CM

## 2017-04-09 DIAGNOSIS — R7303 Prediabetes: Secondary | ICD-10-CM | POA: Diagnosis not present

## 2017-04-10 ENCOUNTER — Other Ambulatory Visit: Payer: Self-pay | Admitting: Internal Medicine

## 2017-04-10 LAB — CBC WITH DIFFERENTIAL/PLATELET
Basophils Absolute: 52 cells/uL (ref 0–200)
Basophils Relative: 1 %
Eosinophils Absolute: 161 cells/uL (ref 15–500)
Eosinophils Relative: 3.1 %
HCT: 46.7 % (ref 38.5–50.0)
Hemoglobin: 16.2 g/dL (ref 13.2–17.1)
Lymphs Abs: 1113 cells/uL (ref 850–3900)
MCH: 30.5 pg (ref 27.0–33.0)
MCHC: 34.7 g/dL (ref 32.0–36.0)
MCV: 87.8 fL (ref 80.0–100.0)
MPV: 10.1 fL (ref 7.5–12.5)
Monocytes Relative: 12.5 %
Neutro Abs: 3224 cells/uL (ref 1500–7800)
Neutrophils Relative %: 62 %
Platelets: 235 10*3/uL (ref 140–400)
RBC: 5.32 10*6/uL (ref 4.20–5.80)
RDW: 13.9 % (ref 11.0–15.0)
Total Lymphocyte: 21.4 %
WBC mixed population: 650 cells/uL (ref 200–950)
WBC: 5.2 10*3/uL (ref 3.8–10.8)

## 2017-04-10 LAB — TSH: TSH: 1.7 m[IU]/L (ref 0.40–4.50)

## 2017-04-10 LAB — HEPATIC FUNCTION PANEL
AG Ratio: 1.8 (calc) (ref 1.0–2.5)
ALT: 40 U/L (ref 9–46)
AST: 33 U/L (ref 10–35)
Albumin: 4.8 g/dL (ref 3.6–5.1)
Alkaline phosphatase (APISO): 70 U/L (ref 40–115)
Bilirubin, Direct: 0.1 mg/dL (ref 0.0–0.2)
Globulin: 2.6 g/dL (calc) (ref 1.9–3.7)
Indirect Bilirubin: 0.4 mg/dL (calc) (ref 0.2–1.2)
Total Bilirubin: 0.5 mg/dL (ref 0.2–1.2)
Total Protein: 7.4 g/dL (ref 6.1–8.1)

## 2017-04-10 LAB — LIPID PANEL
CHOL/HDL RATIO: 5.7 (calc) — AB (ref <5.0)
Cholesterol: 241 mg/dL — ABNORMAL HIGH (ref <200)
HDL: 42 mg/dL (ref >40)
Non-HDL Cholesterol (Calc): 199 mg/dL (calc) — ABNORMAL HIGH (ref <130)
TRIGLYCERIDES: 410 mg/dL — AB (ref <150)

## 2017-04-10 LAB — HEMOGLOBIN A1C
Hgb A1c MFr Bld: 5.6 % of total Hgb (ref <5.7)
Mean Plasma Glucose: 114 (calc)
eAG (mmol/L): 6.3 (calc)

## 2017-04-10 LAB — BASIC METABOLIC PANEL WITH GFR
BUN: 16 mg/dL (ref 7–25)
CO2: 30 mmol/L (ref 20–32)
Calcium: 10 mg/dL (ref 8.6–10.3)
Chloride: 101 mmol/L (ref 98–110)
Creat: 0.89 mg/dL (ref 0.70–1.25)
GFR, Est African American: 103 mL/min/{1.73_m2} (ref >=60)
GFR, Est Non African American: 89 mL/min/{1.73_m2} (ref >=60)
Glucose, Bld: 93 mg/dL (ref 65–99)
Potassium: 4.5 mmol/L (ref 3.5–5.3)
Sodium: 141 mmol/L (ref 135–146)

## 2017-04-10 LAB — MAGNESIUM: Magnesium: 2.2 mg/dL (ref 1.5–2.5)

## 2017-04-10 LAB — VITAMIN D 25 HYDROXY (VIT D DEFICIENCY, FRACTURES): Vit D, 25-Hydroxy: 68 ng/mL (ref 30–100)

## 2017-04-10 LAB — INSULIN, RANDOM: INSULIN: 14.5 u[IU]/mL (ref 2.0–19.6)

## 2017-04-10 MED ORDER — GEMFIBROZIL 600 MG PO TABS: ORAL_TABLET | ORAL | 1 refills | Status: DC

## 2017-04-11 NOTE — Progress Notes (Signed)
LVM for pt to return office call for LAB results.

## 2017-04-16 ENCOUNTER — Ambulatory Visit: Payer: PPO | Admitting: Internal Medicine

## 2017-04-18 ENCOUNTER — Other Ambulatory Visit: Payer: Self-pay | Admitting: Internal Medicine

## 2017-06-13 ENCOUNTER — Other Ambulatory Visit: Payer: Self-pay | Admitting: Internal Medicine

## 2017-07-07 ENCOUNTER — Other Ambulatory Visit: Payer: Self-pay | Admitting: *Deleted

## 2017-07-07 MED ORDER — LEVOTHYROXINE SODIUM 50 MCG PO TABS: 50.0000 ug | ORAL_TABLET | Freq: Every day | ORAL | 1 refills | Status: DC

## 2017-07-08 NOTE — Progress Notes (Signed)
MEDICARE WELLNESS VISIT  Assessment and Plan: Essential hypertension - ADD ON BENICAR 20MG  1/2 PILL CAN INCREASE TO 20MG  - DASH diet, exercise and monitor at home. Call if greater than 130/80.  - CBC with Differential/Platelet - BASIC METABOLIC PANEL WITH GFR - Hepatic function panel - Urinalysis, Routine w reflex microscopic - Microalbumin / creatinine urine ratio - EKG 12-Lead   Hypothyroidism, unspecified hypothyroidism type Hypothyroidism-check TSH level, continue medications the same, reminded to take on an empty stomach 30-35mins before food.  - TSH  Prediabetes Discussed general issues about diabetes pathophysiology and management., Educational material distributed., Suggested low cholesterol diet., Encouraged aerobic exercise., Discussed foot care., Reminded to get yearly retinal exam. - Hemoglobin A1c   Hyperlipidemia -continue medications, check lipids, decrease fatty foods, increase activity.  - Lipid panel  Morbid Obesity - follow up 3 months for progress monitoring - increase veggies, decrease carbs - long discussion about weight loss, diet, and exercise   Vitamin D deficiency - Vit D  25 hydroxy (rtn osteoporosis monitoring)  Testosterone deficiency - Testosterone  Medication management - Magnesium  Left foot drop Monitor, follow up ortho  myositis MONITOR  Obstructive sleep apnea Continue CPAP  Erectile dysfunction, unspecified erectile dysfunction type Continue testosterone and cialis PRN   Major depression in remission Continue zoloft WILL ADD ON WELLBUTRIN FOR SEASONAL AFFECTIVE DO  Discussed med's effects and SE's. Screening labs and tests as requested with regular follow-up as recommended. Over 40 minutes of exam, counseling, chart review and critical decision making was performed Future Appointments  Date Time Provider [place]  08/28/2017  4:30 PM Deneise Lever, MD [facility] None  01/05/2018  9:00 AM Vicie Mutters, PA-C  [facility] None    Plan:   During the course of the visit the patient was educated and counseled about appropriate screening and preventive services including:    Pneumococcal vaccine   Prevnar 13  Influenza vaccine  Td vaccine  Screening electrocardiogram  Bone densitometry screening  Colorectal cancer screening  Diabetes screening  Glaucoma screening  Nutrition counseling   Advanced directives: requested  HPI Patient presents for a medicare wellness visit.   His blood pressure has NOT been controlled at home, he is on metoprolol 25mg  BID, has come down down some but at goal, today their BP is BP: (!) 166/90  He has a history of LVH with normal EF, last echo 2011, and normal cardiolite 2014. He is on ASA, BB, and fluid pill.  He is retired, he had tib/fib fracture s/p surgery 03/21/16.  He states he has had a diverticulitis flare but went to liquid diet/bland diet and doing better.   He does not workout.  He denies chest pain, shortness of breath, dizziness.  He is on cholesterol medication and denies myalgias, had intolerant to statins due to elevated CPK so on zetia.  His cholesterol is at goal. The cholesterol last visit was:   Lab Results  Component Value Date   CHOL 241 (H) 04/09/2017   HDL 42 04/09/2017   LDLCALC 122 (H) 01/01/2017   TRIG 410 (H) 04/09/2017   CHOLHDL 5.7 (H) 04/09/2017  He has been working on diet and exercise for prediabetes,  and denies paresthesia of the feet, polydipsia, polyuria and visual disturbances. Last A1C in the office was:  Lab Results  Component Value Date   HGBA1C 5.6 04/09/2017  Patient is on Vitamin D supplement.   Lab Results  Component Value Date   VD25OH 90 04/09/2017  He is on  zoloft for depression, 1.5 daily.   He is on thyroid medication. His medication was not changed last visit.   Lab Results  Component Value Date   TSH 1.70 04/09/2017   Last PSA was, follows with Dr. Suann Larry PRN, has had elevated PSA while  on testosterone.  Lab Results  Component Value Date   PSA 0.6 01/01/2017  BMI is Body mass index is 34.86 kg/m., he is working on diet and exercise, he has sleep apnea with CPAP and hypogonadism consequence of his obesity. He is working at the [employer]. Wt Readings from Last 3 Encounters:  07/10/17 222 lb 9.6 oz (101 kg)  04/09/17 226 lb 6.4 oz (102.7 kg)  03/14/17 228 lb (103.4 kg)    Current Medications:  Current Outpatient Medications on File Prior to Visit  Medication Sig Dispense Refill  . Ascorbic Acid (VITAMIN C) 1000 MG tablet Take 1,000 mg by mouth daily.     Marland Kitchen aspirin 325 MG tablet Take 325 mg by mouth daily.      . B Complex Vitamins (B COMPLEX PO) Take 1 tablet by mouth See admin instructions. Takes M,W,F    . EPINEPHrine (EPIPEN 2-PAK) 0.3 mg/0.3 mL IJ SOAJ injection Inject 0.3 mLs (0.3 mg total) into the muscle once. 2 Device 0  . ezetimibe (ZETIA) 10 MG tablet TAKE 1 TABLET (10 MG TOTAL) BY MOUTH DAILY. 90 tablet 3  . fish oil-omega-3 fatty acids 1000 MG capsule Take 2 g by mouth 3 (three) times daily.     . Flaxseed, Linseed, (FLAX SEED OIL) 1000 MG CAPS Take 2,000 mg by mouth daily.    Marland Kitchen gemfibrozil (LOPID) 600 MG tablet Take 1 tablet 2 x / day with meals for Cholesterol 180 tablet 1  . levothyroxine (SYNTHROID, LEVOTHROID) 50 MCG tablet Take 1 tablet (50 mcg total) by mouth daily. 90 tablet 1  . MAGNESIUM PO Take 750 mg by mouth daily.     . metoprolol tartrate (LOPRESSOR) 25 MG tablet Take 1 tablet (25 mg total) 2 (two) times daily by mouth. (Patient taking differently: Take 12.5 mg 2 (two) times daily by mouth. ) 180 tablet 1  . Multiple Vitamin (MULTIVITAMIN) capsule Take by mouth.    . sertraline (ZOLOFT) 100 MG tablet TAKE ONE AND ONE-HALF TO TWO TABLETS DAILY AS DIRECTED FOR MOOD 180 tablet 1   No current facility-administered medications on file prior to visit.    Health Maintenance:  Immunization History  Administered Date(s) Administered  .  Influenza Split 03/06/2012, 02/24/2013, 03/18/2014  . Influenza, High Dose Seasonal PF 02/12/2016, 04/09/2017  . Influenza-Unspecified 03/03/2015  . PPD Test 07/26/2013  . Pneumococcal Conjugate-13 02/12/2016  . Pneumococcal Polysaccharide-23 07/26/2013  . Pneumococcal-Unspecified 05/04/2004  . Tdap 07/24/2011  . Zoster 12/02/2013   Influenza 2018 TDAP 2013 Pneumovax 2015 Prevnar: 2017 Shingles vaccine: 2015  Colonoscopy 03/14/2017 EYE Exam WNL 2017, has one friday ECHO 08/06/09 Neg cardiolite 2014 CXR 06/30/12 WNL U/S ABD 08/01/11- FATTY LIVER  Patient Care Team: Unk Pinto, MD as PCP - General (Internal Medicine) Rana Snare, MD as Consulting Physician (Urology) Irene Shipper, MD as Consulting Physician (Gastroenterology) Melvyn Novas, MD as Referring Physician (Neurology) Lavonna Monarch, MD as Consulting Physician (Dermatology) Dyke Maes, OD (Optometry) Josue Hector, MD as Consulting Physician (Cardiology)  Medical History:  Past Medical History:  Diagnosis Date  . Cardiomegaly   . Colon polyp   . Depression   . Fatty liver disease, nonalcoholic   . Gallstones   .  Hypertension   . Hypogonadism male   . Mixed hyperlipidemia   . Obesity   . Pancreas (digestive gland) works poorly   . Prediabetes   . Sleep apnea   . Thyroid disease   . Vitamin D deficiency    Allergies Allergies  Allergen Reactions  . Ace Inhibitors Other (See Comments)    cough  . Bee Venom Other (See Comments)    SURGICAL HISTORY He  has a past surgical history that includes Cholecystectomy (1976); Appendectomy (1964); Ankle fracture surgery (Left, 1986); Carpal tunnel release (Bilateral, 1996); Tibia IM nail insertion (Right, 03/21/2016); Colonoscopy; and Polypectomy. FAMILY HISTORY His family history includes Diabetes in his father, mother, and sister; Heart disease in his father; Hypertension in his father. SOCIAL HISTORY He  reports that he quit smoking about  43 years ago. His smoking use included cigarettes. He has a 14.00 pack-year smoking history. he has never used smokeless tobacco. He reports that he does not drink alcohol or use drugs.   MEDICARE WELLNESS OBJECTIVES: Physical activity:   Cardiac risk factors:   Depression/mood screen:   Depression screen [facility] 2/9 04/09/2017  Decreased Interest 0  Down, Depressed, Hopeless 0  PHQ - 2 Score 0    ADLs:  In your present state of health, do you have any difficulty performing the following activities: 04/09/2017  Hearing? N  Vision? N  Difficulty concentrating or making decisions? N  Walking or climbing stairs? N  Dressing or bathing? N  Doing errands, shopping? N  Some recent data might be hidden     Cognitive Testing  Alert? Yes  Normal Appearance?Yes  Oriented to person? Yes  Place? Yes   Time? Yes  Recall of three objects?  Yes  Can perform simple calculations? Yes  Displays appropriate judgment?Yes  Can read the correct time from a watch face?Yes  EOL planning: Does Patient Have a Medical Advance Directive?: No Would patient like information on creating a medical advance directive?: Yes (MAU/Ambulatory/Procedural Areas - Information given)  Review of Systems:  Review of Systems  Constitutional: Negative.   HENT: Negative.   Eyes: Negative.   Respiratory: Negative.   Cardiovascular: Negative.   Gastrointestinal: Negative.   Genitourinary: Negative.   Musculoskeletal: Positive for myalgias.  Skin: Negative.   Neurological: Negative.   Endo/Heme/Allergies: Negative.   Psychiatric/Behavioral: Negative.     Physical Exam: Estimated body mass index is 34.86 kg/m as calculated from the following:   Height as of this encounter: 5\' 7"  (1.702 m).   Weight as of this encounter: 222 lb 9.6 oz (101 kg). BP (!) 166/90   Pulse 60   Temp 97.7 F (36.5 C)   Resp 16   Ht 5\' 7"  (1.702 m)   Wt 222 lb 9.6 oz (101 kg)   SpO2 99%   BMI 34.86 kg/m  General Appearance: Well  nourished, in no apparent distress.  Eyes: PERRLA, EOMs, conjunctiva no swelling or erythema, normal fundi and vessels.  Sinuses: No Frontal/maxillary tenderness  ENT/Mouth: Ext aud canals clear except right ear with cerumen impaction, normal light reflex with TMs without erythema, bulging. Good dentition. No erythema, swelling, or exudate on post pharynx. Tonsils not swollen or erythematous. Hearing normal.  Neck: Supple, thyroid normal. No bruits  Respiratory: Respiratory effort normal, BS equal bilaterally without rales, rhonchi, wheezing or stridor.  Cardio: RRR without murmurs, rubs or gallops. Brisk peripheral pulses without edema.  Chest: symmetric, with normal excursions and percussion.  Abdomen: Soft, obese, nontender, with large vertical  and right horizontal scar no guarding, rebound, hernias, masses, or organomegaly.  Lymphatics: Non tender without lymphadenopathy.  Genitourinary: defer Musculoskeletal: Full ROM all peripheral extremities,5/5 strength, and normal gait.  Skin:   Warm, dry without rashes, lesions, ecchymosis. Neuro: Cranial nerves intact, reflexes decreased bilateral legs, worse left than right with decrease dorsiflexion/plantar on left. Normal muscle tone, no cerebellar symptoms. Sensation intact.  Psych: Awake and oriented X 3, normal affect, Insight and Judgment appropriate.    Medicare Attestation I have personally reviewed: The patient's medical and social history Their use of alcohol, tobacco or illicit drugs Their current medications and supplements The patient's functional ability including ADLs,fall risks, home safety risks, cognitive, and hearing and visual impairment Diet and physical activities Evidence for depression or mood disorders  The patient's weight, height, BMI, and visual acuity have been recorded in the chart.  I have made referrals, counseling, and provided education to the patient based on review of the above and I have provided the patient  with a written personalized care plan for preventive services.     Vicie Mutters 8:57 AM [facility]

## 2017-07-10 ENCOUNTER — Ambulatory Visit (INDEPENDENT_AMBULATORY_CARE_PROVIDER_SITE_OTHER): Payer: PPO | Admitting: Physician Assistant

## 2017-07-10 ENCOUNTER — Encounter: Payer: Self-pay | Admitting: Physician Assistant

## 2017-07-10 VITALS — BP 166/90 | HR 60 | Temp 97.7°F | Resp 16 | Ht 67.0 in | Wt 222.6 lb

## 2017-07-10 DIAGNOSIS — R7309 Other abnormal glucose: Secondary | ICD-10-CM | POA: Diagnosis not present

## 2017-07-10 DIAGNOSIS — Z79899 Other long term (current) drug therapy: Secondary | ICD-10-CM

## 2017-07-10 DIAGNOSIS — Z6834 Body mass index (BMI) 34.0-34.9, adult: Secondary | ICD-10-CM

## 2017-07-10 DIAGNOSIS — Z0001 Encounter for general adult medical examination with abnormal findings: Secondary | ICD-10-CM

## 2017-07-10 DIAGNOSIS — I10 Essential (primary) hypertension: Secondary | ICD-10-CM

## 2017-07-10 DIAGNOSIS — R6889 Other general symptoms and signs: Secondary | ICD-10-CM

## 2017-07-10 DIAGNOSIS — F325 Major depressive disorder, single episode, in full remission: Secondary | ICD-10-CM

## 2017-07-10 DIAGNOSIS — N529 Male erectile dysfunction, unspecified: Secondary | ICD-10-CM | POA: Diagnosis not present

## 2017-07-10 DIAGNOSIS — E349 Endocrine disorder, unspecified: Secondary | ICD-10-CM | POA: Diagnosis not present

## 2017-07-10 DIAGNOSIS — E039 Hypothyroidism, unspecified: Secondary | ICD-10-CM | POA: Diagnosis not present

## 2017-07-10 DIAGNOSIS — M609 Myositis, unspecified: Secondary | ICD-10-CM

## 2017-07-10 DIAGNOSIS — E782 Mixed hyperlipidemia: Secondary | ICD-10-CM | POA: Diagnosis not present

## 2017-07-10 DIAGNOSIS — E559 Vitamin D deficiency, unspecified: Secondary | ICD-10-CM | POA: Diagnosis not present

## 2017-07-10 DIAGNOSIS — Z0000 Encounter for general adult medical examination without abnormal findings: Secondary | ICD-10-CM

## 2017-07-10 DIAGNOSIS — G4733 Obstructive sleep apnea (adult) (pediatric): Secondary | ICD-10-CM

## 2017-07-10 MED ORDER — OLMESARTAN MEDOXOMIL 20 MG PO TABS: 20.0000 mg | ORAL_TABLET | Freq: Every day | ORAL | 2 refills | Status: DC

## 2017-07-10 MED ORDER — BUPROPION HCL ER (XL) 150 MG PO TB24: 150.0000 mg | ORAL_TABLET | ORAL | 2 refills | Status: DC

## 2017-07-10 NOTE — Patient Instructions (Signed)
Cut back to zoloft to 1 or 1.5 pills a day and add on wellbutrin  Continue metroprolol Add on benicar, start 1/2 pill daily in the morning, monitor BP Can increase to a whole if BP is greater than 130/80   Can do citracel or benefiber or add on fiber supplement to prevent flare of diverticulitis If you do have a flare such as left lower quadrant pain, diarrhea avoid fiber, do bowel rest which involves liquid diet at first with broth and jello and then slowly advance your diet to bland foods such as potatoes, noodles, etc and then add back in fiber.    Diverticulitis Diverticulitis is inflammation or infection of small pouches in your colon that form when you have a condition called diverticulosis. The pouches in your colon are called diverticula. Your colon, or large intestine, is where water is absorbed and stool is formed. Complications of diverticulitis can include:  Bleeding.  Severe infection.  Severe pain.  Perforation of your colon.  Obstruction of your colon.  What are the causes? Diverticulitis is caused by bacteria. Diverticulitis happens when stool becomes trapped in diverticula. This allows bacteria to grow in the diverticula, which can lead to inflammation and infection. What increases the risk? People with diverticulosis are at risk for diverticulitis. Eating a diet that does not include enough fiber from fruits and vegetables may make diverticulitis more likely to develop. What are the signs or symptoms? Symptoms of diverticulitis may include:  Abdominal pain and tenderness. The pain is normally located on the left side of the abdomen, but may occur in other areas.  Fever and chills.  Bloating.  Cramping.  Nausea.  Vomiting.  Constipation.  Diarrhea.  Blood in your stool.  How is this diagnosed? Your health care provider will ask you about your medical history and do a physical exam. You may need to have tests done because many medical conditions can  cause the same symptoms as diverticulitis. Tests may include:  Blood tests.  Urine tests.  Imaging tests of the abdomen, including X-rays and CT scans.  When your condition is under control, your health care provider may recommend that you have a colonoscopy. A colonoscopy can show how severe your diverticula are and whether something else is causing your symptoms. How is this treated? Most cases of diverticulitis are mild and can be treated at home. Treatment may include:  Taking over-the-counter pain medicines.  Following a clear liquid diet.  Taking antibiotic medicines by mouth for 7-10 days.  More severe cases may be treated at a hospital. Treatment may include:  Not eating or drinking.  Taking prescription pain medicine.  Receiving antibiotic medicines through an IV tube.  Receiving fluids and nutrition through an IV tube.  Surgery.  Follow these instructions at home:  Follow your health care provider's instructions carefully.  Follow a full liquid diet or other diet as directed by your health care provider. After your symptoms improve, your health care provider may tell you to change your diet. He or she may recommend you eat a high-fiber diet. Fruits and vegetables are good sources of fiber. Fiber makes it easier to pass stool.  Take fiber supplements or probiotics as directed by your health care provider.  Only take medicines as directed by your health care provider.  Keep all your follow-up appointments. Contact a health care provider if:  Your pain does not improve.  You have a hard time eating food.  Your bowel movements do not return to normal.  Get help right away if:  Your pain becomes worse.  Your symptoms do not get better.  Your symptoms suddenly get worse.  You have a fever.  You have repeated vomiting.  You have bloody or black, tarry stools. This information is not intended to replace advice given to you by your health care provider.  Make sure you discuss any questions you have with your health care provider. Document Released: 02/20/2005 Document Revised: 10/19/2015 Document Reviewed: 04/07/2013 Elsevier Interactive Patient Education  2017 [employer].  8 Critical Weight-Loss Tips That Aren't Diet and Exercise  1. STARVE THE DISTRACTIONS  All too often when we eat, we're also multitasking: watching TV, answering emails, scrolling through social media. These habits are detrimental to having a strong, clear, healthy relationship with food, and they can hinder our ability to make dietary changes.  In order to truly focus on what you're eating, how much you're eating, why you're eating those specific foods and, most importantly, how those foods make you feel, you need to starve the distractions. That means when you eat, just eat. Focus on your food, the process it went through to end up on your plate, where it came from and how it nourishes you. With this technique, you're more likely to finish a meal feeling satiated.  2.  CONSIDER WHAT YOU'RE NOT WILLING TO DO  This might sound counterintuitive, but it can help provide a "why" when motivation is waning. Declare, in writing, what you are unwilling to do, for example "I am unwilling to be the old dad who cannot play sports with my children".  So consider what you're not willing to accept, write it down, and keep it at the ready.  3.  STOP LABELING FOOD "GOOD" AND "BAD"  You've probably heard someone say they ate something "bad." Maybe you've even said it yourself.  The trouble with 'bad' foods isn't that they'll send you to the grave after a bite or two. The trouble comes when we eat excessive portions of really calorie-dense foods meal after meal, day after day.  Instead of labeling foods as good or bad, think about which foods you can eat a lot of, and which ones you should just eat a little of. Then, plan ways to eat the foods you really like in portions that fit with  your overall goals. A good example of this would be having a slice of pizza alongside a club salad with chicken breast, avocado and a bit of dressing. This is vastly different than 3 slices of pizza, 4 breadsticks with cheese sauce and half of a liter of regular soda.  4.  BRUSH YOUR TEETH AFTER YOU EAT  Getting your mindset in order is important, but sometimes small habits can make a big difference. After eating, you still have the taste of food in their mouth, which often causes people to eat more even if they are full or engage in a nibble or two of dessert.  Brushing your teeth will remove the taste of food from your mouth, and the clean, minty freshness will serve as a cue that mealtime is over.  5.  FOCUS ON CROWDING NOT CUTTING  The most common first step during 'dieting' is to cut. We cut our portion sizes down, we cut out 'bad' foods, we cut out entire food groups. This act of cutting puts [place] and our minds into scarcity mode.  When something is off-limits, even if you're able to avoid it for a while, you could  end up bingeing on it later because you've gone so long without it. So, instead of cutting, focus on crowding. If you crowd your plate and fill it up with more foods like veggies and protein, it simply allows less room for the other stuff. In other words, shift your focus away from what you can't eat, and celebrate the foods that will help you reach your goals.  6.  TAKE TRACKING A STEP FURTHER  Track what you eat, when you ate it, how much you ate and how that food made you feel. Being completely honest with yourself and writing down every single thing that passes through your lips will help you start to notice that maybe you actually do snack, possibly take in more sugar than you thought, eat when you're bored rather than just hungry or maybe that you have a habit of snacking before bed while watching TV.  The difference from simply tracking your food intake is you're taking into  account how food makes you feel, as well as what you're doing while you're eating. This is about becoming more mindful of what, when and why you eat.  7.  PRIORITIZE GOOD SLEEP  One of the strongest risk factors for being overweight is poor sleep. When you're feeling tired, you're more likely to choose unhealthy comfort foods and to skip your workout. Additionally, sleep deprivation may slow down your metabolism. [employer]! Therefore, sleeping 7-8 hours per night can help with weight loss without having to change your diet or increase your physical activity. And if you feel you snore and still wake up tired, talk with me about sleep apnea.  8.  SET ASIDE TIME TO DISCONNECT  Just get out there. Disconnect from the electronics and connect to the elements. Not only will this help reduce stress (a major factor in weight gain) by giving your mind a break from the constant stimulation we've all become so accustomed to, but it may also reprogram your brain to connect with yourself and what you're feeling.

## 2017-07-11 LAB — BASIC METABOLIC PANEL WITH GFR
BUN: 18 mg/dL (ref 7–25)
CHLORIDE: 101 mmol/L (ref 98–110)
CO2: 29 mmol/L (ref 20–32)
Calcium: 10.3 mg/dL (ref 8.6–10.3)
Creat: 0.99 mg/dL (ref 0.70–1.25)
GFR, EST AFRICAN AMERICAN: 92 mL/min/{1.73_m2} (ref >=60)
GFR, EST NON AFRICAN AMERICAN: 79 mL/min/{1.73_m2} (ref >=60)
Glucose, Bld: 94 mg/dL (ref 65–99)
POTASSIUM: 4.8 mmol/L (ref 3.5–5.3)
Sodium: 139 mmol/L (ref 135–146)

## 2017-07-11 LAB — HEPATIC FUNCTION PANEL
AG Ratio: 1.7 (calc) (ref 1.0–2.5)
ALKALINE PHOSPHATASE (APISO): 62 U/L (ref 40–115)
ALT: 37 U/L (ref 9–46)
AST: 38 U/L — AB (ref 10–35)
Albumin: 4.8 g/dL (ref 3.6–5.1)
BILIRUBIN INDIRECT: 0.4 mg/dL (ref 0.2–1.2)
Bilirubin, Direct: 0.1 mg/dL (ref 0.0–0.2)
Globulin: 2.8 g/dL (calc) (ref 1.9–3.7)
TOTAL PROTEIN: 7.6 g/dL (ref 6.1–8.1)
Total Bilirubin: 0.5 mg/dL (ref 0.2–1.2)

## 2017-07-11 LAB — TSH: TSH: 0.95 m[IU]/L (ref 0.40–4.50)

## 2017-07-11 LAB — CBC WITH DIFFERENTIAL/PLATELET
BASOS ABS: 48 {cells}/uL (ref 0–200)
Basophils Relative: 1.1 %
EOS ABS: 299 {cells}/uL (ref 15–500)
Eosinophils Relative: 6.8 %
HEMATOCRIT: 45.9 % (ref 38.5–50.0)
Hemoglobin: 15.6 g/dL (ref 13.2–17.1)
Lymphs Abs: 1087 cells/uL (ref 850–3900)
MCH: 30.1 pg (ref 27.0–33.0)
MCHC: 34 g/dL (ref 32.0–36.0)
MCV: 88.4 fL (ref 80.0–100.0)
MPV: 10.6 fL (ref 7.5–12.5)
Monocytes Relative: 12.8 %
NEUTROS PCT: 54.6 %
Neutro Abs: 2402 cells/uL (ref 1500–7800)
PLATELETS: 304 10*3/uL (ref 140–400)
RBC: 5.19 10*6/uL (ref 4.20–5.80)
RDW: 13.6 % (ref 11.0–15.0)
TOTAL LYMPHOCYTE: 24.7 %
WBC: 4.4 10*3/uL (ref 3.8–10.8)
WBCMIX: 563 {cells}/uL (ref 200–950)

## 2017-07-11 LAB — LIPID PANEL
CHOLESTEROL: 194 mg/dL (ref <200)
HDL: 40 mg/dL — ABNORMAL LOW (ref >40)
LDL Cholesterol (Calc): 120 mg/dL (calc) — ABNORMAL HIGH
Non-HDL Cholesterol (Calc): 154 mg/dL (calc) — ABNORMAL HIGH (ref <130)
Total CHOL/HDL Ratio: 4.9 (calc) (ref <5.0)
Triglycerides: 226 mg/dL — ABNORMAL HIGH (ref <150)

## 2017-07-11 LAB — MAGNESIUM: Magnesium: 2.2 mg/dL (ref 1.5–2.5)

## 2017-07-11 LAB — HEMOGLOBIN A1C
HEMOGLOBIN A1C: 5.7 %{Hb} — AB (ref <5.7)
MEAN PLASMA GLUCOSE: 117 (calc)
eAG (mmol/L): 6.5 (calc)

## 2017-08-04 ENCOUNTER — Ambulatory Visit: Payer: PPO | Admitting: Internal Medicine

## 2017-08-06 ENCOUNTER — Other Ambulatory Visit: Payer: Self-pay

## 2017-08-06 MED ORDER — BUPROPION HCL ER (XL) 150 MG PO TB24: 150.0000 mg | ORAL_TABLET | ORAL | 3 refills | Status: DC

## 2017-08-28 ENCOUNTER — Encounter: Payer: Self-pay | Admitting: Internal Medicine

## 2017-08-28 ENCOUNTER — Ambulatory Visit: Payer: PPO | Admitting: Internal Medicine

## 2017-08-28 VITALS — BP 132/82 | HR 69 | Ht 66.0 in | Wt 225.6 lb

## 2017-08-28 DIAGNOSIS — G4733 Obstructive sleep apnea (adult) (pediatric): Secondary | ICD-10-CM

## 2017-08-28 NOTE — Patient Instructions (Signed)
Order- [facility]- Please replace old mask of choice, humidifier, supplies. Continue 13 cwp, humidifier, AirView    Dx OSA  Please call if we can help

## 2017-08-28 NOTE — Progress Notes (Signed)
HPI Male former smoker followed for OSA complicated by obesity, HBP NPSG 08/25/95- Severe obstructive sleep apnea, AHI 58/hr. ---------------------------------------------------------------------------  04/15/2016-67 year old male former smoker followed for OSA, complicated by obesity, HBP CPAP 12.5/Advanced FOLLOW FOR; dme [facility] neeeds new mask and head gear. Download confirms excellent compliance and control on CPAP 13. Wife confirms he is not snoring through his machine.  08/28/2017- 67 year old male former smoker followed for OSA, complicated by obesity, HBP CPAP 13/[facility] Download 100% compliance AHI 3.3/hour.  He sleeps very well and is quite pleased to continue CPAP.  Pressure is comfortable.  Machine may be getting old but it works well for him.  We discussed potential replacement.  ROS- see HPI + = positive Constitutional:   No-   weight loss, night sweats, fevers, chills, fatigue, lassitude. HEENT:   No-  headaches, difficulty swallowing, tooth/dental problems, sore throat,       No-  sneezing, itching, ear ache, nasal congestion, post nasal drip,  CV:  No-   chest pain, orthopnea, PND, swelling in lower extremities, anasarca, dizziness, palpitations Resp: No-   shortness of breath with exertion or at rest.              No-   productive cough,  No non-productive cough,  No- coughing up of blood.              No-   change in color of mucus.  No- wheezing.   Skin: No-   rash or lesions. GI:  No-   heartburn, indigestion, abdominal pain, nausea, vomiting,  GU: . MS:  No-   joint pain or swelling.  Neuro-     nothing unusual Psych:  No- change in mood or affect. No depression or anxiety.  No memory loss.  OBJ General- Alert, Oriented, Affect-appropriate, Distress- none acute; +overweight. + Full beard Skin- rash-none, lesions- none, excoriation- none Lymphadenopathy- none Head- atraumatic            Eyes- Gross vision intact, PERRLA, conjunctivae clear secretions  Ears- Hearing, canals-normal            Nose- Clear, no-Septal dev, mucus, polyps, erosion, perforation             Throat- Mallampati III-IV , mucosa , drainage- none, tonsils- atrophic Neck- flexible , trachea midline, no stridor , thyroid nl, carotid no bruit Chest - symmetrical excursion , unlabored           Heart/CV- RRR , no murmur , no gallop  , no rub, nl s1 s2                           - JVD- none , edema- none, stasis changes- none, varices- none           Lung- clear to P&A, wheeze- none, cough- none , dullness-none, rub- none           Chest wall-  Abd-  Br/ Gen/ Rectal- Not done, not indicated Extrem- cyanosis- none, clubbing, none, atrophy- none, strength- nl Neuro- grossly intact to observation

## 2017-08-28 NOTE — Assessment & Plan Note (Signed)
Download results support his statement that he sleeps well and benefits from CPAP, comfortable with current pressures.  We discussed options for replacement of old machine. Plan-continue present settings.  Consider machine replacement next year. Consider replacement machine next year.

## 2017-08-28 NOTE — Assessment & Plan Note (Signed)
Enjoys playing [place] each Christmas

## 2017-09-08 ENCOUNTER — Other Ambulatory Visit: Payer: Self-pay | Admitting: Internal Medicine

## 2017-09-12 DIAGNOSIS — G4733 Obstructive sleep apnea (adult) (pediatric): Secondary | ICD-10-CM | POA: Diagnosis not present

## 2017-09-12 DIAGNOSIS — R269 Unspecified abnormalities of gait and mobility: Secondary | ICD-10-CM | POA: Diagnosis not present

## 2017-09-12 DIAGNOSIS — G4730 Sleep apnea, unspecified: Secondary | ICD-10-CM | POA: Diagnosis not present

## 2017-09-30 ENCOUNTER — Other Ambulatory Visit: Payer: Self-pay | Admitting: Internal Medicine

## 2017-10-01 ENCOUNTER — Other Ambulatory Visit: Payer: Self-pay | Admitting: Physician Assistant

## 2017-10-06 ENCOUNTER — Other Ambulatory Visit: Payer: Self-pay | Admitting: Internal Medicine

## 2017-11-18 ENCOUNTER — Encounter: Payer: Self-pay | Admitting: Internal Medicine

## 2017-11-18 ENCOUNTER — Ambulatory Visit (INDEPENDENT_AMBULATORY_CARE_PROVIDER_SITE_OTHER): Payer: PPO | Admitting: Internal Medicine

## 2017-11-18 VITALS — BP 144/84 | HR 56 | Temp 97.3°F | Resp 18 | Ht 67.0 in | Wt 225.6 lb

## 2017-11-18 DIAGNOSIS — M25511 Pain in right shoulder: Secondary | ICD-10-CM

## 2017-11-18 MED ORDER — PREDNISONE 20 MG PO TABS: ORAL_TABLET | ORAL | 0 refills | Status: DC

## 2017-11-18 NOTE — Progress Notes (Signed)
Subjective:    Patient ID: [name] [name], male    DOB: 12/12/1950, 67 y.o.   MRN: [id number]  HPI     This very nice 67 yo MWM presents with a 10 day hx/o Rt shoulder pain radiating down the anterior biceps area to the Rt forearm. Occurred initially while reaching with arm outstretched and struck his dorsal Rt forearm.  Medication Sig  . Ascorbic Acid (VITAMIN C) 1000 MG tablet Take 1,000 mg by mouth daily.   Marland Kitchen aspirin 325 MG tablet Take 325 mg by mouth daily.    . B Complex Vitamins (B COMPLEX PO) Take 1 tablet by mouth See admin instructions. Takes M,W,F  . buPROPion (WELLBUTRIN XL) 150 MG 24 hr tablet Take 1 tablet (150 mg total) by mouth every morning.  Marland Kitchen EPINEPHrine (EPIPEN 2-PAK) 0.3 mg/0.3 mL IJ SOAJ injection Inject 0.3 mLs (0.3 mg total) into the muscle once.  . ezetimibe (ZETIA) 10 MG tablet TAKE 1 TABLET (10 MG TOTAL) BY MOUTH DAILY.  . fish oil-omega-3 fatty acids 1000 MG capsule Take 2 g by mouth 3 (three) times daily.   . Flaxseed, Linseed, (FLAX SEED OIL) 1000 MG CAPS Take 2,000 mg by mouth daily.  Marland Kitchen gemfibrozil (LOPID) 600 MG tablet TAKE 1 TABLET TWICE A DAY WITH MEALS FOR CHOLESTEROL  . levothyroxine (SYNTHROID, LEVOTHROID) 50 MCG tablet Take 1 tablet (50 mcg total) by mouth daily.  Marland Kitchen MAGNESIUM PO Take 750 mg by mouth daily.   . metoprolol tartrate (LOPRESSOR) 25 MG tablet TAKE 1 TABLET (25 MG TOTAL) 2 (TWO) TIMES DAILY BY MOUTH.  . Multiple Vitamin (MULTIVITAMIN) capsule Take by mouth.  . olmesartan (BENICAR) 20 MG tablet TAKE 1 TABLET BY MOUTH EVERY DAY  . sertraline (ZOLOFT) 100 MG tablet TAKE ONE AND ONE-HALF TO TWO TABLETS DAILY AS DIRECTED FOR MOOD   No facility-administered medications prior to visit.    Allergies  Allergen Reactions  . Ace Inhibitors Other (See Comments)    cough  . Bee Venom Other (See Comments)   Past Medical History:  Diagnosis Date  . Cardiomegaly   . Colon polyp   . Depression   . Fatty liver disease, nonalcoholic   . Gallstones   .  Hypertension   . Hypogonadism male   . Mixed hyperlipidemia   . Obesity   . Pancreas (digestive gland) works poorly   . Prediabetes   . Sleep apnea   . Thyroid disease   . Vitamin D deficiency    Review of Systems    10 point systems review negative except as above.    Objective:   Physical Exam   BP (!) 144/84   Pulse (!) 56   Temp (!) 97.3 F (36.3 C)   Resp 18   Ht 5\' 7"  (1.702 m)   Wt 225 lb 9.6 oz (102.3 kg)   BMI 35.33 kg/m   HEENT - WNL. Neck - supple.  Chest - Clear equal BS. Cor - Nl HS. RRR w/o sig m. No edema. MS- FROM w/o deformities.  Rt  Shoulder painful with abduction to the horizontal, but not with internal/external rotation.  No tenderness around the shoulder. Gait Nl. Neuro -  Nl w/o focal abnormalities.    Assessment & Plan:   1. Acute pain of right shoulder, suspect strain   - predniSONE (DELTASONE) 20 MG tablet; 1 tab 3 x day for5 days, then 1 tab 2 x day for 5 days, then 1 tab 1 x day for 5  days  Dispense: 30 tablet  - discussed meds/SE's & ROV if not recover

## 2017-11-18 NOTE — Patient Instructions (Signed)
Shoulder Pain Many things can cause shoulder pain, including:  An injury to the area.  Overuse of the shoulder.  Arthritis.  The source of the pain can be:  Inflammation.  An injury to the shoulder joint.  An injury to a tendon, ligament, or bone.  Follow these instructions at home: Take these actions to help with your pain:  Squeeze a soft ball or a foam pad as much as possible. This helps to keep the shoulder from swelling. It also helps to strengthen the arm.  Take over-the-counter and prescription medicines only as told by your health care provider.  If directed, apply ice to the area: ? Put ice in a plastic bag. ? Place a towel between your skin and the bag. ? Leave the ice on for 20 minutes, 2-3 times per day. Stop applying ice if it does not help with the pain.  If you were given a shoulder sling or immobilizer: ? Wear it as told. ? Remove it to shower or bathe. ? Move your arm as little as possible, but keep your hand moving to prevent swelling.  Contact a health care provider if:  Your pain gets worse.  Your pain is not relieved with medicines.  New pain develops in your arm, hand, or fingers. Get help right away if:  Your arm, hand, or fingers: ? Tingle. ? Become numb. ? Become swollen. ? Become painful. ? Turn white or blue. This information is not intended to replace advice given to you by your health care provider. Make sure you discuss any questions you have with your health care provider. Document Released: 02/20/2005 Document Revised: 01/07/2016 Document Reviewed: 09/05/2014 Elsevier Interactive Patient Education  2018 [employer]  

## 2018-01-05 ENCOUNTER — Encounter: Payer: Self-pay | Admitting: Physician Assistant

## 2018-01-17 ENCOUNTER — Other Ambulatory Visit: Payer: Self-pay | Admitting: Internal Medicine

## 2018-01-18 NOTE — Progress Notes (Addendum)
CPE  Assessment and Plan: Essential hypertension - DASH diet, exercise and monitor at home. Call if greater than 130/80.  - CBC with Differential/Platelet - BASIC METABOLIC PANEL WITH GFR - Hepatic function panel - Urinalysis, Routine w reflex microscopic - Microalbumin / creatinine urine ratio - EKG 12-Lead   Hypothyroidism, unspecified hypothyroidism type Hypothyroidism-check TSH level, continue medications the same, reminded to take on an empty stomach 30-56mins before food.  - TSH  Prediabetes Discussed general issues about diabetes pathophysiology and management., Educational material distributed., Suggested low cholesterol diet., Encouraged aerobic exercise., Discussed foot care., Reminded to get yearly retinal exam. - Hemoglobin A1c   Hyperlipidemia -continue medications, check lipids, decrease fatty foods, increase activity.  - Lipid panel  Morbid Obesity - follow up 3 months for progress monitoring - increase veggies, decrease carbs - long discussion about weight loss, diet, and exercise   Vitamin D deficiency - Vit D  25 hydroxy (rtn osteoporosis monitoring)  Testosterone deficiency - Testosterone  Medication management - Magnesium  Left foot drop Monitor, follow up ortho  myositis MONITOR  Obstructive sleep apnea Continue CPAP  Erectile dysfunction, unspecified erectile dysfunction type Continue testosterone and cialis PRN   Major depression in remission Continue zoloft Continue wellbutrin for winter  Discussed med's effects and SE's. Screening labs and tests as requested with regular follow-up as recommended. Over 40 minutes of exam, counseling, chart review and critical decision making was performed Future Appointments  Date Time Provider [place]  07/14/2018  9:30 AM Liane Comber, NP [facility] None  09/01/2018  9:00 AM Baird Lyons D, MD [facility] None  01/25/2019  9:00 AM Vicie Mutters, PA-C [facility] None      HPI Patient presents for a CPE  His blood pressure has been controlled at home, today their BP is BP: 126/78  He has a history of LVH with normal EF, last echo 2011, and normal cardiolite 2014. He is on ASA, BB, and fluid pill.  He is retired, he had tib/fib fracture s/p surgery 03/21/16. He has joined the [employer].  He follows with [facility].  He does not workout.  He denies chest pain, shortness of breath, dizziness.  He is on cholesterol medication and denies myalgias, had intolerant to statins due to elevated CPK so on zetia.  His cholesterol is at goal. The cholesterol last visit was:   Lab Results  Component Value Date   CHOL 194 07/10/2017   HDL 40 (L) 07/10/2017   LDLCALC 120 (H) 07/10/2017   TRIG 226 (H) 07/10/2017   CHOLHDL 4.9 07/10/2017  He has been working on diet and exercise for prediabetes,  and denies paresthesia of the feet, polydipsia, polyuria and visual disturbances. Last A1C in the office was:  Lab Results  Component Value Date   HGBA1C 5.7 (H) 07/10/2017  Patient is on Vitamin D supplement.   Lab Results  Component Value Date   VD25OH 68 04/09/2017  He is on zoloft for depression, 1.5 daily.   He is on thyroid medication. His medication was not changed last visit.   Lab Results  Component Value Date   TSH 0.95 07/10/2017   Last PSA was, follows with Dr. Suann Larry PRN, has had elevated PSA while on testosterone.  Lab Results  Component Value Date   PSA 0.6 01/01/2017  BMI is Body mass index is 33.77 kg/m., he is working on diet and exercise, he has sleep apnea with CPAP and hypogonadism consequence of his obesity. He is working at the [employer]  st[employer]. Wt Readings from Last 3 Encounters:  01/20/18 225 lb 6.4 oz (102.2 kg)  11/18/17 225 lb 9.6 oz (102.3 kg)  08/28/17 225 lb 9.6 oz (102.3 kg)    Current Medications:  Current Outpatient Medications on File Prior to Visit  Medication Sig Dispense Refill  . Ascorbic Acid (VITAMIN C) 1000 MG  tablet Take 1,000 mg by mouth daily.     Marland Kitchen aspirin 325 MG tablet Take 325 mg by mouth daily.      . B Complex Vitamins (B COMPLEX PO) Take 1 tablet by mouth See admin instructions. Takes M,W,F    . buPROPion (WELLBUTRIN XL) 150 MG 24 hr tablet Take 1 tablet (150 mg total) by mouth every morning. 90 tablet 3  . EPINEPHrine (EPIPEN 2-PAK) 0.3 mg/0.3 mL IJ SOAJ injection Inject 0.3 mLs (0.3 mg total) into the muscle once. 2 Device 0  . ezetimibe (ZETIA) 10 MG tablet TAKE 1 TABLET (10 MG TOTAL) BY MOUTH DAILY. 90 tablet 3  . fish oil-omega-3 fatty acids 1000 MG capsule Take 2 g by mouth 3 (three) times daily.     . Flaxseed, Linseed, (FLAX SEED OIL) 1000 MG CAPS Take 2,000 mg by mouth daily.    Marland Kitchen gemfibrozil (LOPID) 600 MG tablet TAKE 1 TABLET TWICE A DAY WITH MEALS FOR CHOLESTEROL 180 tablet 1  . levothyroxine (SYNTHROID, LEVOTHROID) 50 MCG tablet TAKE 1 TABLET BY MOUTH EVERY DAY 90 tablet 3  . MAGNESIUM PO Take 750 mg by mouth daily.     . metoprolol tartrate (LOPRESSOR) 25 MG tablet TAKE 1 TABLET (25 MG TOTAL) 2 (TWO) TIMES DAILY BY MOUTH. 180 tablet 1  . Multiple Vitamin (MULTIVITAMIN) capsule Take by mouth.    . olmesartan (BENICAR) 20 MG tablet TAKE 1 TABLET BY MOUTH EVERY DAY 90 tablet 0  . sertraline (ZOLOFT) 100 MG tablet TAKE ONE AND ONE-HALF TO TWO TABLETS DAILY AS DIRECTED FOR MOOD 180 tablet 0   No current facility-administered medications on file prior to visit.    Health Maintenance:  Immunization History  Administered Date(s) Administered  . Influenza Split 03/06/2012, 02/24/2013, 03/18/2014  . Influenza, High Dose Seasonal PF 02/12/2016, 04/09/2017  . Influenza-Unspecified 03/03/2015  . PPD Test 07/26/2013  . Pneumococcal Conjugate-13 02/12/2016  . Pneumococcal Polysaccharide-23 07/26/2013  . Pneumococcal-Unspecified 05/04/2004  . Tdap 07/24/2011  . Zoster 12/02/2013   Influenza 2018 TDAP 2013 Pneumovax 2015 Prevnar: 2017 Shingles vaccine: 2015  Colonoscopy  03/14/2017 EYE Exam WNL 2018 ECHO 08/06/09 Neg cardiolite 2014 CXR 06/30/12 WNL U/S ABD 08/01/11- FATTY LIVER  Patient Care Team: Unk Pinto, MD as PCP - General (Internal Medicine) Rana Snare, MD as Consulting Physician (Urology) Irene Shipper, MD as Consulting Physician (Gastroenterology) Melvyn Novas, MD as Referring Physician (Neurology) Lavonna Monarch, MD as Consulting Physician (Dermatology) Dyke Maes, OD (Optometry) Josue Hector, MD as Consulting Physician (Cardiology)  Medical History:  Past Medical History:  Diagnosis Date  . Cardiomegaly   . Colon polyp   . Depression   . Fatty liver disease, nonalcoholic   . Gallstones   . Hypertension   . Hypogonadism male   . Mixed hyperlipidemia   . Obesity   . Pancreas (digestive gland) works poorly   . Prediabetes   . Sleep apnea   . Thyroid disease   . Vitamin D deficiency    Allergies Allergies  Allergen Reactions  . Ace Inhibitors Other (See Comments)    cough  . Bee Venom Other (See Comments)  SURGICAL HISTORY He  has a past surgical history that includes Cholecystectomy (1976); Appendectomy (1964); Ankle fracture surgery (Left, 1986); Carpal tunnel release (Bilateral, 1996); Tibia IM nail insertion (Right, 03/21/2016); Colonoscopy; and Polypectomy. FAMILY HISTORY His family history includes Diabetes in his father, mother, and sister; Heart disease in his father; Hypertension in his father. SOCIAL HISTORY He  reports that he quit smoking about 43 years ago. His smoking use included cigarettes. He has a 14.00 pack-year smoking history. He has never used smokeless tobacco. He reports that he does not drink alcohol or use drugs.   Review of Systems:  Review of Systems  Constitutional: Negative.   HENT: Negative.   Eyes: Negative.   Respiratory: Negative.   Cardiovascular: Negative.   Gastrointestinal: Negative.   Genitourinary: Negative.   Musculoskeletal: Positive for myalgias.   Skin: Negative.   Neurological: Negative.   Endo/Heme/Allergies: Negative.   Psychiatric/Behavioral: Negative.     Physical Exam: Estimated body mass index is 33.77 kg/m as calculated from the following:   Height as of this encounter: 5' 8.5" (1.74 m).   Weight as of this encounter: 225 lb 6.4 oz (102.2 kg). BP 126/78   Pulse 62   Temp (!) 97.3 F (36.3 C)   Resp 14   Ht 5' 8.5" (1.74 m)   Wt 225 lb 6.4 oz (102.2 kg)   SpO2 96%   BMI 33.77 kg/m  General Appearance: Well nourished, in no apparent distress.  Eyes: PERRLA, EOMs, conjunctiva no swelling or erythema, normal fundi and vessels.  Sinuses: No Frontal/maxillary tenderness  ENT/Mouth: Ext aud canals clear except right ear with cerumen impaction, normal light reflex with TMs without erythema, bulging. Good dentition. No erythema, swelling, or exudate on post pharynx. Tonsils not swollen or erythematous. Hearing normal.  Neck: Supple, thyroid normal. No bruits  Respiratory: Respiratory effort normal, BS equal bilaterally without rales, rhonchi, wheezing or stridor.  Cardio: distant heart sounds, RRR without murmurs, rubs or gallops. Brisk peripheral pulses without edema.  Chest: symmetric, with normal excursions and percussion.  Abdomen: Soft, obese, nontender, with large vertical and right horizontal scar no guarding, rebound, hernias, masses, or organomegaly.  Lymphatics: Non tender without lymphadenopathy.  Genitourinary: defer Musculoskeletal: Full ROM all peripheral extremities,5/5 strength, and normal gait, right tibia with healing fracture.   Skin:   Warm, dry without rashes, lesions, ecchymosis. Neuro: Cranial nerves intact, reflexes decreased bilateral legs, worse left than right with decrease dorsiflexion/plantar on left. Normal muscle tone, no cerebellar symptoms. Sensation intact.  Psych: Awake and oriented X 3, normal affect, Insight and Judgment appropriate.   EKG: sinus bradycardia, rate is 50, no ST changes.     Vicie Mutters 9:07 AM [facility]

## 2018-01-20 ENCOUNTER — Encounter: Payer: Self-pay | Admitting: Physician Assistant

## 2018-01-20 ENCOUNTER — Ambulatory Visit (INDEPENDENT_AMBULATORY_CARE_PROVIDER_SITE_OTHER): Payer: PPO | Admitting: Physician Assistant

## 2018-01-20 VITALS — BP 126/78 | HR 62 | Temp 97.3°F | Resp 14 | Ht 68.5 in | Wt 225.4 lb

## 2018-01-20 DIAGNOSIS — Z136 Encounter for screening for cardiovascular disorders: Secondary | ICD-10-CM

## 2018-01-20 DIAGNOSIS — E782 Mixed hyperlipidemia: Secondary | ICD-10-CM | POA: Diagnosis not present

## 2018-01-20 DIAGNOSIS — Z6833 Body mass index (BMI) 33.0-33.9, adult: Secondary | ICD-10-CM

## 2018-01-20 DIAGNOSIS — E039 Hypothyroidism, unspecified: Secondary | ICD-10-CM | POA: Diagnosis not present

## 2018-01-20 DIAGNOSIS — N401 Enlarged prostate with lower urinary tract symptoms: Secondary | ICD-10-CM

## 2018-01-20 DIAGNOSIS — F325 Major depressive disorder, single episode, in full remission: Secondary | ICD-10-CM

## 2018-01-20 DIAGNOSIS — R7309 Other abnormal glucose: Secondary | ICD-10-CM

## 2018-01-20 DIAGNOSIS — I10 Essential (primary) hypertension: Secondary | ICD-10-CM | POA: Diagnosis not present

## 2018-01-20 DIAGNOSIS — Z79899 Other long term (current) drug therapy: Secondary | ICD-10-CM

## 2018-01-20 DIAGNOSIS — Z125 Encounter for screening for malignant neoplasm of prostate: Secondary | ICD-10-CM

## 2018-01-20 DIAGNOSIS — E559 Vitamin D deficiency, unspecified: Secondary | ICD-10-CM

## 2018-01-20 DIAGNOSIS — G4733 Obstructive sleep apnea (adult) (pediatric): Secondary | ICD-10-CM

## 2018-01-20 DIAGNOSIS — Z0000 Encounter for general adult medical examination without abnormal findings: Secondary | ICD-10-CM | POA: Diagnosis not present

## 2018-01-20 DIAGNOSIS — N138 Other obstructive and reflux uropathy: Secondary | ICD-10-CM

## 2018-01-20 NOTE — Progress Notes (Signed)
Message left for patient to return my call.I will continue to try later.  

## 2018-01-20 NOTE — Patient Instructions (Addendum)
Tailbone Injury The tailbone is the small bone at the lower end of the backbone (spine). You may have stretched tissues, bruises, or a broken bone (fracture). These injuries can be painful. Most tailbone injuries get better on their own in 4-6 weeks. Follow these instructions at home:  Take medicines only as told by your doctor.  If told, apply ice to the injured area. ? Put ice in a plastic bag. ? Place a towel between your skin and the bag. ? Leave the ice on for 20 minutes, 2-3 times per day. Do this for the first 1-2 days.  Sit on a large, rubber or inflated ring or cushion to lessen pain. Lean forward when you sit to help lessen pain.  Avoid sitting in one place for a long time.  Increase your activity as the pain allows.  Do exercises as told by your doctor or physical therapist.  If it is painful to poop, take medicine to help you poop (stool softeners) as told by your doctor.  Eat foods that have plenty of fiber.  Keep all follow-up visits as told by your doctor. This is important. Contact a doctor if:  Your pain gets worse.  Pooping causes you pain.  You cannot poop (constipation).  You are leaking pee (urinary incontinence).  You have a fever. This information is not intended to replace advice given to you by your health care provider. Make sure you discuss any questions you have with your health care provider. Document Released: 06/15/2010 Document Revised: 01/11/2016 Document Reviewed: 05/09/2014 Elsevier Interactive Patient Education  2018 [employer].   Fatty liver or Nonalcoholic fatty liver disease (NASH) is now the leading cause of liver failure in the [place]. It is normally from such risk factors as obesity, diabetes, insulin resistance, high cholesterol, or metabolic syndrome. The only definitive therapy is weight loss and exercise.  Suggest walking 20-30 mins daily.  Decreasing carbohydrates, increasing veggies.  Vitamin E 800 IU a day may be  beneficial. Liver cancer has been noted in patient with fatty liver without cirrhosis.  Will monitor closely   Fatty Liver Fatty liver is the accumulation of fat in liver cells. It is also called hepatosteatosis or steatohepatitis. It is normal for your liver to contain some fat. If fat is more than 5 to 10% of your liver's weight, you have fatty liver.  There are often no symptoms (problems) for years while damage is still occurring. People often learn about their fatty liver when they have medical tests for other reasons. Fat can damage your liver for years or even decades without causing problems. When it becomes severe, it can cause fatigue, weight loss, weakness, and confusion. This makes you more likely to develop more serious liver problems. The liver is the largest organ in the body. It does a lot of work and often gives no warning signs when it is sick until late in a disease. The liver has many important jobs including:  Breaking down foods.  Storing vitamins, iron, and other minerals.  Making proteins.  Making bile for food digestion.  Breaking down many products including medications, alcohol and some poisons.  PROGNOSIS  Fatty liver may cause no damage or it can lead to an inflammation of the liver. This is, called steatohepatitis.  Over time the liver may become scarred and hardened. This condition is called cirrhosis. Cirrhosis is serious and may lead to liver failure or cancer. NASH is one of the leading causes of cirrhosis. About 10-20% of  Americans have fatty liver and a smaller 2-5% has NASH.  TREATMENT   Weight loss, fat restriction, and exercise in overweight patients produces inconsistent results but is worth trying.  Good control of diabetes may reduce fatty liver.  Eat a balanced, healthy diet.  Increase your physical activity.  There are no medical or surgical treatments for a fatty liver or NASH, but improving your diet and increasing your exercise may help  prevent or reverse some of the damage.   Check out  Mini habits for weight loss book  2 apps for tracking food is myfitness pal  loseit OR can take picture of your food

## 2018-01-20 NOTE — Progress Notes (Signed)
PT HAS BEEN MADE AWARE OF EKG & MONITOR HR & BP.

## 2018-01-21 LAB — MICROALBUMIN / CREATININE URINE RATIO
Creatinine, Urine: 89 mg/dL (ref 20–320)
MICROALB/CREAT RATIO: 18 ug/mg{creat} (ref <30)
Microalb, Ur: 1.6 mg/dL

## 2018-01-21 LAB — CBC WITH DIFFERENTIAL/PLATELET
Basophils Absolute: 52 cells/uL (ref 0–200)
Basophils Relative: 1.2 %
Eosinophils Absolute: 211 cells/uL (ref 15–500)
Eosinophils Relative: 4.9 %
HCT: 43.3 % (ref 38.5–50.0)
HEMOGLOBIN: 15 g/dL (ref 13.2–17.1)
Lymphs Abs: 1174 cells/uL (ref 850–3900)
MCH: 30.5 pg (ref 27.0–33.0)
MCHC: 34.6 g/dL (ref 32.0–36.0)
MCV: 88.2 fL (ref 80.0–100.0)
MPV: 10.4 fL (ref 7.5–12.5)
Monocytes Relative: 15.3 %
Neutro Abs: 2206 cells/uL (ref 1500–7800)
Neutrophils Relative %: 51.3 %
Platelets: 265 10*3/uL (ref 140–400)
RBC: 4.91 10*6/uL (ref 4.20–5.80)
RDW: 12.9 % (ref 11.0–15.0)
Total Lymphocyte: 27.3 %
WBC: 4.3 10*3/uL (ref 3.8–10.8)
WBCMIX: 658 {cells}/uL (ref 200–950)

## 2018-01-21 LAB — COMPLETE METABOLIC PANEL WITH GFR
AG RATIO: 1.9 (calc) (ref 1.0–2.5)
ALT: 41 U/L (ref 9–46)
AST: 38 U/L — ABNORMAL HIGH (ref 10–35)
Albumin: 5 g/dL (ref 3.6–5.1)
Alkaline phosphatase (APISO): 61 U/L (ref 40–115)
BUN: 21 mg/dL (ref 7–25)
CALCIUM: 10.3 mg/dL (ref 8.6–10.3)
CO2: 28 mmol/L (ref 20–32)
Chloride: 102 mmol/L (ref 98–110)
Creat: 0.98 mg/dL (ref 0.70–1.25)
GFR, EST AFRICAN AMERICAN: 93 mL/min/{1.73_m2} (ref >=60)
GFR, EST NON AFRICAN AMERICAN: 80 mL/min/{1.73_m2} (ref >=60)
GLOBULIN: 2.7 g/dL (ref 1.9–3.7)
Glucose, Bld: 95 mg/dL (ref 65–99)
POTASSIUM: 4.4 mmol/L (ref 3.5–5.3)
SODIUM: 140 mmol/L (ref 135–146)
Total Bilirubin: 0.6 mg/dL (ref 0.2–1.2)
Total Protein: 7.7 g/dL (ref 6.1–8.1)

## 2018-01-21 LAB — URINALYSIS, ROUTINE W REFLEX MICROSCOPIC
Bilirubin Urine: NEGATIVE
Glucose, UA: NEGATIVE
HGB URINE DIPSTICK: NEGATIVE
KETONES UR: NEGATIVE
Leukocytes, UA: NEGATIVE
NITRITE: NEGATIVE
PH: 6 (ref 5.0–8.0)
Protein, ur: NEGATIVE
Specific Gravity, Urine: 1.017 (ref 1.001–1.03)

## 2018-01-21 LAB — MAGNESIUM: MAGNESIUM: 2.2 mg/dL (ref 1.5–2.5)

## 2018-01-21 LAB — LIPID PANEL
Cholesterol: 188 mg/dL (ref <200)
HDL: 40 mg/dL — ABNORMAL LOW (ref >40)
LDL Cholesterol (Calc): 117 mg/dL (calc) — ABNORMAL HIGH
NON-HDL CHOLESTEROL (CALC): 148 mg/dL — AB (ref <130)
TRIGLYCERIDES: 193 mg/dL — AB (ref <150)
Total CHOL/HDL Ratio: 4.7 (calc) (ref <5.0)

## 2018-01-21 LAB — TSH: TSH: 0.93 mIU/L (ref 0.40–4.50)

## 2018-01-21 LAB — VITAMIN D 25 HYDROXY (VIT D DEFICIENCY, FRACTURES): Vit D, 25-Hydroxy: 74 ng/mL (ref 30–100)

## 2018-01-21 LAB — HEMOGLOBIN A1C
Hgb A1c MFr Bld: 5.8 % of total Hgb — ABNORMAL HIGH (ref <5.7)
MEAN PLASMA GLUCOSE: 120 (calc)
eAG (mmol/L): 6.6 (calc)

## 2018-01-21 LAB — PSA: PSA: 0.7 ng/mL (ref <=4.0)

## 2018-03-11 ENCOUNTER — Other Ambulatory Visit: Payer: Self-pay | Admitting: Physician Assistant

## 2018-03-17 DIAGNOSIS — D1801 Hemangioma of skin and subcutaneous tissue: Secondary | ICD-10-CM | POA: Diagnosis not present

## 2018-03-17 DIAGNOSIS — L814 Other melanin hyperpigmentation: Secondary | ICD-10-CM | POA: Diagnosis not present

## 2018-03-17 DIAGNOSIS — L570 Actinic keratosis: Secondary | ICD-10-CM | POA: Diagnosis not present

## 2018-04-05 ENCOUNTER — Other Ambulatory Visit: Payer: Self-pay | Admitting: Internal Medicine

## 2018-04-11 ENCOUNTER — Other Ambulatory Visit: Payer: Self-pay | Admitting: Physician Assistant

## 2018-04-27 ENCOUNTER — Other Ambulatory Visit: Payer: Self-pay | Admitting: Physician Assistant

## 2018-05-01 ENCOUNTER — Other Ambulatory Visit: Payer: Self-pay | Admitting: Internal Medicine

## 2018-05-04 ENCOUNTER — Other Ambulatory Visit: Payer: Self-pay | Admitting: *Deleted

## 2018-05-04 MED ORDER — EZETIMIBE 10 MG PO TABS: 10.0000 mg | ORAL_TABLET | Freq: Every day | ORAL | 3 refills | Status: DC

## 2018-06-22 ENCOUNTER — Other Ambulatory Visit: Payer: Self-pay

## 2018-06-22 MED ORDER — OLMESARTAN MEDOXOMIL 20 MG PO TABS: 20.0000 mg | ORAL_TABLET | Freq: Every day | ORAL | 0 refills | Status: DC

## 2018-07-13 ENCOUNTER — Encounter: Payer: Self-pay | Admitting: Adult Health

## 2018-07-13 DIAGNOSIS — R7309 Other abnormal glucose: Secondary | ICD-10-CM | POA: Insufficient documentation

## 2018-07-13 DIAGNOSIS — R7303 Prediabetes: Secondary | ICD-10-CM

## 2018-07-13 NOTE — Progress Notes (Signed)
MEDICARE WELLNESS VISIT  Assessment and Plan:  Essential hypertension Continue medication; continue BB, increase olmesartan to 20 mg if consistently above goal  Monitor blood pressure at home; call if consistently over 130/80 Continue DASH diet.   Reminder to go to the ER if any CP, SOB, nausea, dizziness, severe HA, changes vision/speech, left arm numbness and tingling and jaw pain. - CBC with Differential/Platelet - CMP/GFR   Hypothyroidism, unspecified hypothyroidism type Hypothyroidism-check TSH level, continue medications the same, reminded to take on an empty stomach 30-39mins before food.  - TSH  Prediabetes Discussed general issues about diabetes pathophysiology and management., Educational material distributed., Suggested low cholesterol diet., Encouraged aerobic exercise., Discussed foot care., Reminded to get yearly retinal exam. - Hemoglobin A1c   Hyperlipidemia -continue medications, check lipids, decrease fatty foods, increase activity.  - Lipid panel  Morbid Obesity - follow up 3 months for progress monitoring - increase veggies, decrease carbs - long discussion about weight loss, diet, and exercise - weight goal of <200 lb set  Prediabetes Discussed disease and risks Discussed diet/exercise, weight management  A1C   Vitamin D deficiency At goal at recent check; continue to recommend supplementation for goal of 70-100 Defer vitamin D level  Medication management - Magnesium  Left foot drop Monitor, improved with increased exercise follow up ortho PRN  myositis MONITOR  Obstructive sleep apnea Continue CPAP  Erectile dysfunction Previously on testosterone and cialis    Major depression in remission Continue zoloft, wellbutrin Lifestyle discussed: diet/exerise, sleep hygiene, stress management, hydration  Bilateral cerumen impaction - stop using Qtips, irrigation used in the office without complications, use OTC drops/oil at home to prevent  reoccurence   Discussed med's effects and SE's. Screening labs and tests as requested with regular follow-up as recommended. Over 40 minutes of exam, counseling, chart review and critical decision making was performed Future Appointments  Date Time Provider [place]  09/01/2018  9:00 AM Deneise Lever, MD [facility] None  01/25/2019  9:00 AM Vicie Mutters, PA-C [facility] None    Plan:   During the course of the visit the patient was educated and counseled about appropriate screening and preventive services including:    Pneumococcal vaccine   Prevnar 13  Influenza vaccine  Td vaccine  Screening electrocardiogram  Bone densitometry screening  Colorectal cancer screening  Diabetes screening  Glaucoma screening  Nutrition counseling   Advanced directives: requested  _____________________________________________________________________________ HPI Patient presents for a medicare wellness visit and for 3 month follow up on htn, hyperlipidemia, prediabetes, hypothyroidism, obesity, vit D def. He has sleep apnea with CPAP and hypogonadism consequence of his obesity. He is working at the [employer].   He has hx of miositis and L foot drop and follows with ortho PRN, declined surgery, [facility]   He has dx of depression in remission on zoloft 150 mg daily, and more recently added wellbutrin 150 mg, due to some depressed mood related to son moving.  BMI is Body mass index is 34.61 kg/m., he has been working on diet and exercise, [facility], spends 1 hour there walking and doing weights. He admits he doesn't pay much attention to his diet.   Wt Readings from Last 3 Encounters:  07/14/18 231 lb (104.8 kg)  01/20/18 225 lb 6.4 oz (102.2 kg)  11/18/17 225 lb 9.6 oz (102.3 kg)   He has not been checking BP at home, he is on metoprolol 25mg  BID, olmesartan  has come down down some but at goal, today their BP is BP: (!)  146/92  He has a history of LVH with normal EF, last echo 2011, and normal cardiolite 2014.  He does workout.  He denies chest pain, shortness of breath, dizziness.  He is on cholesterol medication and denies myalgias, had intolerant to statins due to elevated CPK so on zetia and lopid.  His cholesterol is not at goal. The cholesterol last visit was:   Lab Results  Component Value Date   CHOL 188 01/20/2018   HDL 40 (L) 01/20/2018   LDLCALC 117 (H) 01/20/2018   TRIG 193 (H) 01/20/2018   CHOLHDL 4.7 01/20/2018   He has been working on diet and exercise for prediabetes,  and denies paresthesia of the feet, polydipsia, polyuria and visual disturbances. Last A1C in the office was:  Lab Results  Component Value Date   HGBA1C 5.8 (H) 01/20/2018   Patient is on Vitamin D supplement.   Lab Results  Component Value Date   VD25OH 62 01/20/2018    He is on thyroid medication. His medication was not changed last visit.   Lab Results  Component Value Date   TSH 0.93 01/20/2018    Last PSA was, follows with Dr. Suann Larry PRN, has had elevated PSA while on testosterone.  Lab Results  Component Value Date   PSA 0.7 01/20/2018    Current Medications:  Current Outpatient Medications on File Prior to Visit  Medication Sig Dispense Refill  . Ascorbic Acid (VITAMIN C) 1000 MG tablet Take 1,000 mg by mouth daily.     Marland Kitchen aspirin 325 MG tablet Take 325 mg by mouth daily.      . B Complex Vitamins (B COMPLEX PO) Take 1 tablet by mouth See admin instructions. Takes M,W,F    . buPROPion (WELLBUTRIN XL) 150 MG 24 hr tablet Take 1 tablet (150 mg total) by mouth every morning. 90 tablet 3  . EPINEPHrine (EPIPEN 2-PAK) 0.3 mg/0.3 mL IJ SOAJ injection Inject 0.3 mLs (0.3 mg total) into the muscle once. 2 Device 0  . ezetimibe (ZETIA) 10 MG tablet Take 1 tablet (10 mg total) by mouth daily. 90 tablet 3  . fish oil-omega-3 fatty acids 1000 MG capsule Take 2 g by mouth 3 (three) times daily.     . Flaxseed,  Linseed, (FLAX SEED OIL) 1000 MG CAPS Take 2,000 mg by mouth daily.    Marland Kitchen gemfibrozil (LOPID) 600 MG tablet TAKE 1 TABLET TWICE A DAY WITH MEALS FOR CHOLESTEROL 180 tablet 1  . levothyroxine (SYNTHROID, LEVOTHROID) 50 MCG tablet TAKE 1 TABLET BY MOUTH EVERY DAY 90 tablet 3  . MAGNESIUM PO Take 750 mg by mouth daily.     . metoprolol tartrate (LOPRESSOR) 25 MG tablet TAKE 1 TABLET (25 MG TOTAL) 2 (TWO) TIMES DAILY BY MOUTH. 180 tablet 1  . Multiple Vitamin (MULTIVITAMIN) capsule Take by mouth.    . olmesartan (BENICAR) 20 MG tablet Take 1 tablet (20 mg total) by mouth daily. (Patient taking differently: Take 10 mg by mouth daily. ) 90 tablet 0  . sertraline (ZOLOFT) 100 MG tablet TAKE ONE AND ONE-HALF TO TWO TABLETS DAILY AS DIRECTED FOR MOOD 180 tablet 0   No current facility-administered medications on file prior to visit.    Health Maintenance:  Immunization History  Administered Date(s) Administered  . Influenza Split 03/06/2012, 02/24/2013, 03/18/2014  . Influenza, High Dose Seasonal PF 02/12/2016, 04/09/2017  . Influenza-Unspecified 03/03/2015, 02/04/2018  . PPD  Test 07/26/2013  . Pneumococcal Conjugate-13 02/12/2016  . Pneumococcal Polysaccharide-23 07/26/2013  . Pneumococcal-Unspecified 05/04/2004  . Tdap 07/24/2011  . Zoster 12/02/2013   Influenza 2019 TDAP 2013 Pneumovax 2015 Prevnar: 2017 Shingles vaccine: 2015  Colonoscopy 03/14/2017 - 5 year follow up  ECHO 08/06/09 Neg cardiolite 2014 CXR 06/30/12 WNL U/S ABD 08/01/11- FATTY LIVER  EYE Exam: Dr. Quay Burow, [facility] 2019 Dental exam: Dr. Conley Canal, last 2019, has scheduled this week  Patient Care Team: Unk Pinto, MD as PCP - General (Internal Medicine) Rana Snare, MD as Consulting Physician (Urology) Irene Shipper, MD as Consulting Physician (Gastroenterology) Melvyn Novas, MD as Referring Physician (Neurology) Lavonna Monarch, MD as Consulting Physician (Dermatology) Dyke Maes, OD (Optometry) Josue Hector, MD as Consulting Physician (Cardiology)  Medical History:  Past Medical History:  Diagnosis Date  . Cardiomegaly   . Colon polyp   . Depression   . Fatty liver disease, nonalcoholic   . Gallstones   . Hypertension   . Hypogonadism male   . Mixed hyperlipidemia   . Obesity   . Pancreas (digestive gland) works poorly   . Prediabetes   . Sleep apnea   . Testosterone deficiency 08/03/2009  . Thyroid disease   . Vitamin D deficiency    Allergies Allergies  Allergen Reactions  . Ace Inhibitors Other (See Comments)    cough  . Bee Venom Other (See Comments)    SURGICAL HISTORY He  has a past surgical history that includes Cholecystectomy (1976); Appendectomy (1964); Ankle fracture surgery (Left, 1986); Carpal tunnel release (Bilateral, 1996); Tibia IM nail insertion (Right, 03/21/2016); Colonoscopy; and Polypectomy. FAMILY HISTORY His family history includes Diabetes in his father, mother, and sister; Heart disease in his father; Hypertension in his father. SOCIAL HISTORY He  reports that he quit smoking about 44 years ago. His smoking use included cigarettes. He has a 14.00 pack-year smoking history. He has never used smokeless tobacco. He reports that he does not drink alcohol or use drugs.   MEDICARE WELLNESS OBJECTIVES: Physical activity: Current Exercise Habits: Structured exercise class, Type of exercise: walking;strength training/weights, Time (Minutes): 60, Frequency (Times/Week): 3, Weekly Exercise (Minutes/Week): 180, Intensity: Mild, Exercise limited by: orthopedic condition(s) Cardiac risk factors: Cardiac Risk Factors include: advanced age (>50men, >2 women);hypertension;male gender;sedentary lifestyle;dyslipidemia;smoking/ tobacco exposure Depression/mood screen:   Depression screen [facility] 2/9 07/14/2018  Decreased Interest 0  Down, Depressed, Hopeless 0  PHQ - 2 Score 0    ADLs:  In your present state of health, do you have any difficulty performing the  following activities: 07/14/2018  Hearing? N  Vision? N  Difficulty concentrating or making decisions? N  Walking or climbing stairs? N  Dressing or bathing? N  Doing errands, shopping? N  Some recent data might be hidden     Cognitive Testing  Alert? Yes  Normal Appearance?Yes  Oriented to person? Yes  Place? Yes   Time? Yes  Recall of three objects?  Yes  Can perform simple calculations? Yes  Displays appropriate judgment?Yes  Can read the correct time from a watch face?Yes  EOL planning: Does Patient Have a Medical Advance Directive?: No Would patient like information on creating a medical advance directive?: No - Patient declined  Review of Systems:  Review of Systems  Constitutional: Negative.   HENT: Negative.   Eyes: Negative.   Respiratory: Negative.   Cardiovascular: Negative.   Gastrointestinal: Negative.   Genitourinary: Negative.   Musculoskeletal: Positive for myalgias.  Skin: Negative.   Neurological: Negative.  Endo/Heme/Allergies: Negative.   Psychiatric/Behavioral: Negative.     Physical Exam: Estimated body mass index is 34.61 kg/m as calculated from the following:   Height as of this encounter: 5' 8.5" (1.74 m).   Weight as of this encounter: 231 lb (104.8 kg). BP (!) 146/92   Pulse 82   Temp (!) 97.5 F (36.4 C)   Ht 5' 8.5" (1.74 m)   Wt 231 lb (104.8 kg)   SpO2 98%   BMI 34.61 kg/m  General Appearance: Well nourished, in no apparent distress.  Eyes: PERRLA, EOMs, conjunctiva no swelling or erythema, normal fundi and vessels.  Sinuses: No Frontal/maxillary tenderness  ENT/Mouth: Ext aud canals impacted bilaterally with cerumen, normal light reflex with TMs without erythema, bulging. Good dentition. No erythema, swelling, or exudate on post pharynx. Tonsils not swollen or erythematous. Hearing normal.  Neck: Supple, thyroid normal. No bruits  Respiratory: Respiratory effort normal, BS equal bilaterally without rales, rhonchi, wheezing or  stridor.  Cardio: RRR without murmurs, rubs or gallops. Brisk peripheral pulses without edema.  Chest: symmetric, with normal excursions and percussion.  Abdomen: Soft, obese, nontender, with large vertical and right horizontal scar no guarding, rebound, hernias, masses, or organomegaly.  Lymphatics: Non tender without lymphadenopathy.  Genitourinary: defer Musculoskeletal: Full ROM all peripheral extremities,5/5 strength, and normal gait.  Skin:   Warm, dry without rashes, lesions, ecchymosis. Neuro: Cranial nerves intact, reflexes decreased bilateral legs, worse left than right with decrease dorsiflexion/plantar on left. Normal muscle tone, no cerebellar symptoms. Sensation intact.  Psych: Awake and oriented X 3, normal affect, Insight and Judgment appropriate.    Medicare Attestation I have personally reviewed: The patient's medical and social history Their use of alcohol, tobacco or illicit drugs Their current medications and supplements The patient's functional ability including ADLs,fall risks, home safety risks, cognitive, and hearing and visual impairment Diet and physical activities Evidence for depression or mood disorders  The patient's weight, height, BMI, and visual acuity have been recorded in the chart.  I have made referrals, counseling, and provided education to the patient based on review of the above and I have provided the patient with a written personalized care plan for preventive services.     [name] [name] 10:19 AM [facility]

## 2018-07-14 ENCOUNTER — Ambulatory Visit (INDEPENDENT_AMBULATORY_CARE_PROVIDER_SITE_OTHER): Payer: PPO | Admitting: Adult Health

## 2018-07-14 ENCOUNTER — Encounter: Payer: Self-pay | Admitting: Adult Health

## 2018-07-14 VITALS — BP 146/92 | HR 82 | Temp 97.5°F | Ht 68.5 in | Wt 231.0 lb

## 2018-07-14 DIAGNOSIS — E782 Mixed hyperlipidemia: Secondary | ICD-10-CM | POA: Diagnosis not present

## 2018-07-14 DIAGNOSIS — R6889 Other general symptoms and signs: Secondary | ICD-10-CM

## 2018-07-14 DIAGNOSIS — I10 Essential (primary) hypertension: Secondary | ICD-10-CM

## 2018-07-14 DIAGNOSIS — R7303 Prediabetes: Secondary | ICD-10-CM

## 2018-07-14 DIAGNOSIS — E559 Vitamin D deficiency, unspecified: Secondary | ICD-10-CM

## 2018-07-14 DIAGNOSIS — H6123 Impacted cerumen, bilateral: Secondary | ICD-10-CM | POA: Diagnosis not present

## 2018-07-14 DIAGNOSIS — M609 Myositis, unspecified: Secondary | ICD-10-CM

## 2018-07-14 DIAGNOSIS — M21372 Foot drop, left foot: Secondary | ICD-10-CM

## 2018-07-14 DIAGNOSIS — R7309 Other abnormal glucose: Secondary | ICD-10-CM

## 2018-07-14 DIAGNOSIS — Z0001 Encounter for general adult medical examination with abnormal findings: Secondary | ICD-10-CM

## 2018-07-14 DIAGNOSIS — G4733 Obstructive sleep apnea (adult) (pediatric): Secondary | ICD-10-CM

## 2018-07-14 DIAGNOSIS — N529 Male erectile dysfunction, unspecified: Secondary | ICD-10-CM

## 2018-07-14 DIAGNOSIS — Z79899 Other long term (current) drug therapy: Secondary | ICD-10-CM | POA: Diagnosis not present

## 2018-07-14 DIAGNOSIS — Z0000 Encounter for general adult medical examination without abnormal findings: Secondary | ICD-10-CM

## 2018-07-14 DIAGNOSIS — E039 Hypothyroidism, unspecified: Secondary | ICD-10-CM

## 2018-07-14 DIAGNOSIS — F325 Major depressive disorder, single episode, in full remission: Secondary | ICD-10-CM

## 2018-07-14 NOTE — Patient Instructions (Addendum)
[name] [name] , Thank you for taking time to come for your Medicare Wellness Visit. I appreciate your ongoing commitment to your health goals. Please review the following plan we discussed and let me know if I can assist you in the future.   These are the goals we discussed: Goals    . Blood Pressure < 130/80    . Exercise 150 min/wk Moderate Activity    . Weight (lb) < 200 lb (90.7 kg)       This is a list of the screening recommended for you and due dates:  Health Maintenance  Topic Date Due  . Pneumonia vaccines (2 of 2 - PPSV23) 07/27/2018  . Tetanus Vaccine  07/23/2021  . Colon Cancer Screening  03/14/2022  . Flu Shot  Completed  .  Hepatitis C: One time screening is recommended by Center for Disease Control  (CDC) for  adults born from [birth year] through 1965.   Completed     Use a dropper or use a cap to put peroxide, olive oil,mineral oil or canola oil in the effected ear- 2-3 times a week. Let it soak for 20-30 min then you can take a shower or use a baby bulb with warm water to wash out the ear wax.  Do not use Qtips  HYPERTENSION INFORMATION  Please check your blood pressure at home and increase olmesartan to full tab (20 mg) if above goal frequently   Monitor your blood pressure at home, please keep a record and bring that in with you to your next office visit.   Go to the ER if any CP, SOB, nausea, dizziness, severe HA, changes vision/speech   Your most recent BP: BP: (!) 146/92   Take your medications faithfully as instructed. Maintain a healthy weight. Get at least 150 minutes of aerobic exercise per week. Minimize salt intake. Minimize alcohol intake  DASH Eating Plan DASH stands for "Dietary Approaches to Stop Hypertension." The DASH eating plan is a healthy eating plan that has been shown to reduce high blood pressure (hypertension). Additional health benefits may include reducing the risk of type 2 diabetes mellitus, heart disease, and stroke. The DASH eating plan  may also help with weight loss. WHAT DO I NEED TO KNOW ABOUT THE DASH EATING PLAN? For the DASH eating plan, you will follow these general guidelines:  Choose foods with a percent daily value for sodium of less than 5% (as listed on the food label).  Use salt-free seasonings or herbs instead of table salt or sea salt.  Check with your health care provider or pharmacist before using salt substitutes.  Eat lower-sodium products, often labeled as "lower sodium" or "no salt added."  Eat fresh foods.  Eat more vegetables, fruits, and low-fat dairy products.  Choose whole grains. Look for the word "whole" as the first word in the ingredient list.  Choose fish and skinless chicken or [place] more often than red meat. Limit fish, poultry, and meat to 6 oz (170 g) each day.  Limit sweets, desserts, sugars, and sugary drinks.  Choose heart-healthy fats.  Limit cheese to 1 oz (28 g) per day.  Eat more home-cooked food and less restaurant, buffet, and fast food.  Limit fried foods.  Cook foods using methods other than frying.  Limit canned vegetables. If you do use them, rinse them well to decrease the sodium.  When eating at a restaurant, ask that your food be prepared with less salt, or no salt if possible. WHAT FOODS  CAN I EAT? Seek help from a dietitian for individual calorie needs. Grains Whole grain or whole wheat bread. Brown rice. Whole grain or whole wheat pasta. Quinoa, bulgur, and whole grain cereals. Low-sodium cereals. Corn or whole wheat flour tortillas. Whole grain cornbread. Whole grain crackers. Low-sodium crackers. Vegetables Fresh or frozen vegetables (raw, steamed, roasted, or grilled). Low-sodium or reduced-sodium tomato and vegetable juices. Low-sodium or reduced-sodium tomato sauce and paste. Low-sodium or reduced-sodium canned vegetables.  Fruits All fresh, canned (in natural juice), or frozen fruits. Meat and Other Protein Products Ground beef (85% or leaner),  grass-fed beef, or beef trimmed of fat. Skinless chicken or [place]. Ground chicken or [place]. Pork trimmed of fat. All fish and seafood. Eggs. Dried beans, peas, or lentils. Unsalted nuts and seeds. Unsalted canned beans. Dairy Low-fat dairy products, such as skim or 1% milk, 2% or reduced-fat cheeses, low-fat ricotta or cottage cheese, or plain low-fat yogurt. Low-sodium or reduced-sodium cheeses. Fats and Oils Tub margarines without trans fats. Light or reduced-fat mayonnaise and salad dressings (reduced sodium). Avocado. Safflower, olive, or canola oils. Natural peanut or almond butter. Other Unsalted popcorn and pretzels. The items listed above may not be a complete list of recommended foods or beverages. Contact your dietitian for more options. WHAT FOODS ARE NOT RECOMMENDED? Grains White bread. White pasta. White rice. Refined cornbread. Bagels and croissants. Crackers that contain trans fat. Vegetables Creamed or fried vegetables. Vegetables in a cheese sauce. Regular canned vegetables. Regular canned tomato sauce and paste. Regular tomato and vegetable juices. Fruits Dried fruits. Canned fruit in light or heavy syrup. Fruit juice. Meat and Other Protein Products Fatty cuts of meat. Ribs, chicken wings, bacon, sausage, bologna, salami, chitterlings, fatback, hot dogs, bratwurst, and packaged luncheon meats. Salted nuts and seeds. Canned beans with salt. Dairy Whole or 2% milk, cream, half-and-half, and cream cheese. Whole-fat or sweetened yogurt. Full-fat cheeses or blue cheese. Nondairy creamers and whipped toppings. Processed cheese, cheese spreads, or cheese curds. Condiments Onion and garlic salt, seasoned salt, table salt, and sea salt. Canned and packaged gravies. Worcestershire sauce. Tartar sauce. Barbecue sauce. Teriyaki sauce. Soy sauce, including reduced sodium. Steak sauce. Fish sauce. Oyster sauce. Cocktail sauce. Horseradish. Ketchup and mustard. Meat flavorings and  tenderizers. Bouillon cubes. Hot sauce. Tabasco sauce. Marinades. Taco seasonings. Relishes. Fats and Oils Butter, stick margarine, lard, shortening, ghee, and bacon fat. Coconut, palm kernel, or palm oils. Regular salad dressings. Other Pickles and olives. Salted popcorn and pretzels. The items listed above may not be a complete list of foods and beverages to avoid. Contact your dietitian for more information. WHERE CAN I FIND MORE INFORMATION? National Heart, Lung, and Blood Institute: travelstabloid.com Document Released: 05/02/2011 Document Revised: 09/27/2013 Document Reviewed: 03/17/2013 [facility] Patient Information 2015 [place], [place]. This information is not intended to replace advice given to you by your health care provider. Make sure you discuss any questions you have with your health care provider.     Aim for 7+ servings of fruits and vegetables daily  65-80+ fluid ounces of water or unsweet tea for healthy kidneys  Limit to max 1 drink of alcohol per day; avoid smoking/tobacco  Limit animal fats in diet for cholesterol and heart health - choose grass fed whenever available  Avoid highly processed foods, and foods high in saturated/trans fats  Aim for low stress - take time to unwind and care for your mental health  Aim for 150 min of moderate intensity exercise weekly for heart health, and weights twice  weekly for bone health  Aim for 7-9 hours of sleep daily

## 2018-07-15 ENCOUNTER — Encounter: Payer: Self-pay | Admitting: Adult Health

## 2018-07-15 DIAGNOSIS — K760 Fatty (change of) liver, not elsewhere classified: Secondary | ICD-10-CM | POA: Insufficient documentation

## 2018-07-15 LAB — COMPLETE METABOLIC PANEL WITH GFR
AG Ratio: 1.7 (calc) (ref 1.0–2.5)
ALT: 41 U/L (ref 9–46)
AST: 43 U/L — ABNORMAL HIGH (ref 10–35)
Albumin: 4.6 g/dL (ref 3.6–5.1)
Alkaline phosphatase (APISO): 60 U/L (ref 35–144)
BUN: 20 mg/dL (ref 7–25)
CO2: 27 mmol/L (ref 20–32)
Calcium: 10 mg/dL (ref 8.6–10.3)
Chloride: 103 mmol/L (ref 98–110)
Creat: 0.93 mg/dL (ref 0.70–1.25)
GFR, Est African American: 98 mL/min/{1.73_m2} (ref >=60)
GFR, Est Non African American: 85 mL/min/{1.73_m2} (ref >=60)
Globulin: 2.7 g/dL (calc) (ref 1.9–3.7)
Glucose, Bld: 101 mg/dL — ABNORMAL HIGH (ref 65–99)
Potassium: 4.6 mmol/L (ref 3.5–5.3)
Sodium: 138 mmol/L (ref 135–146)
TOTAL PROTEIN: 7.3 g/dL (ref 6.1–8.1)
Total Bilirubin: 0.5 mg/dL (ref 0.2–1.2)

## 2018-07-15 LAB — CBC WITH DIFFERENTIAL/PLATELET
ABSOLUTE MONOCYTES: 785 {cells}/uL (ref 200–950)
BASOS ABS: 46 {cells}/uL (ref 0–200)
Basophils Relative: 0.6 %
Eosinophils Absolute: 162 cells/uL (ref 15–500)
Eosinophils Relative: 2.1 %
HCT: 42 % (ref 38.5–50.0)
HEMOGLOBIN: 14.4 g/dL (ref 13.2–17.1)
Lymphs Abs: 855 cells/uL (ref 850–3900)
MCH: 30.1 pg (ref 27.0–33.0)
MCHC: 34.3 g/dL (ref 32.0–36.0)
MCV: 87.9 fL (ref 80.0–100.0)
MPV: 10.8 fL (ref 7.5–12.5)
Monocytes Relative: 10.2 %
NEUTROS PCT: 76 %
Neutro Abs: 5852 cells/uL (ref 1500–7800)
Platelets: 221 10*3/uL (ref 140–400)
RBC: 4.78 10*6/uL (ref 4.20–5.80)
RDW: 13.8 % (ref 11.0–15.0)
Total Lymphocyte: 11.1 %
WBC: 7.7 10*3/uL (ref 3.8–10.8)

## 2018-07-15 LAB — LIPID PANEL
Cholesterol: 166 mg/dL (ref <200)
HDL: 36 mg/dL — ABNORMAL LOW (ref >=40)
LDL Cholesterol (Calc): 104 mg/dL (calc) — ABNORMAL HIGH
Non-HDL Cholesterol (Calc): 130 mg/dL (calc) — ABNORMAL HIGH (ref <130)
TRIGLYCERIDES: 144 mg/dL (ref <150)
Total CHOL/HDL Ratio: 4.6 (calc) (ref <5.0)

## 2018-07-15 LAB — HEMOGLOBIN A1C
Hgb A1c MFr Bld: 5.9 % of total Hgb — ABNORMAL HIGH (ref <5.7)
MEAN PLASMA GLUCOSE: 123 (calc)
eAG (mmol/L): 6.8 (calc)

## 2018-07-15 LAB — TSH: TSH: 1.21 mIU/L (ref 0.40–4.50)

## 2018-08-26 DIAGNOSIS — G4730 Sleep apnea, unspecified: Secondary | ICD-10-CM | POA: Diagnosis not present

## 2018-08-26 DIAGNOSIS — G4733 Obstructive sleep apnea (adult) (pediatric): Secondary | ICD-10-CM | POA: Diagnosis not present

## 2018-08-28 ENCOUNTER — Other Ambulatory Visit: Payer: Self-pay | Admitting: Physician Assistant

## 2018-09-01 ENCOUNTER — Ambulatory Visit: Payer: PPO | Admitting: Internal Medicine

## 2018-10-03 ENCOUNTER — Other Ambulatory Visit: Payer: Self-pay | Admitting: Internal Medicine

## 2018-10-04 ENCOUNTER — Other Ambulatory Visit: Payer: Self-pay | Admitting: Physician Assistant

## 2018-10-05 ENCOUNTER — Encounter: Payer: Self-pay | Admitting: Internal Medicine

## 2018-10-06 ENCOUNTER — Other Ambulatory Visit: Payer: Self-pay

## 2018-10-06 ENCOUNTER — Encounter: Payer: Self-pay | Admitting: Internal Medicine

## 2018-10-06 ENCOUNTER — Ambulatory Visit: Payer: PPO | Admitting: Internal Medicine

## 2018-10-06 VITALS — BP 130/70 | HR 65 | Ht 68.0 in | Wt 227.6 lb

## 2018-10-06 DIAGNOSIS — I10 Essential (primary) hypertension: Secondary | ICD-10-CM | POA: Diagnosis not present

## 2018-10-06 DIAGNOSIS — G4733 Obstructive sleep apnea (adult) (pediatric): Secondary | ICD-10-CM

## 2018-10-06 NOTE — Patient Instructions (Signed)
Order- DME [facility]- please replace old CPAP if eligible, change to auto 5-15, mask of choice, humidifier supplies, AirView/ card  Please call us if we can help

## 2018-10-06 NOTE — Progress Notes (Signed)
HPI Male former smoker followed for OSA complicated by obesity, HBP NPSG 08/25/95- Severe obstructive sleep apnea, AHI 58/hr. ---------------------------------------------------------------------------  08/28/2017- 68 year old male former smoker followed for OSA, complicated by obesity, HBP CPAP 13/Advanced Download 100% compliance AHI 3.3/hour.  He sleeps very well and is quite pleased to continue CPAP.  Pressure is comfortable.  Machine may be getting old but it works well for him.  We discussed potential replacement.  10/06/2018- 68 year old male former smoker followed for OSA, complicated by obesity, HBP, hypothyroid,  CPAP 13/Advanced>> today auto 5-15/ [facility] Download compliance 100%, AHI 2.8/hr -----OSA on CPAP, states no issues w/ mask (reports recieving new one recently) or pressure setting; states he's using it every night & with naps Body weight today 227 lbs He reports doing very well with no acute episodes or concerns.  He is comfortable with his CPAP.  Full beard does not interfere with mask.  He agrees it is time to replace this machine when eligible  ROS- see HPI + = positive Constitutional:   No-   weight loss, night sweats, fevers, chills, fatigue, lassitude. HEENT:   No-  headaches, difficulty swallowing, tooth/dental problems, sore throat,       No-  sneezing, itching, ear ache, nasal congestion, post nasal drip,  CV:  No-   chest pain, orthopnea, PND, swelling in lower extremities, anasarca, dizziness, palpitations Resp: No-   shortness of breath with exertion or at rest.              No-   productive cough,  No non-productive cough,  No- coughing up of blood.              No-   change in color of mucus.  No- wheezing.   Skin: No-   rash or lesions. GI:  No-   heartburn, indigestion, abdominal pain, nausea, vomiting,  GU: . MS:  No-   joint pain or swelling.  Neuro-     nothing unusual Psych:  No- change in mood or affect. No depression or anxiety.  No memory  loss.  OBJ General- Alert, Oriented, Affect-appropriate, Distress- none acute; +overweight. + Full beard Skin- rash-none, lesions- none, excoriation- none Lymphadenopathy- none Head- atraumatic            Eyes- Gross vision intact, PERRLA, conjunctivae clear secretions            Ears- Hearing, canals-normal            Nose- Clear, no-Septal dev, mucus, polyps, erosion, perforation             Throat- Mallampati III-IV , mucosa , drainage- none, tonsils- atrophic Neck- flexible , trachea midline, no stridor , thyroid nl, carotid no bruit Chest - symmetrical excursion , unlabored           Heart/CV- RRR , no murmur , no gallop  , no rub, nl s1 s2                           - JVD- none , edema- none, stasis changes- none, varices- none           Lung- clear to P&A, wheeze- none, cough- none , dullness-none, rub- none           Chest wall-  Abd-  Br/ Gen/ Rectal- Not done, not indicated Extrem- cyanosis- none, clubbing, none, atrophy- none, strength- nl Neuro- grossly intact to observation

## 2018-10-06 NOTE — Assessment & Plan Note (Signed)
He continues to benefit from CPAP and download confirms good compliance and control.  When eligible, we will replace this machine, changing to AutoPap 5-15

## 2018-10-06 NOTE — Assessment & Plan Note (Signed)
130/ 70 today.  We discussed importance of controlling sleep apnea for help with managing HBP.

## 2018-10-08 DIAGNOSIS — R269 Unspecified abnormalities of gait and mobility: Secondary | ICD-10-CM | POA: Diagnosis not present

## 2018-10-08 DIAGNOSIS — G4733 Obstructive sleep apnea (adult) (pediatric): Secondary | ICD-10-CM | POA: Diagnosis not present

## 2018-10-10 ENCOUNTER — Other Ambulatory Visit: Payer: Self-pay | Admitting: Adult Health

## 2018-11-03 ENCOUNTER — Other Ambulatory Visit: Payer: Self-pay | Admitting: Physician Assistant

## 2018-11-08 DIAGNOSIS — R269 Unspecified abnormalities of gait and mobility: Secondary | ICD-10-CM | POA: Diagnosis not present

## 2018-11-08 DIAGNOSIS — G4733 Obstructive sleep apnea (adult) (pediatric): Secondary | ICD-10-CM | POA: Diagnosis not present

## 2018-12-08 DIAGNOSIS — G4733 Obstructive sleep apnea (adult) (pediatric): Secondary | ICD-10-CM | POA: Diagnosis not present

## 2018-12-08 DIAGNOSIS — R269 Unspecified abnormalities of gait and mobility: Secondary | ICD-10-CM | POA: Diagnosis not present

## 2018-12-14 ENCOUNTER — Ambulatory Visit: Payer: PPO | Admitting: Internal Medicine

## 2019-01-08 DIAGNOSIS — R269 Unspecified abnormalities of gait and mobility: Secondary | ICD-10-CM | POA: Diagnosis not present

## 2019-01-08 DIAGNOSIS — G4733 Obstructive sleep apnea (adult) (pediatric): Secondary | ICD-10-CM | POA: Diagnosis not present

## 2019-01-22 NOTE — Progress Notes (Signed)
CPE  Assessment and Plan: -     Pneumococcal polysaccharide vaccine 23-valent greater than or equal to 68yo subcutaneous/IM  B12 deficiency -     Vitamin B12  Iron deficiency -     Iron,Total/Total Iron Binding Cap -     Ferritin  Essential hypertension Continue medication; continue BB, increase olmesartan to 40mg  FOLLOW UP 6 WEEKS FOR BP CHECK Monitor blood pressure at home; call if consistently over 130/80 Continue DASH diet.   Reminder to go to the ER if any CP, SOB, nausea, dizziness, severe HA, changes vision/speech, left arm numbness and tingling and jaw pain. - CBC with Differential/Platelet - CMP/GFR   Hypothyroidism, unspecified hypothyroidism type Hypothyroidism-check TSH level, continue medications the same, reminded to take on an empty stomach 30-30mins before food.  - TSH  Abnormal glucose Discussed general issues about diabetes pathophysiology and management., Educational material distributed., Suggested low cholesterol diet., Encouraged aerobic exercise., Discussed foot care., Reminded to get yearly retinal exam. - Hemoglobin A1c   Hyperlipidemia -continue medications, check lipids, decrease fatty foods, increase activity.  - Lipid panel  Morbid Obesity - follow up 3 months for progress monitoring - increase veggies, decrease carbs - long discussion about weight loss, diet, and exercise - weight goal of <200 lb set   Vitamin D deficiency At goal at recent check; continue to recommend supplementation for goal of 70-100 Defer vitamin D level  Medication management - Magnesium  Left foot drop Monitor, improved with increased exercise follow up ortho PRN  myositis MONITOR  Obstructive sleep apnea Continue CPAP  Erectile dysfunction Previously on testosterone and cialis    Major depression in remission Continue zoloft, wellbutrin Lifestyle discussed: diet/exerise, sleep hygiene, stress management, hydration   Discussed med's effects and SE's.  Screening labs and tests as requested with regular follow-up as recommended. Over 40 minutes of exam, counseling, chart review and critical decision making was performed Future Appointments  Date Time Provider [place]  07/23/2019  9:30 AM Liane Comber, NP [facility] None  10/06/2019  9:00 AM Deneise Lever, MD [facility] None  01/26/2020  2:00 PM Vicie Mutters, PA-C [facility] None    Plan:      _____________________________________________________________________________ HPI Patient presents for a CPE and for 3 month follow up on htn, hyperlipidemia, prediabetes, hypothyroidism, obesity, vit D def. He has sleep apnea with CPAP and hypogonadism consequence of his obesity.   He has hx of miositis and L foot drop and follows with ortho PRN, declined surgery.  He has dx of depression in remission on zoloft 150 mg daily, and more recently added wellbutrin 150 mg, due to some depressed mood related to son moving.  BMI is Body mass index is 32.99 kg/m., he has been working on diet and exercise. Marland Kitchen He admits he doesn't pay much attention to his diet.   Wt Readings from Last 3 Encounters:  01/26/19 217 lb (98.4 kg)  10/06/18 227 lb 9.6 oz (103.2 kg)  07/14/18 231 lb (104.8 kg)   He has not been checking BP at home, he is on metoprolol 25mg  BID, olmesartan 20 mg, today their BP is BP: (!) 162/98  He has a history of LVH with normal EF, last echo 2011, and normal cardiolite 2014.  He does workout.  He denies chest pain, shortness of breath, dizziness.  He is on cholesterol medication and denies myalgias, had intolerant to statins due to elevated CPK so on zetia and lopid.  His cholesterol is not at goal. The cholesterol last  visit was:   Lab Results  Component Value Date   CHOL 166 07/14/2018   HDL 36 (L) 07/14/2018   LDLCALC 104 (H) 07/14/2018   TRIG 144 07/14/2018   CHOLHDL 4.6 07/14/2018   He has been working on diet and exercise for prediabetes,  and denies  paresthesia of the feet, polydipsia, polyuria and visual disturbances. Last A1C in the office was:  Lab Results  Component Value Date   HGBA1C 5.9 (H) 07/14/2018   Patient is on Vitamin D supplement.   Lab Results  Component Value Date   VD25OH 25 01/20/2018    He is on thyroid medication. His medication was not changed last visit.   Lab Results  Component Value Date   TSH 1.21 07/14/2018    Last PSA was, follows with Dr. Suann Larry PRN, has had elevated PSA while on testosterone.  Lab Results  Component Value Date   PSA 0.7 01/20/2018    Current Medications:  Current Outpatient Medications on File Prior to Visit  Medication Sig Dispense Refill  . Ascorbic Acid (VITAMIN C) 1000 MG tablet Take 1,000 mg by mouth daily.     Marland Kitchen aspirin 325 MG tablet Take 325 mg by mouth daily.      . B Complex Vitamins (B COMPLEX PO) Take 1 tablet by mouth See admin instructions. Takes M,W,F    . buPROPion (WELLBUTRIN XL) 150 MG 24 hr tablet TAKE 1 TABLET BY MOUTH EVERY DAY IN THE MORNING 90 tablet 3  . EPINEPHrine (EPIPEN 2-PAK) 0.3 mg/0.3 mL IJ SOAJ injection Inject 0.3 mLs (0.3 mg total) into the muscle once. 2 Device 0  . ezetimibe (ZETIA) 10 MG tablet Take 1 tablet (10 mg total) by mouth daily. 90 tablet 3  . fish oil-omega-3 fatty acids 1000 MG capsule Take 2 g by mouth 3 (three) times daily.     . Flaxseed, Linseed, (FLAX SEED OIL) 1000 MG CAPS Take 2,000 mg by mouth daily.    Marland Kitchen gemfibrozil (LOPID) 600 MG tablet Take 1 tablet 2 x /day for Cholesterol 180 tablet 3  . levothyroxine (SYNTHROID, LEVOTHROID) 50 MCG tablet TAKE 1 TABLET BY MOUTH EVERY DAY 90 tablet 3  . MAGNESIUM PO Take 750 mg by mouth daily.     . metoprolol tartrate (LOPRESSOR) 25 MG tablet Take 1 tablet 2 x /day for BP 180 tablet 3  . Multiple Vitamin (MULTIVITAMIN) capsule Take by mouth.    . olmesartan (BENICAR) 20 MG tablet Take 1 tablet Daily for BP 90 tablet 3  . sertraline (ZOLOFT) 100 MG tablet Take 2 tablets Daily for Mood  180 tablet 1   No current facility-administered medications on file prior to visit.    Health Maintenance:  Immunization History  Administered Date(s) Administered  . Influenza Split 03/06/2012, 02/24/2013, 03/18/2014  . Influenza, High Dose Seasonal PF 02/12/2016, 04/09/2017  . Influenza-Unspecified 03/03/2015, 02/04/2018  . PPD Test 07/26/2013  . Pneumococcal Conjugate-13 02/12/2016  . Pneumococcal Polysaccharide-23 07/26/2013  . Pneumococcal-Unspecified 05/04/2004  . Tdap 07/24/2011  . Zoster 12/02/2013   Influenza 2019 TDAP 2013 Pneumovax 2015 Prevnar: 2017 Shingles vaccine: 2015  Colonoscopy 03/14/2017 - 5 year follow up  ECHO 08/06/09 Neg cardiolite 2014 CXR 06/30/12 WNL U/S ABD 08/01/11- FATTY LIVER  EYE Exam: Dr. Quay Burow, [facility] 2019 Dental exam: Dr. Conley Canal, last 2019, has scheduled this week  Patient Care Team: Unk Pinto, MD as PCP - General (Internal Medicine) Rana Snare, MD as Consulting Physician (Urology) Irene Shipper, MD as Consulting Physician (  Gastroenterology) Melvyn Novas, MD as Referring Physician (Neurology) Lavonna Monarch, MD as Consulting Physician (Dermatology) Dyke Maes, [place] (Optometry) Josue Hector, MD as Consulting Physician (Cardiology)  Medical History:  Past Medical History:  Diagnosis Date  . Cardiomegaly   . Colon polyp   . Depression   . Fatty liver disease, nonalcoholic   . Gallstones   . Hypertension   . Hypogonadism male   . Mixed hyperlipidemia   . Obesity   . Pancreas (digestive gland) works poorly   . Prediabetes   . Sleep apnea   . Testosterone deficiency 08/03/2009  . Thyroid disease   . Vitamin D deficiency    Allergies Allergies  Allergen Reactions  . Ace Inhibitors Other (See Comments)    cough  . Bee Venom Other (See Comments)    SURGICAL HISTORY He  has a past surgical history that includes Cholecystectomy (1976); Appendectomy (1964); Ankle fracture surgery (Left, 1986); Carpal tunnel  release (Bilateral, 1996); Tibia IM nail insertion (Right, 03/21/2016); Colonoscopy; and Polypectomy. FAMILY HISTORY His family history includes Diabetes in his father, mother, and sister; Heart disease in his father; Hypertension in his father. SOCIAL HISTORY He  reports that he quit smoking about 44 years ago. His smoking use included cigarettes. He has a 14.00 pack-year smoking history. He has never used smokeless tobacco. He reports that he does not drink alcohol or use drugs.   Review of Systems:  Review of Systems  Constitutional: Negative.   HENT: Negative.   Eyes: Negative.   Respiratory: Negative.   Cardiovascular: Negative.   Gastrointestinal: Negative.   Genitourinary: Negative.   Musculoskeletal: Positive for myalgias.  Skin: Negative.   Neurological: Negative.   Endo/Heme/Allergies: Negative.   Psychiatric/Behavioral: Negative.     Physical Exam: Estimated body mass index is 32.99 kg/m as calculated from the following:   Height as of this encounter: 5\' 8"  (1.727 m).   Weight as of this encounter: 217 lb (98.4 kg). BP (!) 162/98   Pulse 60   Temp (!) 97.2 F (36.2 C)   Ht 5\' 8"  (1.727 m)   Wt 217 lb (98.4 kg)   SpO2 99%   BMI 32.99 kg/m  General Appearance: Well nourished, in no apparent distress.  Eyes: PERRLA, EOMs, conjunctiva no swelling or erythema, normal fundi and vessels.  Sinuses: No Frontal/maxillary tenderness  ENT/Mouth: Ext aud canals clear, normal light reflex with TMs without erythema, bulging. Good dentition. No erythema, swelling, or exudate on post pharynx. Tonsils not swollen or erythematous. Hearing normal.  Neck: Supple, thyroid normal. No bruits  Respiratory: Respiratory effort normal, BS equal bilaterally without rales, rhonchi, wheezing or stridor.  Cardio: RRR without murmurs, rubs or gallops. Brisk peripheral pulses without edema.  Chest: symmetric, with normal excursions and percussion.  Abdomen: Soft, obese, nontender, with large  vertical and right horizontal scar no guarding, rebound, hernias, masses, or organomegaly.  Lymphatics: Non tender without lymphadenopathy.  Genitourinary: defer Musculoskeletal: Full ROM all peripheral extremities,5/5 strength, and normal gait. Right anterior shin with bone prominence from previous energy, no warmth, redness, discomfort.  Skin:   Warm, dry without rashes, lesions, ecchymosis. Neuro: Cranial nerves intact, reflexes decreased bilateral legs, worse left than right with decrease dorsiflexion/plantar on left. Normal muscle tone, no cerebellar symptoms. Sensation intact.  Psych: Awake and oriented X 3, normal affect, Insight and Judgment appropriate.     Vicie Mutters 9:31 AM [facility]

## 2019-01-25 ENCOUNTER — Encounter: Payer: Self-pay | Admitting: Physician Assistant

## 2019-01-26 ENCOUNTER — Other Ambulatory Visit: Payer: Self-pay

## 2019-01-26 ENCOUNTER — Ambulatory Visit (INDEPENDENT_AMBULATORY_CARE_PROVIDER_SITE_OTHER): Payer: PPO | Admitting: Physician Assistant

## 2019-01-26 ENCOUNTER — Encounter: Payer: Self-pay | Admitting: Physician Assistant

## 2019-01-26 VITALS — BP 162/98 | HR 60 | Temp 97.2°F | Ht 68.0 in | Wt 217.0 lb

## 2019-01-26 DIAGNOSIS — Z0000 Encounter for general adult medical examination without abnormal findings: Secondary | ICD-10-CM

## 2019-01-26 DIAGNOSIS — M609 Myositis, unspecified: Secondary | ICD-10-CM

## 2019-01-26 DIAGNOSIS — E611 Iron deficiency: Secondary | ICD-10-CM | POA: Diagnosis not present

## 2019-01-26 DIAGNOSIS — E039 Hypothyroidism, unspecified: Secondary | ICD-10-CM | POA: Diagnosis not present

## 2019-01-26 DIAGNOSIS — Z23 Encounter for immunization: Secondary | ICD-10-CM | POA: Diagnosis not present

## 2019-01-26 DIAGNOSIS — E782 Mixed hyperlipidemia: Secondary | ICD-10-CM

## 2019-01-26 DIAGNOSIS — R7309 Other abnormal glucose: Secondary | ICD-10-CM

## 2019-01-26 DIAGNOSIS — E349 Endocrine disorder, unspecified: Secondary | ICD-10-CM

## 2019-01-26 DIAGNOSIS — Z79899 Other long term (current) drug therapy: Secondary | ICD-10-CM

## 2019-01-26 DIAGNOSIS — I10 Essential (primary) hypertension: Secondary | ICD-10-CM | POA: Diagnosis not present

## 2019-01-26 DIAGNOSIS — K760 Fatty (change of) liver, not elsewhere classified: Secondary | ICD-10-CM

## 2019-01-26 DIAGNOSIS — M21372 Foot drop, left foot: Secondary | ICD-10-CM

## 2019-01-26 DIAGNOSIS — E538 Deficiency of other specified B group vitamins: Secondary | ICD-10-CM

## 2019-01-26 DIAGNOSIS — Z136 Encounter for screening for cardiovascular disorders: Secondary | ICD-10-CM

## 2019-01-26 DIAGNOSIS — G4733 Obstructive sleep apnea (adult) (pediatric): Secondary | ICD-10-CM

## 2019-01-26 DIAGNOSIS — Z0001 Encounter for general adult medical examination with abnormal findings: Secondary | ICD-10-CM

## 2019-01-26 DIAGNOSIS — E559 Vitamin D deficiency, unspecified: Secondary | ICD-10-CM

## 2019-01-26 DIAGNOSIS — F325 Major depressive disorder, single episode, in full remission: Secondary | ICD-10-CM

## 2019-01-26 DIAGNOSIS — N529 Male erectile dysfunction, unspecified: Secondary | ICD-10-CM

## 2019-01-26 MED ORDER — OLMESARTAN MEDOXOMIL 40 MG PO TABS: ORAL_TABLET | ORAL | 3 refills | Status: DC

## 2019-01-26 NOTE — Patient Instructions (Signed)
Increase the benicar to 40 mg a day from 20 mg Can take two until you run out  HYPERTENSION INFORMATION  Monitor your blood pressure at home, please keep a record and bring that in with you to your next office visit.   Go to the ER if any CP, SOB, nausea, dizziness, severe HA, changes vision/speech  Testing/Procedures: HOW TO TAKE YOUR BLOOD PRESSURE:  Rest 5 minutes before taking your blood pressure.  Don't smoke or drink caffeinated beverages for at least 30 minutes before.  Take your blood pressure before (not after) you eat.  Sit comfortably with your back supported and both feet on the floor (don't cross your legs).  Elevate your arm to heart level on a table or a desk.  Use the proper sized cuff. It should fit smoothly and snugly around your bare upper arm. There should be enough room to slip a fingertip under the cuff. The bottom edge of the cuff should be 1 inch above the crease of the elbow.  Due to a recent study, SPRINT, we have changed our goal for the systolic or top blood pressure number. Ideally we want your top number at 120.  In the [facility] Trial, 5000 people were randomized to a goal BP of 120 and 5000 people were randomized to a goal BP of less than 140. The patients with the goal BP at 120 had LESS DEMENTIA, LESS HEART ATTACKS, AND LESS STROKES, AS WELL AS OVERALL DECREASED MORTALITY OR DEATH RATE.   There was another study that showed taking your blood pressure medications at night decrease cardiovascular events.  However if you are on a fluid pill, please take this in the morning.   If you are willing, our goal BP is the top number of 120.  Your most recent BP: BP: (!) 162/98   Take your medications faithfully as instructed. Maintain a healthy weight. Get at least 150 minutes of aerobic exercise per week. Minimize salt intake. Minimize alcohol intake  DASH Eating Plan DASH stands for "Dietary Approaches to Stop Hypertension." The DASH eating plan is a  healthy eating plan that has been shown to reduce high blood pressure (hypertension). Additional health benefits may include reducing the risk of type 2 diabetes mellitus, heart disease, and stroke. The DASH eating plan may also help with weight loss. WHAT DO I NEED TO KNOW ABOUT THE DASH EATING PLAN? For the DASH eating plan, you will follow these general guidelines:  Choose foods with a percent daily value for sodium of less than 5% (as listed on the food label).  Use salt-free seasonings or herbs instead of table salt or sea salt.  Check with your health care provider or pharmacist before using salt substitutes.  Eat lower-sodium products, often labeled as "lower sodium" or "no salt added."  Eat fresh foods.  Eat more vegetables, fruits, and low-fat dairy products.  Choose whole grains. Look for the word "whole" as the first word in the ingredient list.  Choose fish and skinless chicken or [place] more often than red meat. Limit fish, poultry, and meat to 6 oz (170 g) each day.  Limit sweets, desserts, sugars, and sugary drinks.  Choose heart-healthy fats.  Limit cheese to 1 oz (28 g) per day.  Eat more home-cooked food and less restaurant, buffet, and fast food.  Limit fried foods.  Cook foods using methods other than frying.  Limit canned vegetables. If you do use them, rinse them well to decrease the sodium.  When eating  at a restaurant, ask that your food be prepared with less salt, or no salt if possible. WHAT FOODS CAN I EAT? Seek help from a dietitian for individual calorie needs. Grains Whole grain or whole wheat bread. Brown rice. Whole grain or whole wheat pasta. Quinoa, bulgur, and whole grain cereals. Low-sodium cereals. Corn or whole wheat flour tortillas. Whole grain cornbread. Whole grain crackers. Low-sodium crackers. Vegetables Fresh or frozen vegetables (raw, steamed, roasted, or grilled). Low-sodium or reduced-sodium tomato and vegetable juices. Low-sodium  or reduced-sodium tomato sauce and paste. Low-sodium or reduced-sodium canned vegetables.  Fruits All fresh, canned (in natural juice), or frozen fruits. Meat and Other Protein Products Ground beef (85% or leaner), grass-fed beef, or beef trimmed of fat. Skinless chicken or [place]. Ground chicken or [place]. Pork trimmed of fat. All fish and seafood. Eggs. Dried beans, peas, or lentils. Unsalted nuts and seeds. Unsalted canned beans. Dairy Low-fat dairy products, such as skim or 1% milk, 2% or reduced-fat cheeses, low-fat ricotta or cottage cheese, or plain low-fat yogurt. Low-sodium or reduced-sodium cheeses. Fats and Oils Tub margarines without trans fats. Light or reduced-fat mayonnaise and salad dressings (reduced sodium). Avocado. Safflower, olive, or canola oils. Natural peanut or almond butter. Other Unsalted popcorn and pretzels. The items listed above may not be a complete list of recommended foods or beverages. Contact your dietitian for more options. WHAT FOODS ARE NOT RECOMMENDED? Grains White bread. White pasta. White rice. Refined cornbread. Bagels and croissants. Crackers that contain trans fat. Vegetables Creamed or fried vegetables. Vegetables in a cheese sauce. Regular canned vegetables. Regular canned tomato sauce and paste. Regular tomato and vegetable juices. Fruits Dried fruits. Canned fruit in light or heavy syrup. Fruit juice. Meat and Other Protein Products Fatty cuts of meat. Ribs, chicken wings, bacon, sausage, bologna, salami, chitterlings, fatback, hot dogs, bratwurst, and packaged luncheon meats. Salted nuts and seeds. Canned beans with salt. Dairy Whole or 2% milk, cream, half-and-half, and cream cheese. Whole-fat or sweetened yogurt. Full-fat cheeses or blue cheese. Nondairy creamers and whipped toppings. Processed cheese, cheese spreads, or cheese curds. Condiments Onion and garlic salt, seasoned salt, table salt, and sea salt. Canned and packaged gravies.  Worcestershire sauce. Tartar sauce. Barbecue sauce. Teriyaki sauce. Soy sauce, including reduced sodium. Steak sauce. Fish sauce. Oyster sauce. Cocktail sauce. Horseradish. Ketchup and mustard. Meat flavorings and tenderizers. Bouillon cubes. Hot sauce. Tabasco sauce. Marinades. Taco seasonings. Relishes. Fats and Oils Butter, stick margarine, lard, shortening, ghee, and bacon fat. Coconut, palm kernel, or palm oils. Regular salad dressings. Other Pickles and olives. Salted popcorn and pretzels. The items listed above may not be a complete list of foods and beverages to avoid. Contact your dietitian for more information. WHERE CAN I FIND MORE INFORMATION? National Heart, Lung, and Blood Institute: travelstabloid.com Document Released: 05/02/2011 Document Revised: 09/27/2013 Document Reviewed: 03/17/2013 [facility] Patient Information 2015 [employer], [place]. This information is not intended to replace advice given to you by your health care provider. Make sure you discuss any questions you have with your health care provider.   Check out  Mini habits for weight loss book  2 apps for tracking food is myfitness pal  loseit OR can take picture of your food  Know what a healthy weight is for you (roughly BMI <25) and aim to maintain this  Aim for 7+ servings of fruits and vegetables daily  65-80+ fluid ounces of water or unsweet tea for healthy kidneys  Limit to max 1 drink of alcohol per day;  avoid smoking/tobacco  Limit animal fats in diet for cholesterol and heart health - choose grass fed whenever available  Avoid highly processed foods, and foods high in saturated/trans fats  Aim for low stress - take time to unwind and care for your mental health  Aim for 150 min of moderate intensity exercise weekly for heart health, and weights twice weekly for bone health  Aim for 7-9 hours of sleep daily

## 2019-01-27 LAB — CBC WITH DIFFERENTIAL/PLATELET
Absolute Monocytes: 476 cells/uL (ref 200–950)
Basophils Absolute: 51 cells/uL (ref 0–200)
Basophils Relative: 1.5 %
Eosinophils Absolute: 129 cells/uL (ref 15–500)
Eosinophils Relative: 3.8 %
HCT: 45.9 % (ref 38.5–50.0)
Hemoglobin: 15.5 g/dL (ref 13.2–17.1)
Lymphs Abs: 952 cells/uL (ref 850–3900)
MCH: 30.9 pg (ref 27.0–33.0)
MCHC: 33.8 g/dL (ref 32.0–36.0)
MCV: 91.4 fL (ref 80.0–100.0)
MPV: 10.7 fL (ref 7.5–12.5)
Monocytes Relative: 14 %
Neutro Abs: 1792 cells/uL (ref 1500–7800)
Neutrophils Relative %: 52.7 %
Platelets: 192 10*3/uL (ref 140–400)
RBC: 5.02 10*6/uL (ref 4.20–5.80)
RDW: 13.1 % (ref 11.0–15.0)
Total Lymphocyte: 28 %
WBC: 3.4 10*3/uL — ABNORMAL LOW (ref 3.8–10.8)

## 2019-01-27 LAB — COMPLETE METABOLIC PANEL WITH GFR
AG Ratio: 1.9 (calc) (ref 1.0–2.5)
ALT: 44 U/L (ref 9–46)
AST: 48 U/L — ABNORMAL HIGH (ref 10–35)
Albumin: 5 g/dL (ref 3.6–5.1)
Alkaline phosphatase (APISO): 53 U/L (ref 35–144)
BUN: 21 mg/dL (ref 7–25)
CO2: 27 mmol/L (ref 20–32)
Calcium: 10.7 mg/dL — ABNORMAL HIGH (ref 8.6–10.3)
Chloride: 104 mmol/L (ref 98–110)
Creat: 0.96 mg/dL (ref 0.70–1.25)
GFR, Est African American: 94 mL/min/{1.73_m2} (ref >=60)
GFR, Est Non African American: 81 mL/min/{1.73_m2} (ref >=60)
Globulin: 2.6 g/dL (calc) (ref 1.9–3.7)
Glucose, Bld: 96 mg/dL (ref 65–99)
Potassium: 5.2 mmol/L (ref 3.5–5.3)
Sodium: 142 mmol/L (ref 135–146)
Total Bilirubin: 0.6 mg/dL (ref 0.2–1.2)
Total Protein: 7.6 g/dL (ref 6.1–8.1)

## 2019-01-27 LAB — HEMOGLOBIN A1C
Hgb A1c MFr Bld: 5.7 % of total Hgb — ABNORMAL HIGH (ref <5.7)
Mean Plasma Glucose: 117 (calc)
eAG (mmol/L): 6.5 (calc)

## 2019-01-27 LAB — URINALYSIS, ROUTINE W REFLEX MICROSCOPIC
Bilirubin Urine: NEGATIVE
Glucose, UA: NEGATIVE
Hgb urine dipstick: NEGATIVE
Ketones, ur: NEGATIVE
Leukocytes,Ua: NEGATIVE
Nitrite: NEGATIVE
Protein, ur: NEGATIVE
Specific Gravity, Urine: 1.023 (ref 1.001–1.03)
pH: 6.5 (ref 5.0–8.0)

## 2019-01-27 LAB — MICROALBUMIN / CREATININE URINE RATIO
Creatinine, Urine: 177 mg/dL (ref 20–320)
Microalb Creat Ratio: 17 mcg/mg creat (ref <30)
Microalb, Ur: 3 mg/dL

## 2019-01-27 LAB — IRON, TOTAL/TOTAL IRON BINDING CAP
%SAT: 29 % (calc) (ref 20–48)
Iron: 158 ug/dL (ref 50–180)
TIBC: 536 mcg/dL (calc) — ABNORMAL HIGH (ref 250–425)

## 2019-01-27 LAB — VITAMIN B12: Vitamin B-12: 737 pg/mL (ref 200–1100)

## 2019-01-27 LAB — LIPID PANEL
Cholesterol: 185 mg/dL (ref <200)
HDL: 35 mg/dL — ABNORMAL LOW (ref >=40)
LDL Cholesterol (Calc): 123 mg/dL (calc) — ABNORMAL HIGH
Non-HDL Cholesterol (Calc): 150 mg/dL (calc) — ABNORMAL HIGH (ref <130)
Total CHOL/HDL Ratio: 5.3 (calc) — ABNORMAL HIGH (ref <5.0)
Triglycerides: 159 mg/dL — ABNORMAL HIGH (ref <150)

## 2019-01-27 LAB — TSH: TSH: 1.18 mIU/L (ref 0.40–4.50)

## 2019-01-27 LAB — MAGNESIUM: Magnesium: 2.3 mg/dL (ref 1.5–2.5)

## 2019-01-27 LAB — FERRITIN: Ferritin: 52 ng/mL (ref 24–380)

## 2019-01-27 LAB — VITAMIN D 25 HYDROXY (VIT D DEFICIENCY, FRACTURES): Vit D, 25-Hydroxy: 78 ng/mL (ref 30–100)

## 2019-02-04 ENCOUNTER — Other Ambulatory Visit: Payer: Self-pay | Admitting: Internal Medicine

## 2019-02-08 DIAGNOSIS — R269 Unspecified abnormalities of gait and mobility: Secondary | ICD-10-CM | POA: Diagnosis not present

## 2019-02-08 DIAGNOSIS — G4733 Obstructive sleep apnea (adult) (pediatric): Secondary | ICD-10-CM | POA: Diagnosis not present

## 2019-03-04 NOTE — Progress Notes (Signed)
THIS ENCOUNTER IS A VIRTUAL/TELEPHONE VISIT DUE TO COVID-19 - PATIENT WAS NOT SEEN IN THE OFFICE.  PATIENT HAS CONSENTED TO VIRTUAL VISIT / TELEMEDICINE VISIT  This provider placed a call to [name] [name] using telephone, his appointment was changed to a virtual office visit to reduce the risk of exposure to the COVID-19 virus and to help [name] [name] remain healthy and safe. The virtual visit will also provide continuity of care. He verbalizes understanding.   Assessment and Plan:  Essential hypertension Will continue the benicar 40 mg and lopressor, will add on HCTZ due to elevated diastolic, follow up 2 weeks.   -     hydrochlorothiazide (HYDRODIURIL) 12.5 MG tablet; Take 1 tablet (12.5 mg total) by mouth daily.     Future Appointments  Date Time Provider [place]  07/23/2019  9:30 AM Liane Comber, NP [facility] None  10/06/2019  9:00 AM Baird Lyons D, MD [facility] None  01/26/2020  2:00 PM Vicie Mutters, PA-C [facility] None    HPI 68 y.o.male presents for virtual follow up for blood pressure and obesity.  Last visit his benicar was increase to 40 mg, he is also on lopressor 25mg  BID[facility] lowest was 136/82.  BP Readings from Last 3 Encounters:  03/09/19 (!) 155/88  01/26/19 (!) 162/98  10/06/18 130/70   BMI is Body mass index is 32.69 kg/m., he is working on diet and exercise. Wt Readings from Last 3 Encounters:  03/09/19 215 lb (97.5 kg)  01/26/19 217 lb (98.4 kg)  10/06/18 227 lb 9.6 oz (103.2 kg)   He is on thyroid medication. His medication was not changed last visit.   Lab Results  Component Value Date   TSH 1.18 01/26/2019  .    Patient Active Problem List   Diagnosis Date Noted  . Fatty liver disease, nonalcoholic [id number]  . Abnormal glucose 07/13/2018  . Erectile dysfunction 09/12/2014  . Major depression in remission ([place]) 09/12/2014  . Myositis 12/31/2013  . Medication management 12/31/2013  .  Hypothyroidism 12/31/2013  . Left foot drop 08/30/2013  . Vitamin D deficiency   . Hyperlipidemia 08/04/2009  . Morbid obesity ([place]) 08/03/2009  . Essential hypertension 08/01/2009  . Obstructive sleep apnea 08/01/2009     Current Outpatient Medications (Endocrine & Metabolic):  .  levothyroxine (SYNTHROID) 50 MCG tablet, TAKE 1 TABLET BY MOUTH EVERY DAY  Current Outpatient Medications (Cardiovascular):  Marland Kitchen  EPINEPHrine (EPIPEN 2-PAK) 0.3 mg/0.3 mL IJ SOAJ injection, Inject 0.3 mLs (0.3 mg total) into the muscle once. .  ezetimibe (ZETIA) 10 MG tablet, Take 1 tablet (10 mg total) by mouth daily. Marland Kitchen  gemfibrozil (LOPID) 600 MG tablet, Take 1 tablet 2 x /day for Cholesterol .  metoprolol tartrate (LOPRESSOR) 25 MG tablet, Take 1 tablet 2 x /day for BP .  olmesartan (BENICAR) 40 MG tablet, Take 1 tablet Daily for BP   Current Outpatient Medications (Analgesics):  .  aspirin 325 MG tablet, Take 325 mg by mouth daily.     Current Outpatient Medications (Other):  Marland Kitchen  Ascorbic Acid (VITAMIN C) 1000 MG tablet, Take 1,000 mg by mouth daily.  .  B Complex Vitamins (B COMPLEX PO), Take 1 tablet by mouth See admin instructions. Takes M,W,F .  buPROPion (WELLBUTRIN XL) 150 MG 24 hr tablet, TAKE 1 TABLET BY MOUTH EVERY DAY IN THE MORNING .  fish oil-omega-3 fatty acids 1000 MG capsule, Take 2 g by mouth 3 (three) times daily.  Marland Kitchen  Flaxseed, Linseed, (FLAX SEED OIL) 1000 MG CAPS, Take 2,000 mg by mouth daily. Marland Kitchen  MAGNESIUM PO, Take 750 mg by mouth daily.  .  Multiple Vitamin (MULTIVITAMIN) capsule, Take by mouth. .  sertraline (ZOLOFT) 100 MG tablet, Take 2 tablets Daily for Mood  Allergies  Allergen Reactions  . Ace Inhibitors Other (See Comments)    cough  . Bee Venom Other (See Comments)    ROS: all negative except above.   Physical Exam: Filed Weights   03/09/19 1126  Weight: 215 lb (97.5 kg)   BP (!) 155/88   Wt 215 lb (97.5 kg)   BMI 32.69 kg/m   General Appearance:Well  sounding, in no apparent distress.  ENT/Mouth: No hoarseness, No cough for duration of visit.  Respiratory: completing full sentences without distress, without audible wheeze Neuro: Awake and oriented X 3,  Psych:  Insight and Judgment appropriate.   Vicie Mutters, PA-C 11:32 AM [facility]

## 2019-03-09 ENCOUNTER — Encounter: Payer: Self-pay | Admitting: Physician Assistant

## 2019-03-09 ENCOUNTER — Ambulatory Visit: Payer: PPO | Admitting: Physician Assistant

## 2019-03-09 ENCOUNTER — Other Ambulatory Visit: Payer: Self-pay

## 2019-03-09 VITALS — BP 155/88 | Wt 215.0 lb

## 2019-03-09 DIAGNOSIS — I10 Essential (primary) hypertension: Secondary | ICD-10-CM | POA: Diagnosis not present

## 2019-03-09 MED ORDER — HYDROCHLOROTHIAZIDE 12.5 MG PO TABS: 12.5000 mg | ORAL_TABLET | Freq: Every day | ORAL | 1 refills | Status: DC

## 2019-03-10 DIAGNOSIS — R269 Unspecified abnormalities of gait and mobility: Secondary | ICD-10-CM | POA: Diagnosis not present

## 2019-03-10 DIAGNOSIS — G4733 Obstructive sleep apnea (adult) (pediatric): Secondary | ICD-10-CM | POA: Diagnosis not present

## 2019-03-24 DIAGNOSIS — D1801 Hemangioma of skin and subcutaneous tissue: Secondary | ICD-10-CM | POA: Diagnosis not present

## 2019-03-24 DIAGNOSIS — L814 Other melanin hyperpigmentation: Secondary | ICD-10-CM | POA: Diagnosis not present

## 2019-03-24 DIAGNOSIS — D225 Melanocytic nevi of trunk: Secondary | ICD-10-CM | POA: Diagnosis not present

## 2019-03-24 DIAGNOSIS — L570 Actinic keratosis: Secondary | ICD-10-CM | POA: Diagnosis not present

## 2019-04-07 ENCOUNTER — Encounter: Payer: Self-pay | Admitting: Adult Health

## 2019-04-07 ENCOUNTER — Other Ambulatory Visit: Payer: Self-pay

## 2019-04-07 ENCOUNTER — Ambulatory Visit (INDEPENDENT_AMBULATORY_CARE_PROVIDER_SITE_OTHER): Payer: PPO | Admitting: Adult Health

## 2019-04-07 VITALS — BP 134/86 | HR 61 | Temp 97.3°F | Wt 220.0 lb

## 2019-04-07 DIAGNOSIS — L089 Local infection of the skin and subcutaneous tissue, unspecified: Secondary | ICD-10-CM | POA: Diagnosis not present

## 2019-04-07 DIAGNOSIS — I10 Essential (primary) hypertension: Secondary | ICD-10-CM | POA: Diagnosis not present

## 2019-04-07 DIAGNOSIS — S81812A Laceration without foreign body, left lower leg, initial encounter: Secondary | ICD-10-CM

## 2019-04-07 DIAGNOSIS — S80812A Abrasion, left lower leg, initial encounter: Secondary | ICD-10-CM

## 2019-04-07 DIAGNOSIS — Z23 Encounter for immunization: Secondary | ICD-10-CM | POA: Diagnosis not present

## 2019-04-07 MED ORDER — DOXYCYCLINE HYCLATE 100 MG PO CAPS: ORAL_CAPSULE | ORAL | 0 refills | Status: DC

## 2019-04-07 NOTE — Patient Instructions (Addendum)
     Doxycycline tablets or capsules What is this medicine? DOXYCYCLINE (dox i SYE kleen) is a tetracycline antibiotic. It kills certain bacteria or stops their growth. It is used to treat many kinds of infections, like dental, skin, respiratory, and urinary tract infections. It also treats acne, Lyme disease, malaria, and certain sexually transmitted infections. This medicine may be used for other purposes; ask your health care provider or pharmacist if you have questions. COMMON BRAND NAME(S): Acticlate, Adoxa, Adoxa CK, Adoxa Pak, Adoxa TT, Alodox, Avidoxy, Doxal, LYMEPAK, Mondoxyne NL, Monodox, Morgidox 1x, Morgidox 1x Kit, Morgidox 2x, Morgidox 2x Kit, NutriDox, Ocudox, TARGADOX, Vibra-Tabs, Vibramycin What should I tell my health care provider before I take this medicine? They need to know if you have any of these conditions:  liver disease  long exposure to sunlight like working outdoors  stomach problems like colitis  an unusual or allergic reaction to doxycycline, tetracycline antibiotics, other medicines, foods, dyes, or preservatives  pregnant or trying to get pregnant  breast-feeding How should I use this medicine? Take this medicine by mouth with a full glass of water. Follow the directions on the prescription label. It is best to take this medicine without food, but if it upsets your stomach take it with food. Take your medicine at regular intervals. Do not take your medicine more often than directed. Take all of your medicine as directed even if you think you are better. Do not skip doses or stop your medicine early. Talk to your pediatrician regarding the use of this medicine in children. While this drug may be prescribed for selected conditions, precautions do apply. Overdosage: If you think you have taken too much of this medicine contact a poison control center or emergency room at once. NOTE: This medicine is only for you. Do not share this medicine with  others. What if I miss a dose? If you miss a dose, take it as soon as you can. If it is almost time for your next dose, take only that dose. Do not take double or extra doses. What may interact with this medicine?  antacids  barbiturates  birth control pills  bismuth subsalicylate  carbamazepine  methoxyflurane  other antibiotics  phenytoin  vitamins that contain iron  warfarin This list may not describe all possible interactions. Give your health care provider a list of all the medicines, herbs, non-prescription drugs, or dietary supplements you use. Also tell them if you smoke, drink alcohol, or use illegal drugs. Some items may interact with your medicine. What should I watch for while using this medicine? Tell your doctor or health care professional if your symptoms do not improve. Do not treat diarrhea with over the counter products. Contact your doctor if you have diarrhea that lasts more than 2 days or if it is severe and watery. Do not take this medicine just before going to bed. It may not dissolve properly when you lay down and can cause pain in your throat. Drink plenty of fluids while taking this medicine to also help reduce irritation in your throat. This medicine can make you more sensitive to the sun. Keep out of the sun. If you cannot avoid being in the sun, wear protective clothing and use sunscreen. Do not use sun lamps or tanning beds/booths. Birth control pills may not work properly while you are taking this medicine. Talk to your doctor about using an extra method of birth control. If you are being treated for a sexually transmitted infection,   avoid sexual contact until you have finished your treatment. Your sexual partner may also need treatment. Avoid antacids, aluminum, calcium, magnesium, and iron products for 4 hours before and 2 hours after taking a dose of this medicine. If you are using this medicine to prevent malaria, you should still protect yourself  from contact with mosquitos. Stay in screened-in areas, use mosquito nets, keep your body covered, and use an insect repellent. What side effects may I notice from receiving this medicine? Side effects that you should report to your doctor or health care professional as soon as possible:  allergic reactions like skin rash, itching or hives, swelling of the face, lips, or tongue  difficulty breathing  fever  itching in the rectal or genital area  pain on swallowing  rash, fever, and swollen lymph nodes  redness, blistering, peeling or loosening of the skin, including inside the mouth  severe stomach pain or cramps  unusual bleeding or bruising  unusually weak or tired  yellowing of the eyes or skin Side effects that usually do not require medical attention (report to your doctor or health care professional if they continue or are bothersome):  diarrhea  loss of appetite  nausea, vomiting This list may not describe all possible side effects. Call your doctor for medical advice about side effects. You may report side effects to FDA at 1-800-FDA-1088. Where should I keep my medicine? Keep out of the reach of children. Store at room temperature, below 30 degrees C (86 degrees F). Protect from light. Keep container tightly closed. Throw away any unused medicine after the expiration date. Taking this medicine after the expiration date can make you seriously ill. NOTE: This sheet is a summary. It may not cover all possible information. If you have questions about this medicine, talk to your doctor, pharmacist, or health care provider.  2020 [employer]/Gold Standard (2018-08-13 13:44:53)

## 2019-04-07 NOTE — Progress Notes (Signed)
Assessment and Plan:  Trasean was seen today for extremity laceration.  Diagnoses and all orders for this visit:  Abrasion of left lower leg with infection, initial encounter With infection; boost tetanus as last 5+ years ago Initiate doxycycline BID x 10 days; monitor progress; keep clean, irrigate with warm soapy water twice daily  Follow up if any worse or if not resolving with abx; call if redness or pain is worse, fever/chills, red streaking on extremity present to ED -     doxycycline (VIBRAMYCIN) 100 MG capsule; Take 1 capsule twice daily with food  Need for tetanus booster -     Td vaccine greater than or equal to 7yo preservative free IM  Essential hypertension Initially elevated; improved with recheck; home reports fairly controlled  Continue medications Monitor blood pressure at home; call if consistently over 130/80, he is working on weight loss Continue DASH diet.   Reminder to go to the ER if any CP, SOB, nausea, dizziness, severe HA, changes vision/speech, left arm numbness and tingling and jaw pain.   Further disposition pending results of labs. Discussed med's effects and SE's.   Over 15 minutes of exam, counseling, chart review, and critical decision making was performed.   Future Appointments  Date Time Provider [place]  07/23/2019  9:30 AM Liane Comber, NP [facility] None  10/06/2019  9:00 AM Baird Lyons D, MD [facility] None  01/26/2020  2:00 PM Vicie Mutters, PA-C [facility] None    ------------------------------------------------------------------------------------------------------------------   HPI BP 134/86   Pulse 61   Temp (!) 97.3 F (36.3 C)   Wt 220 lb (99.8 kg)   SpO2 99%   BMI 33.45 kg/m   68 y.o.male with hx of htn presents for evaluation of left shin wound with concern for infection. Reports he was working in his yard 7 days ago (03/31/2019), scraped leg on outdoor trash can. He has been cleaning wound, monitoring and  applying neosporin, quite tender and throbbing though improves after he gets up and gets going. He denies fever/chills/discharge. Concerned due to not improving as quickly as he expected.   Last tetanus was in 2013.    Lab Results  Component Value Date   GFRNONAA 81 01/26/2019    His blood pressure has been controlled at home (reports recently 130s/low 80s), today their BP is initially 154/90,  BP: 134/86 per manual recheck by provider at end of the visit  He does workout. He denies chest pain, shortness of breath, dizziness.    Past Medical History:  Diagnosis Date  . Cardiomegaly   . Colon polyp   . Depression   . Fatty liver disease, nonalcoholic   . Gallstones   . Hypertension   . Hypogonadism male   . Mixed hyperlipidemia   . Obesity   . Pancreas (digestive gland) works poorly   . Prediabetes   . Sleep apnea   . Testosterone deficiency 08/03/2009  . Thyroid disease   . Vitamin D deficiency      Allergies  Allergen Reactions  . Ace Inhibitors Other (See Comments)    cough  . Bee Venom Other (See Comments)    Current Outpatient Medications on File Prior to Visit  Medication Sig  . Ascorbic Acid (VITAMIN C) 1000 MG tablet Take 1,000 mg by mouth daily.   Marland Kitchen aspirin 325 MG tablet Take 325 mg by mouth daily.    . B Complex Vitamins (B COMPLEX PO) Take 1 tablet by mouth See admin instructions. Takes M,W,F  .  buPROPion (WELLBUTRIN XL) 150 MG 24 hr tablet TAKE 1 TABLET BY MOUTH EVERY DAY IN THE MORNING  . EPINEPHrine (EPIPEN 2-PAK) 0.3 mg/0.3 mL IJ SOAJ injection Inject 0.3 mLs (0.3 mg total) into the muscle once.  . ezetimibe (ZETIA) 10 MG tablet Take 1 tablet (10 mg total) by mouth daily.  . fish oil-omega-3 fatty acids 1000 MG capsule Take 2 g by mouth 3 (three) times daily.   . Flaxseed, Linseed, (FLAX SEED OIL) 1000 MG CAPS Take 2,000 mg by mouth daily.  Marland Kitchen gemfibrozil (LOPID) 600 MG tablet Take 1 tablet 2 x /day for Cholesterol  . hydrochlorothiazide (HYDRODIURIL)  12.5 MG tablet Take 1 tablet (12.5 mg total) by mouth daily.  Marland Kitchen levothyroxine (SYNTHROID) 50 MCG tablet TAKE 1 TABLET BY MOUTH EVERY DAY  . MAGNESIUM PO Take 750 mg by mouth daily.   . metoprolol tartrate (LOPRESSOR) 25 MG tablet Take 1 tablet 2 x /day for BP  . Multiple Vitamin (MULTIVITAMIN) capsule Take by mouth.  . olmesartan (BENICAR) 40 MG tablet Take 1 tablet Daily for BP  . sertraline (ZOLOFT) 100 MG tablet Take 2 tablets Daily for Mood   No current facility-administered medications on file prior to visit.     ROS: all negative except above.   Physical Exam:  BP 134/86   Pulse 61   Temp (!) 97.3 F (36.3 C)   Wt 220 lb (99.8 kg)   SpO2 99%   BMI 33.45 kg/m   General Appearance: Well nourished, in no apparent distress. Eyes: conjunctiva no swelling or erythema ENT/Mouth: Hearing normal.  Neck: Supple Respiratory: Respiratory effort normal, BS equal bilaterally without rales, rhonchi, wheezing or stridor.  Cardio: RRR with no MRGs. Brisk peripheral pulses without edema.  Abdomen: Soft, + BS.   Lymphatics: Non tender without lymphadenopathy.  Musculoskeletal: No obvious deformity, no palpable bony abnormality or point tenderness, normal gait.  Skin: Warm, dry; he has superficial abrasion with open wound to left shin, approx 0.5 mm wide x 12 cm long with scant purulent discharge to open wound, puffy erythematous edges approx 0.5 cm surrounding Neuro: Cranial nerves intact. Normal muscle tone, no cerebellar symptoms. Sensation intact.  Psych: Awake and oriented X 3, normal affect, Insight and Judgment appropriate.     Izora Ribas, NP 4:11 PM [facility]

## 2019-04-10 DIAGNOSIS — G4733 Obstructive sleep apnea (adult) (pediatric): Secondary | ICD-10-CM | POA: Diagnosis not present

## 2019-04-10 DIAGNOSIS — R269 Unspecified abnormalities of gait and mobility: Secondary | ICD-10-CM | POA: Diagnosis not present

## 2019-05-10 DIAGNOSIS — G4733 Obstructive sleep apnea (adult) (pediatric): Secondary | ICD-10-CM | POA: Diagnosis not present

## 2019-05-10 DIAGNOSIS — R269 Unspecified abnormalities of gait and mobility: Secondary | ICD-10-CM | POA: Diagnosis not present

## 2019-05-11 ENCOUNTER — Other Ambulatory Visit: Payer: Self-pay | Admitting: Internal Medicine

## 2019-05-11 MED ORDER — DEXAMETHASONE 4 MG PO TABS: ORAL_TABLET | ORAL | 0 refills | Status: DC

## 2019-05-11 MED ORDER — AZITHROMYCIN 250 MG PO TABS: ORAL_TABLET | ORAL | 1 refills | Status: DC

## 2019-05-14 ENCOUNTER — Ambulatory Visit: Payer: PPO | Attending: Internal Medicine

## 2019-05-14 DIAGNOSIS — Z20828 Contact with and (suspected) exposure to other viral communicable diseases: Secondary | ICD-10-CM | POA: Diagnosis not present

## 2019-05-14 DIAGNOSIS — Z20822 Contact with and (suspected) exposure to covid-19: Secondary | ICD-10-CM

## 2019-05-15 LAB — NOVEL CORONAVIRUS, NAA: SARS-CoV-2, NAA: NOT DETECTED

## 2019-05-18 ENCOUNTER — Other Ambulatory Visit: Payer: Self-pay | Admitting: Internal Medicine

## 2019-06-10 DIAGNOSIS — R269 Unspecified abnormalities of gait and mobility: Secondary | ICD-10-CM | POA: Diagnosis not present

## 2019-06-10 DIAGNOSIS — G4733 Obstructive sleep apnea (adult) (pediatric): Secondary | ICD-10-CM | POA: Diagnosis not present

## 2019-07-11 DIAGNOSIS — R269 Unspecified abnormalities of gait and mobility: Secondary | ICD-10-CM | POA: Diagnosis not present

## 2019-07-11 DIAGNOSIS — G4733 Obstructive sleep apnea (adult) (pediatric): Secondary | ICD-10-CM | POA: Diagnosis not present

## 2019-07-15 NOTE — Progress Notes (Signed)
MEDICARE WELLNESS VISIT  Assessment and Plan:  Essential hypertension Continue medication; continue BB, olmesartanincrease hctz to 25 mg if consistently above goal  Follow up in 2 weeks Monitor blood pressure at home; call if consistently over 130/80 Continue DASH diet.   Reminder to go to the ER if any CP, SOB, nausea, dizziness, severe HA, changes vision/speech, left arm numbness and tingling and jaw pain. - CBC with Differential/Platelet - CMP/GFR   Hypothyroidism, unspecified hypothyroidism type Hypothyroidism-check TSH level, continue medications the same, reminded to take on an empty stomach 30-88mins before food.  - TSH  Prediabetes Discussed general issues about diabetes pathophysiology and management., Educational material distributed., Suggested low cholesterol diet., Encouraged aerobic exercise., Discussed foot care., Reminded to get yearly retinal exam. - Hemoglobin A1c   Hyperlipidemia -continue medications, check lipids, decrease fatty foods, increase activity.  - Lipid panel  Morbid Obesity (BMI 30+ with OSA and other co morbidities) - follow up 3 months for progress monitoring - increase veggies, decrease carbs - long discussion about weight loss, diet, and exercise - weight goal of <200 lb set  Prediabetes Discussed disease and risks Discussed diet/exercise, weight management  A1C   Vitamin D deficiency At goal at recent check; continue to recommend supplementation for goal of 70-100 Defer vitamin D level  Medication management - Magnesium  Left foot drop Monitor, improved with increased exercise follow up ortho PRN  myositis MONITOR  Obstructive sleep apnea Continue CPAP  Erectile dysfunction Previously on testosterone and cialis    Major depression in remission Continue zoloft, wellbutrin Lifestyle discussed: diet/exerise, sleep hygiene, stress management, hydration    Discussed med's effects and SE's. Screening labs and tests as  requested with regular follow-up as recommended. Over 40 minutes of exam, counseling, chart review and critical decision making was performed Future Appointments  Date Time Provider [place]  10/06/2019  9:00 AM Baird Lyons D, MD [facility] None  01/26/2020  2:00 PM Vicie Mutters, PA-C [facility] None    Plan:   During the course of the visit the patient was educated and counseled about appropriate screening and preventive services including:    Pneumococcal vaccine   Prevnar 13  Influenza vaccine  Td vaccine  Screening electrocardiogram  Bone densitometry screening  Colorectal cancer screening  Diabetes screening  Glaucoma screening  Nutrition counseling   Advanced directives: requested  _____________________________________________________________________________ HPI Patient presents for a medicare wellness visit and for 3 month follow up on htn, hyperlipidemia, prediabetes, hypothyroidism, obesity, vit D def.   He has sleep apnea with CPAP. He is [place] the [employer].   He has hx of miositis and L foot drop and follows with ortho PRN, declined surgery, [facility]   He has dx of depression in remission on zoloft 150 mg daily, and more recently added wellbutrin 150 mg, due to some depressed mood related to son moving.  BMI is Body mass index is 32.99 kg/m., he has been working on diet, admits exercises is limited since covid, [facility]. Has been trying to make better choices.  Wt Readings from Last 3 Encounters:  07/23/19 217 lb (98.4 kg)  04/07/19 220 lb (99.8 kg)  03/09/19 215 lb (97.5 kg)    He has not been checking BP at home, he is on metoprolol 25mg  BID, olmesartan 40 mg, HCTZ 12.5 mg, today their BP is BP: (!) 156/90  He has a history of LVH with normal EF, last echo 2011,  and normal cardiolite 2014. He does not workout.  He denies chest pain, shortness of breath, dizziness.   He is on  cholesterol medication and denies myalgias, had intolerant to statins due to elevated CPK so on zetia and lopid.  His cholesterol is not at goal. States has cut back significantly on cheese. The cholesterol last visit was:   Lab Results  Component Value Date   CHOL 185 01/26/2019   HDL 35 (L) 01/26/2019   LDLCALC 123 (H) 01/26/2019   TRIG 159 (H) 01/26/2019   CHOLHDL 5.3 (H) 01/26/2019   He has been working on diet and exercise for prediabetes,  and denies paresthesia of the feet, polydipsia, polyuria and visual disturbances. Last A1C in the office was:  Lab Results  Component Value Date   HGBA1C 5.7 (H) 01/26/2019   Patient is on Vitamin D supplement.   Lab Results  Component Value Date   VD25OH 78 01/26/2019    He is on thyroid medication. His medication was not changed last visit.   Lab Results  Component Value Date   TSH 1.18 01/26/2019    He follows with Dr. Suann Larry PRN, has had elevated PSA while on testosterone.  Lab Results  Component Value Date   PSA 0.7 01/20/2018    Current Medications:  Current Outpatient Medications on File Prior to Visit  Medication Sig Dispense Refill  . Ascorbic Acid (VITAMIN C) 1000 MG tablet Take 1,000 mg by mouth daily.     Marland Kitchen aspirin 325 MG tablet Take 325 mg by mouth daily.      . B Complex Vitamins (B COMPLEX PO) Take 1 tablet by mouth See admin instructions. Takes M,W,F    . buPROPion (WELLBUTRIN XL) 150 MG 24 hr tablet TAKE 1 TABLET BY MOUTH EVERY DAY IN THE MORNING 90 tablet 3  . EPINEPHrine (EPIPEN 2-PAK) 0.3 mg/0.3 mL IJ SOAJ injection Inject 0.3 mLs (0.3 mg total) into the muscle once. 2 Device 0  . ezetimibe (ZETIA) 10 MG tablet Take 1 tablet Daily for Cholesterol 90 tablet 3  . fish oil-omega-3 fatty acids 1000 MG capsule Take 2 g by mouth 3 (three) times daily.     . Flaxseed, Linseed, (FLAX SEED OIL) 1000 MG CAPS Take 2,000 mg by mouth daily.    Marland Kitchen gemfibrozil (LOPID) 600 MG tablet Take 1 tablet 2 x /day for Cholesterol 180  tablet 3  . hydrochlorothiazide (HYDRODIURIL) 12.5 MG tablet Take 1 tablet (12.5 mg total) by mouth daily. 90 tablet 1  . levothyroxine (SYNTHROID) 50 MCG tablet TAKE 1 TABLET BY MOUTH EVERY DAY 90 tablet 3  . MAGNESIUM PO Take 750 mg by mouth daily.     . metoprolol tartrate (LOPRESSOR) 25 MG tablet Take 1 tablet 2 x /day for BP 180 tablet 3  . Multiple Vitamin (MULTIVITAMIN) capsule Take by mouth.    . olmesartan (BENICAR) 40 MG tablet Take 1 tablet Daily for BP 90 tablet 3  . sertraline (ZOLOFT) 100 MG tablet Take 2 tablets Daily for Mood (Patient taking differently: Take 1 tablet Daily for M ood) 180 tablet 1   No current facility-administered medications on file prior to visit.   Health Maintenance:  Immunization History  Administered Date(s) Administered  . Influenza Split 03/06/2012, 02/24/2013, 03/18/2014  . Influenza, High Dose Seasonal PF 02/12/2016, 04/09/2017, 02/15/2019  . Influenza-Unspecified 03/03/2015, 02/04/2018  . PPD Test 07/26/2013  . Pneumococcal Conjugate-13 02/12/2016  . Pneumococcal Polysaccharide-23 07/26/2013, 01/26/2019  . Pneumococcal-Unspecified 05/04/2004  . Td 04/07/2019  .  Tdap 07/24/2011  . Zoster 12/02/2013  . Zoster Recombinat (Shingrix) 01/28/2019, 04/20/2019   Influenza 01/2019 TDAP 2013 Pneumovax 2015 Prevnar: 2017 Shingles vaccine: 2015, 2020 Covid 19 1/2  Colonoscopy 03/14/2017 - 5 year follow up   ECHO 08/06/09 Neg cardiolite 2014 CXR 06/30/12 WNL U/S ABD 08/01/11- FATTY LIVER   EYE Exam: Dr. Quay Burow, [facility] 2020 Dental exam: Dr. Conley Canal, last 2021  Patient Care Team: Unk Pinto, MD as PCP - General (Internal Medicine) Rana Snare, MD as Consulting Physician (Urology) Irene Shipper, MD as Consulting Physician (Gastroenterology) Melvyn Novas, MD as Referring Physician (Neurology) Lavonna Monarch, MD as Consulting Physician (Dermatology) Dyke Maes, OD (Optometry) Josue Hector, MD as Consulting Physician  (Cardiology)  Medical History:  Past Medical History:  Diagnosis Date  . Cardiomegaly   . Colon polyp   . Depression   . Fatty liver disease, nonalcoholic   . Gallstones   . Hypertension   . Hypogonadism male   . Mixed hyperlipidemia   . Obesity   . Pancreas (digestive gland) works poorly   . Prediabetes   . Sleep apnea   . Testosterone deficiency 08/03/2009  . Thyroid disease   . Vitamin D deficiency    Allergies Allergies  Allergen Reactions  . Ace Inhibitors Other (See Comments)    cough  . Bee Venom Other (See Comments)    SURGICAL HISTORY He  has a past surgical history that includes Cholecystectomy (1976); Appendectomy (1964); Ankle fracture surgery (Left, 1986); Carpal tunnel release (Bilateral, 1996); Tibia IM nail insertion (Right, 03/21/2016); Colonoscopy; and Polypectomy. FAMILY HISTORY His family history includes Diabetes in his father, mother, and sister; Heart disease in his father; Hypertension in his father. SOCIAL HISTORY He  reports that he quit smoking about 45 years ago. His smoking use included cigarettes. He has a 14.00 pack-year smoking history. He has never used smokeless tobacco. He reports that he does not drink alcohol or use drugs.   MEDICARE WELLNESS OBJECTIVES: Physical activity:   Cardiac risk factors:   Depression/mood screen:   Depression screen [facility] 2/9 07/14/2018  Decreased Interest 0  Down, Depressed, Hopeless 0  PHQ - 2 Score 0    ADLs:  No flowsheet data found.   Cognitive Testing  Alert? Yes  Normal Appearance?Yes  Oriented to person? Yes  Place? Yes   Time? Yes  Recall of three objects?  Yes  Can perform simple calculations? Yes  Displays appropriate judgment?Yes  Can read the correct time from a watch face?Yes  EOL planning: Does Patient Have a Medical Advance Directive?: No Would patient like information on creating a medical advance directive?: No - Patient declined  Review of Systems:  Review of Systems   Constitutional: Negative.  Negative for malaise/fatigue and weight loss.  HENT: Negative.  Negative for hearing loss and tinnitus.   Eyes: Negative.  Negative for blurred vision and double vision.  Respiratory: Negative.  Negative for cough, sputum production, shortness of breath and wheezing.   Cardiovascular: Negative.  Negative for chest pain, palpitations, orthopnea, claudication, leg swelling and PND.  Gastrointestinal: Negative.  Negative for abdominal pain, blood in stool, constipation, diarrhea, heartburn, melena, nausea and vomiting.  Genitourinary: Negative.   Musculoskeletal: Negative.  Negative for falls, joint pain and myalgias.  Skin: Negative.  Negative for rash.  Neurological: Negative.  Negative for dizziness, tingling, sensory change, weakness and headaches.  Endo/Heme/Allergies: Negative.  Negative for polydipsia.  Psychiatric/Behavioral: Negative.  Negative for depression, memory loss, substance abuse and suicidal ideas.  The patient is not nervous/anxious and does not have insomnia.   All other systems reviewed and are negative.   Physical Exam: Estimated body mass index is 32.99 kg/m as calculated from the following:   Height as of 01/26/19: 5\' 8"  (1.727 m).   Weight as of this encounter: 217 lb (98.4 kg). BP (!) 156/90   Pulse 60   Temp (!) 96.8 F (36 C)   Wt 217 lb (98.4 kg)   SpO2 97%   BMI 32.99 kg/m  General Appearance: Well nourished, in no apparent distress.  Eyes: PERRLA, EOMs, conjunctiva no swelling or erythema, normal fundi and vessels.  Sinuses: No Frontal/maxillary tenderness  ENT/Mouth: Ext aud canals impacted bilaterally with cerumen, normal light reflex with TMs without erythema, bulging. Good dentition. No erythema, swelling, or exudate on post pharynx. Tonsils not swollen or erythematous. Hearing normal.  Neck: Supple, thyroid normal. No bruits  Respiratory: Respiratory effort normal, BS equal bilaterally without rales, rhonchi, wheezing or  stridor.  Cardio: RRR without murmurs, rubs or gallops. Brisk peripheral pulses without edema.  Chest: symmetric, with normal excursions and percussion.  Abdomen: Soft, obese, nontender, with large vertical and right horizontal scar no guarding, rebound, hernias, masses, or organomegaly.  Lymphatics: Non tender without lymphadenopathy.  Genitourinary: defer Musculoskeletal: Full ROM all peripheral extremities,5/5 strength, and normal gait.  Skin:   Warm, dry without rashes, lesions, ecchymosis. Neuro: Cranial nerves intact, reflexes decreased bilateral legs, worse left than right with decrease dorsiflexion/plantar on left. Normal muscle tone, no cerebellar symptoms. Sensation intact.  Psych: Awake and oriented X 3, normal affect, Insight and Judgment appropriate.    Medicare Attestation I have personally reviewed: The patient's medical and social history Their use of alcohol, tobacco or illicit drugs Their current medications and supplements The patient's functional ability including ADLs,fall risks, home safety risks, cognitive, and hearing and visual impairment Diet and physical activities Evidence for depression or mood disorders  The patient's weight, height, BMI, and visual acuity have been recorded in the chart.  I have made referrals, counseling, and provided education to the patient based on review of the above and I have provided the patient with a written personalized care plan for preventive services.     [name] [name] 9:47 AM [facility]

## 2019-07-23 ENCOUNTER — Ambulatory Visit (INDEPENDENT_AMBULATORY_CARE_PROVIDER_SITE_OTHER): Payer: PPO | Admitting: Adult Health

## 2019-07-23 ENCOUNTER — Encounter: Payer: Self-pay | Admitting: Adult Health

## 2019-07-23 ENCOUNTER — Other Ambulatory Visit: Payer: Self-pay

## 2019-07-23 VITALS — BP 156/88 | HR 60 | Temp 96.8°F | Wt 217.0 lb

## 2019-07-23 DIAGNOSIS — K760 Fatty (change of) liver, not elsewhere classified: Secondary | ICD-10-CM

## 2019-07-23 DIAGNOSIS — E782 Mixed hyperlipidemia: Secondary | ICD-10-CM

## 2019-07-23 DIAGNOSIS — E559 Vitamin D deficiency, unspecified: Secondary | ICD-10-CM | POA: Diagnosis not present

## 2019-07-23 DIAGNOSIS — Z0000 Encounter for general adult medical examination without abnormal findings: Secondary | ICD-10-CM

## 2019-07-23 DIAGNOSIS — Z0001 Encounter for general adult medical examination with abnormal findings: Secondary | ICD-10-CM | POA: Diagnosis not present

## 2019-07-23 DIAGNOSIS — Z79899 Other long term (current) drug therapy: Secondary | ICD-10-CM

## 2019-07-23 DIAGNOSIS — R6889 Other general symptoms and signs: Secondary | ICD-10-CM | POA: Diagnosis not present

## 2019-07-23 DIAGNOSIS — N529 Male erectile dysfunction, unspecified: Secondary | ICD-10-CM | POA: Diagnosis not present

## 2019-07-23 DIAGNOSIS — E039 Hypothyroidism, unspecified: Secondary | ICD-10-CM

## 2019-07-23 DIAGNOSIS — M21372 Foot drop, left foot: Secondary | ICD-10-CM | POA: Diagnosis not present

## 2019-07-23 DIAGNOSIS — M609 Myositis, unspecified: Secondary | ICD-10-CM

## 2019-07-23 DIAGNOSIS — G4733 Obstructive sleep apnea (adult) (pediatric): Secondary | ICD-10-CM | POA: Diagnosis not present

## 2019-07-23 DIAGNOSIS — F325 Major depressive disorder, single episode, in full remission: Secondary | ICD-10-CM

## 2019-07-23 DIAGNOSIS — R7309 Other abnormal glucose: Secondary | ICD-10-CM | POA: Diagnosis not present

## 2019-07-23 DIAGNOSIS — I10 Essential (primary) hypertension: Secondary | ICD-10-CM | POA: Diagnosis not present

## 2019-07-23 NOTE — Patient Instructions (Addendum)
[name] , Thank you for taking time to come for your Medicare Wellness Visit. I appreciate your ongoing commitment to your health goals. Please review the following plan we discussed and let me know if I can assist you in the future.   These are the goals we discussed: Goals    . Blood Pressure < 130/80    . Exercise 150 min/wk Moderate Activity    . Weight (lb) < 200 lb (90.7 kg)       This is a list of the screening recommended for you and due dates:  Health Maintenance  Topic Date Due  . Colon Cancer Screening  03/14/2022  . Tetanus Vaccine  04/06/2029  . Flu Shot  Completed  .  Hepatitis C: One time screening is recommended by Center for Disease Control  (CDC) for  adults born from [birth year] through 1965.   Completed  . Pneumonia vaccines  Completed    Please check your blood pressure twice a day, keep a log and bring with you next visit  Bring cuff as well   If persistently above 140/80, please increase hydrochlorothiazide to 25 mg (2 tabs) after 1 week    HYPERTENSION INFORMATION  Monitor your blood pressure at home, please keep a record and bring that in with you to your next office visit.   Go to the ER if any CP, SOB, nausea, dizziness, severe HA, changes vision/speech  Testing/Procedures: HOW TO TAKE YOUR BLOOD PRESSURE:  Rest 5 minutes before taking your blood pressure.  Don't smoke or drink caffeinated beverages for at least 30 minutes before.  Take your blood pressure before (not after) you eat.  Sit comfortably with your back supported and both feet on the floor (don't cross your legs).  Elevate your arm to heart level on a table or a desk.  Use the proper sized cuff. It should fit smoothly and snugly around your bare upper arm. There should be enough room to slip a fingertip under the cuff. The bottom edge of the cuff should be 1 inch above the crease of the elbow.   Your most recent BP: BP: (!) 156/88   Take your medications faithfully as  instructed. Maintain a healthy weight. Get at least 150 minutes of aerobic exercise per week. Minimize salt intake. Minimize alcohol intake  DASH Eating Plan DASH stands for "Dietary Approaches to Stop Hypertension." The DASH eating plan is a healthy eating plan that has been shown to reduce high blood pressure (hypertension). Additional health benefits may include reducing the risk of type 2 diabetes mellitus, heart disease, and stroke. The DASH eating plan may also help with weight loss. WHAT DO I NEED TO KNOW ABOUT THE DASH EATING PLAN? For the DASH eating plan, you will follow these general guidelines:  Choose foods with a percent daily value for sodium of less than 5% (as listed on the food label).  Use salt-free seasonings or herbs instead of table salt or sea salt.  Check with your health care provider or pharmacist before using salt substitutes.  Eat lower-sodium products, often labeled as "lower sodium" or "no salt added."  Eat fresh foods.  Eat more vegetables, fruits, and low-fat dairy products.  Choose whole grains. Look for the word "whole" as the first word in the ingredient list.  Choose fish and skinless chicken or [place] more often than red meat. Limit fish, poultry, and meat to 6 oz (170 g) each day.  Limit sweets, desserts, sugars, and sugary drinks.  Choose heart-healthy fats.  Limit cheese to 1 oz (28 g) per day.  Eat more home-cooked food and less restaurant, buffet, and fast food.  Limit fried foods.  Cook foods using methods other than frying.  Limit canned vegetables. If you do use them, rinse them well to decrease the sodium.  When eating at a restaurant, ask that your food be prepared with less salt, or no salt if possible. WHAT FOODS CAN I EAT? Seek help from a dietitian for individual calorie needs. Grains Whole grain or whole wheat bread. Brown rice. Whole grain or whole wheat pasta. Quinoa, bulgur, and whole grain cereals. Low-sodium  cereals. Corn or whole wheat flour tortillas. Whole grain cornbread. Whole grain crackers. Low-sodium crackers. Vegetables Fresh or frozen vegetables (raw, steamed, roasted, or grilled). Low-sodium or reduced-sodium tomato and vegetable juices. Low-sodium or reduced-sodium tomato sauce and paste. Low-sodium or reduced-sodium canned vegetables.  Fruits All fresh, canned (in natural juice), or frozen fruits. Meat and Other Protein Products Ground beef (85% or leaner), grass-fed beef, or beef trimmed of fat. Skinless chicken or [place]. Ground chicken or [place]. Pork trimmed of fat. All fish and seafood. Eggs. Dried beans, peas, or lentils. Unsalted nuts and seeds. Unsalted canned beans. Dairy Low-fat dairy products, such as skim or 1% milk, 2% or reduced-fat cheeses, low-fat ricotta or cottage cheese, or plain low-fat yogurt. Low-sodium or reduced-sodium cheeses. Fats and Oils Tub margarines without trans fats. Light or reduced-fat mayonnaise and salad dressings (reduced sodium). Avocado. Safflower, olive, or canola oils. Natural peanut or almond butter. Other Unsalted popcorn and pretzels. The items listed above may not be a complete list of recommended foods or beverages. Contact your dietitian for more options. WHAT FOODS ARE NOT RECOMMENDED? Grains White bread. White pasta. White rice. Refined cornbread. Bagels and croissants. Crackers that contain trans fat. Vegetables Creamed or fried vegetables. Vegetables in a cheese sauce. Regular canned vegetables. Regular canned tomato sauce and paste. Regular tomato and vegetable juices. Fruits Dried fruits. Canned fruit in light or heavy syrup. Fruit juice. Meat and Other Protein Products Fatty cuts of meat. Ribs, chicken wings, bacon, sausage, bologna, salami, chitterlings, fatback, hot dogs, bratwurst, and packaged luncheon meats. Salted nuts and seeds. Canned beans with salt. Dairy Whole or 2% milk, cream, half-and-half, and cream cheese.  Whole-fat or sweetened yogurt. Full-fat cheeses or blue cheese. Nondairy creamers and whipped toppings. Processed cheese, cheese spreads, or cheese curds. Condiments Onion and garlic salt, seasoned salt, table salt, and sea salt. Canned and packaged gravies. Worcestershire sauce. Tartar sauce. Barbecue sauce. Teriyaki sauce. Soy sauce, including reduced sodium. Steak sauce. Fish sauce. Oyster sauce. Cocktail sauce. Horseradish. Ketchup and mustard. Meat flavorings and tenderizers. Bouillon cubes. Hot sauce. Tabasco sauce. Marinades. Taco seasonings. Relishes. Fats and Oils Butter, stick margarine, lard, shortening, ghee, and bacon fat. Coconut, palm kernel, or palm oils. Regular salad dressings. Other Pickles and olives. Salted popcorn and pretzels. The items listed above may not be a complete list of foods and beverages to avoid. Contact your dietitian for more information. WHERE CAN I FIND MORE INFORMATION? National Heart, Lung, and Blood Institute: travelstabloid.com Document Released: 05/02/2011 Document Revised: 09/27/2013 Document Reviewed: 03/17/2013 [facility] Patient Information 2015 [place], [place]. This information is not intended to replace advice given to you by your health care provider. Make sure you discuss any questions you have with your health care provider.

## 2019-07-24 ENCOUNTER — Other Ambulatory Visit: Payer: Self-pay | Admitting: Adult Health

## 2019-07-24 ENCOUNTER — Encounter: Payer: Self-pay | Admitting: Adult Health

## 2019-07-24 LAB — CBC WITH DIFFERENTIAL/PLATELET
Absolute Monocytes: 585 cells/uL (ref 200–950)
Basophils Absolute: 38 cells/uL (ref 0–200)
Basophils Relative: 1 %
Eosinophils Absolute: 171 cells/uL (ref 15–500)
Eosinophils Relative: 4.5 %
HCT: 46.5 % (ref 38.5–50.0)
Hemoglobin: 15.9 g/dL (ref 13.2–17.1)
Lymphs Abs: 1125 cells/uL (ref 850–3900)
MCH: 30.8 pg (ref 27.0–33.0)
MCHC: 34.2 g/dL (ref 32.0–36.0)
MCV: 90.1 fL (ref 80.0–100.0)
MPV: 10.4 fL (ref 7.5–12.5)
Monocytes Relative: 15.4 %
Neutro Abs: 1881 cells/uL (ref 1500–7800)
Neutrophils Relative %: 49.5 %
Platelets: 292 10*3/uL (ref 140–400)
RBC: 5.16 10*6/uL (ref 4.20–5.80)
RDW: 13.7 % (ref 11.0–15.0)
Total Lymphocyte: 29.6 %
WBC: 3.8 10*3/uL (ref 3.8–10.8)

## 2019-07-24 LAB — COMPLETE METABOLIC PANEL WITH GFR
AG Ratio: 2 (calc) (ref 1.0–2.5)
ALT: 36 U/L (ref 9–46)
AST: 36 U/L — ABNORMAL HIGH (ref 10–35)
Albumin: 4.9 g/dL (ref 3.6–5.1)
Alkaline phosphatase (APISO): 51 U/L (ref 35–144)
BUN: 22 mg/dL (ref 7–25)
CO2: 30 mmol/L (ref 20–32)
Calcium: 10.8 mg/dL — ABNORMAL HIGH (ref 8.6–10.3)
Chloride: 103 mmol/L (ref 98–110)
Creat: 0.98 mg/dL (ref 0.70–1.25)
GFR, Est African American: 91 mL/min/{1.73_m2} (ref >=60)
GFR, Est Non African American: 79 mL/min/{1.73_m2} (ref >=60)
Globulin: 2.4 g/dL (calc) (ref 1.9–3.7)
Glucose, Bld: 102 mg/dL — ABNORMAL HIGH (ref 65–99)
Potassium: 4.6 mmol/L (ref 3.5–5.3)
Sodium: 141 mmol/L (ref 135–146)
Total Bilirubin: 0.5 mg/dL (ref 0.2–1.2)
Total Protein: 7.3 g/dL (ref 6.1–8.1)

## 2019-07-24 LAB — TSH: TSH: 0.91 mIU/L (ref 0.40–4.50)

## 2019-07-24 LAB — HEMOGLOBIN A1C
Hgb A1c MFr Bld: 5.8 % of total Hgb — ABNORMAL HIGH (ref <5.7)
Mean Plasma Glucose: 120 (calc)
eAG (mmol/L): 6.6 (calc)

## 2019-07-24 LAB — LIPID PANEL
Cholesterol: 184 mg/dL (ref <200)
HDL: 38 mg/dL — ABNORMAL LOW (ref >=40)
LDL Cholesterol (Calc): 110 mg/dL (calc) — ABNORMAL HIGH
Non-HDL Cholesterol (Calc): 146 mg/dL (calc) — ABNORMAL HIGH (ref <130)
Total CHOL/HDL Ratio: 4.8 (calc) (ref <5.0)
Triglycerides: 253 mg/dL — ABNORMAL HIGH (ref <150)

## 2019-07-24 LAB — MAGNESIUM: Magnesium: 2.3 mg/dL (ref 1.5–2.5)

## 2019-08-05 NOTE — Progress Notes (Signed)
Assessment and Plan:  Demetria was seen today for follow-up.  Diagnoses and all orders for this visit:  Essential hypertension Well controlled at recheck and home log; Continue medications Monitor blood pressure at home; call if consistently over 130/80 Continue DASH diet.   Reminder to go to the ER if any CP, SOB, nausea, dizziness, severe HA, changes vision/speech, left arm numbness and tingling and jaw pain.  Hypercalcemia -     Parathyroid Hormone, Intact w/Ca  Further disposition pending results of labs. Discussed med's effects and SE's.   Over 15 minutes of exam, counseling, chart review, and critical decision making was performed.   Future Appointments  Date Time Provider [place]  10/06/2019  9:00 AM Baird Lyons D, MD [facility] None  01/26/2020  2:00 PM Vicie Mutters, PA-C [id number] None    ------------------------------------------------------------------------------------------------------------------   HPI BP 118/82   Pulse 62   Temp (!) 97.2 F (36.2 C)   Wt 217 lb (98.4 kg)   SpO2 98%   BMI 32.99 kg/m   69 y.o.male presents for 2 week follow up on BP and also for hypercalcemia lab follow up.   His blood pressure has been controlled at home (ranges 115/76 to low 130s/80s, only 1 value elevated was 141/97), taking HCTZ 12.5 mg AM, lopressor 25 mg BID, olmesartan 40 mg, today their BP is BP: 118/82  He does workout. He denies chest pain, shortness of breath, dizziness.  Calcium has been elevated at last 2 checks; he denies notable tums use, notable changes in diet, increased dairy, changes in vitamin D supplement dose.  Component     Latest Ref Rng & Units 01/26/2019 07/23/2019  Calcium     8.6 - 10.3 mg/dL 10.7 (H) 10.8 (H)    Past Medical History:  Diagnosis Date  . Cardiomegaly   . Colon polyp   . Depression   . Fatty liver disease, nonalcoholic   . Gallstones   . Hypertension   . Hypogonadism male   . Mixed hyperlipidemia   . Obesity    . Pancreas (digestive gland) works poorly   . Prediabetes   . Sleep apnea   . Testosterone deficiency 08/03/2009  . Thyroid disease   . Vitamin D deficiency      Allergies  Allergen Reactions  . Ace Inhibitors Other (See Comments)    cough  . Bee Venom Other (See Comments)    Current Outpatient Medications on File Prior to Visit  Medication Sig  . Ascorbic Acid (VITAMIN C) 1000 MG tablet Take 1,000 mg by mouth daily.   Marland Kitchen aspirin 325 MG tablet Take 325 mg by mouth daily.    . B Complex Vitamins (B COMPLEX PO) Take 1 tablet by mouth See admin instructions. Takes M,W,F  . buPROPion (WELLBUTRIN XL) 150 MG 24 hr tablet TAKE 1 TABLET BY MOUTH EVERY DAY IN THE MORNING  . EPINEPHrine (EPIPEN 2-PAK) 0.3 mg/0.3 mL IJ SOAJ injection Inject 0.3 mLs (0.3 mg total) into the muscle once.  . ezetimibe (ZETIA) 10 MG tablet Take 1 tablet Daily for Cholesterol  . fish oil-omega-3 fatty acids 1000 MG capsule Take 2 g by mouth 3 (three) times daily.   . Flaxseed, Linseed, (FLAX SEED OIL) 1000 MG CAPS Take 2,000 mg by mouth daily.  Marland Kitchen gemfibrozil (LOPID) 600 MG tablet Take 1 tablet 2 x /day for Cholesterol  . hydrochlorothiazide (HYDRODIURIL) 12.5 MG tablet Take 1 tablet (12.5 mg total) by mouth daily.  Marland Kitchen levothyroxine (SYNTHROID) 50 MCG tablet TAKE  1 TABLET BY MOUTH EVERY DAY  . MAGNESIUM PO Take 750 mg by mouth daily.   . metoprolol tartrate (LOPRESSOR) 25 MG tablet Take 1 tablet 2 x /day for BP  . Multiple Vitamin (MULTIVITAMIN) capsule Take by mouth.  . olmesartan (BENICAR) 40 MG tablet Take 1 tablet Daily for BP  . sertraline (ZOLOFT) 100 MG tablet Take 2 tablets Daily for Mood (Patient taking differently: Take 1 tablet Daily for M ood)   No current facility-administered medications on file prior to visit.    ROS: all negative except above.   Physical Exam:  BP 118/82   Pulse 62   Temp (!) 97.2 F (36.2 C)   Wt 217 lb (98.4 kg)   SpO2 98%   BMI 32.99 kg/m   General Appearance: Well  nourished, in no apparent distress. Eyes: PERRLA, EOMs, conjunctiva no swelling or erythema Sinuses: No Frontal/maxillary tenderness ENT/Mouth: Ext aud canals clear, TMs without erythema, bulging. No erythema, swelling, or exudate on post pharynx.  Tonsils not swollen or erythematous. Hearing normal.  Neck: Supple, thyroid normal.  Respiratory: Respiratory effort normal, BS equal bilaterally without rales, rhonchi, wheezing or stridor.  Cardio: RRR with no MRGs. Brisk peripheral pulses without edema.  Abdomen: Soft, + BS.  Non tender, no guarding, rebound, hernias, masses. Lymphatics: Non tender without lymphadenopathy.  Musculoskeletal: Full ROM, 5/5 strength, normal gait.  Skin: Warm, dry without rashes, lesions, ecchymosis.  Neuro: Cranial nerves intact. Normal muscle tone, no cerebellar symptoms. Sensation intact.  Psych: Awake and oriented X 3, normal affect, Insight and Judgment appropriate.     Izora Ribas, NP 8:45 AM [facility]

## 2019-08-06 ENCOUNTER — Encounter: Payer: Self-pay | Admitting: Adult Health

## 2019-08-06 ENCOUNTER — Other Ambulatory Visit: Payer: Self-pay

## 2019-08-06 ENCOUNTER — Ambulatory Visit (INDEPENDENT_AMBULATORY_CARE_PROVIDER_SITE_OTHER): Payer: PPO | Admitting: Adult Health

## 2019-08-06 VITALS — BP 118/82 | HR 62 | Temp 97.2°F | Wt 217.0 lb

## 2019-08-06 DIAGNOSIS — I10 Essential (primary) hypertension: Secondary | ICD-10-CM | POA: Diagnosis not present

## 2019-08-06 NOTE — Patient Instructions (Addendum)
Goals    . Blood Pressure < 130/80    . Exercise 150 min/wk Moderate Activity    . Weight (lb) < 200 lb (90.7 kg)       Call if blood pressure are consistently above 130/80  Can increase hydrochlorothiazide from 12.5 mg to 25 mg if needed for high blood pressures  Can stop either the fish oil or flax seed oil supplement - essentially the same thing     Hypercalcemia Hypercalcemia is when the level of calcium in a person's blood is above normal. The body needs calcium to make bones and keep them strong. Calcium also helps the muscles, nerves, brain, and heart work the way they should. Most of the calcium in the body is in the bones. There is also some calcium in the blood. Hypercalcemia can happen when calcium comes out of the bones, or when the kidneys are not able to remove calcium from the blood. Hypercalcemia can be mild or severe. What are the causes? There are many possible causes of hypercalcemia. Common causes of this condition include:  Hyperparathyroidism. This is a condition in which the body produces too much parathyroid hormone. There are four parathyroid glands in your neck. These glands produce a chemical messenger (hormone) that helps the body absorb calcium from foods and helps your bones release calcium.  Certain kinds of cancer. Less common causes of hypercalcemia include:  Getting too much calcium or vitamin D from your diet.  Kidney failure.  Hyperthyroidism.  Severe dehydration.  Being on bed rest or being inactive for a long time.  Certain medicines.  Infections. What increases the risk? You are more likely to develop this condition if you:  Are male.  Are 41 years of age or older.  Have a family history of hypercalcemia. What are the signs or symptoms? Mild hypercalcemia that starts slowly may not cause symptoms. Severe, sudden hypercalcemia is more likely to cause symptoms, such as:  Being more thirsty than usual.  Needing to urinate more  often than usual.  Abdominal pain.  Nausea and vomiting.  Constipation.  Muscle pain, twitching, or weakness.  Feeling very tired. How is this diagnosed?  Hypercalcemia is usually diagnosed with a blood test. You may also have tests to help determine what is causing this condition, such as imaging tests and more blood tests. How is this treated? Treatment for hypercalcemia depends on the cause. Treatment may include:  Receiving fluids through an IV.  Medicines that: ? Keep calcium levels steady after receiving fluids (loop diuretics). ? Keep calcium in your bones (bisphosphonates). ? Lower the calcium level in your blood.  Surgery to remove overactive parathyroid glands.  A procedure that filters your blood to correct calcium levels (hemodialysis). Follow these instructions at home:   Take over-the-counter and prescription medicines only as told by your health care provider.  Follow instructions from your health care provider about eating or drinking restrictions.  Drink enough fluid to keep your urine pale yellow.  Stay active. Weight-bearing exercise helps to keep calcium in your bones. Follow instructions from your health care provider about what type and level of exercise is safe for you.  Keep all follow-up visits as told by your health care provider. This is important. Contact a health care provider if you have:  A fever.  A heartbeat that is irregular or very fast.  Changes in mood, memory, or personality. Get help right away if you:  Have severe abdominal pain.  Have chest pain.  Have trouble breathing.  Become very confused and sleepy.  Lose consciousness. Summary  Hypercalcemia is when the level of calcium in a person's blood is above normal. The body needs calcium to make bones and keep them strong. Calcium also helps the muscles, nerves, brain, and heart work the way they should.  There are many possible causes of hypercalcemia, and treatment  depends on the cause.  Take over-the-counter and prescription medicines only as told by your health care provider.  Follow instructions from your health care provider about eating or drinking restrictions. This information is not intended to replace advice given to you by your health care provider. Make sure you discuss any questions you have with your health care provider. Document Revised: 06/09/2018 Document Reviewed: 02/16/2018 Elsevier Patient Education  2020 [employer].

## 2019-08-09 LAB — PTH, INTACT AND CALCIUM
Calcium: 10 mg/dL (ref 8.6–10.3)
PTH: 23 pg/mL (ref 14–64)

## 2019-09-12 ENCOUNTER — Other Ambulatory Visit: Payer: Self-pay | Admitting: Internal Medicine

## 2019-10-05 ENCOUNTER — Encounter: Payer: Self-pay | Admitting: Internal Medicine

## 2019-10-06 ENCOUNTER — Encounter: Payer: Self-pay | Admitting: Internal Medicine

## 2019-10-06 ENCOUNTER — Other Ambulatory Visit: Payer: Self-pay

## 2019-10-06 ENCOUNTER — Ambulatory Visit: Payer: PPO | Admitting: Internal Medicine

## 2019-10-06 VITALS — BP 118/80 | HR 60 | Temp 97.5°F | Ht 68.0 in | Wt 220.0 lb

## 2019-10-06 DIAGNOSIS — G4733 Obstructive sleep apnea (adult) (pediatric): Secondary | ICD-10-CM

## 2019-10-06 NOTE — Patient Instructions (Addendum)
We can continue CPAP auto 5-15, mask of choice humidifier, supplies, Airview/ card  Please call if we can help   Print script    CPAP mask of choice, straps, hoses, filters, supplies  Dx OSA     You can use this through [email or web address] or other sources as needed

## 2019-10-06 NOTE — Assessment & Plan Note (Signed)
Weight has varied between about 217 and 238 last few years.  Encouraged to keep it down.

## 2019-10-06 NOTE — Progress Notes (Signed)
HPI Male former smoker followed for OSA complicated by obesity, HBP NPSG 08/25/95- Severe obstructive sleep apnea, AHI 58/hr. ---------------------------------------------------------------------------  10/06/2018- 69 year old male former smoker followed for OSA, complicated by obesity, HBP, hypothyroid,  CPAP 13/Advanced>> today auto 5-15/ [employer] Download compliance 100%, AHI 2.8/hr -----OSA on CPAP, states no issues w/ mask (reports recieving new one recently) or pressure setting; states he's using it every night & with naps Body weight today 227 lbs He reports doing very well with no acute episodes or concerns.  He is comfortable with his CPAP.  Full beard does not interfere with mask.  He agrees it is time to replace this machine when eligible  10/06/19- 69 year old male former smoker followed for OSA, complicated by obesity, HBP, hypothyroid,  CPAP auto 5-15/ [employer] Download compliance 100%, AHI 4.1/ hr Body weight today 220 lbs Had 2 Phizer Covax He is pretty sure he and wife had Covid infection last winter. She later tested positive, he did not, but both sick at same time. Now fully resolved.  Doing well with CPAP, but not happy with [employer] billing so he gets his supplies on-line.   ROS- see HPI + = positive Constitutional:   No-   weight loss, night sweats, fevers, chills, fatigue, lassitude. HEENT:   No-  headaches, difficulty swallowing, tooth/dental problems, sore throat,       No-  sneezing, itching, ear ache, nasal congestion, post nasal drip,  CV:  No-   chest pain, orthopnea, PND, swelling in lower extremities, anasarca, dizziness, palpitations Resp: No-   shortness of breath with exertion or at rest.              No-   productive cough,  No non-productive cough,  No- coughing up of blood.              No-   change in color of mucus.  No- wheezing.   Skin: No-   rash or lesions. GI:  No-   heartburn, indigestion, abdominal pain, nausea, vomiting,  GU: . MS:  No-   joint pain  or swelling.  Neuro-     nothing unusual Psych:  No- change in mood or affect. No depression or anxiety.  No memory loss.  OBJ General- Alert, Oriented, Affect-appropriate, Distress- none acute; +overweight. + Full beard Skin- rash-none, lesions- none, excoriation- none Lymphadenopathy- none Head- atraumatic            Eyes- Gross vision intact, PERRLA, conjunctivae clear secretions            Ears- Hearing, canals-normal            Nose- Clear, no-Septal dev, mucus, polyps, erosion, perforation             Throat- Mallampati III-IV , mucosa , drainage- none, tonsils- atrophic Neck- flexible , trachea midline, no stridor , thyroid nl, carotid no bruit Chest - symmetrical excursion , unlabored           Heart/CV- RRR , no murmur , no gallop  , no rub, nl s1 s2                           - JVD- none , edema- none, stasis changes- none, varices- none           Lung- clear to P&A, wheeze- none, cough- none , dullness-none, rub- none           Chest wall-  Abd-  Br/ Gen/ Rectal-  Not done, not indicated Extrem- cyanosis- none, clubbing, none, atrophy- none, strength- nl Neuro- grossly intact to observation

## 2019-10-06 NOTE — Assessment & Plan Note (Signed)
Benefits from CPAP with good compliance and control Plan- continue auto 5-15, print script for on-line supplies

## 2019-10-08 ENCOUNTER — Other Ambulatory Visit: Payer: Self-pay | Admitting: Internal Medicine

## 2019-10-18 ENCOUNTER — Other Ambulatory Visit: Payer: Self-pay | Admitting: Physician Assistant

## 2019-10-22 ENCOUNTER — Other Ambulatory Visit: Payer: Self-pay | Admitting: Internal Medicine

## 2019-12-06 ENCOUNTER — Other Ambulatory Visit: Payer: Self-pay | Admitting: Internal Medicine

## 2020-01-24 NOTE — Progress Notes (Signed)
CPE  Assessment and Plan:  Essential hypertension Monitor blood pressure at home; call if consistently over 130/80 Continue DASH diet.   Reminder to go to the ER if any CP, SOB, nausea, dizziness, severe HA, changes vision/speech, left arm numbness and tingling and jaw pain. - CBC with Differential/Platelet - CMP/GFR   Hypothyroidism, unspecified hypothyroidism type Hypothyroidism-check TSH level, continue medications the same, reminded to take on an empty stomach 30-70mins before food.  - TSH  Prediabetes Discussed general issues about diabetes pathophysiology and management., Educational material distributed., Suggested low cholesterol diet., Encouraged aerobic exercise., Discussed foot care., Reminded to get yearly retinal exam. - Hemoglobin A1c   Hyperlipidemia -continue medications, check lipids, decrease fatty foods, increase activity.  - Lipid panel  Morbid Obesity (BMI 30+ with OSA and other co morbidities) - follow up 3 months for progress monitoring - increase veggies, decrease carbs - long discussion about weight loss, diet, and exercise - weight goal of <200 lb set  Prediabetes Discussed disease and risks Discussed diet/exercise, weight management  A1C   Vitamin D deficiency At goal at recent check; continue to recommend supplementation for goal of 70-100 Defer vitamin D level  Medication management - Magnesium  Left foot drop Monitor, improved with increased exercise follow up ortho PRN  myositis MONITOR  Obstructive sleep apnea Continue CPAP  Erectile dysfunction Previously on testosterone and cialis    Major depression in remission Continue zoloft, wellbutrin- discussed symptoms of serotonin syndrome.  Lifestyle discussed: diet/exerise, sleep hygiene, stress management, hydration    Discussed med's effects and SE's. Screening labs and tests as requested with regular follow-up as recommended. Over 40 minutes of exam, counseling, chart review  and critical decision making was performed Future Appointments  Date Time Provider [place]  10/05/2020  9:30 AM Deneise Lever, MD [facility] None  01/25/2021  2:00 PM Liane Comber, NP [facility] None    _____________________________________________________________________________ HPI Patient presents for a CPE and for 3 month follow up on htn, hyperlipidemia, prediabetes, hypothyroidism, obesity, vit D def.   He has sleep apnea on CPAP.   He has hx of miositis and L foot drop and follows with ortho PRN, declined surgery, [facility]   He has dx of depression in remission on zoloft 200 mg daily, and more recently added wellbutrin 150 mg. No symptoms of serotonin other than sweating which is unchanged for the patient.   BMI is Body mass index is 33.6 kg/m., he has been working on diet, and has been going back to [employer]. Has been trying to make better choices.  Wt Readings from Last 3 Encounters:  01/26/20 221 lb (100.2 kg)  10/06/19 220 lb (99.8 kg)  08/06/19 217 lb (98.4 kg)   He has not been checking BP at home, he is on metoprolol 25mg  BID, olmesartan 40 mg, HCTZ 12.5 mg, today their BP is BP: 128/76  He has a history of LVH with normal EF, last echo 2011, and normal cardiolite 2014. He does not workout.  He denies chest pain, shortness of breath, dizziness.   He is on cholesterol medication and denies myalgias, had intolerant to statins due to elevated CPK so on zetia and lopid.  His cholesterol is not at goal. States has cut back significantly on cheese. The cholesterol last visit was:   Lab Results  Component Value Date   CHOL 184 07/23/2019   HDL 38 (L) 07/23/2019   LDLCALC 110 (H) 07/23/2019   TRIG  253 (H) 07/23/2019   CHOLHDL 4.8 07/23/2019   He has been working on diet and exercise for prediabetes,  and denies paresthesia of the feet, polydipsia, polyuria and visual disturbances. Last A1C in the office was:  Lab Results   Component Value Date   HGBA1C 5.8 (H) 07/23/2019   Patient is on Vitamin D supplement.   Lab Results  Component Value Date   VD25OH 78 01/26/2019    He is on thyroid medication. His medication was not changed last visit.   Lab Results  Component Value Date   TSH 0.91 07/23/2019    He was following with Dr. Suann Larry years ago for elevated PSA while on testosterone.  Lab Results  Component Value Date   PSA 0.7 01/20/2018    Current Medications:   Current Outpatient Medications (Endocrine & Metabolic):  .  levothyroxine (SYNTHROID) 50 MCG tablet, TAKE 1 TABLET BY MOUTH EVERY DAY  Current Outpatient Medications (Cardiovascular):  Marland Kitchen  EPINEPHrine (EPIPEN 2-PAK) 0.3 mg/0.3 mL IJ SOAJ injection, Inject 0.3 mLs (0.3 mg total) into the muscle once. .  ezetimibe (ZETIA) 10 MG tablet, Take 1 tablet Daily for Cholesterol .  gemfibrozil (LOPID) 600 MG tablet, TAKE 1 TABLET TWICE A DAY FOR CHOLESTEROL .  hydrochlorothiazide (HYDRODIURIL) 12.5 MG tablet, TAKE 1 TABLET BY MOUTH EVERY DAY .  metoprolol tartrate (LOPRESSOR) 25 MG tablet, TAKE 1 TABLET TWICE A DAY FOR BP .  olmesartan (BENICAR) 40 MG tablet, Take 1 tablet Daily for BP   Current Outpatient Medications (Analgesics):  .  aspirin 325 MG tablet, Take 325 mg by mouth daily.     Current Outpatient Medications (Other):  Marland Kitchen  Ascorbic Acid (VITAMIN C) 1000 MG tablet, Take 1,000 mg by mouth daily.  .  B Complex Vitamins (B COMPLEX PO), Take 1 tablet by mouth See admin instructions. Takes M,W,F .  buPROPion (WELLBUTRIN XL) 150 MG 24 hr tablet, TAKE 1 TABLET EVERY MORNING FOR MOOD, FOCUS & CONCENTRATION .  Flaxseed, Linseed, (FLAX SEED OIL) 1000 MG CAPS, Take 2,000 mg by mouth daily. Marland Kitchen  MAGNESIUM PO, Take 750 mg by mouth daily.  .  Multiple Vitamin (MULTIVITAMIN) capsule, Take by mouth. .  sertraline (ZOLOFT) 100 MG tablet, Take 2 tablets Daily for Mood (Patient taking differently: Take 1 tablet Daily for M ood)  Health Maintenance:   Immunization History  Administered Date(s) Administered  . Influenza Split 03/06/2012, 02/24/2013, 03/18/2014  . Influenza, High Dose Seasonal PF 02/12/2016, 04/09/2017, 02/15/2019  . Influenza-Unspecified 03/03/2015, 02/04/2018  . PFIZER SARS-COV-2 Vaccination 07/08/2019, 08/12/2019  . PPD Test 07/26/2013  . Pneumococcal Conjugate-13 02/12/2016  . Pneumococcal Polysaccharide-23 07/26/2013, 01/26/2019  . Pneumococcal-Unspecified 05/04/2004  . Td 04/07/2019  . Tdap 07/24/2011  . Zoster 12/02/2013  . Zoster Recombinat (Shingrix) 01/28/2019, 04/20/2019   Health Maintenance  Topic Date Due  . INFLUENZA VACCINE  12/26/2019  . COLONOSCOPY  03/14/2022  . TETANUS/TDAP  04/06/2029  . COVID-19 Vaccine  Completed  . Hepatitis C Screening  Completed  . PNA vac Low Risk Adult  Completed    Influenza 01/2019 TDAP 2013 Pneumovax 2015 Prevnar: 2017 Shingles vaccine: 2015, 2020 Covid 19 completed- had COVID in Dec  Colonoscopy 03/14/2017 - 5 year follow up   ECHO 08/06/09 Neg cardiolite 2014 CXR 2017 U/S ABD 08/01/11- FATTY LIVER   EYE Exam: Dr. Quay Burow, [facility] 2020 Dental exam: Dr. Conley Canal, last 2021  Patient Care Team: Unk Pinto, MD as PCP - General (Internal Medicine) Irene Shipper, MD as Consulting Physician (  Gastroenterology) Dyke Maes, OD (Optometry)  Medical History:  Past Medical History:  Diagnosis Date  . Cardiomegaly   . Colon polyp   . Depression   . Fatty liver disease, nonalcoholic   . Gallstones   . Hypertension   . Hypogonadism male   . Mixed hyperlipidemia   . Obesity   . Pancreas (digestive gland) works poorly   . Prediabetes   . Sleep apnea   . Testosterone deficiency 08/03/2009  . Thyroid disease   . Vitamin D deficiency    Allergies Allergies  Allergen Reactions  . Ace Inhibitors Other (See Comments)    cough  . Bee Venom Other (See Comments)    SURGICAL HISTORY He  has a past surgical history that includes Cholecystectomy (1976);  Appendectomy (1964); Ankle fracture surgery (Left, 1986); Carpal tunnel release (Bilateral, 1996); Tibia IM nail insertion (Right, 03/21/2016); Colonoscopy; and Polypectomy. FAMILY HISTORY His family history includes Diabetes in his father, mother, and sister; Heart disease in his father; Hypertension in his father. SOCIAL HISTORY He  reports that he quit smoking about 45 years ago. His smoking use included cigarettes. He has a 14.00 pack-year smoking history. He has never used smokeless tobacco. He reports that he does not drink alcohol and does not use drugs.    Review of Systems:  Review of Systems  Constitutional: Negative.  Negative for malaise/fatigue and weight loss.  HENT: Negative.  Negative for hearing loss and tinnitus.   Eyes: Negative.  Negative for blurred vision and double vision.  Respiratory: Negative.  Negative for cough, sputum production, shortness of breath and wheezing.   Cardiovascular: Negative.  Negative for chest pain, palpitations, orthopnea, claudication, leg swelling and PND.  Gastrointestinal: Negative.  Negative for abdominal pain, blood in stool, constipation, diarrhea, heartburn, melena, nausea and vomiting.  Genitourinary: Negative.   Musculoskeletal: Negative.  Negative for falls, joint pain and myalgias.  Skin: Negative.  Negative for rash.  Neurological: Negative.  Negative for dizziness, tingling, sensory change, weakness and headaches.  Endo/Heme/Allergies: Negative.  Negative for polydipsia.  Psychiatric/Behavioral: Negative.  Negative for depression, memory loss, substance abuse and suicidal ideas. The patient is not nervous/anxious and does not have insomnia.   All other systems reviewed and are negative.   Physical Exam: Estimated body mass index is 33.6 kg/m as calculated from the following:   Height as of this encounter: 5\' 8"  (1.727 m).   Weight as of this encounter: 221 lb (100.2 kg). BP 128/76   Pulse 67   Temp 97.7 F (36.5 C)   Ht 5'  8" (1.727 m)   Wt 221 lb (100.2 kg)   SpO2 95%   BMI 33.60 kg/m  General Appearance: Well nourished, in no apparent distress.  Eyes: PERRLA, EOMs, conjunctiva no swelling or erythema, normal fundi and vessels.  Sinuses: No Frontal/maxillary tenderness  ENT/Mouth: Ext aud canals impacted bilaterally with cerumen, normal light reflex with TMs without erythema, bulging. Good dentition. No erythema, swelling, or exudate on post pharynx. Tonsils not swollen or erythematous. Hearing normal.  Neck: Supple, thyroid normal. No bruits  Respiratory: Respiratory effort normal, BS equal bilaterally without rales, rhonchi, wheezing or stridor.  Cardio: RRR without murmurs, rubs or gallops. Brisk peripheral pulses without edema.  Chest: symmetric, with normal excursions and percussion.  Abdomen: Soft, obese, nontender, with large vertical and right horizontal scar no guarding, rebound, hernias, masses, or organomegaly.  Lymphatics: Non tender without lymphadenopathy.  Genitourinary: defer Musculoskeletal: Full ROM all peripheral extremities,5/5 strength, and normal gait.  Skin:   Warm, dry without rashes, lesions, ecchymosis. Neuro: Cranial nerves intact, reflexes decreased bilateral legs, worse left than right with decrease dorsiflexion/plantar on left. Normal muscle tone, no cerebellar symptoms. Sensation intact.  Psych: Awake and oriented X 3, normal affect, Insight and Judgment appropriate.     Vicie Mutters 2:12 PM [facility]

## 2020-01-26 ENCOUNTER — Encounter: Payer: Self-pay | Admitting: Physician Assistant

## 2020-01-26 ENCOUNTER — Ambulatory Visit (INDEPENDENT_AMBULATORY_CARE_PROVIDER_SITE_OTHER): Payer: PPO | Admitting: Physician Assistant

## 2020-01-26 ENCOUNTER — Other Ambulatory Visit: Payer: Self-pay

## 2020-01-26 VITALS — BP 128/76 | HR 67 | Temp 97.7°F | Ht 68.0 in | Wt 221.0 lb

## 2020-01-26 DIAGNOSIS — Z136 Encounter for screening for cardiovascular disorders: Secondary | ICD-10-CM | POA: Diagnosis not present

## 2020-01-26 DIAGNOSIS — Z0001 Encounter for general adult medical examination with abnormal findings: Secondary | ICD-10-CM

## 2020-01-26 DIAGNOSIS — E782 Mixed hyperlipidemia: Secondary | ICD-10-CM | POA: Diagnosis not present

## 2020-01-26 DIAGNOSIS — R7309 Other abnormal glucose: Secondary | ICD-10-CM

## 2020-01-26 DIAGNOSIS — E559 Vitamin D deficiency, unspecified: Secondary | ICD-10-CM

## 2020-01-26 DIAGNOSIS — Z79899 Other long term (current) drug therapy: Secondary | ICD-10-CM | POA: Diagnosis not present

## 2020-01-26 DIAGNOSIS — M21372 Foot drop, left foot: Secondary | ICD-10-CM

## 2020-01-26 DIAGNOSIS — Z125 Encounter for screening for malignant neoplasm of prostate: Secondary | ICD-10-CM | POA: Diagnosis not present

## 2020-01-26 DIAGNOSIS — M609 Myositis, unspecified: Secondary | ICD-10-CM

## 2020-01-26 DIAGNOSIS — Z0000 Encounter for general adult medical examination without abnormal findings: Secondary | ICD-10-CM

## 2020-01-26 DIAGNOSIS — E039 Hypothyroidism, unspecified: Secondary | ICD-10-CM

## 2020-01-26 DIAGNOSIS — I10 Essential (primary) hypertension: Secondary | ICD-10-CM | POA: Diagnosis not present

## 2020-01-26 DIAGNOSIS — G4733 Obstructive sleep apnea (adult) (pediatric): Secondary | ICD-10-CM

## 2020-01-26 DIAGNOSIS — K760 Fatty (change of) liver, not elsewhere classified: Secondary | ICD-10-CM

## 2020-01-26 DIAGNOSIS — N529 Male erectile dysfunction, unspecified: Secondary | ICD-10-CM

## 2020-01-26 DIAGNOSIS — F325 Major depressive disorder, single episode, in full remission: Secondary | ICD-10-CM

## 2020-01-26 NOTE — Patient Instructions (Addendum)
Please be aware that some of the medications that you are on can sometimes cause a rare and potentially dangerous adverse reaction, called SEROTONIN SYNDROME: Symptoms of this condition include (but are not limited to):  Agitation or restlessness, confusion, rapid heart rate and high blood pressure, dilated pupils, loss of muscle coordination or twitching muscles, muscle rigidity/stiffness, sweating and/or flushing, diarrhea, headache, shivering, goose bumps. If you have any of these symptoms you may have to stop the medication. Call your health care provider immediately.  Severe serotonin syndrome can be life-threatening emergency. Signs and symptoms of a severe reaction may include: high fever, seizures, irregular heartbeat, unconsciousness or altered level of awareness or personality changes.  If you have any of these new symptoms, call 911 or have someone take you to the emergency room.   General eating tips  What to Avoid . Avoid added sugars o Often added sugar can be found in processed foods such as many condiments, dry cereals, cakes, cookies, chips, crisps, crackers, candies, sweetened drinks, etc.  o Read labels and AVOID/DECREASE use of foods with the following in their ingredient list: Sugar, fructose, high fructose corn syrup, sucrose, glucose, maltose, dextrose, molasses, cane sugar, brown sugar, any type of syrup, agave nectar, etc.   . Avoid snacking in between meals- drink water or if you feel you need a snack, pick a high water content snack such as cucumbers, watermelon, or any veggie.  Marland Kitchen Avoid foods made with flour o If you are going to eat food made with flour, choose those made with whole-grains; and, minimize your consumption as much as is tolerable . Avoid processed foods o These foods are generally stocked in the middle of the grocery store.  o Focus on shopping on the perimeter of the grocery.  What to Include . Vegetables o GREEN LEAFY VEGETABLES: Kale, spinach, mustard  greens, collard greens, cabbage, broccoli, etc. o OTHER: Asparagus, cauliflower, eggplant, carrots, peas, Brussel sprouts, tomatoes, bell peppers, zucchini, beets, cucumbers, etc. . Grains, seeds, and legumes o Beans: kidney beans, black eyed peas, garbanzo beans, black beans, pinto beans, etc. o Whole, unrefined grains: brown rice, barley, bulgur, oatmeal, etc. . Healthy fats  o Avoid highly processed fats such as vegetable oil o Examples of healthy fats: avocado, olives, virgin olive oil, dark chocolate (?72% Cocoa), nuts (peanuts, almonds, walnuts, cashews, pecans, etc.) o Please still do small amount of these healthy fats, they are dense in calories.  . Low - Moderate Intake of Animal Sources of Protein o Meat sources: chicken, [place], salmon, tuna. Limit to 4 ounces of meat at one time or the size of your palm. o Consider limiting dairy sources, but when choosing dairy focus on: PLAIN [place] yogurt, cottage cheese, high-protein milk . Fruit o Choose berries

## 2020-01-27 LAB — COMPLETE METABOLIC PANEL WITH GFR
AG Ratio: 2 (calc) (ref 1.0–2.5)
ALT: 39 U/L (ref 9–46)
AST: 44 U/L — ABNORMAL HIGH (ref 10–35)
Albumin: 4.9 g/dL (ref 3.6–5.1)
Alkaline phosphatase (APISO): 51 U/L (ref 35–144)
BUN: 24 mg/dL (ref 7–25)
CO2: 29 mmol/L (ref 20–32)
Calcium: 10.3 mg/dL (ref 8.6–10.3)
Chloride: 103 mmol/L (ref 98–110)
Creat: 1.13 mg/dL (ref 0.70–1.25)
GFR, Est African American: 77 mL/min/{1.73_m2} (ref >=60)
GFR, Est Non African American: 66 mL/min/{1.73_m2} (ref >=60)
Globulin: 2.4 g/dL (calc) (ref 1.9–3.7)
Glucose, Bld: 107 mg/dL — ABNORMAL HIGH (ref 65–99)
Potassium: 4 mmol/L (ref 3.5–5.3)
Sodium: 144 mmol/L (ref 135–146)
Total Bilirubin: 0.3 mg/dL (ref 0.2–1.2)
Total Protein: 7.3 g/dL (ref 6.1–8.1)

## 2020-01-27 LAB — CBC WITH DIFFERENTIAL/PLATELET
Absolute Monocytes: 624 cells/uL (ref 200–950)
Basophils Absolute: 52 cells/uL (ref 0–200)
Basophils Relative: 1.2 %
Eosinophils Absolute: 370 cells/uL (ref 15–500)
Eosinophils Relative: 8.6 %
HCT: 44.3 % (ref 38.5–50.0)
Hemoglobin: 15.1 g/dL (ref 13.2–17.1)
Lymphs Abs: 976 cells/uL (ref 850–3900)
MCH: 31.1 pg (ref 27.0–33.0)
MCHC: 34.1 g/dL (ref 32.0–36.0)
MCV: 91.3 fL (ref 80.0–100.0)
MPV: 10.9 fL (ref 7.5–12.5)
Monocytes Relative: 14.5 %
Neutro Abs: 2279 cells/uL (ref 1500–7800)
Neutrophils Relative %: 53 %
Platelets: 226 10*3/uL (ref 140–400)
RBC: 4.85 10*6/uL (ref 4.20–5.80)
RDW: 13.1 % (ref 11.0–15.0)
Total Lymphocyte: 22.7 %
WBC: 4.3 10*3/uL (ref 3.8–10.8)

## 2020-01-27 LAB — URINALYSIS, ROUTINE W REFLEX MICROSCOPIC
Bilirubin Urine: NEGATIVE
Glucose, UA: NEGATIVE
Hgb urine dipstick: NEGATIVE
Ketones, ur: NEGATIVE
Leukocytes,Ua: NEGATIVE
Nitrite: NEGATIVE
Protein, ur: NEGATIVE
Specific Gravity, Urine: 1.024 (ref 1.001–1.03)
pH: 6 (ref 5.0–8.0)

## 2020-01-27 LAB — LIPID PANEL
Cholesterol: 170 mg/dL (ref <200)
HDL: 36 mg/dL — ABNORMAL LOW (ref >=40)
LDL Cholesterol (Calc): 84 mg/dL (calc)
Non-HDL Cholesterol (Calc): 134 mg/dL (calc) — ABNORMAL HIGH (ref <130)
Total CHOL/HDL Ratio: 4.7 (calc) (ref <5.0)
Triglycerides: 378 mg/dL — ABNORMAL HIGH (ref <150)

## 2020-01-27 LAB — MICROALBUMIN / CREATININE URINE RATIO
Creatinine, Urine: 147 mg/dL (ref 20–320)
Microalb Creat Ratio: 10 mcg/mg creat (ref <30)
Microalb, Ur: 1.5 mg/dL

## 2020-01-27 LAB — HEMOGLOBIN A1C
Hgb A1c MFr Bld: 5.9 % of total Hgb — ABNORMAL HIGH (ref <5.7)
Mean Plasma Glucose: 123 (calc)
eAG (mmol/L): 6.8 (calc)

## 2020-01-27 LAB — TSH: TSH: 1.05 mIU/L (ref 0.40–4.50)

## 2020-01-27 LAB — MAGNESIUM: Magnesium: 2.4 mg/dL (ref 1.5–2.5)

## 2020-01-27 LAB — VITAMIN D 25 HYDROXY (VIT D DEFICIENCY, FRACTURES): Vit D, 25-Hydroxy: 67 ng/mL (ref 30–100)

## 2020-01-27 LAB — PSA: PSA: 0.4 ng/mL (ref <=4.0)

## 2020-02-10 ENCOUNTER — Other Ambulatory Visit: Payer: Self-pay | Admitting: Physician Assistant

## 2020-02-20 ENCOUNTER — Other Ambulatory Visit: Payer: Self-pay | Admitting: Physician Assistant

## 2020-02-27 ENCOUNTER — Other Ambulatory Visit: Payer: Self-pay | Admitting: Internal Medicine

## 2020-02-27 MED ORDER — SERTRALINE HCL 100 MG PO TABS: ORAL_TABLET | ORAL | 0 refills | Status: DC

## 2020-03-22 DIAGNOSIS — D485 Neoplasm of uncertain behavior of skin: Secondary | ICD-10-CM | POA: Diagnosis not present

## 2020-03-22 DIAGNOSIS — D1801 Hemangioma of skin and subcutaneous tissue: Secondary | ICD-10-CM | POA: Diagnosis not present

## 2020-03-22 DIAGNOSIS — D2239 Melanocytic nevi of other parts of face: Secondary | ICD-10-CM | POA: Diagnosis not present

## 2020-03-22 DIAGNOSIS — L814 Other melanin hyperpigmentation: Secondary | ICD-10-CM | POA: Diagnosis not present

## 2020-03-22 DIAGNOSIS — L570 Actinic keratosis: Secondary | ICD-10-CM | POA: Diagnosis not present

## 2020-04-12 ENCOUNTER — Other Ambulatory Visit: Payer: Self-pay | Admitting: Adult Health

## 2020-05-15 NOTE — Progress Notes (Signed)
3 MONTH VISIT  Assessment and Plan:  Essential hypertension Continue medication;  Monitor blood pressure at home; call if consistently over 130/80 Continue DASH diet.   Reminder to go to the ER if any CP, SOB, nausea, dizziness, severe HA, changes vision/speech, left arm numbness and tingling and jaw pain. - CBC with Differential/Platelet - CMP/GFR   Hypothyroidism, unspecified hypothyroidism type Hypothyroidism-check TSH level, continue medications the same, reminded to take on an empty stomach 30-66mins before food.  - TSH  Prediabetes Discussed general issues about diabetes pathophysiology and management., Educational material distributed., Suggested low cholesterol diet., Encouraged aerobic exercise., Discussed foot care., Reminded to get yearly retinal exam. A1C q69m; defer today, check CMP   Hyperlipidemia -continue medications, check lipids, decrease fatty foods, increase activity.  - Lipid panel  Morbid Obesity (BMI 30+ with OSA and other co morbidities) - follow up 3 months for progress monitoring - increase veggies, decrease carbs - long discussion about weight loss, diet, and exercise - weight goal of <200 lb set   Vitamin D deficiency At goal at recent check; continue to recommend supplementation for goal of 70-100 Defer vitamin D level  Medication management - Magnesium  Left foot drop Monitor, improved with increased exercise Continue strength training; exercises demonstrated follow up ortho PRN  myositis MONITOR  Obstructive sleep apnea Continue CPAP   Major depression in remission Continue zoloft, wellbutrin  Discussed getting out of the house more, planning activities to look forward to Lifestyle discussed: diet/exerise, sleep hygiene, stress management, hydration    Discussed med's effects and SE's. Screening labs and tests as requested with regular follow-up as recommended. Over 40 minutes of exam, counseling, chart review and critical decision  making was performed Future Appointments  Date Time Provider [place]  10/05/2020  9:30 AM Deneise Lever, MD [facility] None  01/25/2021  2:00 PM Liane Comber, NP [facility] None    HPI Patient presents for 3 month follow up on htn, hyperlipidemia, prediabetes, hypothyroidism, obesity, vit D def.   He has sleep apnea with CPAP, reports 100% compliance with restorative sleep.   He has hx of miositis and L foot drop and follows with ortho PRN, declined surgery, improved with exercise, [facility].   He has dx of depression in remission on zoloft 200 mg daily, and more recently added wellbutrin 150 mg, due to some depressed mood related to son moving and pandemic, plans to try to get out more, ride his 3 wheeler motercycle, take some trips with wife.   BMI is Body mass index is 33.3 kg/m., he has been working on diet, admits exercises is limited since covid, [facility] Has been trying to make better choices.  Wt Readings from Last 3 Encounters:  05/16/20 219 lb (99.3 kg)  01/26/20 221 lb (100.2 kg)  10/06/19 220 lb (99.8 kg)   He reports BP have been well controlled at home, he is on metoprolol 25mg  BID, olmesartan 40 mg, HCTZ 12.5 mg, today their BP is BP: 128/68  He has a history of LVH with normal EF, last echo 2011, and normal cardiolite 2014. He does not workout.  He denies chest pain, shortness of breath, dizziness.   He is on cholesterol medication and denies myalgias, had intolerant to statins due to elevated CPK on zetia and lopid.  His LDL cholesterol is at goal. The cholesterol last visit was:   Lab Results  Component Value Date   CHOL  170 01/26/2020   HDL 36 (L) 01/26/2020   LDLCALC 84 01/26/2020   TRIG 378 (H) 01/26/2020   CHOLHDL 4.7 01/26/2020   He has been working on diet and exercise for prediabetes,  and denies paresthesia of the feet, polydipsia, polyuria and visual disturbances. Last A1C in the office  was:  Lab Results  Component Value Date   HGBA1C 5.9 (H) 01/26/2020   Patient is on Vitamin D supplement.   Lab Results  Component Value Date   VD25OH 67 01/26/2020    He is on thyroid medication. His medication was not changed last visit. Takes 50 mcg daily.  Lab Results  Component Value Date   TSH 1.05 01/26/2020    He was following with Dr. Risa Grill PRN, not recently, has had elevated PSA while on testosterone, recently normal.  Lab Results  Component Value Date   PSA 0.4 01/26/2020    Current Medications:  Current Outpatient Medications on File Prior to Visit  Medication Sig Dispense Refill  . Ascorbic Acid (VITAMIN C) 1000 MG tablet Take 1,000 mg by mouth daily.    Marland Kitchen aspirin 325 MG tablet Take 325 mg by mouth daily.    . B Complex Vitamins (B COMPLEX PO) Take 1 tablet by mouth See admin instructions. Takes M,W,F    . buPROPion (WELLBUTRIN XL) 150 MG 24 hr tablet TAKE 1 TABLET EVERY MORNING FOR MOOD, FOCUS & CONCENTRATION 90 tablet 3  . EPINEPHrine (EPIPEN 2-PAK) 0.3 mg/0.3 mL IJ SOAJ injection Inject 0.3 mLs (0.3 mg total) into the muscle once. 2 Device 0  . ezetimibe (ZETIA) 10 MG tablet Take 1 tablet Daily for Cholesterol 90 tablet 3  . Flaxseed, Linseed, (FLAX SEED OIL) 1000 MG CAPS Take 2,000 mg by mouth daily.    Marland Kitchen gemfibrozil (LOPID) 600 MG tablet TAKE 1 TABLET TWICE A DAY FOR CHOLESTEROL 180 tablet 3  . hydrochlorothiazide (HYDRODIURIL) 12.5 MG tablet TAKE 1 TABLET BY MOUTH EVERY DAY 90 tablet 1  . levothyroxine (SYNTHROID) 50 MCG tablet TAKE 1 TABLET BY MOUTH EVERY DAY 90 tablet 3  . MAGNESIUM PO Take 750 mg by mouth daily.     . metoprolol tartrate (LOPRESSOR) 25 MG tablet TAKE 1 TABLET TWICE A DAY FOR BP 180 tablet 3  . Multiple Vitamin (MULTIVITAMIN) capsule Take by mouth.    . olmesartan (BENICAR) 40 MG tablet Take    1 tablet    Daily     for BP 90 tablet 0  . sertraline (ZOLOFT) 100 MG tablet Take      2 tablets (200 mg)         Daily         for Mood 180 tablet  0   No current facility-administered medications on file prior to visit.   Medical History:  Past Medical History:  Diagnosis Date  . Cardiomegaly   . Colon polyp   . Depression   . Fatty liver disease, nonalcoholic   . Gallstones   . Hypertension   . Hypogonadism male   . Mixed hyperlipidemia   . Obesity   . Pancreas (digestive gland) works poorly   . Prediabetes   . Sleep apnea   . Testosterone deficiency 08/03/2009  . Thyroid disease   . Vitamin D deficiency    Allergies Allergies  Allergen Reactions  . Ace Inhibitors Other (See Comments)    cough  . Bee Venom Other (See Comments)    SURGICAL HISTORY He  has a past surgical history that  includes Cholecystectomy (1976); Appendectomy (1964); Ankle fracture surgery (Left, 1986); Carpal tunnel release (Bilateral, 1996); Tibia IM nail insertion (Right, 03/21/2016); Colonoscopy; and Polypectomy. FAMILY HISTORY His family history includes Diabetes in his father, mother, and sister; Heart disease in his father; Hypertension in his father. SOCIAL HISTORY He  reports that he quit smoking about 46 years ago. His smoking use included cigarettes. He has a 14.00 pack-year smoking history. He has never used smokeless tobacco. He reports that he does not drink alcohol and does not use drugs.    Review of Systems:  Review of Systems  Constitutional: Negative.  Negative for malaise/fatigue and weight loss.  HENT: Negative.  Negative for hearing loss and tinnitus.   Eyes: Negative.  Negative for blurred vision and double vision.  Respiratory: Negative.  Negative for cough, sputum production, shortness of breath and wheezing.   Cardiovascular: Negative.  Negative for chest pain, palpitations, orthopnea, claudication, leg swelling and PND.  Gastrointestinal: Negative.  Negative for abdominal pain, blood in stool, constipation, diarrhea, heartburn, melena, nausea and vomiting.  Genitourinary: Negative.   Musculoskeletal: Negative.   Negative for falls, joint pain and myalgias.  Skin: Negative.  Negative for rash.  Neurological: Positive for focal weakness (left foot drop, chronic, unchanged). Negative for dizziness, tingling, sensory change, weakness and headaches.  Endo/Heme/Allergies: Negative.  Negative for polydipsia.  Psychiatric/Behavioral: Negative.  Negative for depression, memory loss, substance abuse and suicidal ideas. The patient is not nervous/anxious and does not have insomnia.   All other systems reviewed and are negative.   Physical Exam: Estimated body mass index is 33.3 kg/m as calculated from the following:   Height as of 01/26/20: 5\' 8"  (1.727 m).   Weight as of this encounter: 219 lb (99.3 kg). BP 128/68   Pulse (!) 53   Temp (!) 97.5 F (36.4 C)   Wt 219 lb (99.3 kg)   SpO2 96%   BMI 33.30 kg/m  General Appearance: Well nourished, in no apparent distress.  Eyes: PERRLA, EOMs, conjunctiva no swelling or erythema, normal fundi and vessels.  Sinuses: No Frontal/maxillary tenderness  ENT/Mouth: Ext aud canals impacted bilaterally with cerumen, normal light reflex with TMs without erythema, bulging. Good dentition. No erythema, swelling, or exudate on post pharynx. Tonsils not swollen or erythematous. Hearing normal.  Neck: Supple, thyroid normal. No bruits  Respiratory: Respiratory effort normal, BS equal bilaterally without rales, rhonchi, wheezing or stridor.  Cardio: RRR without murmurs, rubs or gallops. Brisk peripheral pulses without edema.  Chest: symmetric, with normal excursions and percussion.  Abdomen: Soft, obese, nontender, with large vertical and right horizontal scar no guarding, rebound, hernias, masses, or organomegaly.  Lymphatics: Non tender without lymphadenopathy.  Genitourinary: defer Musculoskeletal: Full ROM all peripheral extremities,5/5 strength other than L foot drop 3/5, plantar flexion is intact, and mildly antalgic gait Skin:   Warm, dry without rashes, lesions,  ecchymosis. Neuro: Cranial nerves intact, reflexes decreased bilateral legs, worse left than right with decrease . Normal muscle tone, no cerebellar symptoms. Sensation intact.  Psych: Awake and oriented X 3, normal affect, Insight and Judgment appropriate.    Gorden Harms Ciji Boston 11:26 AM [facility]

## 2020-05-16 ENCOUNTER — Ambulatory Visit (INDEPENDENT_AMBULATORY_CARE_PROVIDER_SITE_OTHER): Payer: PPO | Admitting: Adult Health

## 2020-05-16 ENCOUNTER — Other Ambulatory Visit: Payer: Self-pay

## 2020-05-16 ENCOUNTER — Encounter: Payer: Self-pay | Admitting: Adult Health

## 2020-05-16 VITALS — BP 128/68 | HR 53 | Temp 97.5°F | Wt 219.0 lb

## 2020-05-16 DIAGNOSIS — F325 Major depressive disorder, single episode, in full remission: Secondary | ICD-10-CM | POA: Diagnosis not present

## 2020-05-16 DIAGNOSIS — E782 Mixed hyperlipidemia: Secondary | ICD-10-CM | POA: Diagnosis not present

## 2020-05-16 DIAGNOSIS — E559 Vitamin D deficiency, unspecified: Secondary | ICD-10-CM | POA: Diagnosis not present

## 2020-05-16 DIAGNOSIS — Z79899 Other long term (current) drug therapy: Secondary | ICD-10-CM

## 2020-05-16 DIAGNOSIS — I10 Essential (primary) hypertension: Secondary | ICD-10-CM | POA: Diagnosis not present

## 2020-05-16 DIAGNOSIS — R7309 Other abnormal glucose: Secondary | ICD-10-CM

## 2020-05-16 DIAGNOSIS — E039 Hypothyroidism, unspecified: Secondary | ICD-10-CM | POA: Diagnosis not present

## 2020-05-16 DIAGNOSIS — G4733 Obstructive sleep apnea (adult) (pediatric): Secondary | ICD-10-CM | POA: Diagnosis not present

## 2020-05-16 NOTE — Patient Instructions (Signed)
Goals    . Blood Pressure < 130/80    . Exercise 150 min/wk Moderate Activity    . Weight (lb) < 200 lb (90.7 kg)       High-Fiber Diet Fiber, also called dietary fiber, is a type of carbohydrate that is found in fruits, vegetables, whole grains, and beans. A high-fiber diet can have many health benefits. Your health care provider may recommend a high-fiber diet to help:  Prevent constipation. Fiber can make your bowel movements more regular.  Lower your cholesterol.  Relieve the following conditions: ? Swelling of veins in the anus (hemorrhoids). ? Swelling and irritation (inflammation) of specific areas of the digestive tract (uncomplicated diverticulosis). ? A problem of the large intestine (colon) that sometimes causes pain and diarrhea (irritable bowel syndrome, IBS).  Prevent overeating as part of a weight-loss plan.  Prevent heart disease, type 2 diabetes, and certain cancers. What is my plan? The recommended daily fiber intake in grams (g) includes:  38 g for men age 53 or younger.  30 g for men over age 106.  63 g for women age 31 or younger.  21 g for women over age 65. You can get the recommended daily intake of dietary fiber by:  Eating a variety of fruits, vegetables, grains, and beans.  Taking a fiber supplement, if it is not possible to get enough fiber through your diet. What do I need to know about a high-fiber diet?  It is better to get fiber through food sources rather than from fiber supplements. There is not a lot of research about how effective supplements are.  Always check the fiber content on the nutrition facts label of any prepackaged food. Look for foods that contain 5 g of fiber or more per serving.  Talk with a diet and nutrition specialist (dietitian) if you have questions about specific foods that are recommended or not recommended for your medical condition, especially if those foods are not listed below.  Gradually increase how much fiber  you consume. If you increase your intake of dietary fiber too quickly, you may have bloating, cramping, or gas.  Drink plenty of water. Water helps you to digest fiber. What are tips for following this plan?  Eat a wide variety of high-fiber foods.  Make sure that half of the grains that you eat each day are whole grains.  Eat breads and cereals that are made with whole-grain flour instead of refined flour or white flour.  Eat brown rice, bulgur wheat, or millet instead of white rice.  Start the day with a breakfast that is high in fiber, such as a cereal that contains 5 g of fiber or more per serving.  Use beans in place of meat in soups, salads, and pasta dishes.  Eat high-fiber snacks, such as berries, raw vegetables, nuts, and popcorn.  Choose whole fruits and vegetables instead of processed forms like juice or sauce. What foods can I eat?  Fruits Berries. Pears. Apples. Oranges. Avocado. Prunes and raisins. Dried figs. Vegetables Sweet potatoes. Spinach. Kale. Artichokes. Cabbage. Broccoli. Cauliflower. Green peas. Carrots. Squash. Grains Whole-grain breads. Multigrain cereal. Oats and oatmeal. Brown rice. Barley. Bulgur wheat. [place]. Quinoa. Bran muffins. Popcorn. Rye wafer crackers. Meats and other proteins Navy, kidney, and pinto beans. Soybeans. Split peas. Lentils. Nuts and seeds. Dairy Fiber-fortified yogurt. Beverages Fiber-fortified soy milk. Fiber-fortified orange juice. Other foods Fiber bars. The items listed above may not be a complete list of recommended foods and beverages. Contact a  dietitian for more options. What foods are not recommended? Fruits Fruit juice. Cooked, strained fruit. Vegetables Fried potatoes. Canned vegetables. Well-cooked vegetables. Grains White bread. Pasta made with refined flour. White rice. Meats and other proteins Fatty cuts of meat. Fried chicken or fried fish. Dairy Milk. Yogurt. Cream cheese. Sour cream. Fats and  oils Butters. Beverages Soft drinks. Other foods Cakes and pastries. The items listed above may not be a complete list of foods and beverages to avoid. Contact a dietitian for more information. Summary  Fiber is a type of carbohydrate. It is found in fruits, vegetables, whole grains, and beans.  There are many health benefits of eating a high-fiber diet, such as preventing constipation, lowering blood cholesterol, helping with weight loss, and reducing your risk of heart disease, diabetes, and certain cancers.  Gradually increase your intake of fiber. Increasing too fast can result in cramping, bloating, and gas. Drink plenty of water while you increase your fiber.  The best sources of fiber include whole fruits and vegetables, whole grains, nuts, seeds, and beans. This information is not intended to replace advice given to you by your health care provider. Make sure you discuss any questions you have with your health care provider. Document Revised: 03/17/2017 Document Reviewed: 03/17/2017 Elsevier Patient Education  2020 [employer].

## 2020-05-17 LAB — CBC WITH DIFFERENTIAL/PLATELET
Absolute Monocytes: 588 cells/uL (ref 200–950)
Basophils Absolute: 48 cells/uL (ref 0–200)
Basophils Relative: 1.3 %
Eosinophils Absolute: 141 cells/uL (ref 15–500)
Eosinophils Relative: 3.8 %
HCT: 42.4 % (ref 38.5–50.0)
Hemoglobin: 14.6 g/dL (ref 13.2–17.1)
Lymphs Abs: 995 cells/uL (ref 850–3900)
MCH: 31.3 pg (ref 27.0–33.0)
MCHC: 34.4 g/dL (ref 32.0–36.0)
MCV: 91 fL (ref 80.0–100.0)
MPV: 10.7 fL (ref 7.5–12.5)
Monocytes Relative: 15.9 %
Neutro Abs: 1928 cells/uL (ref 1500–7800)
Neutrophils Relative %: 52.1 %
Platelets: 244 10*3/uL (ref 140–400)
RBC: 4.66 10*6/uL (ref 4.20–5.80)
RDW: 13 % (ref 11.0–15.0)
Total Lymphocyte: 26.9 %
WBC: 3.7 10*3/uL — ABNORMAL LOW (ref 3.8–10.8)

## 2020-05-17 LAB — COMPLETE METABOLIC PANEL WITH GFR
AG Ratio: 2 (calc) (ref 1.0–2.5)
ALT: 37 U/L (ref 9–46)
AST: 45 U/L — ABNORMAL HIGH (ref 10–35)
Albumin: 4.7 g/dL (ref 3.6–5.1)
Alkaline phosphatase (APISO): 49 U/L (ref 35–144)
BUN: 19 mg/dL (ref 7–25)
CO2: 31 mmol/L (ref 20–32)
Calcium: 10.4 mg/dL — ABNORMAL HIGH (ref 8.6–10.3)
Chloride: 103 mmol/L (ref 98–110)
Creat: 0.88 mg/dL (ref 0.70–1.25)
GFR, Est African American: 102 mL/min/{1.73_m2} (ref >=60)
GFR, Est Non African American: 88 mL/min/{1.73_m2} (ref >=60)
Globulin: 2.4 g/dL (calc) (ref 1.9–3.7)
Glucose, Bld: 89 mg/dL (ref 65–99)
Potassium: 4.5 mmol/L (ref 3.5–5.3)
Sodium: 141 mmol/L (ref 135–146)
Total Bilirubin: 0.3 mg/dL (ref 0.2–1.2)
Total Protein: 7.1 g/dL (ref 6.1–8.1)

## 2020-05-17 LAB — LIPID PANEL
Cholesterol: 163 mg/dL (ref <200)
HDL: 36 mg/dL — ABNORMAL LOW (ref >=40)
LDL Cholesterol (Calc): 88 mg/dL (calc)
Non-HDL Cholesterol (Calc): 127 mg/dL (calc) (ref <130)
Total CHOL/HDL Ratio: 4.5 (calc) (ref <5.0)
Triglycerides: 324 mg/dL — ABNORMAL HIGH (ref <150)

## 2020-05-17 LAB — TSH: TSH: 0.86 mIU/L (ref 0.40–4.50)

## 2020-05-17 LAB — MAGNESIUM: Magnesium: 2.3 mg/dL (ref 1.5–2.5)

## 2020-05-20 ENCOUNTER — Other Ambulatory Visit: Payer: Self-pay | Admitting: Internal Medicine

## 2020-05-28 ENCOUNTER — Other Ambulatory Visit: Payer: Self-pay | Admitting: Internal Medicine

## 2020-05-28 DIAGNOSIS — I10 Essential (primary) hypertension: Secondary | ICD-10-CM

## 2020-05-28 MED ORDER — OLMESARTAN MEDOXOMIL 40 MG PO TABS
ORAL_TABLET | ORAL | 0 refills | Status: DC
Start: 2020-05-28 — End: 2020-08-27

## 2020-06-02 DIAGNOSIS — I491 Atrial premature depolarization: Secondary | ICD-10-CM | POA: Diagnosis not present

## 2020-06-02 DIAGNOSIS — R55 Syncope and collapse: Secondary | ICD-10-CM | POA: Diagnosis not present

## 2020-06-02 DIAGNOSIS — W19XXXA Unspecified fall, initial encounter: Secondary | ICD-10-CM | POA: Diagnosis not present

## 2020-06-15 DIAGNOSIS — L570 Actinic keratosis: Secondary | ICD-10-CM | POA: Diagnosis not present

## 2020-07-17 DIAGNOSIS — L570 Actinic keratosis: Secondary | ICD-10-CM | POA: Diagnosis not present

## 2020-07-17 DIAGNOSIS — L578 Other skin changes due to chronic exposure to nonionizing radiation: Secondary | ICD-10-CM | POA: Diagnosis not present

## 2020-08-27 ENCOUNTER — Other Ambulatory Visit: Payer: Self-pay | Admitting: Internal Medicine

## 2020-08-27 DIAGNOSIS — I10 Essential (primary) hypertension: Secondary | ICD-10-CM

## 2020-08-28 NOTE — Progress Notes (Signed)
ANNUAL MEDICARE VISIT AND 6 MONTH FOLLOW UP  Assessment and Plan:  Medicare Annual Visit Due annually   Essential hypertension Monitor blood pressure at home; call if consistently over 130/80 Continue DASH diet.   Reminder to go to the ER if any CP, SOB, nausea, dizziness, severe HA, changes vision/speech, left arm numbness and tingling and jaw pain. - CBC with Differential/Platelet - CMP/GFR   Hypothyroidism, unspecified hypothyroidism type Hypothyroidism-check TSH level, continue medications the same, reminded to take on an empty stomach 30-40mins before food.  - TSH  Prediabetes Discussed general issues about diabetes pathophysiology and management., Educational material distributed., Suggested low cholesterol diet., Encouraged aerobic exercise., Discussed foot care., Reminded to get yearly retinal exam. - Hemoglobin A1c   Hyperlipidemia -continue medications, check lipids, decrease fatty foods, increase activity.  - Lipid panel  Morbid Obesity (BMI 30+ with OSA and other co morbidities) - follow up 3 months for progress monitoring - increase veggies, decrease carbs - long discussion about weight loss, diet, and exercise - weight goal of <200 lb set  Prediabetes Discussed disease and risks Discussed diet/exercise, weight management  A1C   Vitamin D deficiency At goal at recent check; continue to recommend supplementation for goal of 70-100 Check vitamin D level  Medication management - Magnesium  Left foot drop Monitor, improves with increased exercise Start regular exercise follow up ortho PRN  myositis MONITOR  Obstructive sleep apnea Continue CPAP  Erectile dysfunction Continue PRN cialis    Major depression in remission Continue zoloft, wellbutrin- discussed symptoms of serotonin syndrome.  Recommend decrease zoloft from 200 mg to 150 mg daily Lifestyle discussed: diet/exerise, sleep hygiene, stress management, hydration    Discussed med's  effects and SE's. Screening labs and tests as requested with regular follow-up as recommended. Over 40 minutes of exam, counseling, chart review and critical decision making was performed Future Appointments  Date Time Provider [place]  10/05/2020  9:30 AM Deneise Lever, MD [facility] None  12/07/2020  9:30 AM Unk Pinto, MD [facility] None  03/15/2021 10:00 AM Liane Comber, NP [facility] None  08/29/2021 11:30 AM Liane Comber, NP [facility] None    _____________________________________________________________________________ HPI Patient presents for AWV and for 3 month follow up on htn, hyperlipidemia, prediabetes, hypothyroidism, obesity, vit D def.   He has sleep apnea on CPAP.   He has hx of miositis and mild L foot drop/weakness and follows with ortho PRN, declined surgery, [facility] Will occasionally use cane for support.   He has dx of depression in remission on zoloft 200 mg daily, and more recently added wellbutrin 150 mg and doing well. No symptoms of serotonin other than sweating which is unchanged for the patient.   BMI is Body mass index is 34.36 kg/m., he has been working on diet, was going to the gym/[facility] but reduced with pandemic, is very active in yard, will plan to restart more exercise.  Wt Readings from Last 3 Encounters:  08/29/20 226 lb (102.5 kg)  05/16/20 219 lb (99.3 kg)  01/26/20 221 lb (100.2 kg)   He has not been checking BP at home, he is on metoprolol 25mg  BID, olmesartan 40 mg, HCTZ 12.5 mg, today their BP is BP: 132/88  He has a history of LVH with normal EF, last echo 2011, and normal cardiolite 2014. He does not workout.  He denies chest pain, shortness of breath, dizziness.   He is on cholesterol medication and denies myalgias, had intolerant to  statins due to elevated CPK so on zetia and lopid.  His cholesterol is at goal. States has cut back significantly on cheese. The cholesterol last visit was:    Lab Results  Component Value Date   CHOL 163 05/16/2020   HDL 36 (L) 05/16/2020   LDLCALC 88 05/16/2020   TRIG 324 (H) 05/16/2020   CHOLHDL 4.5 05/16/2020   He has been working on diet and exercise for prediabetes,  and denies paresthesia of the feet, polydipsia, polyuria and visual disturbances. Last A1C in the office was:  Lab Results  Component Value Date   HGBA1C 5.9 (H) 01/26/2020   Patient is on Vitamin D supplement.   Lab Results  Component Value Date   VD25OH 67 01/26/2020    He is on thyroid medication. His medication was not changed last visit.   Lab Results  Component Value Date   TSH 0.86 05/16/2020    He was following with Dr. Risa Grill years ago for elevated PSA while on testosterone, recently off supplement and normalized  Lab Results  Component Value Date   PSA 0.4 01/26/2020    Current Medications:   Current Outpatient Medications (Endocrine & Metabolic):  .  levothyroxine (SYNTHROID) 50 MCG tablet, TAKE 1 TABLET BY MOUTH EVERY DAY  Current Outpatient Medications (Cardiovascular):  Marland Kitchen  EPINEPHrine (EPIPEN 2-PAK) 0.3 mg/0.3 mL IJ SOAJ injection, Inject 0.3 mLs (0.3 mg total) into the muscle once. .  ezetimibe (ZETIA) 10 MG tablet, Take  1 tablet  Daily  for Cholesterol .  gemfibrozil (LOPID) 600 MG tablet, TAKE 1 TABLET TWICE A DAY FOR CHOLESTEROL .  hydrochlorothiazide (HYDRODIURIL) 12.5 MG tablet, TAKE 1 TABLET BY MOUTH EVERY DAY .  metoprolol tartrate (LOPRESSOR) 25 MG tablet, TAKE 1 TABLET TWICE A DAY FOR BP .  olmesartan (BENICAR) 40 MG tablet, Take  1 tablet  Daily  for BP   Current Outpatient Medications (Analgesics):  .  aspirin 325 MG tablet, Take 325 mg by mouth daily.   Current Outpatient Medications (Other):  Marland Kitchen  Ascorbic Acid (VITAMIN C) 1000 MG tablet, Take 1,000 mg by mouth daily. .  B Complex Vitamins (B COMPLEX PO), Take 1 tablet by mouth See admin instructions. Takes M,W,F .  buPROPion (WELLBUTRIN XL) 150 MG 24 hr tablet, TAKE 1 TABLET  EVERY MORNING FOR MOOD, FOCUS & CONCENTRATION .  Flaxseed, Linseed, (FLAX SEED OIL) 1000 MG CAPS, Take 2,000 mg by mouth daily. Marland Kitchen  MAGNESIUM PO, Take 750 mg by mouth daily.  .  Multiple Vitamin (MULTIVITAMIN) capsule, Take by mouth. .  sertraline (ZOLOFT) 100 MG tablet, TAKE 2 TABLETS (200 MG) DAILY FOR MOOD  Health Maintenance:  Immunization History  Administered Date(s) Administered  . Influenza Split 03/06/2012, 02/24/2013, 03/18/2014  . Influenza, High Dose Seasonal PF 02/12/2016, 04/09/2017, 02/15/2019, 03/21/2020  . Influenza-Unspecified 03/03/2015, 02/04/2018  . PFIZER(Purple Top)SARS-COV-2 Vaccination 07/08/2019, 08/12/2019, 03/14/2020  . PPD Test 07/26/2013  . Pneumococcal Conjugate-13 02/12/2016  . Pneumococcal Polysaccharide-23 07/26/2013, 01/26/2019  . Pneumococcal-Unspecified 05/04/2004  . Td 04/07/2019  . Tdap 07/24/2011  . Zoster 12/02/2013  . Zoster Recombinat (Shingrix) 01/28/2019, 04/20/2019    Influenza 01/2020 TDAP 2020 Pneumovax 2015 Prevnar: 2017 Shingles vaccine: 2015, 2020 Covid 19 completed-  3/3  Colonoscopy 03/14/2017 - 5 year follow up   ECHO 08/06/09 Neg cardiolite 2014 CXR 2017 U/S ABD 08/01/11- FATTY LIVER   EYE Exam: Dr. Marland Kitchen New provider, last 10/2019 Dental exam: Dr. Conley Canal, last 2022  Patient Care Team: Unk Pinto, MD as PCP -  General (Internal Medicine) Irene Shipper, MD as Consulting Physician (Gastroenterology) Dyke Maes, OD (Optometry)  Medical History:  Past Medical History:  Diagnosis Date  . Cardiomegaly   . Colon polyp   . Depression   . Fatty liver disease, nonalcoholic   . Gallstones   . Hypertension   . Hypogonadism male   . Mixed hyperlipidemia   . Obesity   . Pancreas (digestive gland) works poorly   . Prediabetes   . Sleep apnea   . Testosterone deficiency 08/03/2009  . Thyroid disease   . Vitamin D deficiency    Allergies Allergies  Allergen Reactions  . Ace Inhibitors Other (See Comments)     cough  . Bee Venom Other (See Comments)    SURGICAL HISTORY He  has a past surgical history that includes Cholecystectomy (1976); Appendectomy (1964); Ankle fracture surgery (Left, 1986); Carpal tunnel release (Bilateral, 1996); Tibia IM nail insertion (Right, 03/21/2016); Colonoscopy; and Polypectomy. FAMILY HISTORY His family history includes Diabetes in his father, mother, and sister; Heart disease in his father; Hypertension in his father. SOCIAL HISTORY He  reports that he quit smoking about 46 years ago. His smoking use included cigarettes. He has a 14.00 pack-year smoking history. He has never used smokeless tobacco. He reports that he does not drink alcohol and does not use drugs.    MEDICARE WELLNESS OBJECTIVES: Physical activity: Current Exercise Habits: The patient does not participate in regular exercise at present, Exercise limited by: None identified Cardiac risk factors: Cardiac Risk Factors include: advanced age (>69men, >73 women);dyslipidemia;hypertension;obesity (BMI >30kg/m2);smoking/ tobacco exposure;sedentary lifestyle;male gender Depression/mood screen:   Depression screen [facility] 2/9 08/29/2020  Decreased Interest 0  Down, Depressed, Hopeless 1  PHQ - 2 Score 1  Altered sleeping -  Tired, decreased energy -  Change in appetite -  Feeling bad or failure about yourself  -  Trouble concentrating -  Moving slowly or fidgety/restless -  Suicidal thoughts -  PHQ-9 Score -  Difficult doing work/chores -    ADLs:  In your present state of health, do you have any difficulty performing the following activities: 08/29/2020  Hearing? N  Vision? N  Difficulty concentrating or making decisions? N  Walking or climbing stairs? N  Dressing or bathing? N  Doing errands, shopping? N  Some recent data might be hidden     Cognitive Testing  Alert? Yes  Normal Appearance?Yes  Oriented to person? Yes  Place? Yes   Time? Yes  Recall of three objects?  Yes  Can perform simple  calculations? Yes  Displays appropriate judgment?Yes  Can read the correct time from a watch face?Yes  EOL planning: Does Patient Have a Medical Advance Directive?: No Would patient like information on creating a medical advance directive?: No - Patient declined   Review of Systems:  Review of Systems  Constitutional: Negative.  Negative for malaise/fatigue and weight loss.  HENT: Negative.  Negative for hearing loss and tinnitus.   Eyes: Negative.  Negative for blurred vision and double vision.  Respiratory: Negative.  Negative for cough, sputum production, shortness of breath and wheezing.   Cardiovascular: Negative.  Negative for chest pain, palpitations, orthopnea, claudication, leg swelling and PND.  Gastrointestinal: Negative.  Negative for abdominal pain, blood in stool, constipation, diarrhea, heartburn, melena, nausea and vomiting.  Genitourinary: Negative.   Musculoskeletal: Negative.  Negative for falls, joint pain and myalgias.  Skin: Negative.  Negative for rash.  Neurological: Positive for focal weakness (chronic left foot drop, unchanged). Negative  for dizziness, tingling, sensory change, weakness and headaches.  Endo/Heme/Allergies: Negative.  Negative for polydipsia.  Psychiatric/Behavioral: Negative.  Negative for depression, memory loss, substance abuse and suicidal ideas. The patient is not nervous/anxious and does not have insomnia.   All other systems reviewed and are negative.   Physical Exam: Estimated body mass index is 34.36 kg/m as calculated from the following:   Height as of 01/26/20: 5\' 8"  (1.727 m).   Weight as of this encounter: 226 lb (102.5 kg). BP 132/88   Pulse (!) 55   Temp (!) 97.3 F (36.3 C)   Wt 226 lb (102.5 kg)   SpO2 97%   BMI 34.36 kg/m  General Appearance: Well nourished, in no apparent distress.  Eyes: PERRLA, EOMs, conjunctiva no swelling or erythema, normal fundi and vessels.  Sinuses: No Frontal/maxillary tenderness  ENT/Mouth:  Ext aud canals impacted bilaterally with cerumen, normal light reflex with TMs without erythema, bulging. Good dentition. No erythema, swelling, or exudate on post pharynx. Tonsils not swollen or erythematous. Hearing normal.  Neck: Supple, thyroid normal. No bruits  Respiratory: Respiratory effort normal, BS equal bilaterally without rales, rhonchi, wheezing or stridor.  Cardio: RRR without murmurs, rubs or gallops. Brisk peripheral pulses without edema.  Chest: symmetric, with normal excursions and percussion.  Abdomen: Soft, obese, nontender, with large vertical and right horizontal scar no guarding, rebound, hernias, masses, or organomegaly.  Lymphatics: Non tender without lymphadenopathy.  Genitourinary: defer Musculoskeletal: Full ROM all peripheral extremities, 5/5 strength, except left foot extension 4/5, and mildly antalgic gait.  Skin:   Warm, dry without rashes, lesions, ecchymosis. Neuro: Cranial nerves intact, reflexes decreased bilateral legs, worse left than right with decrease dorsiflexion/plantar on left. Normal muscle tone, no cerebellar symptoms. Sensation intact.  Psych: Awake and oriented X 3, normal affect, Insight and Judgment appropriate.    Medicare Attestation I have personally reviewed: The patient's medical and social history Their use of alcohol, tobacco or illicit drugs Their current medications and supplements The patient's functional ability including ADLs,fall risks, home safety risks, cognitive, and hearing and visual impairment Diet and physical activities Evidence for depression or mood disorders  The patient's weight, height, BMI, and visual acuity have been recorded in the chart.  I have made referrals, counseling, and provided education to the patient based on review of the above and I have provided the patient with a written personalized care plan for preventive services.      Gorden Harms Kailena Lubas 12:09 PM [facility]

## 2020-08-29 ENCOUNTER — Encounter: Payer: Self-pay | Admitting: Adult Health

## 2020-08-29 ENCOUNTER — Other Ambulatory Visit: Payer: Self-pay

## 2020-08-29 ENCOUNTER — Ambulatory Visit (INDEPENDENT_AMBULATORY_CARE_PROVIDER_SITE_OTHER): Payer: PPO | Admitting: Adult Health

## 2020-08-29 VITALS — BP 132/88 | HR 55 | Temp 97.3°F | Wt 226.0 lb

## 2020-08-29 DIAGNOSIS — E559 Vitamin D deficiency, unspecified: Secondary | ICD-10-CM

## 2020-08-29 DIAGNOSIS — K760 Fatty (change of) liver, not elsewhere classified: Secondary | ICD-10-CM

## 2020-08-29 DIAGNOSIS — I10 Essential (primary) hypertension: Secondary | ICD-10-CM

## 2020-08-29 DIAGNOSIS — Z0000 Encounter for general adult medical examination without abnormal findings: Secondary | ICD-10-CM

## 2020-08-29 DIAGNOSIS — R7309 Other abnormal glucose: Secondary | ICD-10-CM

## 2020-08-29 DIAGNOSIS — R6889 Other general symptoms and signs: Secondary | ICD-10-CM

## 2020-08-29 DIAGNOSIS — E039 Hypothyroidism, unspecified: Secondary | ICD-10-CM

## 2020-08-29 DIAGNOSIS — Z0001 Encounter for general adult medical examination with abnormal findings: Secondary | ICD-10-CM

## 2020-08-29 DIAGNOSIS — E782 Mixed hyperlipidemia: Secondary | ICD-10-CM

## 2020-08-29 DIAGNOSIS — N529 Male erectile dysfunction, unspecified: Secondary | ICD-10-CM

## 2020-08-29 DIAGNOSIS — Z79899 Other long term (current) drug therapy: Secondary | ICD-10-CM | POA: Diagnosis not present

## 2020-08-29 DIAGNOSIS — M609 Myositis, unspecified: Secondary | ICD-10-CM

## 2020-08-29 DIAGNOSIS — M21372 Foot drop, left foot: Secondary | ICD-10-CM

## 2020-08-29 DIAGNOSIS — G4733 Obstructive sleep apnea (adult) (pediatric): Secondary | ICD-10-CM

## 2020-08-29 DIAGNOSIS — F325 Major depressive disorder, single episode, in full remission: Secondary | ICD-10-CM | POA: Diagnosis not present

## 2020-08-29 MED ORDER — SERTRALINE HCL 100 MG PO TABS: ORAL_TABLET | ORAL | 3 refills | Status: DC

## 2020-08-29 NOTE — Patient Instructions (Signed)
[name] [name] , Thank you for taking time to come for your Medicare Wellness Visit. I appreciate your ongoing commitment to your health goals. Please review the following plan we discussed and let me know if I can assist you in the future.   These are the goals we discussed: Goals    . Blood Pressure < 130/80    . Exercise 150 min/wk Moderate Activity    . Weight (lb) < 200 lb (90.7 kg)       This is a list of the screening recommended for you and due dates:  Health Maintenance  Topic Date Due  . Flu Shot  12/25/2020  . Colon Cancer Screening  03/14/2022  . Tetanus Vaccine  04/06/2029  . COVID-19 Vaccine  Completed  .  Hepatitis C: One time screening is recommended by Center for Disease Control  (CDC) for  adults born from [birth year] through 1965.   Completed  . Pneumonia vaccines  Completed  . HPV Vaccine  Aged [employer] Information Description of Services Cost  A Matter of Balance Class locations vary. Call [place] on Aging for more information.  http://dawson-may.com/ [phone] 8-Session program addressing the fear of falling and increasing activity levels of older adults Free to minimal cost  A.C.T. By [employer] [address], [place], [place] [address].  BetaBlues.dk [phone]  Personal training, gym, classes including Silver Sneakers* and ACTion for Aging Adults Fee-based  A.H.O.Y. (Add Health to [place]) Airs on Time [employer] 13, M-F at [place]: [employer],  [place] [facility] [place],  [place], [place] [facility], [address] [place] [facility], [place], [place], [place] [address]  [place] Location: Sharrell Ku. [employer] [place]      [phone]  [phone]  [phone]  [phone]  [phone]  [phone]  [phone]  [phone]  [phone]  [phone]    [phone] A total-body conditioning class for adults 71 and older; designed to increase muscular strength, endurance, range of movement, flexibility, balance, agility and coordination Free  [facility] [place], [place] [address] [place]      [address]      [phone]      Pilate's class for individualsreturning to exercise after an injury, before or after surgery or for individuals with complex musculoskeletal issues; designed to improve strength, balance , flexibility      $15/class  [place] [address] [place], [place] [address] [email or web address] Point classes for beginners to advanced [place], [place] [address] [email or web address] Baxter Chair Exercises Free, ages 69 and older; Ages 42-59 fee based  Marvia Pickles, [facility] [address] [place], [place] [address] Seniorcenter_1 .Beverlee Nims [phone]  A.H.O.Y. Tai Chi Fee-based Donation based or free  [place] Class locations vary.  Call or email Angela Burke or view website for more information. [email or web address] [email or web address].com.cy.html [phone] Ongoing classes at local  YMCAs and gyms Fee-based  Silver Sneakers A.C.T. By [employer]: [place] [phone]  [phone] Classes designed for older adults who want to improve their strength, flexibility, balance and endurance.   Silver sneakers is covered by some insurance plans and includes a fitness center membership at participating locations. Find out more by calling [phone] or visiting [email or web address] Covered by some insurance plans  [facility] [place] [phone] A.H.O.Y., fitness room, personal training, fitness classes for injury prevention, strength, balance, flexibility, water fitness classes Ages 55+: $62 for 6 months; Ages 56-54: $22 for 6 months  Tai Chi for Everybody [facility] [address] [place], [place] [address] Taichiforeverybody_[email or web address] .Patsi Sears [phone] Tai Chi classes for beginners to advanced; geared for seniors Donation Based      [employer] (Helpling Others Participate in Exercise     Loyal Gambler. Rosana Hoes, PhD, [place] [email or web address] [place]     [phone]     A comprehensive fitness program for adults.  The program paris senior-level undergraduates Kinesiology students with adults who desire to learn how to exercise safely.  Includes a structural exercise class focusing on functional fitnesss     $100/semester in fall and spring; $75 in summer (no trainers)    *Silver Sneakers is covered by some Personal assistant and includes a  Radio producer at participating locations.  Find out more by calling [phone] or visiting [email or web address]  For additional health and human services resources for senior adults, please contact SeniorLine at [phone] in [place] and [place] at [phone] in all other areas.

## 2020-08-30 LAB — CBC WITH DIFFERENTIAL/PLATELET
Absolute Monocytes: 608 cells/uL (ref 200–950)
Basophils Absolute: 39 cells/uL (ref 0–200)
Basophils Relative: 1 %
Eosinophils Absolute: 211 cells/uL (ref 15–500)
Eosinophils Relative: 5.4 %
HCT: 44.1 % (ref 38.5–50.0)
Hemoglobin: 14.7 g/dL (ref 13.2–17.1)
Lymphs Abs: 1006 cells/uL (ref 850–3900)
MCH: 30.6 pg (ref 27.0–33.0)
MCHC: 33.3 g/dL (ref 32.0–36.0)
MCV: 91.7 fL (ref 80.0–100.0)
MPV: 10.7 fL (ref 7.5–12.5)
Monocytes Relative: 15.6 %
Neutro Abs: 2036 cells/uL (ref 1500–7800)
Neutrophils Relative %: 52.2 %
Platelets: 273 10*3/uL (ref 140–400)
RBC: 4.81 10*6/uL (ref 4.20–5.80)
RDW: 14 % (ref 11.0–15.0)
Total Lymphocyte: 25.8 %
WBC: 3.9 10*3/uL (ref 3.8–10.8)

## 2020-08-30 LAB — COMPLETE METABOLIC PANEL WITH GFR
AG Ratio: 1.8 (calc) (ref 1.0–2.5)
ALT: 43 U/L (ref 9–46)
AST: 46 U/L — ABNORMAL HIGH (ref 10–35)
Albumin: 4.6 g/dL (ref 3.6–5.1)
Alkaline phosphatase (APISO): 49 U/L (ref 35–144)
BUN: 21 mg/dL (ref 7–25)
CO2: 27 mmol/L (ref 20–32)
Calcium: 10.5 mg/dL — ABNORMAL HIGH (ref 8.6–10.3)
Chloride: 105 mmol/L (ref 98–110)
Creat: 1.07 mg/dL (ref 0.70–1.25)
GFR, Est African American: 82 mL/min/{1.73_m2} (ref >=60)
GFR, Est Non African American: 70 mL/min/{1.73_m2} (ref >=60)
Globulin: 2.5 g/dL (calc) (ref 1.9–3.7)
Glucose, Bld: 89 mg/dL (ref 65–99)
Potassium: 5.3 mmol/L (ref 3.5–5.3)
Sodium: 143 mmol/L (ref 135–146)
Total Bilirubin: 0.4 mg/dL (ref 0.2–1.2)
Total Protein: 7.1 g/dL (ref 6.1–8.1)

## 2020-08-30 LAB — HEMOGLOBIN A1C
Hgb A1c MFr Bld: 6 % of total Hgb — ABNORMAL HIGH (ref <5.7)
Mean Plasma Glucose: 126 mg/dL
eAG (mmol/L): 7 mmol/L

## 2020-08-30 LAB — MAGNESIUM: Magnesium: 2.3 mg/dL (ref 1.5–2.5)

## 2020-08-30 LAB — VITAMIN D 25 HYDROXY (VIT D DEFICIENCY, FRACTURES): Vit D, 25-Hydroxy: 79 ng/mL (ref 30–100)

## 2020-08-30 LAB — LIPID PANEL
Cholesterol: 149 mg/dL (ref <200)
HDL: 30 mg/dL — ABNORMAL LOW (ref >=40)
LDL Cholesterol (Calc): 76 mg/dL (calc)
Non-HDL Cholesterol (Calc): 119 mg/dL (calc) (ref <130)
Total CHOL/HDL Ratio: 5 (calc) — ABNORMAL HIGH (ref <5.0)
Triglycerides: 353 mg/dL — ABNORMAL HIGH (ref <150)

## 2020-08-30 LAB — TSH: TSH: 0.98 mIU/L (ref 0.40–4.50)

## 2020-08-31 ENCOUNTER — Other Ambulatory Visit: Payer: Self-pay

## 2020-08-31 MED ORDER — SERTRALINE HCL 100 MG PO TABS: ORAL_TABLET | ORAL | 3 refills | Status: DC

## 2020-10-04 ENCOUNTER — Encounter (HOSPITAL_COMMUNITY): Payer: Self-pay

## 2020-10-04 ENCOUNTER — Emergency Department (HOSPITAL_COMMUNITY): Payer: PPO

## 2020-10-04 ENCOUNTER — Observation Stay (HOSPITAL_COMMUNITY)
Admission: EM | Admit: 2020-10-04 | Discharge: 2020-10-05 | Disposition: A | Discharge status: Home / Self Care | Payer: PPO | Attending: Internal Medicine | Admitting: Internal Medicine

## 2020-10-04 ENCOUNTER — Other Ambulatory Visit: Payer: Self-pay

## 2020-10-04 DIAGNOSIS — R7303 Prediabetes: Secondary | ICD-10-CM | POA: Insufficient documentation

## 2020-10-04 DIAGNOSIS — Z87891 Personal history of nicotine dependence: Secondary | ICD-10-CM | POA: Diagnosis not present

## 2020-10-04 DIAGNOSIS — Z20822 Contact with and (suspected) exposure to covid-19: Secondary | ICD-10-CM | POA: Diagnosis not present

## 2020-10-04 DIAGNOSIS — S2249XA Multiple fractures of ribs, unspecified side, initial encounter for closed fracture: Secondary | ICD-10-CM

## 2020-10-04 DIAGNOSIS — I10 Essential (primary) hypertension: Secondary | ICD-10-CM | POA: Insufficient documentation

## 2020-10-04 DIAGNOSIS — E782 Mixed hyperlipidemia: Secondary | ICD-10-CM | POA: Diagnosis present

## 2020-10-04 DIAGNOSIS — R0902 Hypoxemia: Secondary | ICD-10-CM | POA: Diagnosis not present

## 2020-10-04 DIAGNOSIS — R4781 Slurred speech: Secondary | ICD-10-CM | POA: Diagnosis not present

## 2020-10-04 DIAGNOSIS — R4020 Unspecified coma: Secondary | ICD-10-CM | POA: Diagnosis not present

## 2020-10-04 DIAGNOSIS — S2242XA Multiple fractures of ribs, left side, initial encounter for closed fracture: Secondary | ICD-10-CM | POA: Diagnosis not present

## 2020-10-04 DIAGNOSIS — R55 Syncope and collapse: Secondary | ICD-10-CM

## 2020-10-04 DIAGNOSIS — E039 Hypothyroidism, unspecified: Secondary | ICD-10-CM | POA: Insufficient documentation

## 2020-10-04 DIAGNOSIS — Z7982 Long term (current) use of aspirin: Secondary | ICD-10-CM | POA: Insufficient documentation

## 2020-10-04 DIAGNOSIS — W010XXA Fall on same level from slipping, tripping and stumbling without subsequent striking against object, initial encounter: Secondary | ICD-10-CM | POA: Insufficient documentation

## 2020-10-04 DIAGNOSIS — I491 Atrial premature depolarization: Secondary | ICD-10-CM | POA: Diagnosis not present

## 2020-10-04 DIAGNOSIS — Z79899 Other long term (current) drug therapy: Secondary | ICD-10-CM | POA: Insufficient documentation

## 2020-10-04 DIAGNOSIS — R109 Unspecified abdominal pain: Secondary | ICD-10-CM | POA: Diagnosis not present

## 2020-10-04 DIAGNOSIS — W19XXXA Unspecified fall, initial encounter: Secondary | ICD-10-CM

## 2020-10-04 LAB — CBC WITH DIFFERENTIAL/PLATELET
Abs Immature Granulocytes: 0.07 10*3/uL (ref 0.00–0.07)
Basophils Absolute: 0 10*3/uL (ref 0.0–0.1)
Basophils Relative: 0 %
Eosinophils Absolute: 0.1 10*3/uL (ref 0.0–0.5)
Eosinophils Relative: 1 %
HCT: 42.3 % (ref 39.0–52.0)
Hemoglobin: 13.8 g/dL (ref 13.0–17.0)
Immature Granulocytes: 1 %
Lymphocytes Relative: 8 %
Lymphs Abs: 0.8 10*3/uL (ref 0.7–4.0)
MCH: 31.1 pg (ref 26.0–34.0)
MCHC: 32.6 g/dL (ref 30.0–36.0)
MCV: 95.3 fL (ref 80.0–100.0)
Monocytes Absolute: 0.8 10*3/uL (ref 0.1–1.0)
Monocytes Relative: 7 %
Neutro Abs: 8.4 10*3/uL — ABNORMAL HIGH (ref 1.7–7.7)
Neutrophils Relative %: 83 %
Platelets: 157 10*3/uL (ref 150–400)
RBC: 4.44 MIL/uL (ref 4.22–5.81)
RDW: 13.6 % (ref 11.5–15.5)
WBC: 10.1 10*3/uL (ref 4.0–10.5)
nRBC: 0.2 % (ref 0.0–0.2)

## 2020-10-04 LAB — BASIC METABOLIC PANEL
Anion gap: 9 (ref 5–15)
BUN: 26 mg/dL — ABNORMAL HIGH (ref 8–23)
CO2: 27 mmol/L (ref 22–32)
Calcium: 9.8 mg/dL (ref 8.9–10.3)
Chloride: 105 mmol/L (ref 98–111)
Creatinine, Ser: 1 mg/dL (ref 0.61–1.24)
GFR, Estimated: >60 mL/min (ref >60)
Glucose, Bld: 133 mg/dL — ABNORMAL HIGH (ref 70–99)
Potassium: 3.8 mmol/L (ref 3.5–5.1)
Sodium: 141 mmol/L (ref 135–145)

## 2020-10-04 MED ORDER — ENOXAPARIN SODIUM 40 MG/0.4ML IJ SOSY
40.0000 mg | PREFILLED_SYRINGE | INTRAMUSCULAR | Status: DC
  Administered 2020-10-04: 40 mg via SUBCUTANEOUS
  Filled 2020-10-04: qty 0.4

## 2020-10-04 MED ORDER — ACETAMINOPHEN 500 MG PO TABS: 1000.0000 mg | ORAL_TABLET | Freq: Four times a day (QID) | ORAL | Status: DC | PRN

## 2020-10-04 MED ORDER — ONDANSETRON HCL 4 MG/2ML IJ SOLN: 4.0000 mg | Freq: Four times a day (QID) | INTRAMUSCULAR | Status: DC | PRN

## 2020-10-04 MED ORDER — SENNOSIDES-DOCUSATE SODIUM 8.6-50 MG PO TABS: 1.0000 | ORAL_TABLET | Freq: Every evening | ORAL | Status: DC | PRN

## 2020-10-04 MED ORDER — DICLOFENAC EPOLAMINE 1.3 % EX PTCH
1.0000 | MEDICATED_PATCH | Freq: Two times a day (BID) | CUTANEOUS | Status: DC
  Administered 2020-10-04 – 2020-10-05 (×2): 1 via TRANSDERMAL
  Filled 2020-10-04 (×3): qty 1

## 2020-10-04 MED ORDER — SERTRALINE HCL 50 MG PO TABS
100.0000 mg | ORAL_TABLET | Freq: Every day | ORAL | Status: DC
  Administered 2020-10-05: 100 mg via ORAL
  Filled 2020-10-04: qty 2

## 2020-10-04 MED ORDER — EZETIMIBE 10 MG PO TABS
10.0000 mg | ORAL_TABLET | Freq: Every day | ORAL | Status: DC
  Administered 2020-10-05: 10 mg via ORAL
  Filled 2020-10-04: qty 1

## 2020-10-04 MED ORDER — HYDROCODONE-ACETAMINOPHEN 5-325 MG PO TABS
1.0000 | ORAL_TABLET | ORAL | Status: DC | PRN
Start: 2020-10-04 — End: 2020-10-05

## 2020-10-04 MED ORDER — LEVOTHYROXINE SODIUM 50 MCG PO TABS
50.0000 ug | ORAL_TABLET | Freq: Every day | ORAL | Status: DC
  Administered 2020-10-05: 50 ug via ORAL
  Filled 2020-10-04: qty 1

## 2020-10-04 MED ORDER — GEMFIBROZIL 600 MG PO TABS
600.0000 mg | ORAL_TABLET | Freq: Two times a day (BID) | ORAL | Status: DC
  Administered 2020-10-05: 600 mg via ORAL
  Filled 2020-10-04 (×4): qty 1

## 2020-10-04 MED ORDER — HYDROMORPHONE HCL 1 MG/ML IJ SOLN
1.0000 mg | Freq: Once | INTRAMUSCULAR | Status: AC
  Administered 2020-10-04: 1 mg via INTRAVENOUS
  Filled 2020-10-04: qty 1

## 2020-10-04 MED ORDER — SODIUM CHLORIDE 0.9% FLUSH
3.0000 mL | Freq: Two times a day (BID) | INTRAVENOUS | Status: DC
  Administered 2020-10-04 – 2020-10-05 (×2): 3 mL via INTRAVENOUS

## 2020-10-04 MED ORDER — ONDANSETRON HCL 4 MG PO TABS: 4.0000 mg | ORAL_TABLET | Freq: Four times a day (QID) | ORAL | Status: DC | PRN

## 2020-10-04 MED ORDER — ACETAMINOPHEN 650 MG RE SUPP: 650.0000 mg | Freq: Four times a day (QID) | RECTAL | Status: DC | PRN

## 2020-10-04 MED ORDER — BUPROPION HCL ER (XL) 150 MG PO TB24
150.0000 mg | ORAL_TABLET | Freq: Every day | ORAL | Status: DC
  Administered 2020-10-05: 150 mg via ORAL
  Filled 2020-10-04: qty 1

## 2020-10-04 NOTE — ED Notes (Signed)
Pt became diaphoretic after receiving dilaudid, bp dropped into 90s, but now sbp is in 120s, provider made aware

## 2020-10-04 NOTE — ED Triage Notes (Signed)
Pt comes from home, tripped and fell on left side- c/o left flank pain, minor abrasion on left arm. Took 1000mg  tylenol at 1330 and rested. Told EMS he got up, felt dizzy and sat down and had syncopal episode witnessed by wife. 500 NS & 4mg  zofran given via EMS  bp -124/74 HR- 54

## 2020-10-04 NOTE — Progress Notes (Deleted)
HPI Male former smoker followed for OSA complicated by obesity, HBP NPSG 08/25/95- Severe obstructive sleep apnea, AHI 58/hr. ---------------------------------------------------------------------------   10/06/19- 70 year old male former smoker followed for OSA, complicated by obesity, HBP, hypothyroid,  CPAP auto 5-15/ [employer] Download compliance 100%, AHI 4.1/ hr Body weight today 220 lbs Had 2 Phizer Covax He is pretty sure he and wife had Covid infection last winter. She later tested positive, he did not, but both sick at same time. Now fully resolved.  Doing well with CPAP, but not happy with [employer] billing so he gets his supplies on-line.   10/05/20-  70 year old male former smoker followed for OSA, complicated by obesity, HBP, hypothyroid,  CPAP auto 5-15/ [employer] Download- Body weight today-  Covid vax-  ROS- see HPI + = positive Constitutional:   No-   weight loss, night sweats, fevers, chills, fatigue, lassitude. HEENT:   No-  headaches, difficulty swallowing, tooth/dental problems, sore throat,       No-  sneezing, itching, ear ache, nasal congestion, post nasal drip,  CV:  No-   chest pain, orthopnea, PND, swelling in lower extremities, anasarca, dizziness, palpitations Resp: No-   shortness of breath with exertion or at rest.              No-   productive cough,  No non-productive cough,  No- coughing up of blood.              No-   change in color of mucus.  No- wheezing.   Skin: No-   rash or lesions. GI:  No-   heartburn, indigestion, abdominal pain, nausea, vomiting,  GU: . MS:  No-   joint pain or swelling.  Neuro-     nothing unusual Psych:  No- change in mood or affect. No depression or anxiety.  No memory loss.  OBJ General- Alert, Oriented, Affect-appropriate, Distress- none acute; +overweight. + Full beard Skin- rash-none, lesions- none, excoriation- none Lymphadenopathy- none Head- atraumatic            Eyes- Gross vision intact, PERRLA, conjunctivae clear  secretions            Ears- Hearing, canals-normal            Nose- Clear, no-Septal dev, mucus, polyps, erosion, perforation             Throat- Mallampati III-IV , mucosa , drainage- none, tonsils- atrophic Neck- flexible , trachea midline, no stridor , thyroid nl, carotid no bruit Chest - symmetrical excursion , unlabored           Heart/CV- RRR , no murmur , no gallop  , no rub, nl s1 s2                           - JVD- none , edema- none, stasis changes- none, varices- none           Lung- clear to P&A, wheeze- none, cough- none , dullness-none, rub- none           Chest wall-  Abd-  Br/ Gen/ Rectal- Not done, not indicated Extrem- cyanosis- none, clubbing, none, atrophy- none, strength- nl Neuro- grossly intact to observation

## 2020-10-04 NOTE — ED Notes (Signed)
ED TO INPATIENT HANDOFF REPORT  Name/Age/Gender [name] [name] 70 y.o. male  Code Status Code Status History    Date Active Date Inactive Code Status Order ID Comments User Context   03/20/2016 1859 03/23/2016 2041 Full Code [id number]  Elgergawy, Silver Huguenin, MD Inpatient   Advance Care Planning Activity    Questions for Most Recent Historical Code Status (Order [id number])       Home/SNF/Other Home  Chief Complaint Near syncope [R55]  Level of Care/Admitting Diagnosis ED Disposition    ED Disposition Condition [place]: [facility] [[id number]]  Level of Care: Telemetry [5]  Admit to tele based on following criteria: Eval of Syncope  Covid Evaluation: Asymptomatic Screening Protocol (No Symptoms)  Diagnosis: Near syncope [121975]  Admitting Physician: Lenore Cordia [[id number]]  Attending Physician: Lenore Cordia [[id number]]       Medical History Past Medical History:  Diagnosis Date  . Cardiomegaly   . Colon polyp   . Depression   . Fatty liver disease, nonalcoholic   . Gallstones   . Hypertension   . Hypogonadism male   . Mixed hyperlipidemia   . Obesity   . Pancreas (digestive gland) works poorly   . Prediabetes   . Sleep apnea   . Testosterone deficiency 08/03/2009  . Thyroid disease   . Vitamin D deficiency     Allergies Allergies  Allergen Reactions  . Ace Inhibitors Other (See Comments)    cough  . Bee Venom Other (See Comments)    IV Location/Drains/Wounds Patient Lines/Drains/Airways Status    Active Line/Drains/Airways    Name Placement date Placement time Site Days   Peripheral IV 10/04/20 Left Forearm 10/04/20  1553  Forearm  less than 1          Labs/Imaging Results for orders placed or performed during the hospital encounter of 10/04/20 (from the past 48 hour(s))  Basic metabolic panel     Status: Abnormal   Collection Time: 10/04/20  4:30 PM  Result Value Ref Range   Sodium 141 135 - 145  mmol/L   Potassium 3.8 3.5 - 5.1 mmol/L   Chloride 105 98 - 111 mmol/L   CO2 27 22 - 32 mmol/L   Glucose, Bld 133 (H) 70 - 99 mg/dL    Comment: Glucose reference range applies only to samples taken after fasting for at least 8 hours.   BUN 26 (H) 8 - 23 mg/dL   Creatinine, Ser 1.00 0.61 - 1.24 mg/dL   Calcium 9.8 8.9 - 10.3 mg/dL   GFR, Estimated >60 >60 mL/min    Comment: (NOTE) Calculated using the CKD-EPI Creatinine Equation (2021)    Anion gap 9 5 - 15    Comment: Performed at [facility], [place] [address], [place], [place] [address]  CBC with Differential     Status: Abnormal   Collection Time: 10/04/20  4:30 PM  Result Value Ref Range   WBC 10.1 4.0 - 10.5 K/uL   RBC 4.44 4.22 - 5.81 MIL/uL   Hemoglobin 13.8 13.0 - 17.0 g/dL   HCT 42.3 39.0 - 52.0 %   MCV 95.3 80.0 - 100.0 fL   MCH 31.1 26.0 - 34.0 pg   MCHC 32.6 30.0 - 36.0 g/dL   RDW 13.6 11.5 - 15.5 %   Platelets 157 150 - 400 K/uL    Comment: SPECIMEN CHECKED FOR CLOTS REPEATED TO VERIFY PLATELET COUNT CONFIRMED BY SMEAR  nRBC 0.2 0.0 - 0.2 %   Neutrophils Relative % 83 %   Neutro Abs 8.4 (H) 1.7 - 7.7 K/uL   Lymphocytes Relative 8 %   Lymphs Abs 0.8 0.7 - 4.0 K/uL   Monocytes Relative 7 %   Monocytes Absolute 0.8 0.1 - 1.0 K/uL   Eosinophils Relative 1 %   Eosinophils Absolute 0.1 0.0 - 0.5 K/uL   Basophils Relative 0 %   Basophils Absolute 0.0 0.0 - 0.1 K/uL   Immature Granulocytes 1 %   Abs Immature Granulocytes 0.07 0.00 - 0.07 K/uL    Comment: Performed at [facility], [place] [address], [place], [place] [address]   DG Ribs Unilateral W/Chest Left  Result Date: 10/04/2020 CLINICAL DATA:  Fall with rib pain.  Trip and fall on left side. EXAM: LEFT RIBS AND CHEST - 3+ VIEW COMPARISON:  None. FINDINGS: Displaced fractures of left lateral seventh, eighth, ninth, and tenth ribs. There is no evidence of pneumothorax or pleural effusion. Both lungs are clear. Heart size and  mediastinal contours are within normal limits. IMPRESSION: Displaced left lateral seventh, eighth, ninth, and tenth rib fractures. No pneumothorax or pulmonary complication. Electronically Signed   By: Keith Rake M.D.   On: 10/04/2020 16:28    Pending Labs Unresulted Labs (From admission, onward)          Start     Ordered   10/04/20 1646  SARS CORONAVIRUS 2 (TAT 6-24 HRS) Nasopharyngeal Nasopharyngeal Swab  (Tier 3 - Symptomatic/asymptomatic)  Once,   STAT       Question Answer Comment  Is this test for diagnosis or screening Screening   Symptomatic for COVID-19 as defined by CDC No   Hospitalized for COVID-19 No   Admitted to ICU for COVID-19 No   Previously tested for COVID-19 Yes   Resident in a congregate (group) care setting Unknown   Employed in healthcare setting Unknown   Has patient completed COVID vaccination(s) (2 doses of Pfizer/Moderna 1 dose of [employer]) Unknown      10/04/20 1645          Vitals/Pain Today's Vitals   10/04/20 1830 10/04/20 1836 10/04/20 1900 10/04/20 2000  BP: 121/73  119/76 122/82  Pulse: (!) 56  (!) 57 (!) 57  Resp:   16 12  Temp:    98.2 F (36.8 C)  TempSrc:    Oral  SpO2: 98%  95% 97%  Weight:      Height:      PainSc:  2   2     Isolation Precautions No active isolations  Medications Medications  diclofenac (FLECTOR) 1.3 % 1 patch (1 patch Transdermal Patch Applied 10/04/20 1836)  HYDROmorphone (DILAUDID) injection 1 mg (1 mg Intravenous Given 10/04/20 1808)    Mobility Ambulatory with cane

## 2020-10-04 NOTE — ED Notes (Signed)
[facility] called for transport. 

## 2020-10-04 NOTE — ED Notes (Addendum)
Attempted to call report to [facility], states they will call back

## 2020-10-04 NOTE — ED Notes (Signed)
flector patch applied to pt left rib/flank area

## 2020-10-04 NOTE — H&P (Signed)
History and Physical    [name] [name] BHA:[id number] DOB: 01-Oct-1950 DOA: 10/04/2020  PCP: Unk Pinto, MD  Patient coming from: Home via EMS  I have personally briefly reviewed patient's old medical records in [place]  Chief Complaint: Left flank pain after fall at home, near syncope  HPI: [name] [name] is a 70 y.o. male with medical history significant for hypertension, hyperlipidemia, sinus bradycardia, hypothyroidism, depression who presents to the ED for evaluation of left flank pain after a fall at home and near syncopal event.  Patient states he was working in the yard when he tripped over the rug lining of his flower bed.  He landed on his left flank flank area and has been having focal pain since his fall.  He says he went inside the house to lay down but felt uncomfortable.  He was sitting up when all of a sudden he became diaphoretic, nauseous, and had a near syncopal event.  His wife says his eyes were open and he was staring but not responding.  This lasted about 30-60 seconds before resolving on its own.  EMS were called and per EDP he was found to be hypotensive with BP 70/50.  He was brought to the ED for further evaluation.  Patient reports left flank pain with deep inspiration.  He denies any chest pain, palpitations, emesis, cough, abdominal pain, dysuria, or diarrhea.  He says he did take his usual medications this morning.  ED Course:  Initial vitals showed BP 127/79, pulse 49, RR 20, temp 98.4 F, SPO2 100% on room air.  Labs show WBC 10.1, hemoglobin 13.8, platelets 157,000, sodium 141, potassium 3.8, bicarb 27, BUN 26, creatinine 1.00, serum glucose 133.  SARS-CoV-2 PCR is obtained and pending.  Left ribs and chest x-ray shows displaced the left lateral seventh, eighth, ninth, and 10th rib fractures.  No evidence of pneumothorax or pleural effusion noted.  Patient was given IV Dilaudid.  Subsequently had transient drop in blood pressure with SBP into  the 90s.  EDP discussed with on-call surgery who recommended admission to [facility] for trauma surgery follow-up.  The hospitalist service was consulted to admit for further evaluation and management.  Review of Systems: All systems reviewed and are negative except as documented in history of present illness above.   Past Medical History:  Diagnosis Date  . Cardiomegaly   . Colon polyp   . Depression   . Fatty liver disease, nonalcoholic   . Gallstones   . Hypertension   . Hypogonadism male   . Mixed hyperlipidemia   . Obesity   . Pancreas (digestive gland) works poorly   . Prediabetes   . Sleep apnea   . Testosterone deficiency 08/03/2009  . Thyroid disease   . Vitamin D deficiency     Past Surgical History:  Procedure Laterality Date  . ANKLE FRACTURE SURGERY Left 1986  . APPENDECTOMY  1964  . CARPAL TUNNEL RELEASE Bilateral 1996  . CHOLECYSTECTOMY  1976  . COLONOSCOPY    . POLYPECTOMY    . TIBIA IM NAIL INSERTION Right 03/21/2016   Procedure: INTRAMEDULLARY (IM) NAIL TIBIAL;  Surgeon: Renette Butters, MD;  Location: [place];  Service: Orthopedics;  Laterality: Right;    Social History:  reports that he quit smoking about 46 years ago. His smoking use included cigarettes. He has a 14.00 pack-year smoking history. He has never used smokeless tobacco. He reports that he does not drink alcohol and does not use  drugs.  Allergies  Allergen Reactions  . Ace Inhibitors Other (See Comments)    cough  . Bee Venom Other (See Comments)    Family History  Problem Relation Age of Onset  . Diabetes Mother   . Diabetes Father        brother and sister  . Heart disease Father   . Hypertension Father   . Diabetes Sister   . Colon cancer Neg Hx   . Colon polyps Neg Hx   . Rectal cancer Neg Hx   . Stomach cancer Neg Hx   . Esophageal cancer Neg Hx      Prior to Admission medications   Medication Sig Start Date End Date Taking? Authorizing Provider  Ascorbic Acid  (VITAMIN C) 1000 MG tablet Take 1,000 mg by mouth daily.    [provider]  aspirin 325 MG tablet Take 325 mg by mouth daily.    [provider]  B Complex Vitamins (B COMPLEX PO) Take 1 tablet by mouth See admin instructions. Takes M,W,F    [provider]  buPROPion (WELLBUTRIN XL) 150 MG 24 hr tablet TAKE 1 TABLET EVERY MORNING FOR MOOD, FOCUS & CONCENTRATION 12/06/19   Unk Pinto, MD  EPINEPHrine (EPIPEN 2-PAK) 0.3 mg/0.3 mL IJ SOAJ injection Inject 0.3 mLs (0.3 mg total) into the muscle once. 03/06/14   Leonard Schwartz, MD  ezetimibe (ZETIA) 10 MG tablet Take  1 tablet  Daily  for Cholesterol 08/27/20   Unk Pinto, MD  Flaxseed, Linseed, (FLAX SEED OIL) 1000 MG CAPS Take 2,000 mg by mouth daily.    [provider]  gemfibrozil (LOPID) 600 MG tablet TAKE 1 TABLET TWICE A DAY FOR CHOLESTEROL 10/22/19   Unk Pinto, MD  hydrochlorothiazide (HYDRODIURIL) 12.5 MG tablet TAKE 1 TABLET BY MOUTH EVERY DAY 04/12/20   Liane Comber, NP  levothyroxine (SYNTHROID) 50 MCG tablet TAKE 1 TABLET BY MOUTH EVERY DAY 02/10/20   Vladimir Crofts, PA-C  MAGNESIUM PO Take 750 mg by mouth daily.     [provider]  metoprolol tartrate (LOPRESSOR) 25 MG tablet TAKE 1 TABLET TWICE A DAY FOR BP 10/08/19   Unk Pinto, MD  Multiple Vitamin (MULTIVITAMIN) capsule Take by mouth.    [provider]  olmesartan (BENICAR) 40 MG tablet Take  1 tablet  Daily  for BP 08/27/20   Unk Pinto, MD  sertraline (ZOLOFT) 100 MG tablet Take one tablet (100mg ) daily for mood. 08/31/20   Liane Comber, NP    Physical Exam: Vitals:   10/04/20 1805 10/04/20 1815 10/04/20 1830 10/04/20 1900  BP: 128/80 93/65 121/73 119/76  Pulse: 71 (!) 49 (!) 56 (!) 57  Resp: 20   16  Temp:      TempSrc:      SpO2: 96% 97% 98% 95%  Weight:      Height:       Constitutional: Resting supine in bed, NAD, calm, comfortable Eyes: PERRL, lids and conjunctivae normal ENMT:  Mucous membranes are moist. Posterior pharynx clear of any exudate or lesions.Normal dentition.  Neck: normal, supple, no masses. Respiratory: clear to auscultation bilaterally, no wheezing, no crackles. Normal respiratory effort. No accessory muscle use.  Cardiovascular: Regular rate and rhythm, no murmurs / rubs / gallops. No extremity edema. 2+ pedal pulses. Abdomen: no tenderness, no masses palpated. Bowel sounds positive.  Musculoskeletal: Left flank/posterior rib tenderness with diclofenac patch in place.  No clubbing / cyanosis. No joint deformity upper and lower extremities. Good ROM,  no contractures. Normal muscle tone.  Skin: no rashes, lesions, ulcers. No induration Neurologic: CN 2-12 grossly intact. Sensation intact. Strength 5/5 in all 4.  Psychiatric: Normal judgment and insight. Alert and oriented x 3. Normal mood.   Labs on Admission: I have personally reviewed following labs and imaging studies  CBC: Recent Labs  Lab 10/04/20 1630  WBC 10.1  NEUTROABS 8.4*  HGB 13.8  HCT 42.3  MCV 95.3  PLT 403   Basic Metabolic Panel: Recent Labs  Lab 10/04/20 1630  NA 141  K 3.8  CL 105  CO2 27  GLUCOSE 133*  BUN 26*  CREATININE 1.00  CALCIUM 9.8   GFR: Estimated Creatinine Clearance: 78.5 mL/min (by C-G formula based on SCr of 1 mg/dL). Liver Function Tests: No results for input(s): AST, ALT, ALKPHOS, BILITOT, PROT, ALBUMIN in the last 168 hours. No results for input(s): LIPASE, AMYLASE in the last 168 hours. No results for input(s): AMMONIA in the last 168 hours. Coagulation Profile: No results for input(s): INR, PROTIME in the last 168 hours. Cardiac Enzymes: No results for input(s): CKTOTAL, CKMB, CKMBINDEX, TROPONINI in the last 168 hours. BNP (last 3 results) No results for input(s): PROBNP in the last 8760 hours. HbA1C: No results for input(s): HGBA1C in the last 72 hours. CBG: No results for input(s): GLUCAP in the last 168 hours. Lipid Profile: No  results for input(s): CHOL, HDL, LDLCALC, TRIG, CHOLHDL, LDLDIRECT in the last 72 hours. Thyroid Function Tests: No results for input(s): TSH, T4TOTAL, FREET4, T3FREE, THYROIDAB in the last 72 hours. Anemia Panel: No results for input(s): VITAMINB12, FOLATE, FERRITIN, TIBC, IRON, RETICCTPCT in the last 72 hours. Urine analysis:    Component Value Date/Time   COLORURINE DARK YELLOW 01/26/2020 1456   APPEARANCEUR CLEAR 01/26/2020 1456   LABSPEC 1.024 01/26/2020 1456   PHURINE 6.0 01/26/2020 1456   GLUCOSEU NEGATIVE 01/26/2020 1456   HGBUR NEGATIVE 01/26/2020 1456   BILIRUBINUR NEGATIVE 01/01/2017 0943   KETONESUR NEGATIVE 01/26/2020 1456   PROTEINUR NEGATIVE 01/26/2020 1456   UROBILINOGEN 0.2 09/12/2014 1100   NITRITE NEGATIVE 01/26/2020 1456   LEUKOCYTESUR NEGATIVE 01/26/2020 1456    Radiological Exams on Admission: DG Ribs Unilateral W/Chest Left  Result Date: 10/04/2020 CLINICAL DATA:  Fall with rib pain.  Trip and fall on left side. EXAM: LEFT RIBS AND CHEST - 3+ VIEW COMPARISON:  None. FINDINGS: Displaced fractures of left lateral seventh, eighth, ninth, and tenth ribs. There is no evidence of pneumothorax or pleural effusion. Both lungs are clear. Heart size and mediastinal contours are within normal limits. IMPRESSION: Displaced left lateral seventh, eighth, ninth, and tenth rib fractures. No pneumothorax or pulmonary complication. Electronically Signed   By: Keith Rake M.D.   On: 10/04/2020 16:28    EKG: Not performed.  Assessment/Plan Principal Problem:   Near syncope Active Problems:   Hyperlipidemia   Essential hypertension   Hypothyroidism   Multiple fractures of ribs, left side, initial encounter for closed fracture   [name] [name] is a 70 y.o. male with medical history significant for hypertension, hyperlipidemia, sinus bradycardia, hypothyroidism, depression who is admitted for near syncope and multiple left rib fractures.  Near syncope: Per patient,  transient near syncopal/syncopal episode occurring after initial mechanical fall.  Suspect vasovagal although has had intermittent bradycardic episodes. -Monitor on telemetry -Check orthostatic vitals -Judicious analgesic use for pain control -Obtain echocardiogram -Holding home antihypertensives for now -PT eval  Left 7th-10th rib fractures, displaced: Occurring after initial mechanical fall.  No  evidence of pneumothorax or effusion on chest x-ray.  Start incentive spirometer, supplemental oxygen as needed, pain control as tolerated.  Admit to [facility] for trauma surgery evaluation.  Sinus bradycardia: Chronic mild bradycardia with baseline HR 50-60s.  Had transient drop in HR to 40s occurring after receiving IV Dilaudid per report.  Holding Lopressor, monitor on telemetry, obtain echocardiogram as above.  Hypertension: Holding home antihypertensives for now as above.  Hypothyroidism: Continue Synthroid.  Hyperlipidemia: Continue Zetia, gemfibrozil.  Depression: Continue sertraline, bupropion.  OSA: Continue CPAP nightly.  DVT prophylaxis: Lovenox Code Status: Full code, confirmed with patient Family Communication: Discussed with patient's spouse and son at bedside Disposition Plan: From home and likely discharge to home pending clinical progress Consults called: Trauma surgery Level of care: Telemetry Admission status:  Status is: Observation  The patient remains OBS appropriate and will d/c before 2 midnights.  Dispo: The patient is from: Home              Anticipated d/c is to: Home              Patient currently is not medically stable to d/c.   Difficult to place patient No  Zada Finders MD [facility]  If 7PM-7AM, please contact night-coverage [email or web address]  10/04/2020, 7:29 PM

## 2020-10-04 NOTE — ED Provider Notes (Signed)
[facility] DEPT Provider Note   CSN: [id number] Arrival date & time: 10/04/20  1530     History Chief Complaint  Patient presents with  . Fall  . Near Syncope    [name] [name] is a 70 y.o. male.  HPI Patient presents after mechanical fall.  He arrives via EMS here history is obtained by them and the patient. He was in his usual state of health when he had a mechanical fall about 2 hours prior to ED arrival.  He fell onto the left side of his chest, after stumbling on the stairs.  Since that time he has had pain focally in the left axilla.  He had an episode of near syncope secondary to pain while at home, and on EMS arrival he was noted to be hypotensive.  In route he received fluids and most recent blood pressure was 427 systolic. He took a Tylenol at home.  Pain is sore, severe, sharp, left posterior axilla.  No complete syncope, no other pain.    Past Medical History:  Diagnosis Date  . Cardiomegaly   . Colon polyp   . Depression   . Fatty liver disease, nonalcoholic   . Gallstones   . Hypertension   . Hypogonadism male   . Mixed hyperlipidemia   . Obesity   . Pancreas (digestive gland) works poorly   . Prediabetes   . Sleep apnea   . Testosterone deficiency 08/03/2009  . Thyroid disease   . Vitamin D deficiency     Patient Active Problem List   Diagnosis Date Noted  . Fatty liver disease, nonalcoholic 11/17/7626  . Abnormal glucose 07/13/2018  . Erectile dysfunction 09/12/2014  . Major depression in remission ([place]) 09/12/2014  . Myositis 12/31/2013  . Medication management 12/31/2013  . Hypothyroidism 12/31/2013  . Left foot drop 08/30/2013  . Vitamin D deficiency   . Hyperlipidemia 08/04/2009  . Morbid obesity ([place]) - BMI 30+ with OSA 08/03/2009  . Essential hypertension 08/01/2009  . Obstructive sleep apnea 08/01/2009    Past Surgical History:  Procedure Laterality Date  . ANKLE FRACTURE SURGERY Left 1986  .  APPENDECTOMY  1964  . CARPAL TUNNEL RELEASE Bilateral 1996  . CHOLECYSTECTOMY  1976  . COLONOSCOPY    . POLYPECTOMY    . TIBIA IM NAIL INSERTION Right 03/21/2016   Procedure: INTRAMEDULLARY (IM) NAIL TIBIAL;  Surgeon: Renette Butters, MD;  Location: [facility];  Service: Orthopedics;  Laterality: Right;       Family History  Problem Relation Age of Onset  . Diabetes Mother   . Diabetes Father        brother and sister  . Heart disease Father   . Hypertension Father   . Diabetes Sister   . Colon cancer Neg Hx   . Colon polyps Neg Hx   . Rectal cancer Neg Hx   . Stomach cancer Neg Hx   . Esophageal cancer Neg Hx     Social History   Tobacco Use  . Smoking status: Former Smoker    Packs/day: 2.00    Years: 7.00    Pack years: 14.00    Types: Cigarettes    Quit date: 05/27/1974    Years since quitting: 46.3  . Smokeless tobacco: Never Used  Substance Use Topics  . Alcohol use: No  . Drug use: No    Home Medications Prior to Admission medications   Medication Sig Start Date End Date Taking? Authorizing Provider  Ascorbic  Acid (VITAMIN C) 1000 MG tablet Take 1,000 mg by mouth daily.    [provider]  aspirin 325 MG tablet Take 325 mg by mouth daily.    [provider]  B Complex Vitamins (B COMPLEX PO) Take 1 tablet by mouth See admin instructions. Takes M,W,F    [provider]  buPROPion (WELLBUTRIN XL) 150 MG 24 hr tablet TAKE 1 TABLET EVERY MORNING FOR MOOD, FOCUS & CONCENTRATION 12/06/19   Unk Pinto, MD  EPINEPHrine (EPIPEN 2-PAK) 0.3 mg/0.3 mL IJ SOAJ injection Inject 0.3 mLs (0.3 mg total) into the muscle once. 03/06/14   Leonard Schwartz, MD  ezetimibe (ZETIA) 10 MG tablet Take  1 tablet  Daily  for Cholesterol 08/27/20   Unk Pinto, MD  Flaxseed, Linseed, (FLAX SEED OIL) 1000 MG CAPS Take 2,000 mg by mouth daily.    [provider]  gemfibrozil (LOPID) 600 MG tablet TAKE 1 TABLET TWICE A DAY FOR CHOLESTEROL 10/22/19    Unk Pinto, MD  hydrochlorothiazide (HYDRODIURIL) 12.5 MG tablet TAKE 1 TABLET BY MOUTH EVERY DAY 04/12/20   Liane Comber, NP  levothyroxine (SYNTHROID) 50 MCG tablet TAKE 1 TABLET BY MOUTH EVERY DAY 02/10/20   Vladimir Crofts, PA-C  MAGNESIUM PO Take 750 mg by mouth daily.     [provider]  metoprolol tartrate (LOPRESSOR) 25 MG tablet TAKE 1 TABLET TWICE A DAY FOR BP 10/08/19   Unk Pinto, MD  Multiple Vitamin (MULTIVITAMIN) capsule Take by mouth.    [provider]  olmesartan (BENICAR) 40 MG tablet Take  1 tablet  Daily  for BP 08/27/20   Unk Pinto, MD  sertraline (ZOLOFT) 100 MG tablet Take one tablet (100mg ) daily for mood. 08/31/20   Liane Comber, NP    Allergies    Ace inhibitors and Bee venom  Review of Systems   Review of Systems  Constitutional:       Per HPI, otherwise negative  HENT:       Per HPI, otherwise negative  Respiratory:       Per HPI, otherwise negative  Cardiovascular:       Per HPI, otherwise negative  Gastrointestinal: Negative for vomiting.  Endocrine:       Negative aside from HPI  Genitourinary:       Neg aside from HPI   Musculoskeletal:       Per HPI, otherwise negative  Skin: Negative.   Neurological: Negative for syncope.    Physical Exam Updated Vital Signs BP 121/73   Pulse (!) 56   Temp 98.4 F (36.9 C) (Oral)   Resp 20   Ht 5\' 7"  (1.702 m)   Wt 99.8 kg   SpO2 98%   BMI 34.46 kg/m   Physical Exam Vitals and nursing note reviewed.  Constitutional:      General: He is not in acute distress.    Appearance: He is well-developed.  HENT:     Head: Normocephalic and atraumatic.  Eyes:     Conjunctiva/sclera: Conjunctivae normal.  Cardiovascular:     Rate and Rhythm: Normal rate and regular rhythm.  Pulmonary:     Effort: Pulmonary effort is normal. No respiratory distress.     Breath sounds: No stridor.  Chest:    Abdominal:     General: There is no distension.  Skin:    General:  Skin is warm and dry.  Neurological:     Mental Status: He is alert and oriented to person, place, and time.  ED Results / Procedures / Treatments   Labs (all labs ordered are listed, but only abnormal results are displayed) Labs Reviewed  BASIC METABOLIC PANEL - Abnormal; Notable for the following components:      Result Value   Glucose, Bld 133 (*)    BUN 26 (*)    All other components within normal limits  CBC WITH DIFFERENTIAL/PLATELET - Abnormal; Notable for the following components:   Neutro Abs 8.4 (*)    All other components within normal limits  SARS CORONAVIRUS 2 (TAT 6-24 HRS)    Radiology DG Ribs Unilateral W/Chest Left  Result Date: 10/04/2020 CLINICAL DATA:  Fall with rib pain.  Trip and fall on left side. EXAM: LEFT RIBS AND CHEST - 3+ VIEW COMPARISON:  None. FINDINGS: Displaced fractures of left lateral seventh, eighth, ninth, and tenth ribs. There is no evidence of pneumothorax or pleural effusion. Both lungs are clear. Heart size and mediastinal contours are within normal limits. IMPRESSION: Displaced left lateral seventh, eighth, ninth, and tenth rib fractures. No pneumothorax or pulmonary complication. Electronically Signed   By: Keith Rake M.D.   On: 10/04/2020 16:28    Procedures Procedures   Medications Ordered in ED Medications  diclofenac (FLECTOR) 1.3 % 1 patch (1 patch Transdermal Patch Applied 10/04/20 1836)  HYDROmorphone (DILAUDID) injection 1 mg (1 mg Intravenous Given 10/04/20 1808)    ED Course  I have reviewed the triage vital signs and the nursing notes.  Pertinent labs & imaging results that were available during my care of the patient were reviewed by me and considered in my medical decision making (see chart for details).     EMS rhythm strip, 51, sinus bradycardia, T wave abnormalities, abnormal   Update:, Patient had a transient episode of hypotension after receiving his Dilaudid, otherwise has remained hemodynamically  unremarkable after initial fluids provided.  He has received Clofenax, Dilaudid, labs unremarkable, x-ray, as above with 4 rib fractures, left-sided.  Given concern for near syncope, in the context of ground-level fall in which the patient sustained multiple rib fractures, though his initial EKG did not demonstrate arrhythmia, has had elevated risk given his age, for poor outcome.  Patient admitted for further monitoring, management.  Surgery aware to follow as a consulting team, patient admitted to internal medicine.  Final Clinical Impression(s) / ED Diagnoses Final diagnoses:  Fall, initial encounter  Near syncope  Closed fracture of multiple ribs of left side, initial encounter     Carmin Muskrat, MD 10/04/20 [phone]

## 2020-10-05 ENCOUNTER — Ambulatory Visit: Payer: PPO | Admitting: Internal Medicine

## 2020-10-05 ENCOUNTER — Observation Stay (HOSPITAL_COMMUNITY): Payer: PPO

## 2020-10-05 ENCOUNTER — Observation Stay (HOSPITAL_BASED_OUTPATIENT_CLINIC_OR_DEPARTMENT_OTHER): Payer: PPO

## 2020-10-05 DIAGNOSIS — R55 Syncope and collapse: Secondary | ICD-10-CM | POA: Diagnosis not present

## 2020-10-05 DIAGNOSIS — J9811 Atelectasis: Secondary | ICD-10-CM | POA: Diagnosis not present

## 2020-10-05 DIAGNOSIS — S2242XA Multiple fractures of ribs, left side, initial encounter for closed fracture: Secondary | ICD-10-CM | POA: Diagnosis not present

## 2020-10-05 LAB — BASIC METABOLIC PANEL
Anion gap: 8 (ref 5–15)
BUN: 26 mg/dL — ABNORMAL HIGH (ref 8–23)
CO2: 25 mmol/L (ref 22–32)
Calcium: 9.3 mg/dL (ref 8.9–10.3)
Chloride: 105 mmol/L (ref 98–111)
Creatinine, Ser: 0.97 mg/dL (ref 0.61–1.24)
GFR, Estimated: >60 mL/min (ref >60)
Glucose, Bld: 133 mg/dL — ABNORMAL HIGH (ref 70–99)
Potassium: 4.3 mmol/L (ref 3.5–5.1)
Sodium: 138 mmol/L (ref 135–145)

## 2020-10-05 LAB — ECHOCARDIOGRAM COMPLETE
AR max vel: 2.71 cm2
AV Area VTI: 2.97 cm2
AV Area mean vel: 2.79 cm2
AV Mean grad: 6 mmHg
AV Peak grad: 12.3 mmHg
Ao pk vel: 1.75 m/s
Area-P 1/2: 3.93 cm2
Calc EF: 59.2 %
Height: 67 in
MV VTI: 3.79 cm2
P 1/2 time: 423 msec
S' Lateral: 2.4 cm
Single Plane A2C EF: 60.9 %
Single Plane A4C EF: 53.1 %
Weight: 3530.89 oz

## 2020-10-05 LAB — CBC
HCT: 38.4 % — ABNORMAL LOW (ref 39.0–52.0)
Hemoglobin: 12.7 g/dL — ABNORMAL LOW (ref 13.0–17.0)
MCH: 31 pg (ref 26.0–34.0)
MCHC: 33.1 g/dL (ref 30.0–36.0)
MCV: 93.7 fL (ref 80.0–100.0)
Platelets: 210 10*3/uL (ref 150–400)
RBC: 4.1 MIL/uL — ABNORMAL LOW (ref 4.22–5.81)
RDW: 13.9 % (ref 11.5–15.5)
WBC: 8.2 10*3/uL (ref 4.0–10.5)
nRBC: 0 % (ref 0.0–0.2)

## 2020-10-05 LAB — SARS CORONAVIRUS 2 (TAT 6-24 HRS): SARS Coronavirus 2: NEGATIVE

## 2020-10-05 LAB — GLUCOSE, CAPILLARY: Glucose-Capillary: 114 mg/dL — ABNORMAL HIGH (ref 70–99)

## 2020-10-05 LAB — HIV ANTIBODY (ROUTINE TESTING W REFLEX): HIV Screen 4th Generation wRfx: NONREACTIVE

## 2020-10-05 MED ORDER — KETOROLAC TROMETHAMINE 10 MG PO TABS
10.0000 mg | ORAL_TABLET | Freq: Four times a day (QID) | ORAL | Status: DC | PRN
  Administered 2020-10-05: 10 mg via ORAL
  Filled 2020-10-05 (×2): qty 1

## 2020-10-05 MED ORDER — METHOCARBAMOL 750 MG PO TABS: 750.0000 mg | ORAL_TABLET | Freq: Four times a day (QID) | ORAL | 0 refills | Status: DC | PRN

## 2020-10-05 MED ORDER — HYDROCODONE-ACETAMINOPHEN 5-325 MG PO TABS: 1.0000 | ORAL_TABLET | ORAL | 0 refills | Status: DC | PRN

## 2020-10-05 MED ORDER — LIDOCAINE 5 % EX PTCH
1.0000 | MEDICATED_PATCH | CUTANEOUS | Status: DC
  Administered 2020-10-05: 1 via TRANSDERMAL
  Filled 2020-10-05: qty 1

## 2020-10-05 MED ORDER — METHOCARBAMOL 750 MG PO TABS: 750.0000 mg | ORAL_TABLET | Freq: Four times a day (QID) | ORAL | Status: DC | PRN

## 2020-10-05 NOTE — TOC CAGE-AID Note (Signed)
Transition of Care [facility]) - CAGE-AID Screening   Patient Details  Name: [name] [name] MRN: [id number] Date of Birth: January 20, 1951  Bluford Main, RN :10/05/2020, 9:26 AM   Clinical Narrative:  Pt denies alcohol and drug use.   CAGE-AID Screening:    Have You Ever Felt You Ought to Cut Down on Your Drinking or Drug Use?: No Have People Annoyed You By Critizing Your Drinking Or Drug Use?: No Have You Felt Bad Or Guilty About Your Drinking Or Drug Use?: No Have You Ever Had a Drink or Used Drugs First Thing In The Morning to Steady Your Nerves or to Get Rid of a Hangover?: No CAGE-AID Score: 0  Substance Abuse Education Offered: No

## 2020-10-05 NOTE — Consult Note (Signed)
Consult Note  [name] [name] 01-22-1951  [id number].    Requesting MD: Dr. Carmin Muskrat Chief Complaint/Reason for Consult: fall, rib fractures  HPI:  70 year old male with past medical history of OSA on CPAP, hypertension, hyperlipidemia, hypothyroidism, depression who presented to the ED on 511 via EMS after a mechanical fall at home.  He is complaining of left flank pain.  Patient reports he was walking up the stairs when he tripped and fell on his left side.  He has been having pain in that area since the fall.  While at home he had a near syncopal event which he attributes to the pain -at that time he became diaphoretic and nauseous.  Today he continues to have left flank pain and pain with deep inspiration.  He denies shortness of breath, chest pain, nausea, emesis, abdominal pain, dysuria, diarrhea, constipation.  On exam he has ecchymosis to the left posterior upper arm.  He was unaware of this and does not have significant pain in the area.  He has past surgical history of exploratory laparotomy and cholecystectomy complicated by postsurgical infection back in the 1970s.  He denies tobacco and substance use  ROS: Review of Systems  Constitutional: Negative for chills, fever and malaise/fatigue.  Respiratory: Negative for cough, shortness of breath and wheezing.   Cardiovascular: Negative for chest pain, palpitations and leg swelling.  Gastrointestinal: Negative for abdominal pain, constipation, diarrhea, nausea and vomiting.  Musculoskeletal: Positive for back pain (left flank).    Family History  Problem Relation Age of Onset  . Diabetes Mother   . Diabetes Father        brother and sister  . Heart disease Father   . Hypertension Father   . Diabetes Sister   . Colon cancer Neg Hx   . Colon polyps Neg Hx   . Rectal cancer Neg Hx   . Stomach cancer Neg Hx   . Esophageal cancer Neg Hx     Past Medical History:  Diagnosis Date  . Cardiomegaly   . Colon  polyp   . Depression   . Fatty liver disease, nonalcoholic   . Gallstones   . Hypertension   . Hypogonadism male   . Mixed hyperlipidemia   . Obesity   . Pancreas (digestive gland) works poorly   . Prediabetes   . Sleep apnea   . Testosterone deficiency 08/03/2009  . Thyroid disease   . Vitamin D deficiency     Past Surgical History:  Procedure Laterality Date  . ANKLE FRACTURE SURGERY Left 1986  . APPENDECTOMY  1964  . CARPAL TUNNEL RELEASE Bilateral 1996  . CHOLECYSTECTOMY  1976  . COLONOSCOPY    . POLYPECTOMY    . TIBIA IM NAIL INSERTION Right 03/21/2016   Procedure: INTRAMEDULLARY (IM) NAIL TIBIAL;  Surgeon: Renette Butters, MD;  Location: [facility];  Service: Orthopedics;  Laterality: Right;    Social History:  reports that he quit smoking about 46 years ago. His smoking use included cigarettes. He has a 14.00 pack-year smoking history. He has never used smokeless tobacco. He reports that he does not drink alcohol and does not use drugs.  Allergies:  Allergies  Allergen Reactions  . Ace Inhibitors Other (See Comments)    cough  . Bee Venom Other (See Comments)    Medications Prior to Admission  Medication Sig Dispense Refill  . Ascorbic Acid (VITAMIN C) 1000 MG tablet Take 1,000 mg by mouth daily.    Marland Kitchen  aspirin 325 MG tablet Take 325 mg by mouth daily.    . B Complex Vitamins (B COMPLEX PO) Take 1 tablet by mouth See admin instructions. Takes M,W,F    . buPROPion (WELLBUTRIN XL) 150 MG 24 hr tablet TAKE 1 TABLET EVERY MORNING FOR MOOD, FOCUS & CONCENTRATION 90 tablet 3  . EPINEPHrine (EPIPEN 2-PAK) 0.3 mg/0.3 mL IJ SOAJ injection Inject 0.3 mLs (0.3 mg total) into the muscle once. 2 Device 0  . ezetimibe (ZETIA) 10 MG tablet Take  1 tablet  Daily  for Cholesterol 90 tablet 1  . Flaxseed, Linseed, (FLAX SEED OIL) 1000 MG CAPS Take 2,000 mg by mouth daily.    Marland Kitchen gemfibrozil (LOPID) 600 MG tablet TAKE 1 TABLET TWICE A DAY FOR CHOLESTEROL 180 tablet 3  . hydrochlorothiazide  (HYDRODIURIL) 12.5 MG tablet TAKE 1 TABLET BY MOUTH EVERY DAY 90 tablet 1  . levothyroxine (SYNTHROID) 50 MCG tablet TAKE 1 TABLET BY MOUTH EVERY DAY 90 tablet 3  . MAGNESIUM PO Take 750 mg by mouth daily.     . metoprolol tartrate (LOPRESSOR) 25 MG tablet TAKE 1 TABLET TWICE A DAY FOR BP 180 tablet 3  . Multiple Vitamin (MULTIVITAMIN) capsule Take by mouth.    . olmesartan (BENICAR) 40 MG tablet Take  1 tablet  Daily  for BP 90 tablet 1  . sertraline (ZOLOFT) 100 MG tablet Take one tablet (100mg ) daily for mood. 134 tablet 3    Blood pressure (!) 86/59, pulse 61, temperature 98.1 F (36.7 C), temperature source Oral, resp. rate 19, height 5\' 7"  (1.702 m), weight 100.1 kg, SpO2 96 %. Physical Exam Vitals reviewed.  Constitutional:      General: He is not in acute distress.    Appearance: He is not toxic-appearing or diaphoretic.  HENT:     Head: Normocephalic and atraumatic.     Right Ear: External ear normal.     Left Ear: External ear normal.     Nose: Nose normal.  Eyes:     General:        Right eye: No discharge.        Left eye: No discharge.     Conjunctiva/sclera: Conjunctivae normal.  Cardiovascular:     Rate and Rhythm: Normal rate and regular rhythm.     Pulses: Normal pulses.     Heart sounds: Normal heart sounds.  Pulmonary:     Effort: Pulmonary effort is normal. No respiratory distress.     Breath sounds: No wheezing.  Chest:     Chest wall: Tenderness (left lower chest) present.  Abdominal:     General: Bowel sounds are normal. There is no distension.     Palpations: Abdomen is soft. There is no mass.     Tenderness: There is no abdominal tenderness. There is no guarding or rebound.     Comments: Well healed midline and right upper quadrant surgical scars  Musculoskeletal:        General: Normal range of motion.     Comments: Ecchymosis to poster left upper arm - extremity grip strength, sensation, ROM intact Right elbow with minor abrasion - extremity grip  strength, sensation, ROM intact  Bilateral lower extremities with ROM intact  Left flank with tenderness to palpation. No significant ecchymosis or bruising to left flank  Skin:    General: Skin is warm and dry.  Neurological:     Mental Status: He is alert and oriented to person, place, and time.  Comments: Bilateral upper and lower extremity strength and sensation intact  Psychiatric:        Mood and Affect: Mood normal.        Behavior: Behavior normal.       Results for orders placed or performed during the hospital encounter of 10/04/20 (from the past 48 hour(s))  Basic metabolic panel     Status: Abnormal   Collection Time: 10/04/20  4:30 PM  Result Value Ref Range   Sodium 141 135 - 145 mmol/L   Potassium 3.8 3.5 - 5.1 mmol/L   Chloride 105 98 - 111 mmol/L   CO2 27 22 - 32 mmol/L   Glucose, Bld 133 (H) 70 - 99 mg/dL    Comment: Glucose reference range applies only to samples taken after fasting for at least 8 hours.   BUN 26 (H) 8 - 23 mg/dL   Creatinine, Ser 1.00 0.61 - 1.24 mg/dL   Calcium 9.8 8.9 - 10.3 mg/dL   GFR, Estimated >60 >60 mL/min    Comment: (NOTE) Calculated using the CKD-EPI Creatinine Equation (2021)    Anion gap 9 5 - 15    Comment: Performed at [facility], [place] [address] [place], [place] [address]  CBC with Differential     Status: Abnormal   Collection Time: 10/04/20  4:30 PM  Result Value Ref Range   WBC 10.1 4.0 - 10.5 K/uL   RBC 4.44 4.22 - 5.81 MIL/uL   Hemoglobin 13.8 13.0 - 17.0 g/dL   HCT 42.3 39.0 - 52.0 %   MCV 95.3 80.0 - 100.0 fL   MCH 31.1 26.0 - 34.0 pg   MCHC 32.6 30.0 - 36.0 g/dL   RDW 13.6 11.5 - 15.5 %   Platelets 157 150 - 400 K/uL    Comment: SPECIMEN CHECKED FOR CLOTS REPEATED TO VERIFY PLATELET COUNT CONFIRMED BY SMEAR    nRBC 0.2 0.0 - 0.2 %   Neutrophils Relative % 83 %   Neutro Abs 8.4 (H) 1.7 - 7.7 K/uL   Lymphocytes Relative 8 %   Lymphs Abs 0.8 0.7 - 4.0 K/uL   Monocytes Relative 7  %   Monocytes Absolute 0.8 0.1 - 1.0 K/uL   Eosinophils Relative 1 %   Eosinophils Absolute 0.1 0.0 - 0.5 K/uL   Basophils Relative 0 %   Basophils Absolute 0.0 0.0 - 0.1 K/uL   Immature Granulocytes 1 %   Abs Immature Granulocytes 0.07 0.00 - 0.07 K/uL    Comment: Performed at [facility], [place] [address] [place], [place] [address]  SARS CORONAVIRUS 2 (TAT 6-24 HRS) Nasopharyngeal Nasopharyngeal Swab     Status: None   Collection Time: 10/04/20  4:48 PM   Specimen: Nasopharyngeal Swab  Result Value Ref Range   SARS Coronavirus 2 NEGATIVE NEGATIVE    Comment: (NOTE) SARS-CoV-2 target nucleic acids are NOT DETECTED.  The SARS-CoV-2 RNA is generally detectable in upper and lower respiratory specimens during the acute phase of infection. Negative results do not preclude SARS-CoV-2 infection, do not rule out co-infections with other pathogens, and should not be used as the sole basis for treatment or other patient management decisions. Negative results must be combined with clinical observations, patient history, and epidemiological information. The expected result is Negative.  Fact Sheet for Patients: SugarRoll.be  Fact Sheet for Healthcare Providers: https://www.woods-mathews.com/  This test is not yet approved or cleared by the [place] FDA and  has been authorized for detection and/or diagnosis  of SARS-CoV-2 by FDA under an Emergency Use Authorization (EUA). This EUA will remain  in effect (meaning this test can be used) for the duration of the COVID-19 declaration under Se ction 564(b)(1) of the Act, 21 U.S.C. section 360bbb-3(b)(1), unless the authorization is terminated or revoked sooner.  Performed at [facility], [place] [address] [place], [place] [id number]   Basic metabolic panel     Status: Abnormal   Collection Time: 10/05/20  4:51 AM  Result Value Ref Range   Sodium 138 135 - 145 mmol/L    Potassium 4.3 3.5 - 5.1 mmol/L   Chloride 105 98 - 111 mmol/L   CO2 25 22 - 32 mmol/L   Glucose, Bld 133 (H) 70 - 99 mg/dL    Comment: Glucose reference range applies only to samples taken after fasting for at least 8 hours.   BUN 26 (H) 8 - 23 mg/dL   Creatinine, Ser 0.97 0.61 - 1.24 mg/dL   Calcium 9.3 8.9 - 10.3 mg/dL   GFR, Estimated >60 >60 mL/min    Comment: (NOTE) Calculated using the CKD-EPI Creatinine Equation (2021)    Anion gap 8 5 - 15    Comment: Performed at [facility] [address] [place], [place] [address]  CBC     Status: Abnormal   Collection Time: 10/05/20  4:51 AM  Result Value Ref Range   WBC 8.2 4.0 - 10.5 K/uL   RBC 4.10 (L) 4.22 - 5.81 MIL/uL   Hemoglobin 12.7 (L) 13.0 - 17.0 g/dL   HCT 38.4 (L) 39.0 - 52.0 %   MCV 93.7 80.0 - 100.0 fL   MCH 31.0 26.0 - 34.0 pg   MCHC 33.1 30.0 - 36.0 g/dL   RDW 13.9 11.5 - 15.5 %   Platelets 210 150 - 400 K/uL   nRBC 0.0 0.0 - 0.2 %    Comment: Performed at [facility], [place] [address] [place], [place] [address]  Glucose, capillary     Status: Abnormal   Collection Time: 10/05/20  5:58 AM  Result Value Ref Range   Glucose-Capillary 114 (H) 70 - 99 mg/dL    Comment: Glucose reference range applies only to samples taken after fasting for at least 8 hours.   DG Ribs Unilateral W/Chest Left  Result Date: 10/04/2020 CLINICAL DATA:  Fall with rib pain.  Trip and fall on left side. EXAM: LEFT RIBS AND CHEST - 3+ VIEW COMPARISON:  None. FINDINGS: Displaced fractures of left lateral seventh, eighth, ninth, and tenth ribs. There is no evidence of pneumothorax or pleural effusion. Both lungs are clear. Heart size and mediastinal contours are within normal limits. IMPRESSION: Displaced left lateral seventh, eighth, ninth, and tenth rib fractures. No pneumothorax or pulmonary complication. Electronically Signed   By: Keith Rake M.D.   On: 10/04/2020 16:28      Assessment/Plan Fall  Rib fractures 7-10,  displaced - no PTX on imaging 5/11. Repeat CXR stable without PTX. Recommend IS and pulmonary toilet. Multimodal pain control  Trauma service will sign off but please do not hesitate to contact us with any questions or concerns  Winferd Humphrey, [facility] Surgery 10/05/2020, 7:43 AM Please see Amion for pager number during day hours 7:00am-4:30pm

## 2020-10-05 NOTE — Evaluation (Signed)
Physical Therapy Evaluation Patient Details Name: [name] [name] MRN: [id number] DOB: Aug 15, 1950 Today's Date: 10/05/2020   History of Present Illness  70 yo male presents to [facility] on 5/11 with fall in his yard followed by syncopal episode. Pt sustained 7-10th L rib fxs. PMH includes cardiomegaly, depression, fatty liver disease, preDM, obesity, HTN, depression, L foot drop, OSA, L ankle fx surgery 1980s, R tibia IM nail 2017.  Clinical Impression   Pt presents with L sided rib pain, impaired standing balance with history of falls, and decreased activity tolerance vs baseline. Pt to benefit from acute PT to address deficits. Pt ambulated hallway distance with use of RW and close guard for safety, pt benefiting from RW for stability and supporting pt trunk. PT recommending DME listed below, suspect pt is close to baseline mobility. PT discussed AFO use with pt and wife, recommending orthotist for pt to address L drop foot to decrease fall risk in the future. PT to progress mobility as tolerated, and will continue to follow acutely.      10/05/20 0900  Orthostatic Lying   BP- Lying 119/71  Pulse- Lying 71  Orthostatic Sitting  BP- Sitting (!) 121/102  Pulse- Sitting 88  Orthostatic Standing at 0 minutes  BP- Standing at 0 minutes 124/70  Pulse- Standing at 0 minutes 87  Orthostatic Standing at 3 minutes  BP- Standing at 3 minutes 138/78  Pulse- Standing at 3 minutes 84    Follow Up Recommendations Supervision for mobility/OOB;No PT follow up    Equipment Recommendations  Rolling walker with 5" wheels;3in1 (PT)    Recommendations for Other Services       Precautions / Restrictions Precautions Precautions: Fall Restrictions Weight Bearing Restrictions: No      Mobility  Bed Mobility Overal bed mobility: Needs Assistance Bed Mobility: Rolling;Sidelying to Sit Rolling: Min assist Sidelying to sit: Min assist       General bed mobility comments: min assist for log roll  technique, trunk elevation to upright sitting. Increased time and effort, with use of bedrails to perform.    Transfers Overall transfer level: Needs assistance Equipment used: Rolling walker (2 wheeled) Transfers: Sit to/from Stand Sit to Stand: Min guard;From elevated surface         General transfer comment: for safety, bed elevated to mimick pt's bed at home. Verbal cuing for hand placemetn when rising/sitting.  Ambulation/Gait Ambulation/Gait assistance: Min guard Gait Distance (Feet): 125 Feet Assistive device: Rolling walker (2 wheeled) Gait Pattern/deviations: Step-through pattern;Decreased stride length;Trunk flexed;Decreased dorsiflexion - left Gait velocity: decr   General Gait Details: min guard for safety, verbal cuing for placement in RW. Increased time secondary to L rib pain.  Stairs Stairs: Yes   Stair Management: One rail Right;Seated/boosting Number of Stairs: 2 General stair comments: steps mimicked with use of R hallway railing and SL marching x2 each leg. Pt reports his wife will assist him ascend steps, does not need to practice steps in stairwell per report  Wheelchair Mobility    Modified Rankin (Stroke Patients Only)       Balance Overall balance assessment: Needs assistance;History of Falls Sitting-balance support: Feet supported Sitting balance-Leahy Scale: Good     Standing balance support: Bilateral upper extremity supported;During functional activity Standing balance-Leahy Scale: Poor Standing balance comment: reliant on external assist                             Pertinent Vitals/Pain Pain Assessment:  0-10 Pain Score: 4  Pain Location: L ribs Pain Descriptors / Indicators: Sore;Discomfort;Grimacing Pain Intervention(s): Limited activity within patient's tolerance;Monitored during session    Home Living Family/patient expects to be discharged to:: Private residence Living Arrangements: Spouse/significant  other Available Help at Discharge: Family;Available 24 hours/day Type of Home: House Home Access: Stairs to enter Entrance Stairs-Rails: None Entrance Stairs-Number of Steps: 3 Home Layout: One level Home Equipment: Cane - single point      Prior Function Level of Independence: Independent               Hand Dominance   Dominant Hand: Right    Extremity/Trunk Assessment   Upper Extremity Assessment Upper Extremity Assessment: Overall WFL for tasks assessed    Lower Extremity Assessment Lower Extremity Assessment: Overall WFL for tasks assessed;LLE deficits/detail LLE Deficits / Details: history of drop foot, does not have AFO    Cervical / Trunk Assessment Cervical / Trunk Assessment: Normal  Communication   Communication: No difficulties  Cognition Arousal/Alertness: Awake/alert Behavior During Therapy: WFL for tasks assessed/performed Overall Cognitive Status: Within Functional Limits for tasks assessed                                        General Comments General comments (skin integrity, edema, etc.): Orthostatic vitals negative for hypotension    Exercises Other Exercises Other Exercises: discussed pt bracing ribs when coughing, sneezing, and in/out of bed with pillow, pt demonstrates good technique   Assessment/Plan    PT Assessment Patient needs continued PT services  PT Problem List Decreased mobility;Decreased safety awareness;Decreased activity tolerance;Decreased balance;Decreased knowledge of use of DME;Pain       PT Treatment Interventions DME instruction;Therapeutic activities;Gait training;Therapeutic exercise;Patient/family education;Balance training;Stair training;Functional mobility training;Neuromuscular re-education    PT Goals (Current goals can be found in the Care Plan section)  Acute Rehab PT Goals Patient Stated Goal: go home PT Goal Formulation: With patient Time For Goal Achievement: 10/19/20 Potential to  Achieve Goals: Good    Frequency Min 3X/week   Barriers to discharge        Co-evaluation               AM-PAC PT "6 Clicks" Mobility  Outcome Measure Help needed turning from your back to your side while in a flat bed without using bedrails?: A Little Help needed moving from lying on your back to sitting on the side of a flat bed without using bedrails?: A Little Help needed moving to and from a bed to a chair (including a wheelchair)?: A Little Help needed standing up from a chair using your arms (e.g., wheelchair or bedside chair)?: A Little Help needed to walk in hospital room?: A Little Help needed climbing 3-5 steps with a railing? : A Little 6 Click Score: 18    End of Session   Activity Tolerance: Patient tolerated treatment well Patient left: in chair;with call bell/phone within reach;with chair alarm set;with family/visitor present Nurse Communication: Mobility status PT Visit Diagnosis: Difficulty in walking, not elsewhere classified (R26.2);Pain Pain - Right/Left: Left Pain - part of body:  (ribs)    Time: 9924-2683 PT Time Calculation (min) (ACUTE ONLY): 32 min   Charges:   PT Evaluation $PT Eval Low Complexity: 1 Low PT Treatments $Gait Training: 8-22 mins       Stacie Glaze, PT DPT Acute Rehabilitation Services Pager [phone]  Office [phone]  [name] [name] 10/05/2020, 10:53 AM

## 2020-10-05 NOTE — Progress Notes (Signed)
[place] to be D/C'd  per MD order. Discussed with the patient and all questions fully answered.  VSS, Skin clean, dry and intact without evidence of skin break down, no evidence of skin tears noted.  IV catheter discontinued intact. Site without signs and symptoms of complications. Dressing and pressure applied.  An After Visit Summary was printed and given to the patient. Patient prescription is waiting at the pharmacy.  D/c education completed with patient/family including follow up instructions, medication list, d/c activities limitations if indicated, with other d/c instructions as indicated by MD - patient able to verbalize understanding, all questions fully answered.   Patient instructed to return to ED, call 911, or call MD for any changes in condition.   Patient to be escorted via [place], and D/C home via private auto.

## 2020-10-05 NOTE — Progress Notes (Signed)
The patient is  admitted from [facility] ER with the diagnosis of syncope He was accompanied by 3 EMS personnel . A & O x 4. Denies any acute pain at this moment. Will continue to monitor.

## 2020-10-05 NOTE — Discharge Summary (Signed)
Physician Discharge Summary  [name] [name] NID:[id number] DOB: Jul 12, 1950 DOA: 10/04/2020  PCP: Unk Pinto, MD  Admit date: 10/04/2020 Discharge date: 10/05/2020  Admitted From: Home Disposition: Home  Recommendations for Outpatient Follow-up:  1. Follow up with PCP in 1-2 weeks 2. Please obtain BMP/CBC in one week 3. Please follow up on the following pending results: None  Home Health: No Equipment/Devices: None Discharge Condition: Stable CODE STATUS: Full Diet recommendation: Heart Healthy    Brief/Interim Summary: [name] [name] is a 70 y.o. male with medical history significant for hypertension, hyperlipidemia, sinus bradycardia, hypothyroidism, depression who presents to the ED for evaluation of left flank pain after a fall at home.  Patient states he was working in the yard when he tripped over the rug lining of his flower bed.  He landed on his left flank flank area and has been having focal pain since his fall.  He says he went inside the house to lay down but felt uncomfortable.   He was found to have fractures of left 7, 8, 9 and 10 lateral ribs.  Trauma surgery was consulted and they were recommending conservative and symptom management. Patient was discharged home on muscle relaxants and opiates for acute pain.  He was also found to have hypotension with IV Dilaudid.  Blood pressure improved.  His home antihypertensives were held during hospitalization which he can resumed at home.  He was advised to follow-up closely with his primary care provider for further recommendations.  Patient also had echocardiogram which is without any acute abnormality and did show mild dilatation of aorta, ascending aorta at 41 cm.  Patient will continue rest of his home medications and follow-up with his providers.  Discharge Diagnoses:  Principal Problem:   Near syncope Active Problems:   Hyperlipidemia   Essential hypertension   Hypothyroidism   Multiple fractures of ribs, left  side, initial encounter for closed fracture   Discharge Instructions  Discharge Instructions    Diet - low sodium heart healthy   Complete by: As directed    Discharge instructions   Complete by: As directed    It was pleasure taking care of you. You are being given pain medications which you can use it as needed, you can also use ibuprofen or Aleve as needed. Please be mindful that opioids can cause dizziness-avoid falls,. These pain medications can also cause constipation-keep yourself well-hydrated and use over-the-counter stool softeners or MiraLAX if needed. You are also being given some muscle relaxant to be used as needed. You can also try using over-the-counter lidocaine patches to help with pain. You can resume your home home blood pressure medications from tomorrow after checking your blood pressure, we held it while you are in the hospital as blood pressure was little soft. Please follow-up with your primary care provider within next few days for further recommendations.   Increase activity slowly   Complete by: As directed      Allergies as of 10/05/2020      Reactions   Ace Inhibitors Other (See Comments)   cough   Bee Venom Other (See Comments)      Medication List    TAKE these medications   aspirin 325 MG tablet Take 325 mg by mouth daily.   B COMPLEX PO Take 1 tablet by mouth See admin instructions. Takes M,W,F   buPROPion 150 MG 24 hr tablet Commonly known as: WELLBUTRIN XL TAKE 1 TABLET EVERY MORNING FOR MOOD, FOCUS & CONCENTRATION   EPINEPHrine 0.3  mg/0.3 mL Soaj injection Commonly known as: EpiPen 2-Pak Inject 0.3 mLs (0.3 mg total) into the muscle once.   ezetimibe 10 MG tablet Commonly known as: ZETIA Take  1 tablet  Daily  for Cholesterol   Flax Seed Oil 1000 MG Caps Take 2,000 mg by mouth daily.   gemfibrozil 600 MG tablet Commonly known as: LOPID TAKE 1 TABLET TWICE A DAY FOR CHOLESTEROL   hydrochlorothiazide 12.5 MG tablet Commonly  known as: HYDRODIURIL TAKE 1 TABLET BY MOUTH EVERY DAY   HYDROcodone-acetaminophen 5-325 MG tablet Commonly known as: NORCO/VICODIN Take 1-2 tablets by mouth every 4 (four) hours as needed for moderate pain (Hold & Call MD if SBP<90, HR<65, RR<10, O2<90, or altered mental status.).   levothyroxine 50 MCG tablet Commonly known as: SYNTHROID TAKE 1 TABLET BY MOUTH EVERY DAY   MAGNESIUM PO Take 750 mg by mouth daily.   methocarbamol 750 MG tablet Commonly known as: ROBAXIN Take 1 tablet (750 mg total) by mouth every 6 (six) hours as needed for muscle spasms (rib pain).   metoprolol tartrate 25 MG tablet Commonly known as: LOPRESSOR TAKE 1 TABLET TWICE A DAY FOR BP   multivitamin capsule Take by mouth.   olmesartan 40 MG tablet Commonly known as: BENICAR Take  1 tablet  Daily  for BP   sertraline 100 MG tablet Commonly known as: ZOLOFT Take one tablet (100mg ) daily for mood.   vitamin C 1000 MG tablet Take 1,000 mg by mouth daily.       Follow-up Information    Unk Pinto, MD. Schedule an appointment as soon as possible for a visit.   Specialty: Internal Medicine Contact information: [address] [place] [address] [phone]              Allergies  Allergen Reactions  . Ace Inhibitors Other (See Comments)    cough  . Bee Venom Other (See Comments)    Consultations:  General surgery-trauma  Procedures/Studies: DG Ribs Unilateral W/Chest Left  Result Date: 10/04/2020 CLINICAL DATA:  Fall with rib pain.  Trip and fall on left side. EXAM: LEFT RIBS AND CHEST - 3+ VIEW COMPARISON:  None. FINDINGS: Displaced fractures of left lateral seventh, eighth, ninth, and tenth ribs. There is no evidence of pneumothorax or pleural effusion. Both lungs are clear. Heart size and mediastinal contours are within normal limits. IMPRESSION: Displaced left lateral seventh, eighth, ninth, and tenth rib fractures. No pneumothorax or pulmonary  complication. Electronically Signed   By: [name] [name] M.D.   On: 10/04/2020 16:28   DG CHEST PORT 1 VIEW  Result Date: 10/05/2020 CLINICAL DATA:  Pain.  Several rib fractures present on left EXAM: PORTABLE CHEST 1 VIEW COMPARISON:  Chest and rib radiographs Oct 04, 2020 FINDINGS: There is atelectatic change in the left base. The lungs elsewhere are clear. Heart size and pulmonary vascularity are within normal limits. No adenopathy. No pneumothorax or pleural effusion. There are displaced rib fractures on the left laterally, stable from 1 day prior. IMPRESSION: Displaced rib fractures on the left, stable from 1 day prior. There is left base atelectasis. No pneumothorax or pleural effusion evident. No edema or airspace opacity. Stable cardiac silhouette. Electronically Signed   By: Lowella Grip III M.D.   On: 10/05/2020 09:10   ECHOCARDIOGRAM COMPLETE  Result Date: 10/05/2020    ECHOCARDIOGRAM REPORT   Patient Name:   [name] [name] Date of Exam: 10/05/2020 Medical Rec #:  [id number]      Height:  67.0 in Accession #:    [id number]     Weight:       220.7 lb Date of Birth:  06/21/1950       BSA:          2.109 m Patient Age:    31 years       BP:           86/59 mmHg Patient Gender: M              HR:           87 bpm. Exam Location:  Inpatient Procedure: 2D Echo, Cardiac Doppler and Color Doppler Indications:    Syncope  History:        Patient has no prior history of Echocardiogram examinations.                 Cardiomegaly; Risk Factors:Dyslipidemia.  Sonographer:    Luisa Hart RDCS Referring Phys: [id number] Cleaster Corin PATEL  Sonographer Comments: Technically difficult study due to poor echo windows and patient is morbidly obese. Image acquisition challenging due to patient body habitus. IMPRESSIONS  1. Left ventricular ejection fraction, by estimation, is 65 to 70%. The left ventricle has normal function. The left ventricle has no regional wall motion abnormalities. Left ventricular diastolic  parameters were normal.  2. Right ventricular systolic function is normal. The right ventricular size is normal. There is normal pulmonary artery systolic pressure.  3. The mitral valve is normal in structure. No evidence of mitral valve regurgitation.  4. The aortic valve is normal in structure. Aortic valve regurgitation is not visualized.  5. Aortic dilatation noted. There is mild dilatation of the ascending aorta, measuring 41 mm. FINDINGS  Left Ventricle: Left ventricular ejection fraction, by estimation, is 65 to 70%. The left ventricle has normal function. The left ventricle has no regional wall motion abnormalities. The left ventricular internal cavity size was normal in size. There is  no left ventricular hypertrophy. Left ventricular diastolic parameters were normal. Right Ventricle: The right ventricular size is normal. No increase in right ventricular wall thickness. Right ventricular systolic function is normal. There is normal pulmonary artery systolic pressure. The tricuspid regurgitant velocity is 2.45 m/s, and  with an assumed right atrial pressure of 3 mmHg, the estimated right ventricular systolic pressure is 26.3 mmHg. Left Atrium: Left atrial size was normal in size. Right Atrium: Right atrial size was normal in size. Pericardium: There is no evidence of pericardial effusion. Mitral Valve: The mitral valve is normal in structure. No evidence of mitral valve regurgitation. MV peak gradient, 4.2 mmHg. The mean mitral valve gradient is 2.0 mmHg. Tricuspid Valve: The tricuspid valve is normal in structure. Tricuspid valve regurgitation is trivial. Aortic Valve: The aortic valve is normal in structure. Aortic valve regurgitation is not visualized. Aortic regurgitation PHT measures 423 msec. Aortic valve mean gradient measures 6.0 mmHg. Aortic valve peak gradient measures 12.2 mmHg. Aortic valve area, by VTI measures 2.97 cm. Pulmonic Valve: The pulmonic valve was normal in structure. Pulmonic valve  regurgitation is not visualized. Aorta: Aortic dilatation noted. There is mild dilatation of the ascending aorta, measuring 41 mm. IAS/Shunts: The atrial septum is grossly normal.  LEFT VENTRICLE PLAX 2D LVIDd:         4.60 cm     Diastology LVIDs:         2.40 cm     LV e' medial:    4.57 cm/s LV PW:  1.50 cm     LV E/e' medial:  13.3 LV IVS:        1.30 cm     LV e' lateral:   7.18 cm/s LVOT diam:     2.30 cm     LV E/e' lateral: 8.5 LV SV:         75 LV SV Index:   35 LVOT Area:     4.15 cm  LV Volumes (MOD) LV vol d, MOD A2C: 63.9 ml LV vol d, MOD A4C: 91.8 ml LV vol s, MOD A2C: 25.0 ml LV vol s, MOD A4C: 43.1 ml LV SV MOD A2C:     38.9 ml LV SV MOD A4C:     91.8 ml LV SV MOD BP:      45.7 ml RIGHT VENTRICLE RV S prime:     22.90 cm/s  PULMONARY VEINS TAPSE (M-mode): 3.2 cm      A Reversal Duration: 119.00 msec                             A Reversal Velocity: 37.90 cm/s                             Diastolic Velocity:  [id number] cm/s                             S/D Velocity:        1.40                             Systolic Velocity:   0000000 cm/s LEFT ATRIUM             Index LA diam:        3.60 cm 1.71 cm/m LA Vol (A2C):   49.4 ml 23.43 ml/m LA Vol (A4C):   29.6 ml 14.04 ml/m LA Biplane Vol: 38.2 ml 18.12 ml/m  AORTIC VALVE AV Area (Vmax):    2.71 cm AV Area (Vmean):   2.79 cm AV Area (VTI):     2.97 cm AV Vmax:           175.00 cm/s AV Vmean:          113.000 cm/s AV VTI:            0.252 m AV Peak Grad:      12.2 mmHg AV Mean Grad:      6.0 mmHg LVOT Vmax:         114.00 cm/s LVOT Vmean:        75.900 cm/s LVOT VTI:          0.180 m LVOT/AV VTI ratio: 0.71 AI PHT:            423 msec  AORTA Ao Root diam: 4.10 cm Ao Asc diam:  3.80 cm MITRAL VALVE               TRICUSPID VALVE MV Area (PHT): 3.93 cm    TR Peak grad:   24.0 mmHg MV Area VTI:   3.79 cm    TR Vmax:        245.00 cm/s MV Peak grad:  4.2 mmHg MV Mean grad:  2.0 mmHg    SHUNTS MV Vmax:       1.02 m/s    Systemic VTI:  0.18  m MV Vmean:       63.3 cm/s   Systemic Diam: 2.30 cm MV Decel Time: 193 msec MV E velocity: 60.90 cm/s MV A velocity: 75.80 cm/s MV E/A ratio:  0.80 Mertie Moores MD Electronically signed by Mertie Moores MD Signature Date/Time: 10/05/2020/3:18:41 PM    Final      Subjective: Patient was seen and examined today.  No new complaint.  Continues to have left lateral chest wall soreness.  Denies any presyncope or syncopal episode.  Had a mechanical fall after tripping over a fence of fifth flower bed and hitting his chest on a brick wall.  Wife at bedside.  Discharge Exam: Vitals:   10/05/20 1123 10/05/20 1441  BP: 123/90 129/86  Pulse: 93 88  Resp: 16 17  Temp: 98.6 F (37 C) 98.6 F (37 C)  SpO2: 93% 94%   Vitals:   10/04/20 2350 10/05/20 0404 10/05/20 1123 10/05/20 1441  BP: (!) 90/59 (!) 86/59 123/90 129/86  Pulse: 60 61 93 88  Resp: 19 19 16 17   Temp:  98.1 F (36.7 C) 98.6 F (37 C) 98.6 F (37 C)  TempSrc:  Oral Oral Oral  SpO2: 96% 96% 93% 94%  Weight:  100.1 kg    Height:        General: Pt is alert, awake, not in acute distress Cardiovascular: RRR, S1/S2 +, no rubs, no gallops Respiratory: CTA bilaterally, no wheezing, no rhonchi Abdominal: Soft, NT, ND, bowel sounds + Extremities: no edema, no cyanosis   The results of significant diagnostics from this hospitalization (including imaging, microbiology, ancillary and laboratory) are listed below for reference.    Microbiology: Recent Results (from the past 240 hour(s))  SARS CORONAVIRUS 2 (TAT 6-24 HRS) Nasopharyngeal Nasopharyngeal Swab     Status: None   Collection Time: 10/04/20  4:48 PM   Specimen: Nasopharyngeal Swab  Result Value Ref Range Status   SARS Coronavirus 2 NEGATIVE NEGATIVE Final    Comment: (NOTE) SARS-CoV-2 target nucleic acids are NOT DETECTED.  The SARS-CoV-2 RNA is generally detectable in upper and lower respiratory specimens during the acute phase of infection. Negative results do not preclude  SARS-CoV-2 infection, do not rule out co-infections with other pathogens, and should not be used as the sole basis for treatment or other patient management decisions. Negative results must be combined with clinical observations, patient history, and epidemiological information. The expected result is Negative.  Fact Sheet for Patients: SugarRoll.be  Fact Sheet for Healthcare Providers: https://www.woods-mathews.com/  This test is not yet approved or cleared by the [place] FDA and  has been authorized for detection and/or diagnosis of SARS-CoV-2 by FDA under an Emergency Use Authorization (EUA). This EUA will remain  in effect (meaning this test can be used) for the duration of the COVID-19 declaration under Se ction 564(b)(1) of the Act, 21 U.S.C. section 360bbb-3(b)(1), unless the authorization is terminated or revoked sooner.  Performed at [facility], [place] [address], [place], [place] [address]      Labs: BNP (last 3 results) No results for input(s): BNP in the last 8760 hours. Basic Metabolic Panel: Recent Labs  Lab 10/04/20 1630 10/05/20 0451  NA 141 138  K 3.8 4.3  CL 105 105  CO2 27 25  GLUCOSE 133* 133*  BUN 26* 26*  CREATININE 1.00 0.97  CALCIUM 9.8 9.3   Liver Function Tests: No results for input(s): AST, ALT, ALKPHOS, BILITOT, PROT, ALBUMIN in the last 168 hours. No results  for input(s): LIPASE, AMYLASE in the last 168 hours. No results for input(s): AMMONIA in the last 168 hours. CBC: Recent Labs  Lab 10/04/20 1630 10/05/20 0451  WBC 10.1 8.2  NEUTROABS 8.4*  --   HGB 13.8 12.7*  HCT 42.3 38.4*  MCV 95.3 93.7  PLT 157 210   Cardiac Enzymes: No results for input(s): CKTOTAL, CKMB, CKMBINDEX, TROPONINI in the last 168 hours. BNP: Invalid input(s): POCBNP CBG: Recent Labs  Lab 10/05/20 0558  GLUCAP 114*   D-Dimer No results for input(s): DDIMER in the last 72 hours. Hgb A1c No results  for input(s): HGBA1C in the last 72 hours. Lipid Profile No results for input(s): CHOL, HDL, LDLCALC, TRIG, CHOLHDL, LDLDIRECT in the last 72 hours. Thyroid function studies No results for input(s): TSH, T4TOTAL, T3FREE, THYROIDAB in the last 72 hours.  Invalid input(s): FREET3 Anemia work up No results for input(s): VITAMINB12, FOLATE, FERRITIN, TIBC, IRON, RETICCTPCT in the last 72 hours. Urinalysis    Component Value Date/Time   COLORURINE DARK YELLOW 01/26/2020 1456   APPEARANCEUR CLEAR 01/26/2020 1456   LABSPEC 1.024 01/26/2020 1456   PHURINE 6.0 01/26/2020 1456   GLUCOSEU NEGATIVE 01/26/2020 1456   HGBUR NEGATIVE 01/26/2020 1456   BILIRUBINUR NEGATIVE 01/01/2017 0943   KETONESUR NEGATIVE 01/26/2020 1456   PROTEINUR NEGATIVE 01/26/2020 1456   UROBILINOGEN 0.2 09/12/2014 1100   NITRITE NEGATIVE 01/26/2020 1456   LEUKOCYTESUR NEGATIVE 01/26/2020 1456   Sepsis Labs Invalid input(s): PROCALCITONIN,  WBC,  LACTICIDVEN Microbiology Recent Results (from the past 240 hour(s))  SARS CORONAVIRUS 2 (TAT 6-24 HRS) Nasopharyngeal Nasopharyngeal Swab     Status: None   Collection Time: 10/04/20  4:48 PM   Specimen: Nasopharyngeal Swab  Result Value Ref Range Status   SARS Coronavirus 2 NEGATIVE NEGATIVE Final    Comment: (NOTE) SARS-CoV-2 target nucleic acids are NOT DETECTED.  The SARS-CoV-2 RNA is generally detectable in upper and lower respiratory specimens during the acute phase of infection. Negative results do not preclude SARS-CoV-2 infection, do not rule out co-infections with other pathogens, and should not be used as the sole basis for treatment or other patient management decisions. Negative results must be combined with clinical observations, patient history, and epidemiological information. The expected result is Negative.  Fact Sheet for Patients: SugarRoll.be  Fact Sheet for Healthcare  Providers: https://www.woods-mathews.com/  This test is not yet approved or cleared by the [place] FDA and  has been authorized for detection and/or diagnosis of SARS-CoV-2 by FDA under an Emergency Use Authorization (EUA). This EUA will remain  in effect (meaning this test can be used) for the duration of the COVID-19 declaration under Se ction 564(b)(1) of the Act, 21 U.S.C. section 360bbb-3(b)(1), unless the authorization is terminated or revoked sooner.  Performed at [facility], [place] [address], [place], [place] [address]     Time coordinating discharge: Over 30 minutes  SIGNED:  Lorella Nimrod, MD  [facility] 10/05/2020, 4:26 PM  If 7PM-7AM, please contact night-coverage [email or web address]  This record has been created using Systems analyst. Errors have been sought and corrected,but may not always be located. Such creation errors do not reflect on the standard of care.

## 2020-10-05 NOTE — Progress Notes (Addendum)
TRN round- Pt provided incentive spherometer. Education provided on use by Santiago Glad, [employer] and this TRN.  Pt demonstrated proper use.

## 2020-10-05 NOTE — Progress Notes (Signed)
*  PRELIMINARY RESULTS* Echocardiogram 2D Echocardiogram has been performed.  Luisa Hart RDCS 10/05/2020, 1:36 PM

## 2020-10-11 ENCOUNTER — Telehealth: Payer: Self-pay | Admitting: *Deleted

## 2020-10-11 NOTE — Telephone Encounter (Signed)
Called patient on 10/11/2020 , 12:31 PM in an attempt to reach the patient for a hospital follow up. Spoke with spouse, who states his pain level has decreased since discharge.  Admit date: 10/04/20 Discharge: 10/05/20   He does not have any questions or concerns about medications from the hospital admission. The patient's medications were reviewed over the phone, they were counseled to bring in all current medications to the hospital follow up visit.   I advised the patient to call if any questions or concerns arise about the hospital admission or medications    Home health was not started in the hospital.  All questions were answered and a follow up appointment was made. Patient has an appointment 10/18/2020, which was the first available. Spouse expressed she did not want him to bring him to office any earlier, due to his pain when he moves.   Prior to Admission medications   Medication Sig Start Date End Date Taking? Authorizing Provider  Ascorbic Acid (VITAMIN C) 1000 MG tablet Take 1,000 mg by mouth daily.    [provider]  aspirin 325 MG tablet Take 325 mg by mouth daily.    [provider]  B Complex Vitamins (B COMPLEX PO) Take 1 tablet by mouth See admin instructions. Takes M,W,F    [provider]  buPROPion (WELLBUTRIN XL) 150 MG 24 hr tablet TAKE 1 TABLET EVERY MORNING FOR MOOD, FOCUS & CONCENTRATION 12/06/19   Unk Pinto, MD  EPINEPHrine (EPIPEN 2-PAK) 0.3 mg/0.3 mL IJ SOAJ injection Inject 0.3 mLs (0.3 mg total) into the muscle once. 03/06/14   Leonard Schwartz, MD  ezetimibe (ZETIA) 10 MG tablet Take  1 tablet  Daily  for Cholesterol 08/27/20   Unk Pinto, MD  Flaxseed, Linseed, (FLAX SEED OIL) 1000 MG CAPS Take 2,000 mg by mouth daily.    [provider]  gemfibrozil (LOPID) 600 MG tablet TAKE 1 TABLET TWICE A DAY FOR CHOLESTEROL 10/22/19   Unk Pinto, MD  hydrochlorothiazide (HYDRODIURIL) 12.5 MG tablet TAKE 1 TABLET BY MOUTH  EVERY DAY 04/12/20   Liane Comber, NP  HYDROcodone-acetaminophen (NORCO/VICODIN) 5-325 MG tablet Take 1-2 tablets by mouth every 4 (four) hours as needed for moderate pain (Hold & Call MD if SBP<90, HR<65, RR<10, O2<90, or altered mental status.). 10/05/20   Lorella Nimrod, MD  levothyroxine (SYNTHROID) 50 MCG tablet TAKE 1 TABLET BY MOUTH EVERY DAY 02/10/20   Vladimir Crofts, PA-C  MAGNESIUM PO Take 750 mg by mouth daily.     [provider]  methocarbamol (ROBAXIN) 750 MG tablet Take 1 tablet (750 mg total) by mouth every 6 (six) hours as needed for muscle spasms (rib pain). 10/05/20   Lorella Nimrod, MD  metoprolol tartrate (LOPRESSOR) 25 MG tablet TAKE 1 TABLET TWICE A DAY FOR BP 10/08/19   Unk Pinto, MD  Multiple Vitamin (MULTIVITAMIN) capsule Take by mouth.    [provider]  olmesartan (BENICAR) 40 MG tablet Take  1 tablet  Daily  for BP 08/27/20   Unk Pinto, MD  sertraline (ZOLOFT) 100 MG tablet Take one tablet (100mg ) daily for mood. 08/31/20   Liane Comber, NP

## 2020-10-18 ENCOUNTER — Other Ambulatory Visit: Payer: Self-pay

## 2020-10-18 ENCOUNTER — Ambulatory Visit (INDEPENDENT_AMBULATORY_CARE_PROVIDER_SITE_OTHER): Payer: PPO | Admitting: Internal Medicine

## 2020-10-18 VITALS — BP 122/80 | HR 84 | Temp 96.3°F | Resp 17 | Ht 68.0 in | Wt 219.4 lb

## 2020-10-18 DIAGNOSIS — I10 Essential (primary) hypertension: Secondary | ICD-10-CM | POA: Diagnosis not present

## 2020-10-18 DIAGNOSIS — Z79899 Other long term (current) drug therapy: Secondary | ICD-10-CM | POA: Diagnosis not present

## 2020-10-18 DIAGNOSIS — R55 Syncope and collapse: Secondary | ICD-10-CM

## 2020-10-18 DIAGNOSIS — E782 Mixed hyperlipidemia: Secondary | ICD-10-CM | POA: Diagnosis not present

## 2020-10-18 DIAGNOSIS — S2242XD Multiple fractures of ribs, left side, subsequent encounter for fracture with routine healing: Secondary | ICD-10-CM

## 2020-10-18 NOTE — Progress Notes (Signed)
Future Appointments  Date Time Provider [place]  10/18/2020 11:30 AM Unk Pinto, MD [facility] None  12/07/2020  9:30 AM Unk Pinto, MD [facility] None  03/15/2021 10:00 AM Liane Comber, NP [facility] None  08/29/2021 11:30 AM Liane Comber, NP [facility] None    PostComprehensive Outpatient [facility] Follow-Up     This very nice 70 y.o. MWM was admitted to the hospital on  10/04/2020  and patient was discharged from the hospital 13 days ago  on 10/05/2020. The patient now presents for follow up for transition from recent hospitalization.  The day after discharge  our clinical staff contacted the patient to assure stability and schedule a follow up appointment. The discharge summary, medications and diagnostic test results were reviewed before meeting with the patient. The patient was admitted for:   Near syncope  Closed fracture of multiple ribs of left side  Essential hypertension  Hyperlipidemia, mixed      Patient was hospitalized after a fall at home and presented to the ER with Fx's of Left 7-8-9 & 10th ribs and was admitted for overnight observation and was discharged home the following morning 13 days ago:     Hospitalization discharge instructions and medications are reconciled with the patient.      Patient is also followed with Hypertension, Hyperlipidemia, Pre-Diabetes and Vitamin D Deficiency.      Patient is treated for HTN & BP has been controlled at home. Today's BP is at goal - 122/80. Patient has had no complaints of any cardiac type chest pain, palpitations, dyspnea/orthopnea/PND, dizziness, claudication, or dependent edema.     Hyperlipidemia is controlled with diet & meds. Patient denies myalgias or other med SE's. Last Lipids were at goal except elevated Trig's:  Lab Results  Component Value Date   CHOL 149 08/29/2020   HDL 30 (L) 08/29/2020   LDLCALC 76 08/29/2020   TRIG 353 (H) 08/29/2020   CHOLHDL 5.0 (H) 08/29/2020       Also, the patient has history of  T2_NIDDM PreDiabetes and has had no symptoms of reactive hypoglycemia, diabetic polys, paresthesias or visual blurring.  Last A1c was  Lab Results  Component Value Date   HGBA1C 6.0 (H) 08/29/2020      Further, the patient also has history of Vitamin D Deficiency and supplements vitamin D without any suspected side-effects. Last vitamin D was   Lab Results  Component Value Date   VD25OH 79 08/29/2020   Current Outpatient Medications on File Prior to Visit  Medication Sig  . VITAMIN C 1000 MG tablet Take  daily.  Marland Kitchen aspirin 325 MG tablet Take 3daily.  . B Complex Vitamins  Take 1 tablet  M,W,F  . buPROPion XL 150 MG t TAKE 1 TABLET EVERY MORNING   . EPIPEN 2-PAK Inject 0.3 mLs (0.3 mg total) into the muscle once.  . ezetimibe  10 MG tablet Take  1 tablet  Daily  for Cholesterol  . FLAX SEED OIL 1000 MG  Take 2,000 mg by mouth daily.  Marland Kitchen gemfibrozil  600 MG tablet TAKE 1 TABLET TWICE A DAY   . hydrochlorothiazide  12.5 MG tablet TAKE 1 TABLET  EVERY DAY  . HYDROcodone-acetaminophen  5-325 MG  Take 1-2 tablets every 4 hours as needed   . levothyroxine  50 MCG tablet TAKE 1 TABLET  EVERY DAY  . MAGNESIUM  Take 750 mg by mouth daily.   . methocarbamol  750 MG tablet Take 1 tablet  every 6  hours as needed   .  metoprolol tartrate 25 MG tablet TAKE 1 TABLET TWICE A DAY FOR BP  . Multiple Vitamin Take by mouth.  . olmesartan  40 MG tablet Take  1 tablet  Daily  for BP  . sertraline  100 MG tablet Take one tablet daily for mood.    Allergies  Allergen Reactions  . Ace Inhibitors Other (See Comments)    cough  . Bee Venom Other (See Comments)   PMHx:   Past Medical History:  Diagnosis Date  . Cardiomegaly   . Colon polyp   . Depression   . Fatty liver disease, nonalcoholic   . Gallstones   . Hypertension   . Hypogonadism male   . Mixed hyperlipidemia   . Obesity   . Pancreas (digestive gland) works poorly   . Prediabetes   . Sleep apnea   . Testosterone deficiency 08/03/2009   . Thyroid disease   . Vitamin D deficiency    Immunization History  Administered Date(s) Administered  . Influenza Split 03/06/2012, 02/24/2013, 03/18/2014  . Influenza, High Dose Seasonal PF 02/12/2016, 04/09/2017, 02/15/2019, 03/21/2020  . Influenza-Unspecified 03/03/2015, 02/04/2018  . PFIZER(Purple Top)SARS-COV-2 Vaccination 07/08/2019, 08/12/2019, 03/14/2020  . PPD Test 07/26/2013  . Pneumococcal Conjugate-13 02/12/2016  . Pneumococcal Polysaccharide-23 07/26/2013, 01/26/2019  . Pneumococcal-Unspecified 05/04/2004  . Td 04/07/2019  . Tdap 07/24/2011  . Zoster 12/02/2013  . Zoster Recombinat (Shingrix) 01/28/2019, 04/20/2019   Past Surgical History:  Procedure Laterality Date  . ANKLE FRACTURE SURGERY Left 1986  . APPENDECTOMY  1964  . CARPAL TUNNEL RELEASE Bilateral 1996  . CHOLECYSTECTOMY  1976  . COLONOSCOPY    . POLYPECTOMY    . TIBIA IM NAIL INSERTION Right 03/21/2016   Procedure: INTRAMEDULLARY (IM) NAIL TIBIAL;  Surgeon: Renette Butters, MD;  Location: [place];  Service: Orthopedics;  Laterality: Right;   FHx:    Reviewed / unchanged  SHx:    Reviewed / unchanged  Systems Review:  Constitutional: Denies fever, chills, wt changes, headaches, insomnia, fatigue, night sweats, change in appetite. Eyes: Denies redness, blurred vision, diplopia, discharge, itchy, watery eyes.  ENT: Denies discharge, congestion, post nasal drip, epistaxis, sore throat, earache, hearing loss, dental pain, tinnitus, vertigo, sinus pain, snoring.  CV: Denies chest pain, palpitations, irregular heartbeat, syncope, dyspnea, diaphoresis, orthopnea, PND, claudication or edema. Respiratory: denies cough, dyspnea, DOE, pleurisy, hoarseness, laryngitis, wheezing.  Gastrointestinal: Denies dysphagia, odynophagia, heartburn, reflux, water brash, abdominal pain or cramps, nausea, vomiting, bloating, diarrhea, constipation, hematemesis, melena, hematochezia  or hemorrhoids. Genitourinary: Denies  dysuria, frequency, urgency, nocturia, hesitancy, discharge, hematuria or flank pain. Musculoskeletal: Denies arthralgias, myalgias, stiffness, jt. swelling, pain, limping or strain/sprain.  Skin: Denies pruritus, rash, hives, warts, acne, eczema or change in skin lesion(s). Neuro: No weakness, tremor, incoordination, spasms, paresthesia or pain. Psychiatric: Denies confusion, memory loss or sensory loss. Endo: Denies change in weight, skin or hair change.  Heme/Lymph: No excessive bleeding, bruising or enlarged lymph nodes.  Physical Exam  BP 122/80   Pulse 84   Temp (!) 96.3 F (35.7 C)   Resp 17   Ht 5\' 8"  (1.727 m)   Wt 219 lb 6.4 oz (99.5 kg)   SpO2 99%   BMI 33.36 kg/m   Appears well nourished, well groomed  and in no distress.  Eyes: PERRLA, EOMs, conjunctiva no swelling or erythema. Sinuses: No frontal/maxillary tenderness ENT/Mouth: EAC's clear, TM's nl w/o erythema, bulging. Nares clear w/o erythema, swelling, exudates. Oropharynx clear without erythema or exudates. Oral hygiene is good. Tongue normal,  non obstructing. Hearing intact.  Neck: Supple. Thyroid nl. Car 2+/2+ without bruits, nodes or JVD. Chest: Respirations nl with BS clear & equal w/o rales, rhonchi, wheezing or stridor.  Cor: Heart sounds normal w/ regular rate and rhythm without sig. murmurs, gallops, clicks or rubs. Peripheral pulses normal and equal  without edema.  Abdomen: Soft & bowel sounds normal. Non-tender w/o guarding, rebound, hernias, masses or organomegaly.  Lymphatics: Unremarkable.  Musculoskeletal: Full ROM all peripheral extremities, joint stability, 5/5 strength and normal gait.  Skin: Warm, dry without exposed rashes, lesions or ecchymosis apparent.  Neuro: Cranial nerves intact, reflexes equal bilaterally. Sensory-motor testing grossly intact. Tendon reflexes grossly intact.  Pysch: Alert & oriented x 3.  Insight and judgement nl & appropriate. No ideations.  Assessment and Plan:  1.  Near syncope  - CBC with Differential/Platelet - COMPLETE METABOLIC PANEL WITH GFR - Magnesium  2. Closed fracture of multiple ribs of left side    3. Essential hypertension  - Continue medication, monitor blood pressure at home.  - Continue DASH diet.  Reminder to go to the ER if any CP,  SOB, nausea, dizziness, severe HA, changes vision/speech.  - CBC with Differential/Platelet - COMPLETE METABOLIC PANEL WITH GFR - Magnesium  4. Hyperlipidemia, mixed  - Continue diet/meds, exercise,& lifestyle modifications.  - Continue monitor periodic cholesterol/liver & renal functions    5. Medication management - CBC with Differential/Platelet - COMPLETE METABOLIC PANEL WITH GFR - Magnesium        Discussed  regular exercise, BP monitoring, weight control to achieve/maintain BMI less than 25 and discussed meds and SE's. Recommended labs to assess and monitor clinical status with further disposition pending results of labs. Over 30 minutes of exam, counseling, chart review was performed.   Kirtland Bouchard, MD

## 2020-10-19 LAB — COMPLETE METABOLIC PANEL WITH GFR
AG Ratio: 1.5 (calc) (ref 1.0–2.5)
ALT: 19 U/L (ref 9–46)
AST: 23 U/L (ref 10–35)
Albumin: 4.3 g/dL (ref 3.6–5.1)
Alkaline phosphatase (APISO): 81 U/L (ref 35–144)
BUN: 20 mg/dL (ref 7–25)
CO2: 29 mmol/L (ref 20–32)
Calcium: 10 mg/dL (ref 8.6–10.3)
Chloride: 104 mmol/L (ref 98–110)
Creat: 1 mg/dL (ref 0.70–1.25)
GFR, Est African American: 89 mL/min/{1.73_m2} (ref >=60)
GFR, Est Non African American: 76 mL/min/{1.73_m2} (ref >=60)
Globulin: 2.9 g/dL (calc) (ref 1.9–3.7)
Glucose, Bld: 145 mg/dL — ABNORMAL HIGH (ref 65–99)
Potassium: 4.6 mmol/L (ref 3.5–5.3)
Sodium: 142 mmol/L (ref 135–146)
Total Bilirubin: 0.6 mg/dL (ref 0.2–1.2)
Total Protein: 7.2 g/dL (ref 6.1–8.1)

## 2020-10-19 LAB — CBC WITH DIFFERENTIAL/PLATELET
Absolute Monocytes: 437 cells/uL (ref 200–950)
Basophils Absolute: 28 cells/uL (ref 0–200)
Basophils Relative: 0.5 %
Eosinophils Absolute: 151 cells/uL (ref 15–500)
Eosinophils Relative: 2.7 %
HCT: 38.9 % (ref 38.5–50.0)
Hemoglobin: 13.1 g/dL — ABNORMAL LOW (ref 13.2–17.1)
Lymphs Abs: 683 cells/uL — ABNORMAL LOW (ref 850–3900)
MCH: 31.4 pg (ref 27.0–33.0)
MCHC: 33.7 g/dL (ref 32.0–36.0)
MCV: 93.3 fL (ref 80.0–100.0)
MPV: 10.2 fL (ref 7.5–12.5)
Monocytes Relative: 7.8 %
Neutro Abs: 4301 cells/uL (ref 1500–7800)
Neutrophils Relative %: 76.8 %
Platelets: 324 10*3/uL (ref 140–400)
RBC: 4.17 10*6/uL — ABNORMAL LOW (ref 4.20–5.80)
RDW: 13.6 % (ref 11.0–15.0)
Total Lymphocyte: 12.2 %
WBC: 5.6 10*3/uL (ref 3.8–10.8)

## 2020-10-19 LAB — MAGNESIUM: Magnesium: 2.2 mg/dL (ref 1.5–2.5)

## 2020-10-19 NOTE — Progress Notes (Signed)
============================================================ -   Test results slightly outside the reference range are not unusual. If there is anything important, I will review this with you,  otherwise it is considered normal test values.  If you have further questions,  please do not hesitate to contact me at the office or via My Chart.  ============================================================ ============================================================  -  All Else - CBC - Kidneys - Electrolytes - Liver & Magnesium -   - all  Normal / OK ===========================================================

## 2020-10-22 ENCOUNTER — Other Ambulatory Visit: Payer: Self-pay | Admitting: Adult Health

## 2020-10-22 ENCOUNTER — Other Ambulatory Visit: Payer: Self-pay | Admitting: Internal Medicine

## 2020-10-23 ENCOUNTER — Encounter: Payer: Self-pay | Admitting: Internal Medicine

## 2020-10-26 NOTE — Progress Notes (Signed)
HPI Male former smoker followed for OSA complicated by obesity, HBP NPSG 08/25/95- Severe obstructive sleep apnea, AHI 58/hr. ---------------------------------------------------------------------------   10/06/19- 70 year old male former smoker followed for OSA, complicated by obesity, HBP, hypothyroid,  CPAP auto 5-15/ [facility] Download compliance 100%, AHI 4.1/ hr Body weight today 220 lbs Had 2 Phizer Covax He is pretty sure he and wife had Covid infection last winter. She later tested positive, he did not, but both sick at same time. Now fully resolved.  Doing well with CPAP, but not happy with [facility] billing so he gets his supplies on-line.   10/27/20-  70 year old male former smoker(14 pkyrs) followed for OSA, complicated by obesity, HTN, hypothyroid, Hyperlipidemia, Sinus bradycardia,  CPAP auto 5-15/ [facility] Download- compliance 100%, AHI 5.6/ hr Body weight today- 214 lbs Covid vax- 4 Phizer Fell in May- multiple L rib fxs. -----Patient feeling good overall, needs new mask and wants to switch DME's. Sleeping good He describes his CPAP as "friend". Download reviewed and compared with original. He needs replacement mask. Had problems with billing from DME at time of merger [employer] [facility] [employer]. They will let us know if there are further problems. Minor stable chronic cough and no residual chest wall pain after rib fractures.  CXR 10/05/20-  IMPRESSION: Displaced rib fractures on the left, stable from 1 day prior. There is left base atelectasis. No pneumothorax or pleural effusion evident. No edema or airspace opacity. Stable cardiac silhouette.  ROS- see HPI + = positive Constitutional:   No-   weight loss, night sweats, fevers, chills, fatigue, lassitude. HEENT:   No-  headaches, difficulty swallowing, tooth/dental problems, sore throat,       No-  sneezing, itching, ear ache, nasal congestion, post nasal drip,  CV:  No-   chest pain, orthopnea, PND, swelling in lower extremities,  anasarca, dizziness, palpitations Resp: No-   shortness of breath with exertion or at rest.              No-   productive cough,  + non-productive cough,  No- coughing up of blood.              No-   change in color of mucus.  No- wheezing.   Skin: No-   rash or lesions. GI:  No-   heartburn, indigestion, abdominal pain, nausea, vomiting,  GU: . MS:  No-   joint pain or swelling.  Neuro-     nothing unusual Psych:  No- change in mood or affect. No depression or anxiety.  No memory loss.  OBJ General- Alert, Oriented, Affect-appropriate, Distress- none acute; +overweight. + Full beard Skin- rash-none, lesions- none, excoriation- none Lymphadenopathy- none Head- atraumatic            Eyes- Gross vision intact, PERRLA, conjunctivae clear secretions            Ears- Hearing, canals-normal            Nose- Clear, no-Septal dev, mucus, polyps, erosion, perforation             Throat- Mallampati III-IV , mucosa , drainage- none, tonsils- atrophic Neck- flexible , trachea midline, no stridor , thyroid nl, carotid no bruit Chest - symmetrical excursion , unlabored           Heart/CV- RRR , no murmur , no gallop  , no rub, nl s1 s2                           -  JVD- none , edema- none, stasis changes- none, varices- none           Lung- clear to P&A, wheeze- none, cough- none , dullness-none, rub- none           Chest wall-  Abd-  Br/ Gen/ Rectal- Not done, not indicated Extrem- cyanosis- none, clubbing, none, atrophy- none, strength- nl Neuro- grossly intact to observation     

## 2020-10-27 ENCOUNTER — Encounter: Payer: Self-pay | Admitting: Internal Medicine

## 2020-10-27 ENCOUNTER — Other Ambulatory Visit: Payer: Self-pay

## 2020-10-27 ENCOUNTER — Ambulatory Visit: Payer: PPO | Admitting: Internal Medicine

## 2020-10-27 VITALS — BP 116/80 | HR 78 | Temp 98.5°F | Ht 68.0 in | Wt 214.4 lb

## 2020-10-27 DIAGNOSIS — G4733 Obstructive sleep apnea (adult) (pediatric): Secondary | ICD-10-CM

## 2020-10-27 DIAGNOSIS — S2242XA Multiple fractures of ribs, left side, initial encounter for closed fracture: Secondary | ICD-10-CM

## 2020-10-27 NOTE — Assessment & Plan Note (Signed)
Benefits from CPAP with good compliance and satisfactory control. Machine is not quite using its full available pressure range at current settings. Plan- replace mask and supplies now, continue auto 5-15

## 2020-10-27 NOTE — Assessment & Plan Note (Signed)
He is able to cough and take deep breaths as needed without chest wall pain. We can assess later if there is concern.

## 2020-10-27 NOTE — Patient Instructions (Signed)
Order-  DME [facility]- please replace mask of choice and supplies Continue auto 5-15  Please call if we can help

## 2020-11-05 ENCOUNTER — Other Ambulatory Visit: Payer: Self-pay | Admitting: Internal Medicine

## 2020-11-06 DIAGNOSIS — G4730 Sleep apnea, unspecified: Secondary | ICD-10-CM | POA: Diagnosis not present

## 2020-11-06 DIAGNOSIS — G4733 Obstructive sleep apnea (adult) (pediatric): Secondary | ICD-10-CM | POA: Diagnosis not present

## 2020-12-06 NOTE — Progress Notes (Signed)
Future Appointments  Date Time Provider [place]  12/07/2020  9:30 AM Unk Pinto, MD [facility] None  03/15/2021  - CPE  10:00 AM Liane Comber, NP [facility] None  08/29/2021   - Wellness 11:30 AM Liane Comber, NP [facility] None  10/29/2021  9:00 AM Deneise Lever, MD [facility] None    History of Present Illness:       This very nice 70 y.o. MWM  presents for 3 month follow up with HTN, HLD, Hypothyroid, Pre-Diabetes and Vitamin D Deficiency.   Patient is on CPAP for OSA with restorative sleep. Patient also has hx/o Hypothyroidism & is on replacement therapy.        Patient is treated for HTN  (1980's) & BP has been controlled at home. Today's BP: 124/80. Patient has had no complaints of any cardiac type chest pain, palpitations, dyspnea / orthopnea / PND, dizziness, claudication, or dependent edema.       Hyperlipidemia is controlled with diet & meds.  Patient has hx/o Statin intolerance wih elevated CPK Patient denies myalgias or other med SE's. Last Lipids were at goal except elevated Trig's:  Lab Results  Component Value Date   CHOL 149 08/29/2020   HDL 30 (L) 08/29/2020   LDLCALC 76 08/29/2020   TRIG 353 (H) 08/29/2020   CHOLHDL 5.0 (H) 08/29/2020     Also, the patient has history of PreDiabetes (A1c 5.9% /2015) and has had no symptoms of reactive hypoglycemia, diabetic polys, paresthesias or visual blurring.  Last A1c was not at goal:  Lab Results  Component Value Date   HGBA1C 6.0 (H) 08/29/2020                                                       Further, the patient also has history of Vitamin D Deficiency and supplements vitamin D without any suspected side-effects. Last vitamin D was was at goal:   Lab Results  Component Value Date   VD25OH 79 08/29/2020     Current Outpatient Medications on File Prior to Visit  Medication Sig   Ascorbic Acid (VITAMIN C) 1000 MG tablet Take 1,000 mg by mouth daily.   aspirin EC 81 MG tablet  Take 81 mg by mouth daily. Swallow whole.   B Complex Vitamins (B COMPLEX PO) Take 1 tablet by mouth See admin instructions. Takes M,W,F   buPROPion (WELLBUTRIN XL) 150 MG 24 hr tablet TAKE 1 TABLET EVERY MORNING FOR MOOD, FOCUS & CONCENTRATION   EPINEPHrine (EPIPEN 2-PAK) 0.3 mg/0.3 mL IJ SOAJ injection Inject 0.3 mLs (0.3 mg total) into the muscle once.   ezetimibe (ZETIA) 10 MG tablet Take  1 tablet  Daily  for Cholesterol   Flaxseed, Linseed, (FLAX SEED OIL) 1000 MG CAPS Take 2,000 mg by mouth daily.   gemfibrozil (LOPID) 600 MG tablet TAKE 1 TABLET TWICE A DAY FOR CHOLESTEROL   hydrochlorothiazide  12.5 MG tablet Take  1 tablet  Daily  for BP and Fluid Retention / Ankle swelling   HYDROcodone-acetaminophen (NORCO/VICODIN) 5-325 MG tablet Take 1-2 tablets by mouth every 4 (four) hours as needed for moderate pain (Hold & Call MD if SBP<90, HR<65, RR<10, O2<90, or altered mental status.).   levothyroxine 50 MCG tablet TAKE 1 TABLET BY MOUTH EVERY DAY   MAGNESIUM PO  Take 750 mg by mouth daily.    methocarbamol (ROBAXIN) 750 MG tablet Take 1 tablet (750 mg total) by mouth every 6 (six) hours as needed for muscle spasms (rib pain).   metoprolol tartrate (LOPRESSOR) 25 MG tablet Take  1 tablet  2 x /day (every 12 hours) for BP   Multiple Vitamin (MULTIVITAMIN) capsule Take by mouth.   olmesartan (BENICAR) 40 MG tablet Take  1 tablet  Daily  for BP   sertraline (ZOLOFT) 100 MG tablet Take one tablet (100mg ) daily for mood.   No current facility-administered medications on file prior to visit.     Allergies  Allergen Reactions   Ace Inhibitors Other (See Comments)    cough   Bee Venom Other (See Comments)     PMHx:   Past Medical History:  Diagnosis Date   Cardiomegaly    Colon polyp    Depression    Fatty liver disease, nonalcoholic    Gallstones    Hypertension    Hypogonadism male    Mixed hyperlipidemia    Obesity    Pancreas (digestive gland) works poorly    Prediabetes     Sleep apnea    Testosterone deficiency 08/03/2009   Thyroid disease    Vitamin D deficiency      Immunization History  Administered Date(s) Administered   Influenza Split 03/06/2012, 02/24/2013, 03/18/2014   Influenza, High Dose  02/12/2016, 04/09/2017, 02/15/2019, 03/21/2020   Influenza 03/03/2015, 02/04/2018   PFIZER  Covid-19 Vac 10/24/2020   PFIZER SARS-COV-2 Vacc 07/08/2019, 08/12/2019, 03/14/2020   PPD Test 07/26/2013   Pneumococcal -13 02/12/2016   Pneumococcal -23 07/26/2013, 01/26/2019   Pneumococcal-23 05/04/2004   Td 04/07/2019   Tdap 07/24/2011   Zoster Recombinat (Shingrix) 01/28/2019, 04/20/2019   Zoster, Live 12/02/2013     Past Surgical History:  Procedure Laterality Date   ANKLE FRACTURE SURGERY Left [place] Bilateral 1996   CHOLECYSTECTOMY  1976   COLONOSCOPY     POLYPECTOMY     TIBIA IM NAIL INSERTION Right 03/21/2016   Procedure: INTRAMEDULLARY (IM) NAIL TIBIAL;  Surgeon: Renette Butters, MD;  Location: [facility];  Service: Orthopedics;  Laterality: Right;    FHx:    Reviewed / unchanged  SHx:    Reviewed / unchanged   Systems Review:  Constitutional: Denies fever, chills, wt changes, headaches, insomnia, fatigue, night sweats, change in appetite. Eyes: Denies redness, blurred vision, diplopia, discharge, itchy, watery eyes.  ENT: Denies discharge, congestion, post nasal drip, epistaxis, sore throat, earache, hearing loss, dental pain, tinnitus, vertigo, sinus pain, snoring.  CV: Denies chest pain, palpitations, irregular heartbeat, syncope, dyspnea, diaphoresis, orthopnea, PND, claudication or edema. Respiratory: denies cough, dyspnea, DOE, pleurisy, hoarseness, laryngitis, wheezing.  Gastrointestinal: Denies dysphagia, odynophagia, heartburn, reflux, water brash, abdominal pain or cramps, nausea, vomiting, bloating, diarrhea, constipation, hematemesis, melena, hematochezia  or hemorrhoids. Genitourinary:  Denies dysuria, frequency, urgency, nocturia, hesitancy, discharge, hematuria or flank pain. Musculoskeletal: Denies arthralgias, myalgias, stiffness, jt. swelling, pain, limping or strain/sprain.  Skin: Denies pruritus, rash, hives, warts, acne, eczema or change in skin lesion(s). Neuro: No weakness, tremor, incoordination, spasms, paresthesia or pain. Psychiatric: Denies confusion, memory loss or sensory loss. Endo: Denies change in weight, skin or hair change.  Heme/Lymph: No excessive bleeding, bruising or enlarged lymph nodes.  Physical Exam  BP 124/80   Pulse 62   Temp (!) 96.6 F (35.9 C)   Resp 16   Ht 5\' 7"  (  1.702 m)   Wt 218 lb (98.9 kg)   SpO2 95%   BMI 34.14 kg/m   Appears  over nourished, well groomed  and in no distress.  Eyes: PERRLA, EOMs, conjunctiva no swelling or erythema. Sinuses: No frontal/maxillary tenderness ENT/Mouth: EAC's clear, TM's nl w/o erythema, bulging. Nares clear w/o erythema, swelling, exudates. Oropharynx clear without erythema or exudates. Oral hygiene is good. Tongue normal, non obstructing. Hearing intact.  Neck: Supple. Thyroid not palpable. Car 2+/2+ without bruits, nodes or JVD. Chest: Respirations nl with BS clear & equal w/o rales, rhonchi, wheezing or stridor.  Cor: Heart sounds normal w/ regular rate and rhythm without sig. murmurs, gallops, clicks or rubs. Peripheral pulses normal and equal  without edema.  Abdomen: Soft & bowel sounds normal. Non-tender w/o guarding, rebound, hernias, masses or organomegaly.  Lymphatics: Unremarkable.  Musculoskeletal: Full ROM all peripheral extremities, joint stability, 5/5 strength and normal gait.  Skin: Warm, dry without exposed rashes, lesions or ecchymosis apparent.  Neuro: Cranial nerves intact, reflexes equal bilaterally. Sensory-motor testing grossly intact. Tendon reflexes grossly intact.  Pysch: Alert & oriented x 3.  Insight and judgement nl & appropriate. No ideations.  Assessment and  Plan:  1. Essential hypertension  - Continue medication, monitor blood pressure at home.  - Continue DASH diet.  Reminder to go to the ER if any CP,  SOB, nausea, dizziness, severe HA, changes vision/speech.   - CBC with Differential/Platelet - COMPLETE METABOLIC PANEL WITH GFR - Magnesium - TSH  2. Hyperlipidemia, mixed  - Continue diet/meds, exercise,& lifestyle modifications.  - Continue monitor periodic cholesterol/liver & renal functions    - Lipid panel - TSH  3. Abnormal glucose  - Continue diet, exercise  - Lifestyle modifications.  - Monitor appropriate labs   - Hemoglobin A1c - Insulin, random  4. Vitamin D deficiency  - Continue supplementation  - VITAMIN D 25 Hydroxy   5. OSA on CPAP   6. Hypothyroidism  - TSH  7. Medication management  - CBC with Differential/Platelet - COMPLETE METABOLIC PANEL WITH GFR - Magnesium - Lipid panel - TSH - Hemoglobin A1c - Insulin, random - VITAMIN D 25 Hydroxy          Discussed  regular exercise, BP monitoring, weight control to achieve/maintain BMI less than 25 and discussed med and SE's. Recommended labs to assess and monitor clinical status with further disposition pending results of labs.  I discussed the assessment and treatment plan with the patient. The patient was provided an opportunity to ask questions and all were answered. The patient agreed with the plan and demonstrated an understanding of the instructions.  I provided over 30 minutes of exam, counseling, chart review and  complex critical decision making.        The patient was advised to call back or seek an in-person evaluation if the symptoms worsen or if the condition fails to improve as anticipated.   Kirtland Bouchard, MD

## 2020-12-07 ENCOUNTER — Other Ambulatory Visit: Payer: Self-pay

## 2020-12-07 ENCOUNTER — Ambulatory Visit (INDEPENDENT_AMBULATORY_CARE_PROVIDER_SITE_OTHER): Payer: PPO | Admitting: Internal Medicine

## 2020-12-07 VITALS — BP 124/80 | HR 62 | Temp 96.6°F | Resp 16 | Ht 67.0 in | Wt 218.0 lb

## 2020-12-07 DIAGNOSIS — E039 Hypothyroidism, unspecified: Secondary | ICD-10-CM | POA: Diagnosis not present

## 2020-12-07 DIAGNOSIS — E782 Mixed hyperlipidemia: Secondary | ICD-10-CM

## 2020-12-07 DIAGNOSIS — Z9989 Dependence on other enabling machines and devices: Secondary | ICD-10-CM | POA: Diagnosis not present

## 2020-12-07 DIAGNOSIS — E559 Vitamin D deficiency, unspecified: Secondary | ICD-10-CM | POA: Diagnosis not present

## 2020-12-07 DIAGNOSIS — G4733 Obstructive sleep apnea (adult) (pediatric): Secondary | ICD-10-CM | POA: Diagnosis not present

## 2020-12-07 DIAGNOSIS — I10 Essential (primary) hypertension: Secondary | ICD-10-CM

## 2020-12-07 DIAGNOSIS — R7309 Other abnormal glucose: Secondary | ICD-10-CM | POA: Diagnosis not present

## 2020-12-07 DIAGNOSIS — Z79899 Other long term (current) drug therapy: Secondary | ICD-10-CM

## 2020-12-07 NOTE — Patient Instructions (Signed)

## 2020-12-08 LAB — COMPLETE METABOLIC PANEL WITH GFR
AG Ratio: 1.8 (calc) (ref 1.0–2.5)
ALT: 31 U/L (ref 9–46)
AST: 35 U/L (ref 10–35)
Albumin: 4.6 g/dL (ref 3.6–5.1)
Alkaline phosphatase (APISO): 54 U/L (ref 35–144)
BUN: 22 mg/dL (ref 7–25)
CO2: 30 mmol/L (ref 20–32)
Calcium: 10.1 mg/dL (ref 8.6–10.3)
Chloride: 105 mmol/L (ref 98–110)
Creat: 0.99 mg/dL (ref 0.70–1.35)
Globulin: 2.5 g/dL (calc) (ref 1.9–3.7)
Glucose, Bld: 101 mg/dL — ABNORMAL HIGH (ref 65–99)
Potassium: 4.8 mmol/L (ref 3.5–5.3)
Sodium: 144 mmol/L (ref 135–146)
Total Bilirubin: 0.4 mg/dL (ref 0.2–1.2)
Total Protein: 7.1 g/dL (ref 6.1–8.1)
eGFR: 82 mL/min/{1.73_m2} (ref >=60)

## 2020-12-08 LAB — CBC WITH DIFFERENTIAL/PLATELET
Absolute Monocytes: 439 cells/uL (ref 200–950)
Basophils Absolute: 41 cells/uL (ref 0–200)
Basophils Relative: 1.2 %
Eosinophils Absolute: 170 cells/uL (ref 15–500)
Eosinophils Relative: 5 %
HCT: 42.8 % (ref 38.5–50.0)
Hemoglobin: 14.3 g/dL (ref 13.2–17.1)
Lymphs Abs: 959 cells/uL (ref 850–3900)
MCH: 29.9 pg (ref 27.0–33.0)
MCHC: 33.4 g/dL (ref 32.0–36.0)
MCV: 89.4 fL (ref 80.0–100.0)
MPV: 10.8 fL (ref 7.5–12.5)
Monocytes Relative: 12.9 %
Neutro Abs: 1792 cells/uL (ref 1500–7800)
Neutrophils Relative %: 52.7 %
Platelets: 218 10*3/uL (ref 140–400)
RBC: 4.79 10*6/uL (ref 4.20–5.80)
RDW: 13.6 % (ref 11.0–15.0)
Total Lymphocyte: 28.2 %
WBC: 3.4 10*3/uL — ABNORMAL LOW (ref 3.8–10.8)

## 2020-12-08 LAB — HEMOGLOBIN A1C
Hgb A1c MFr Bld: 5.7 % of total Hgb — ABNORMAL HIGH (ref <5.7)
Mean Plasma Glucose: 117 mg/dL
eAG (mmol/L): 6.5 mmol/L

## 2020-12-08 LAB — LIPID PANEL
Cholesterol: 152 mg/dL (ref <200)
HDL: 33 mg/dL — ABNORMAL LOW (ref >=40)
LDL Cholesterol (Calc): 81 mg/dL (calc)
Non-HDL Cholesterol (Calc): 119 mg/dL (calc) (ref <130)
Total CHOL/HDL Ratio: 4.6 (calc) (ref <5.0)
Triglycerides: 319 mg/dL — ABNORMAL HIGH (ref <150)

## 2020-12-08 LAB — MAGNESIUM: Magnesium: 2.2 mg/dL (ref 1.5–2.5)

## 2020-12-08 LAB — INSULIN, RANDOM: Insulin: 55.8 u[IU]/mL — ABNORMAL HIGH

## 2020-12-08 LAB — VITAMIN D 25 HYDROXY (VIT D DEFICIENCY, FRACTURES): Vit D, 25-Hydroxy: 79 ng/mL (ref 30–100)

## 2020-12-08 LAB — TSH: TSH: 1.03 mIU/L (ref 0.40–4.50)

## 2020-12-09 NOTE — Progress Notes (Signed)
============================================================ -   Test results slightly outside the reference range are not unusual. If there is anything important, I will review this with you,  otherwise it is considered normal test values.  If you have further questions,  please do not hesitate to contact me at the office or via My Chart.  ============================================================ ============================================================  -  Total Chol = 152   and   LDL Chol = 81   - Both  Excellent   - Very low risk for Heart Attack  / Stroke ============================================================ ============================================================  -  But . . . . Triglycerides (   319   ) or fats in blood are too high  (goal is less than 150)    - Recommend avoid fried & greasy foods,  sweets / candy,   - Avoid white rice  (brown or wild rice or Quinoa is OK),   - Avoid white potatoes  (sweet potatoes are OK)   - Avoid anything made from white flour  - bagels, doughnuts, rolls, buns, biscuits, white and   wheat breads, pizza crust and traditional  pasta made of white flour & egg white  - (vegetarian pasta or spinach or wheat pasta is OK).    - Multi-grain bread is OK - like multi-grain flat bread or  sandwich thins.   - Avoid alcohol in excess.   - Exercise is also important. ============================================================ ============================================================  -  A1c = 5.7% - Much better - Almost back in the Normal NonDiabetic range  !  ============================================================ ============================================================  -  But Insulin = 55.8 is very elevated  !        (  Ideal or Goal is less than 20  !  )   - Elevated insulin  shows " Insulin Resistance" - a sign of early diabetes and  associated with a 300 % greater risk for heart attacks, strokes,  cancer &  Alzheimer type vascular dementia   - All this can be cured  and prevented with losing weight - get   Dr Fara Olden Fuhrman's book 'the End of Diabetes" and "the End of Dieting"  - and add many years of good health to your life. ============================================================ ============================================================  -  Vitamin D = 79 - Excellent  ============================================================ ============================================================  -  All Else - CBC - Kidneys - Electrolytes - Liver - Magnesium & Thyroid    - all  Normal / OK ============================================================ ============================================================  -  Keep up the [place] Work  ! ============================================================ ============================================================

## 2020-12-10 ENCOUNTER — Encounter: Payer: Self-pay | Admitting: Internal Medicine

## 2020-12-17 ENCOUNTER — Other Ambulatory Visit: Payer: Self-pay | Admitting: Internal Medicine

## 2021-01-15 DIAGNOSIS — L821 Other seborrheic keratosis: Secondary | ICD-10-CM | POA: Diagnosis not present

## 2021-01-15 DIAGNOSIS — D225 Melanocytic nevi of trunk: Secondary | ICD-10-CM | POA: Diagnosis not present

## 2021-01-15 DIAGNOSIS — D1801 Hemangioma of skin and subcutaneous tissue: Secondary | ICD-10-CM | POA: Diagnosis not present

## 2021-01-15 DIAGNOSIS — L570 Actinic keratosis: Secondary | ICD-10-CM | POA: Diagnosis not present

## 2021-01-15 DIAGNOSIS — L814 Other melanin hyperpigmentation: Secondary | ICD-10-CM | POA: Diagnosis not present

## 2021-01-25 ENCOUNTER — Encounter: Payer: PPO | Admitting: Adult Health

## 2021-02-19 ENCOUNTER — Other Ambulatory Visit: Payer: Self-pay | Admitting: Internal Medicine

## 2021-02-19 DIAGNOSIS — E039 Hypothyroidism, unspecified: Secondary | ICD-10-CM

## 2021-02-19 MED ORDER — LEVOTHYROXINE SODIUM 50 MCG PO TABS: ORAL_TABLET | ORAL | 3 refills | Status: DC

## 2021-03-04 ENCOUNTER — Other Ambulatory Visit: Payer: Self-pay | Admitting: Internal Medicine

## 2021-03-04 DIAGNOSIS — E782 Mixed hyperlipidemia: Secondary | ICD-10-CM

## 2021-03-04 DIAGNOSIS — I10 Essential (primary) hypertension: Secondary | ICD-10-CM

## 2021-03-04 MED ORDER — EZETIMIBE 10 MG PO TABS: ORAL_TABLET | ORAL | 3 refills | Status: DC

## 2021-03-15 ENCOUNTER — Encounter: Payer: PPO | Admitting: Adult Health

## 2021-04-05 NOTE — Progress Notes (Signed)
CPE  Assessment and Plan: Encounter for general adult medical examination with abnormal findings Due Yearly  Essential hypertension Monitor blood pressure at home; call if consistently over 130/80 Continue DASH diet.   Reminder to go to the ER if any CP, SOB, nausea, dizziness, severe HA, changes vision/speech, left arm numbness and tingling and jaw pain. - CBC with Differential/Platelet - CMP/GFR   Hypothyroidism, unspecified hypothyroidism type Hypothyroidism-check TSH level, continue medications the same, reminded to take on an empty stomach 30-48mins before food.  - TSH  Prediabetes Discussed general issues about diabetes pathophysiology and management., Educational material distributed., Suggested low cholesterol diet., Encouraged aerobic exercise., Discussed foot care., Reminded to get yearly retinal exam. - Hemoglobin A1c   Hyperlipidemia/ statin myopathy -continue medications, check lipids, decrease fatty foods, increase activity.  - Lipid panel  Morbid Obesity (BMI 30+ with OSA and other co morbidities) - follow up 3 months for progress monitoring - increase veggies, decrease carbs - long discussion about weight loss, diet, and exercise - weight goal of <200 lb set  Prediabetes Discussed disease and risks Discussed diet/exercise, weight management  A1C   Vitamin D deficiency At goal at recent check; continue to recommend supplementation for goal of 70-100 Defer vitamin D level  Medication management - Magnesium  Left foot drop Monitor, improved with increased exercise follow up ortho PRN  Obstructive sleep apnea Continue CPAP  Erectile dysfunction Previously on testosterone and cialis    Major depression in remission Continue zoloft, wellbutrin- discussed symptoms of serotonin syndrome.  Lifestyle discussed: diet/exerise, sleep hygiene, stress management, hydration  Screening for ischemic heart disease EKG  Screening for hematuria or  proteinuria Routine UA with reflex microscopic Microalbumin/creatinine UA  Screening PSA PSA    Discussed med's effects and SE's. Screening labs and tests as requested with regular follow-up as recommended. Over 40 minutes of exam, counseling, chart review and critical decision making was performed Future Appointments  Date Time Provider [place]  08/29/2021 11:30 AM Liane Comber, NP [facility] None  10/29/2021  9:00 AM Deneise Lever, MD [facility] None  04/10/2022 10:00 AM Magda Bernheim, NP [facility] None    _____________________________________________________________________________ HPI Patient presents for a CPE and for 3 month follow up on htn, hyperlipidemia, prediabetes, hypothyroidism, obesity, vit D def.   He has sleep apnea on CPAP.   Pain in back of left leg x 4-6 months after lifting wheelbarrow into truck.  Just rested the area as much as possible and took Tylenol.  Has now relieved.    He has hx of miositis and L foot drop and follows with ortho PRN, declined surgery. When turning quickly he will notice his balance is impaired.  Had one episode 6 months ago where he went to move and left foot stuck and he fell and broke 4 ribs. He continues to wear foot brace for footdrop   He has dx of depression in remission on zoloft 200 mg daily, and more recently added wellbutrin 150 mg. No symptoms of serotonin other than sweating which is unchanged for the patient.   BMI is Body mass index is 34.93 kg/m., he has been working on diet, Has not been going to the [facility] since Covid  Has been trying to make better choices.  Wt Readings from Last 3 Encounters:  04/10/21 223 lb (101.2 kg)  12/10/20 218 lb (98.9 kg)  10/27/20 214 lb 6.4 oz (97.3 kg)   He has not been checking BP at home, he is on metoprolol 25mg   BID,  and olmesartan 40 mg,  today their BP is BP: 136/70  He has a history of LVH with normal EF, last echo 2011, and normal cardiolite 2014. He does not  workout.  He denies chest pain, shortness of breath, dizziness.   He is on cholesterol medication and denies myalgias, had intolerant to statins due to elevated CPK so on zetia and lopid.  His cholesterol is not at goal. States has cut back significantly on cheese. The cholesterol last visit was:   Lab Results  Component Value Date   CHOL 152 12/07/2020   HDL 33 (L) 12/07/2020   LDLCALC 81 12/07/2020   TRIG 319 (H) 12/07/2020   CHOLHDL 4.6 12/07/2020   He has been working on diet and exercise for prediabetes,  and denies paresthesia of the feet, polydipsia, polyuria and visual disturbances. Last A1C in the office was:  Lab Results  Component Value Date   HGBA1C 5.7 (H) 12/07/2020   Patient is on Vitamin D supplement.   Lab Results  Component Value Date   VD25OH 59 12/07/2020    He is on thyroid medication. His medication was not changed last visit.   Lab Results  Component Value Date   TSH 1.03 12/07/2020    He was following with Dr. Suann Larry years ago for elevated PSA while on testosterone.  Lab Results  Component Value Date   PSA 0.4 01/26/2020    Current Medications:   Current Outpatient Medications (Endocrine & Metabolic):    levothyroxine (SYNTHROID) 50 MCG tablet, Take  1 tablet  Daily  on an empty stomach with only water for 30 minutes & no Antacid meds, Calcium or Magnesium for 4 hours & avoid Biotin  Current Outpatient Medications (Cardiovascular):    EPINEPHrine (EPIPEN 2-PAK) 0.3 mg/0.3 mL IJ SOAJ injection, Inject 0.3 mLs (0.3 mg total) into the muscle once.   ezetimibe (ZETIA) 10 MG tablet, Take  1 tablet  Daily  for Cholesterol                      /     TAKE ONE TABLET BY MOUTH DAILY FOR CHOLESTEROL   gemfibrozil (LOPID) 600 MG tablet, TAKE 1 TABLET TWICE A DAY FOR CHOLESTEROL   metoprolol tartrate (LOPRESSOR) 25 MG tablet, Take  1 tablet  2 x /day (every 12 hours) for BP   olmesartan (BENICAR) 40 MG tablet, Take  1 tablet  Daily  for BP              /       TAKE ONE TABLET BY MOUTH DAILY FOR BLOOD PRESSURE   hydrochlorothiazide (HYDRODIURIL) 12.5 MG tablet, Take  1 tablet  Daily  for BP and Fluid Retention / Ankle swelling (Patient not taking: Reported on 04/10/2021)   Current Outpatient Medications (Analgesics):    aspirin EC 81 MG tablet, Take 81 mg by mouth daily. Swallow whole.   HYDROcodone-acetaminophen (NORCO/VICODIN) 5-325 MG tablet, Take 1-2 tablets by mouth every 4 (four) hours as needed for moderate pain (Hold & Call MD if SBP<90, HR<65, RR<10, O2<90, or altered mental status.). (Patient not taking: Reported on 04/10/2021)   Current Outpatient Medications (Other):    Ascorbic Acid (VITAMIN C) 1000 MG tablet, Take 1,000 mg by mouth daily.   B Complex Vitamins (B COMPLEX PO), Take 1 tablet by mouth See admin instructions. Takes M,W,F   buPROPion (WELLBUTRIN XL) 150 MG 24 hr tablet, TAKE 1 TABLET EVERY MORNING FOR MOOD, FOCUS &  CONCENTRATION   Flaxseed, Linseed, (FLAX SEED OIL) 1000 MG CAPS, Take 2,000 mg by mouth daily.   MAGNESIUM PO, Take 750 mg by mouth daily.    methocarbamol (ROBAXIN) 750 MG tablet, Take 1 tablet (750 mg total) by mouth every 6 (six) hours as needed for muscle spasms (rib pain).   Multiple Vitamin (MULTIVITAMIN) capsule, Take by mouth.   sertraline (ZOLOFT) 100 MG tablet, Take one tablet (100mg ) daily for mood.  Health Maintenance:  Immunization History  Administered Date(s) Administered   Influenza Split 03/06/2012, 02/24/2013, 03/18/2014   Influenza, High Dose Seasonal PF 02/12/2016, 04/09/2017, 02/15/2019, 03/21/2020, 02/13/2021   Influenza-Unspecified 03/03/2015, 02/04/2018   PFIZER Comirnaty(Gray Top)Covid-19 Tri-Sucrose Vaccine 10/24/2020   PFIZER(Purple Top)SARS-COV-2 Vaccination 07/08/2019, 08/12/2019, 03/14/2020   PPD Test 07/26/2013   Pfizer Covid-19 Vaccine Bivalent Booster 61yrs & up 02/27/2021   Pneumococcal Conjugate-13 02/12/2016   Pneumococcal Polysaccharide-23 07/26/2013, 01/26/2019    Pneumococcal-Unspecified 05/04/2004   Td 04/07/2019   Tdap 07/24/2011   Zoster Recombinat (Shingrix) 01/28/2019, 04/20/2019   Zoster, Live 12/02/2013   Health Maintenance  Topic Date Due   COLONOSCOPY (Pts 45-65yrs Insurance coverage will need to be confirmed)  03/14/2022   TETANUS/TDAP  04/06/2029   Pneumonia Vaccine 51+ Years old  Completed   INFLUENZA VACCINE  Completed   COVID-19 Vaccine  Completed   Hepatitis C Screening  Completed   Zoster Vaccines- Shingrix  Completed   HPV VACCINES  Aged Out    Influenza 01/2019 TDAP 2013 Pneumovax 2015 Prevnar: 2017 Shingles vaccine: 2015, 2020 Covid 19 completed- had COVID in Dec  Colonoscopy 03/14/2017 - 5 year follow up   ECHO 08/06/09 Neg cardiolite 2014 CXR 2017 U/S ABD 08/01/11- FATTY LIVER   EYE Exam: Dr. Quay Burow, [facility] 2022 Dental exam: Dr. Conley Canal, last 2021  Patient Care Team: Unk Pinto, MD as PCP - General (Internal Medicine) Irene Shipper, MD as Consulting Physician (Gastroenterology) Dyke Maes, OD (Optometry)  Medical History:  Past Medical History:  Diagnosis Date   Cardiomegaly    Colon polyp    Depression    Fatty liver disease, nonalcoholic    Gallstones    Hypertension    Hypogonadism male    Mixed hyperlipidemia    Obesity    Pancreas (digestive gland) works poorly    Prediabetes    Sleep apnea    Testosterone deficiency 08/03/2009   Thyroid disease    Vitamin D deficiency    Allergies Allergies  Allergen Reactions   Ace Inhibitors Other (See Comments)    cough   Bee Venom Other (See Comments)    SURGICAL HISTORY He  has a past surgical history that includes Cholecystectomy (1976); Appendectomy (1964); Ankle fracture surgery (Left, 1986); Carpal tunnel release (Bilateral, 1996); Tibia IM nail insertion (Right, 03/21/2016); Colonoscopy; and Polypectomy. FAMILY HISTORY His family history includes Diabetes in his father, mother, and sister; Heart disease in his father; Hypertension in  his father. SOCIAL HISTORY He  reports that he quit smoking about 46 years ago. His smoking use included cigarettes. He has a 14.00 pack-year smoking history. He has never used smokeless tobacco. He reports that he does not drink alcohol and does not use drugs.    Review of Systems:  Review of Systems  Constitutional: Negative.  Negative for chills, fever, malaise/fatigue and weight loss.  HENT: Negative.  Negative for congestion, hearing loss, sinus pain, sore throat and tinnitus.   Eyes: Negative.  Negative for blurred vision and double vision.  Respiratory: Negative.  Negative for  cough, hemoptysis, sputum production, shortness of breath and wheezing.   Cardiovascular: Negative.  Negative for chest pain, palpitations, orthopnea, claudication, leg swelling and PND.  Gastrointestinal: Negative.  Negative for abdominal pain, blood in stool, constipation, diarrhea, heartburn, melena, nausea and vomiting.  Genitourinary: Negative.  Negative for dysuria and urgency.  Musculoskeletal:  Positive for falls. Negative for back pain, joint pain, myalgias and neck pain.  Skin: Negative.  Negative for rash.  Neurological: Negative.  Negative for dizziness, tingling, tremors, sensory change, weakness and headaches.       Left foot drop  Endo/Heme/Allergies: Negative.  Negative for polydipsia. Does not bruise/bleed easily.  Psychiatric/Behavioral: Negative.  Negative for depression, memory loss, substance abuse and suicidal ideas. The patient is not nervous/anxious and does not have insomnia.   All other systems reviewed and are negative.  Physical Exam: Estimated body mass index is 34.93 kg/m as calculated from the following:   Height as of this encounter: 5\' 7"  (1.702 m).   Weight as of this encounter: 223 lb (101.2 kg). BP 136/70   Pulse 77   Temp 97.6 F (36.4 C)   Ht 5\' 7"  (1.702 m)   Wt 223 lb (101.2 kg)   SpO2 96%   BMI 34.93 kg/m  General Appearance: Well nourished, in no apparent  distress.  Eyes: PERRLA, EOMs, conjunctiva no swelling or erythema, normal fundi and vessels.  Sinuses: No Frontal/maxillary tenderness  ENT/Mouth: Ext aud canals impacted bilaterally with cerumen, normal light reflex with TMs without erythema, bulging. Good dentition. No erythema, swelling, or exudate on post pharynx. Tonsils not swollen or erythematous. Hearing normal.  Neck: Supple, thyroid normal. No bruits  Respiratory: Respiratory effort normal, BS equal bilaterally without rales, rhonchi, wheezing or stridor.  Cardio: RRR without murmurs, rubs or gallops. Brisk peripheral pulses without edema.  Chest: symmetric, with normal excursions and percussion.  Abdomen: Soft, obese, nontender, with large vertical and right horizontal scar no guarding, rebound, hernias, masses, or organomegaly.  Lymphatics: Non tender without lymphadenopathy.  Genitourinary: defer Musculoskeletal: Full ROM all peripheral extremities,5/5 strength, and normal gait.  Skin:   Warm, dry without rashes, lesions, ecchymosis. Neuro: Cranial nerves intact, reflexes decreased bilateral legs, worse left than right with decrease dorsiflexion/plantar on left. Normal muscle tone, no cerebellar symptoms. Sensation intact.  Psych: Awake and oriented X 3, normal affect, Insight and Judgment appropriate.  EKG: NSR, no ST changes   Briana Farner W [facility] Tissue 10:59 AM [facility]

## 2021-04-10 ENCOUNTER — Other Ambulatory Visit: Payer: Self-pay

## 2021-04-10 ENCOUNTER — Encounter: Payer: Self-pay | Admitting: Nurse Practitioner

## 2021-04-10 ENCOUNTER — Ambulatory Visit (INDEPENDENT_AMBULATORY_CARE_PROVIDER_SITE_OTHER): Payer: PPO | Admitting: Nurse Practitioner

## 2021-04-10 VITALS — BP 136/70 | HR 77 | Temp 97.6°F | Ht 67.0 in | Wt 223.0 lb

## 2021-04-10 DIAGNOSIS — Z79899 Other long term (current) drug therapy: Secondary | ICD-10-CM | POA: Diagnosis not present

## 2021-04-10 DIAGNOSIS — E559 Vitamin D deficiency, unspecified: Secondary | ICD-10-CM

## 2021-04-10 DIAGNOSIS — Z136 Encounter for screening for cardiovascular disorders: Secondary | ICD-10-CM

## 2021-04-10 DIAGNOSIS — R7309 Other abnormal glucose: Secondary | ICD-10-CM

## 2021-04-10 DIAGNOSIS — E039 Hypothyroidism, unspecified: Secondary | ICD-10-CM

## 2021-04-10 DIAGNOSIS — I10 Essential (primary) hypertension: Secondary | ICD-10-CM | POA: Diagnosis not present

## 2021-04-10 DIAGNOSIS — E782 Mixed hyperlipidemia: Secondary | ICD-10-CM | POA: Diagnosis not present

## 2021-04-10 DIAGNOSIS — Z125 Encounter for screening for malignant neoplasm of prostate: Secondary | ICD-10-CM | POA: Diagnosis not present

## 2021-04-10 DIAGNOSIS — G4733 Obstructive sleep apnea (adult) (pediatric): Secondary | ICD-10-CM

## 2021-04-10 DIAGNOSIS — Z0001 Encounter for general adult medical examination with abnormal findings: Secondary | ICD-10-CM

## 2021-04-10 DIAGNOSIS — M21372 Foot drop, left foot: Secondary | ICD-10-CM

## 2021-04-10 DIAGNOSIS — Z1389 Encounter for screening for other disorder: Secondary | ICD-10-CM | POA: Diagnosis not present

## 2021-04-10 DIAGNOSIS — T466X5A Adverse effect of antihyperlipidemic and antiarteriosclerotic drugs, initial encounter: Secondary | ICD-10-CM

## 2021-04-10 DIAGNOSIS — M609 Myositis, unspecified: Secondary | ICD-10-CM

## 2021-04-10 DIAGNOSIS — Z0000 Encounter for general adult medical examination without abnormal findings: Secondary | ICD-10-CM

## 2021-04-10 DIAGNOSIS — N529 Male erectile dysfunction, unspecified: Secondary | ICD-10-CM

## 2021-04-10 DIAGNOSIS — F325 Major depressive disorder, single episode, in full remission: Secondary | ICD-10-CM

## 2021-04-10 DIAGNOSIS — G720 Drug-induced myopathy: Secondary | ICD-10-CM

## 2021-04-10 NOTE — Patient Instructions (Signed)

## 2021-04-11 LAB — URINALYSIS, ROUTINE W REFLEX MICROSCOPIC
Bilirubin Urine: NEGATIVE
Glucose, UA: NEGATIVE
Hgb urine dipstick: NEGATIVE
Ketones, ur: NEGATIVE
Leukocytes,Ua: NEGATIVE
Nitrite: NEGATIVE
Protein, ur: NEGATIVE
Specific Gravity, Urine: 1.02 (ref 1.001–1.035)
pH: 7 (ref 5.0–8.0)

## 2021-04-11 LAB — CBC WITH DIFFERENTIAL/PLATELET
Absolute Monocytes: 620 cells/uL (ref 200–950)
Basophils Absolute: 48 cells/uL (ref 0–200)
Basophils Relative: 1.2 %
Eosinophils Absolute: 180 cells/uL (ref 15–500)
Eosinophils Relative: 4.5 %
HCT: 45.8 % (ref 38.5–50.0)
Hemoglobin: 15.7 g/dL (ref 13.2–17.1)
Lymphs Abs: 1096 cells/uL (ref 850–3900)
MCH: 31 pg (ref 27.0–33.0)
MCHC: 34.3 g/dL (ref 32.0–36.0)
MCV: 90.3 fL (ref 80.0–100.0)
MPV: 9.9 fL (ref 7.5–12.5)
Monocytes Relative: 15.5 %
Neutro Abs: 2056 cells/uL (ref 1500–7800)
Neutrophils Relative %: 51.4 %
Platelets: 286 10*3/uL (ref 140–400)
RBC: 5.07 10*6/uL (ref 4.20–5.80)
RDW: 13.3 % (ref 11.0–15.0)
Total Lymphocyte: 27.4 %
WBC: 4 10*3/uL (ref 3.8–10.8)

## 2021-04-11 LAB — COMPLETE METABOLIC PANEL WITH GFR
AG Ratio: 1.7 (calc) (ref 1.0–2.5)
ALT: 35 U/L (ref 9–46)
AST: 38 U/L — ABNORMAL HIGH (ref 10–35)
Albumin: 4.7 g/dL (ref 3.6–5.1)
Alkaline phosphatase (APISO): 69 U/L (ref 35–144)
BUN: 21 mg/dL (ref 7–25)
CO2: 28 mmol/L (ref 20–32)
Calcium: 10.6 mg/dL — ABNORMAL HIGH (ref 8.6–10.3)
Chloride: 104 mmol/L (ref 98–110)
Creat: 0.87 mg/dL (ref 0.70–1.28)
Globulin: 2.8 g/dL (calc) (ref 1.9–3.7)
Glucose, Bld: 99 mg/dL (ref 65–99)
Potassium: 4.4 mmol/L (ref 3.5–5.3)
Sodium: 141 mmol/L (ref 135–146)
Total Bilirubin: 0.4 mg/dL (ref 0.2–1.2)
Total Protein: 7.5 g/dL (ref 6.1–8.1)
eGFR: 93 mL/min/{1.73_m2} (ref >=60)

## 2021-04-11 LAB — LIPID PANEL
Cholesterol: 167 mg/dL (ref <200)
HDL: 35 mg/dL — ABNORMAL LOW (ref >=40)
LDL Cholesterol (Calc): 86 mg/dL (calc)
Non-HDL Cholesterol (Calc): 132 mg/dL (calc) — ABNORMAL HIGH (ref <130)
Total CHOL/HDL Ratio: 4.8 (calc) (ref <5.0)
Triglycerides: 346 mg/dL — ABNORMAL HIGH (ref <150)

## 2021-04-11 LAB — TSH: TSH: 1.12 mIU/L (ref 0.40–4.50)

## 2021-04-11 LAB — MAGNESIUM: Magnesium: 2.1 mg/dL (ref 1.5–2.5)

## 2021-04-11 LAB — MICROALBUMIN / CREATININE URINE RATIO
Creatinine, Urine: 99 mg/dL (ref 20–320)
Microalb Creat Ratio: 10 mcg/mg creat (ref <30)
Microalb, Ur: 1 mg/dL

## 2021-04-11 LAB — PSA: PSA: 0.64 ng/mL (ref <=4.00)

## 2021-04-11 LAB — VITAMIN D 25 HYDROXY (VIT D DEFICIENCY, FRACTURES): Vit D, 25-Hydroxy: 79 ng/mL (ref 30–100)

## 2021-04-30 ENCOUNTER — Telehealth: Payer: Self-pay | Admitting: Internal Medicine

## 2021-04-30 NOTE — Chronic Care Management (AMB) (Signed)
  Chronic Care Management   Outreach Note  04/30/2021 Name: [name] [name] MRN: [id number] DOB: [birth year]  Referred by: Unk Pinto, MD Reason for referral : No chief complaint on file.   An unsuccessful telephone outreach was attempted today. The patient was referred to the pharmacist for assistance with care management and care coordination.   Follow Up Plan:   Tatjana Dellinger Upstream Scheduler

## 2021-05-02 ENCOUNTER — Telehealth: Payer: Self-pay | Admitting: Internal Medicine

## 2021-05-02 NOTE — Chronic Care Management (AMB) (Signed)
  Chronic Care Management   Note  05/02/2021 Name: [name] [name] MRN: [id number] DOB: [birth year]  [name] [name] is a 69 y.o. year old male who is a primary care patient of Unk Pinto, MD. I reached out to [name] [name] by phone today in response to a referral sent by Mr. Phong Isenberg Crew's PCP, Unk Pinto, MD.   [name] was given information about Chronic Care Management services today including:  CCM service includes personalized support from designated clinical staff supervised by his physician, including individualized plan of care and coordination with other care providers 24/7 contact phone numbers for assistance for urgent and routine care needs. Service will only be billed when office clinical staff spend 20 minutes or more in a month to coordinate care. Only one practitioner may furnish and bill the service in a calendar month. The patient may stop CCM services at any time (effective at the end of the month) by phone call to the office staff.   Patient agreed to services and verbal consent obtained.   Follow up plan:PT MAY CHANGE TO [place] HAVE A COPAY   [place]

## 2021-06-01 ENCOUNTER — Other Ambulatory Visit: Payer: Self-pay

## 2021-06-01 ENCOUNTER — Other Ambulatory Visit: Payer: Self-pay | Admitting: Internal Medicine

## 2021-06-01 DIAGNOSIS — E039 Hypothyroidism, unspecified: Secondary | ICD-10-CM

## 2021-06-01 MED ORDER — LEVOTHYROXINE SODIUM 50 MCG PO TABS: ORAL_TABLET | ORAL | 3 refills | Status: DC

## 2021-06-04 ENCOUNTER — Other Ambulatory Visit: Payer: Self-pay

## 2021-06-04 DIAGNOSIS — I10 Essential (primary) hypertension: Secondary | ICD-10-CM

## 2021-06-04 MED ORDER — OLMESARTAN MEDOXOMIL 40 MG PO TABS: ORAL_TABLET | ORAL | 3 refills | Status: DC

## 2021-06-11 ENCOUNTER — Other Ambulatory Visit: Payer: Self-pay

## 2021-06-11 DIAGNOSIS — E782 Mixed hyperlipidemia: Secondary | ICD-10-CM

## 2021-06-11 MED ORDER — BUPROPION HCL ER (XL) 150 MG PO TB24: ORAL_TABLET | ORAL | 3 refills | Status: DC

## 2021-06-11 MED ORDER — EZETIMIBE 10 MG PO TABS: ORAL_TABLET | ORAL | 3 refills | Status: DC

## 2021-06-28 ENCOUNTER — Telehealth: Payer: Self-pay

## 2021-06-28 NOTE — Telephone Encounter (Signed)
Called patient to confirm upcoming visit w/ CPP. [place].  Total time spent: 3 minutes [name], [facility]

## 2021-07-02 ENCOUNTER — Telehealth: Payer: Self-pay

## 2021-07-02 NOTE — Telephone Encounter (Signed)
Called patient to confirm upcoming visit w/ CPP.[place].  Total time spent: 2 minutes [name], [facility]

## 2021-07-02 NOTE — Telephone Encounter (Signed)
Patient returned call to and went over pre call questions and confirmed appointment.  Total time spent: 12 Minutes [name] [name] [name]. HC

## 2021-07-03 ENCOUNTER — Other Ambulatory Visit: Payer: Self-pay

## 2021-07-03 ENCOUNTER — Ambulatory Visit: Payer: Medicare HMO | Admitting: Pharmacist

## 2021-07-03 VITALS — BP 146/95 | HR 72 | Wt 227.4 lb

## 2021-07-03 DIAGNOSIS — E782 Mixed hyperlipidemia: Secondary | ICD-10-CM

## 2021-07-03 DIAGNOSIS — I10 Essential (primary) hypertension: Secondary | ICD-10-CM

## 2021-07-03 DIAGNOSIS — G4733 Obstructive sleep apnea (adult) (pediatric): Secondary | ICD-10-CM

## 2021-07-03 DIAGNOSIS — Z79899 Other long term (current) drug therapy: Secondary | ICD-10-CM

## 2021-07-03 DIAGNOSIS — E039 Hypothyroidism, unspecified: Secondary | ICD-10-CM

## 2021-07-03 DIAGNOSIS — E559 Vitamin D deficiency, unspecified: Secondary | ICD-10-CM

## 2021-07-03 DIAGNOSIS — R7309 Other abnormal glucose: Secondary | ICD-10-CM

## 2021-07-11 NOTE — Progress Notes (Signed)
Pharmacist Visit  Shayden, Bobier [id number] 28 years, Male  DOB: 02/24/1951  M: [phone] Care Team: Otto Herb  __________________________________________________ Clinical Summary Patient Risk: Low Next CCM Follow Up: 02/12/2022 @ 9am Next AWV: 08/29/21 @ 11:30 am w/ Caryl Pina Next PCP Visit: 08/29/21 @ 11:30 am w/ Caryl Pina Summary for PCP: Patient is doing well during visit. Main goals for patient are Triglyceride control and Weight management. Discussed non-pharm recommendations to decrease triglycerides, discussed increasing fiber intake and water intake, discussed participation in silver sneakers program. Recommended the patient space out Levothyroxine and Magnesium (Pt was taking Magnesium 500mg  in the morning (within 4 hours of levothyroxine) and 250mg  in the evening. Pt will now take 750mg  together at bedtime. Attestation Statement:: CCM Services:  This encounter meets complex CCM services and moderate to high medical decision making.  Prior to outreach and patient consent for Chronic Care Management, I referred this patient for services after reviewing the nominated patient list or from a personal encounter with the patient.  I have personally reviewed this encounter including the documentation in this note and have collaborated with the care management provider regarding care management and care coordination activities to include development and update of the comprehensive care plan I am certifying that I agree with the content of this note and encounter as supervising physician.  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 Previous work?: Retired now, patient was  an Automotive engineer What does the patient do during the day?: PT will do yard work if the weather permits. Pt is not very active but wife motivates him to walk with her to grocery stores and other places Who does the patient spend their time with and what do they do?: Wife, Silva Bandy Lifestyle habits such as diet and exercise?: Diet: Breakfast: [place] Bacon with Eggs, Lunch: sandwich, soup, spaghettis, salads on occasion. Dinner: Bagel Caffeine: 1 cup a day Water: 16 ounces a day Alcohol, tobacco, and illicit drug usage?: None What is the patient's sleep pattern?: Trouble staying asleep, Other (with free form text) Details: Wakes up multiple times a night to urinate, uses CPAP machine (12). Follows with Dr. Anne Shutter Patient pleased with health care they are receiving?: Yes Name and location of Current pharmacy: [place] Current Rx insurance plan: [employer] Are meds synced by current pharmacy?: No Are meds delivered by current pharmacy?: Yes - by mail order pharmacy Would patient benefit from direct intervention of clinical lead in dispensing process to optimize clinical outcomes?: Yes Are [facility] pharmacy services available where patient lives?: Yes Is patient disadvantaged to use [facility]?: Yes Does patient experience delays in picking up medications due to transportation concerns (getting to pharmacy)?: No  Hypertension (HTN) Assess this condition today?: Yes Is patient able to obtain BP reading today?: Yes BP today is: 146/95 Heart Rate is: 72 Goal: <130/80 mmHG Hypertension Stage: Stage 2 (SBP >140 or DBP > 90) Is Patient checking BP at home?: Yes Pt. home BP readings are ranging: 112/78, 130/80, 130/84 How often does patient miss taking their blood pressure medications?: Never Has patient experienced hypotension, dizziness, falls or bradycardia?: No Check present secondary causes (below) for HTN: Obesity, Sleep Apnea We discussed: DASH diet:  following a diet emphasizing fruits and vegetables and low-fat dairy products along with whole grains, fish, poultry, and nuts. Reducing red meats and sugars., Targeting 150 minutes of aerobic activity per week, Reducing the amount of salt intake to 1500mg /per day., Other (provide details below) Details: Discussed involvement in silver sneakers virtual exercise videos to increase activity Assessment:: Controlled Drug: Metoprolol 25mg  BID Assessment: Appropriate, Effective, Safe, Accessible Drug: Olmesartan 40mg  QD Assessment: Appropriate, Effective, Safe, Accessible Additional Info: Pt BP elevated today in office, pt was also very hot in the office and sweaty. Pt usually donate plasma once a weeks and has blood pressure checked there. His blood pressure is normally controlled at the donation clinic. Pharmacist Follow up: Assess BP in office and at home, monitor HR, K+, SCr, BMP  Hyperlipidemia/Dyslipidemia (HLD) Last Lipid panel on: 04/10/2021 TC (Goal<200): 167 LDL: 86 HDL (Goal>40): 35 TG (Goal<150): 346 ASCVD 10-year risk?is:: High (>20%) ASCVD Risk Score: 36.8% Assess this condition today?: Yes LDL Goal: <70 Has patient tried and failed any HLD Medications?: Yes Medications failed: atorvastatin atorvastatin: Myositis Check present secondary causes (below) that can lead to increased cholesterol levels (multi-choice optional): Beta blockers We discussed: Encouraged increasing fiber to a daily intake of 10-25g/day, How a diet high in plant sterols (fruits/vegetables/nuts/whole grains/legumes) may reduce your cholesterol., How to reduce cholesterol through diet/weight management and physical activity. Assessment:: Uncontrolled Drug: Gemfibrozil 600mg  BID Assessment: Appropriate, Query Effectiveness Drug: Ezetimibe 10mg  QD Assessment: Appropriate, Query Effectiveness Pharmacist Follow up: Lipid panel, LFTs, s/s cramping, abdominal pain  Diabetes (DM) Current A1C: 5.7 taken on:  12/06/2020 Previous A1C: 6.0 taken on: 08/28/2020 Type: Pre-Diabetes/Impaired Fasting Glucose Assess this condition today?: Yes Goal A1C: Other Details: <5.7% Type: Pre-Diabetes/Impaired Fasting Glucose We discussed: Diet and exercise extensively, Modifying lifestyle, including to participate in moderate physical activity (e.g., walking) at least 150 minutes per week., Low carbohydrate eating plan with an emphasis on whole grains, legumes, nuts, fruits, and vegetables and minimal refined and processed foods. Assessment:: Controlled Drug: None Pharmacist Follow up: Assess A1c and FBG  Hypothyroidism Current TSH: 1.12 taken on: 04/10/2021 Previous TSH: 1.03 taken on: 12/06/2020 Current T4: Unknown Current T3: Unknown Assess this condition today?: Yes We discussed: Proper administration of levothyroxine (empty stomach, 30 min before breakfast and with no other meds / vitamins), Other Details: Discussed that patient should take both magnesium 500mg  tablets and 250mg  tablets in the evening to adequately separate the magnesium from the levothyroxine Assessment:: Controlled Drug: Levothyroxine 69mcg QD Pharmacist Follow up: Asses TSH level  Vitamin D Deficiency Pertinent Labs: Vit D 25OH (04/11/2021): 79 Assessment:: Controlled Drug: Vitamin D 2000 IU take 3 tablets at bedtime Assessment: Appropriate, Effective, Safe, Accessible  Obesity Assess this condition today?: Yes What condition are we assessing today?: Obesity Pertinent Labs: Weight: 227.4 lbs BMI: 35.6 Assessment:: Uncontrolled Drug: None Plan to Start: Encouraged patient to reenroll in silver sneakers program, complete online exercise videos, increase water intake to 64 ounces per day.  Pharmacist Follow up: Will follow-up with weight loss at next visit  Exercise, Diet and Non-Drug Coordination Needs Additional exercise counseling points. We discussed: aiming to lose 5 to 10% of body weight through lifestyle  modifications, decreasing sedentary behavior, encouraging participation in insurance-covered exercise program through local gym (i.e. Silver Sneakers) Additional diet counseling points. We discussed: aiming to consume at least 8 cups of water day, key components of the DASH diet Discussed Non-Drug Care Coordination Needs: Yes Does Patient have Medication financial barriers?: No  Accountable Health Communities Health-Related  Social Needs Screening Tool -  SDOH  ([email or web address]) What is your living situation today? (ref #1): I have a steady place to live Think about the place you live. Do you have problems with any of the following? (ref #2): None of the above Within the past 12 months, you worried that your food would run out before you got money to buy more (ref #3): Never true Within the past 12 months, the food you bought just didn't last and you didn't have money to get more (ref #4): Never true In the past 12 months, has lack of reliable transportation kept you from medical appointments, meetings, work or from getting things needed for daily living? (ref #5): No In the past 12 months, has the electric, gas, oil, or water company threatened to shut off services in your home? (ref #6): No How often does anyone, including family and friends, physically hurt you? (ref #7): Never (1) How often does anyone, including family and friends, insult or talk down to you? (ref #8): Never (1) How often does anyone, including friends and family, threaten you with harm? (ref #9): Never (1) How often does anyone, including family and friends, scream or curse at you? (ref #10): Never (1)  Engagement Notes Newton Pigg on 07/04/2021 11:17 AM CPP Chart Review: 30 min CPP Office Visit: 79 min CPP Office Visit Documentation: 45 min CPP Coordination of Care: [facility] Care Plan Completion: 22 Minutes CPP Care Plan Review: 15  min          Rachelle Hora.  Jeannett Senior, PharmD  Clinical Pharmacist  [email or web address]  [phone]

## 2021-07-23 ENCOUNTER — Other Ambulatory Visit: Payer: Self-pay | Admitting: Nurse Practitioner

## 2021-07-23 ENCOUNTER — Telehealth: Payer: Self-pay | Admitting: Nurse Practitioner

## 2021-07-23 DIAGNOSIS — I10 Essential (primary) hypertension: Secondary | ICD-10-CM

## 2021-07-23 MED ORDER — METOPROLOL TARTRATE 25 MG PO TABS: ORAL_TABLET | ORAL | 3 refills | Status: DC

## 2021-07-23 NOTE — Telephone Encounter (Signed)
Patients wife called and states that he needs a refill on Metoprolol to [facility].

## 2021-07-24 DIAGNOSIS — E559 Vitamin D deficiency, unspecified: Secondary | ICD-10-CM | POA: Diagnosis not present

## 2021-07-24 DIAGNOSIS — E782 Mixed hyperlipidemia: Secondary | ICD-10-CM | POA: Diagnosis not present

## 2021-07-24 DIAGNOSIS — E039 Hypothyroidism, unspecified: Secondary | ICD-10-CM | POA: Diagnosis not present

## 2021-07-24 DIAGNOSIS — I10 Essential (primary) hypertension: Secondary | ICD-10-CM | POA: Diagnosis not present

## 2021-08-03 DIAGNOSIS — L570 Actinic keratosis: Secondary | ICD-10-CM | POA: Diagnosis not present

## 2021-08-03 DIAGNOSIS — L821 Other seborrheic keratosis: Secondary | ICD-10-CM | POA: Diagnosis not present

## 2021-08-03 DIAGNOSIS — D225 Melanocytic nevi of trunk: Secondary | ICD-10-CM | POA: Diagnosis not present

## 2021-08-03 DIAGNOSIS — L0212 Furuncle of neck: Secondary | ICD-10-CM | POA: Diagnosis not present

## 2021-08-07 ENCOUNTER — Other Ambulatory Visit: Payer: Self-pay | Admitting: Internal Medicine

## 2021-08-07 MED ORDER — GEMFIBROZIL 600 MG PO TABS: ORAL_TABLET | ORAL | 3 refills | Status: DC

## 2021-08-13 ENCOUNTER — Telehealth: Payer: Self-pay | Admitting: Nurse Practitioner

## 2021-08-13 ENCOUNTER — Other Ambulatory Visit: Payer: Self-pay | Admitting: Nurse Practitioner

## 2021-08-13 DIAGNOSIS — E039 Hypothyroidism, unspecified: Secondary | ICD-10-CM

## 2021-08-13 MED ORDER — LEVOTHYROXINE SODIUM 50 MCG PO TABS: ORAL_TABLET | ORAL | 3 refills | Status: DC

## 2021-08-13 NOTE — Telephone Encounter (Signed)
Requesting refill on Levothyroxine to be sent to [facility]  ?

## 2021-08-14 ENCOUNTER — Telehealth: Payer: Self-pay | Admitting: Nurse Practitioner

## 2021-08-14 ENCOUNTER — Other Ambulatory Visit: Payer: Self-pay | Admitting: Nurse Practitioner

## 2021-08-14 DIAGNOSIS — E039 Hypothyroidism, unspecified: Secondary | ICD-10-CM

## 2021-08-14 MED ORDER — LEVOTHYROXINE SODIUM 50 MCG PO TABS: ORAL_TABLET | ORAL | 3 refills | Status: DC

## 2021-08-14 NOTE — Telephone Encounter (Signed)
Sorry about that.  I got it sent

## 2021-08-14 NOTE — Telephone Encounter (Signed)
Levothyroxine was sent to the wrong pharmacy, please resend to [place]. Thank you! ?

## 2021-08-28 NOTE — Progress Notes (Signed)
ANNUAL WELLNESS VISIT AND FOLLOW UP ? ?Assessment and Plan: ? ?Annual Medicare Wellness Visit ?Due annually  ?Health maintenance reviewed ? ?Essential hypertension ?Persistent elevations today ?Start monitoring blood pressure at home daily and keep log; call if consistently over 130/80 ?Follow up in 4 weeks with log and home BP cuff for recheck  ?Continue DASH diet.   ?Reminder to go to the ER if any CP, SOB, nausea, dizziness, severe HA, changes vision/speech, left arm numbness and tingling and jaw pain. ?- CBC with Differential/Platelet ?- CMP/GFR ? ? Hypothyroidism, unspecified hypothyroidism type ?Hypothyroidism-check TSH level, continue medications the same, reminded to take on an empty stomach 30-60mns before food.  ?- TSH ? ?Prediabetes ?Discussed general issues about diabetes pathophysiology and management., Educational material distributed., Suggested low cholesterol diet., Encouraged aerobic exercise., Discussed foot care., Reminded to get yearly retinal exam. ?- Hemoglobin A1c ? ? Hyperlipidemia/ statin myopathy ?-continue medications for now, check lipids, decrease fatty foods, increase activity.  ?- he admits poor lifestyle, has plans to reduce saturated fat ?- Lipid panel ? ?Morbid Obesity (BMI 30+ with OSA and other co morbidities) ?- follow up 3 months for progress monitoring ?- increase veggies, decrease carbs ?- long discussion about weight loss, diet, and exercise ?- weight goal of <200 lb set ?- plan to cut out liquid calories (sugar sweetened tea) ? ?Prediabetes ?Discussed disease and risks ?Discussed diet/exercise, weight management  ?A1C ? ? Vitamin D deficiency ?At goal at recent check; continue to recommend supplementation for goal of 70-100 ?Defer vitamin D level ? ?Medication management ?- Magnesium ? ?Left foot drop ?Monitor, improved with increased exercise ?follow up ortho PRN ? ?Obstructive sleep apnea ?Continue CPAP ?Weight loss encouraged  ? ?Erectile dysfunction ?Previously on  testosterone and cialis, monitoring only  ? ? Major depression in remission ([facility] ?Continue zoloft, wellbutrin, doing well with reduced doses ?Lifestyle discussed: diet/exerise, sleep hygiene, stress management, hydration ? ?Persistent dry cough, intermittent ?Try OTC nexium daily HS x 2 weeks ?If no improvement try allergy pill x 2 weeks ?Voice rest, suppress cough with OTC sugar free candy and RX med.   ?Follow up 1 month.  ? ? ?Orders Placed This Encounter  ?Procedures  ? CBC with Differential/Platelet  ? COMPLETE METABOLIC PANEL WITH GFR  ? Lipid panel  ? TSH  ? Magnesium  ? Hemoglobin A1c  ? ? ?  ? ?Discussed med's effects and SE's. Screening labs and tests as requested with regular follow-up as recommended. ?Over 40 minutes of exam, counseling, chart review and critical decision making was performed ?Future Appointments  ?Date Time Provider [place] ?10/29/2021  9:00 AM YDeneise Lever MD [facility] None  ?02/12/2022  9:00 AM ANewton Pigg RPH [facility] None  ?04/10/2022 10:00 AM [place], DT[place]send Roger NP [facility] None  ?10/01/2022 10:00 AM CLiane Comber NP [facility] None  ? ? ?Plan:  ? ?During the course of the visit the patient was educated and counseled about appropriate screening and preventive services including:  ? ?Pneumococcal vaccine  ?Prevnar 13 ?Influenza vaccine ?Td vaccine ?Screening electrocardiogram ?Bone densitometry screening ?Colorectal cancer screening ?Diabetes screening ?Glaucoma screening ?Nutrition counseling  ?Advanced directives: requested ? ? ?_____________________________________________________________________________ ?HPI ?71y.o. male patient presents for a AWV and for 3 month follow up. He has Hyperlipidemia; Morbid obesity ([facility] - BMI 30+ with OSA; Essential hypertension; Obstructive sleep apnea; Vitamin D deficiency; Left foot drop; Myositis; Medication management; Hypothyroidism; Erectile dysfunction; Major depression in remission ([facility]; Abnormal glucose; Fatty liver  disease, nonalcoholic; Near syncope;  Multiple fractures of ribs, left side, initial encounter for closed fracture; and Recurrent dry cough on their problem list. ? ?He has sleep apnea on CPAP.  ? ?He has hx of miositis and L foot drop and follows with ortho PRN, declined surgery. When turning quickly he will notice his balance is impaired.  He has some aching of bil shoulders and knees, uses topical occasionally. Uses foot brace PRN for foot drop, but notes having less issues. Reports last fall 09/2020, had rib fractures, has since resolved. Has had several falls but he states all situational, rushes too much, declines PT at this time.  ? ?He has dx of depression in remission on zoloft 100 mg daily, and more recently added wellbutrin 150 mg.  ? ?BMI is Body mass index is 35.24 kg/m?., he has been working on diet, Has not been going to the [facility] since [place]. Has been trying to make better choices. Fatty liver per [place] 07/2011. He is doing sweet tea, doing 1/2 sugar 1/2 splenda but feels he could cut the sugar out.  ?Wt Readings from Last 3 Encounters:  ?08/29/21 225 lb (102.1 kg)  ?07/04/21 227 lb 6.4 oz (103.1 kg)  ?04/10/21 223 lb (101.2 kg)  ? ?He has a history of LVH with normal EF, last echo 2011, and normal cardiolite 2014. ? ?He has not been checking BP at home, he is on metoprolol '25mg'$  BID,  and olmesartan 40 mg,  today their BP is BP: (!) 158/82  ?He does get BP checked manually with frequent blood donations, log shows 120s/130s/80s but trending up, last in Feb 2023.  ? ?He does not workout.  He denies chest pain, shortness of breath, dizziness.  ? ?He is on cholesterol medication and denies myalgias, had intolerant to statins due to elevated CPK (with atorvastatin 80 mg daily) so on zetia and lopid.  His cholesterol is not at goal. States has cut back significantly on cheese. The cholesterol last visit was:   ?Lab Results  ?Component Value Date  ? CHOL 167 04/10/2021  ? HDL 35 (L) 04/10/2021  ? Pe Ell 86  04/10/2021  ? TRIG 346 (H) 04/10/2021  ? CHOLHDL 4.8 04/10/2021  ? ?He has been working on diet and exercise for prediabetes,  and denies paresthesia of the feet, polydipsia, polyuria and visual disturbances. Last A1C in the office was:  ?Lab Results  ?Component Value Date  ? HGBA1C 5.7 (H) 12/07/2020  ? ?Patient is on Vitamin D supplement.   ?Lab Results  ?Component Value Date  ? VD25OH 79 04/10/2021  ?  ?He is on thyroid medication. His medication was not changed last visit.   ?Lab Results  ?Component Value Date  ? TSH 1.12 04/10/2021  ?  ?He was following with Dr. Risa Grill years ago for elevated PSA while on testosterone.  ?Lab Results  ?Component Value Date  ? PSA 0.64 04/10/2021  ? ? ?Current Medications:  ? ?Current Outpatient Medications (Endocrine & Metabolic):  ?  levothyroxine (SYNTHROID) 50 MCG tablet, Take  1 tablet  Daily  on an empty stomach with only water for 30 minutes & no Antacid meds, Calcium or Magnesium for 4 hours & avoid Biotin ? ?Current Outpatient Medications (Cardiovascular):  ?  EPINEPHrine (EPIPEN 2-PAK) 0.3 mg/0.3 mL IJ SOAJ injection, Inject 0.3 mLs (0.3 mg total) into the muscle once. ?  ezetimibe (ZETIA) 10 MG tablet, Take  1 tablet  Daily  for Cholesterol                      /  TAKE ONE TABLET BY MOUTH DAILY FOR CHOLESTEROL ?  gemfibrozil (LOPID) 600 MG tablet, Take  1 tablet  2 x /day  for Cholesterol & Triglycerides ?  metoprolol tartrate (LOPRESSOR) 25 MG tablet, Take  1 tablet  2 x /day (every 12 hours) for BP ?  olmesartan (BENICAR) 40 MG tablet, Take  1 tablet  Daily  for BP              /      TAKE ONE TABLET BY MOUTH DAILY FOR BLOOD PRESSURE ? ? ?Current Outpatient Medications (Analgesics):  ?  aspirin EC 81 MG tablet, Take 81 mg by mouth daily. Swallow whole. ? ? ?Current Outpatient Medications (Other):  ?  Ascorbic Acid (VITAMIN C) 1000 MG tablet, Take 1,000 mg by mouth daily. ?  B Complex Vitamins (B COMPLEX PO), Take 1 tablet by mouth 3 (three) times a week. Takes  M,W,F in the morning ?  buPROPion (WELLBUTRIN XL) 150 MG 24 hr tablet, Take 1 tablet every morning for mood, focus, and concentration ?  Cholecalciferol (VITAMIN D) 50 MCG (2000 UT) tablet, Take 6,000 Units by mou

## 2021-08-29 ENCOUNTER — Encounter: Payer: Self-pay | Admitting: Adult Health

## 2021-08-29 ENCOUNTER — Ambulatory Visit (INDEPENDENT_AMBULATORY_CARE_PROVIDER_SITE_OTHER): Payer: Medicare HMO | Admitting: Adult Health

## 2021-08-29 VITALS — BP 158/82 | HR 58 | Temp 97.2°F | Wt 225.0 lb

## 2021-08-29 DIAGNOSIS — E782 Mixed hyperlipidemia: Secondary | ICD-10-CM | POA: Diagnosis not present

## 2021-08-29 DIAGNOSIS — R6889 Other general symptoms and signs: Secondary | ICD-10-CM | POA: Diagnosis not present

## 2021-08-29 DIAGNOSIS — E039 Hypothyroidism, unspecified: Secondary | ICD-10-CM

## 2021-08-29 DIAGNOSIS — Z79899 Other long term (current) drug therapy: Secondary | ICD-10-CM | POA: Diagnosis not present

## 2021-08-29 DIAGNOSIS — R058 Other specified cough: Secondary | ICD-10-CM

## 2021-08-29 DIAGNOSIS — K760 Fatty (change of) liver, not elsewhere classified: Secondary | ICD-10-CM

## 2021-08-29 DIAGNOSIS — R7309 Other abnormal glucose: Secondary | ICD-10-CM

## 2021-08-29 DIAGNOSIS — G4733 Obstructive sleep apnea (adult) (pediatric): Secondary | ICD-10-CM | POA: Diagnosis not present

## 2021-08-29 DIAGNOSIS — I10 Essential (primary) hypertension: Secondary | ICD-10-CM | POA: Diagnosis not present

## 2021-08-29 DIAGNOSIS — Z0000 Encounter for general adult medical examination without abnormal findings: Secondary | ICD-10-CM

## 2021-08-29 DIAGNOSIS — M21372 Foot drop, left foot: Secondary | ICD-10-CM

## 2021-08-29 DIAGNOSIS — R69 Illness, unspecified: Secondary | ICD-10-CM | POA: Diagnosis not present

## 2021-08-29 DIAGNOSIS — Z0001 Encounter for general adult medical examination with abnormal findings: Secondary | ICD-10-CM | POA: Diagnosis not present

## 2021-08-29 DIAGNOSIS — E559 Vitamin D deficiency, unspecified: Secondary | ICD-10-CM | POA: Diagnosis not present

## 2021-08-29 DIAGNOSIS — F325 Major depressive disorder, single episode, in full remission: Secondary | ICD-10-CM

## 2021-08-29 DIAGNOSIS — N529 Male erectile dysfunction, unspecified: Secondary | ICD-10-CM | POA: Diagnosis not present

## 2021-08-29 MED ORDER — SERTRALINE HCL 100 MG PO TABS: ORAL_TABLET | ORAL | 3 refills | Status: DC

## 2021-08-29 NOTE — Patient Instructions (Addendum)
?[name] [name] , ?Thank you for taking time to come for your Medicare Wellness Visit. I appreciate your ongoing commitment to your health goals. Please review the following plan we discussed and let me know if I can assist you in the future.  ? ?These are the goals we discussed: ? Goals   ? ?  Blood Pressure < 130/80   ?  DIET - REDUCE SUGAR INTAKE   ?  Exercise 150 min/wk Moderate Activity   ?  Weight (lb) < 200 lb (90.7 kg)   ? ?  ?  ?This is a list of the screening recommended for you and due dates:  ?Health Maintenance  ?Topic Date Due  ? Flu Shot  12/25/2021  ? Colon Cancer Screening  03/14/2022  ? Tetanus Vaccine  04/06/2029  ? Pneumonia Vaccine  Completed  ? COVID-19 Vaccine  Completed  ? Hepatitis C Screening: USPSTF Recommendation to screen - Ages 10-79 yo.  Completed  ? Zoster (Shingles) Vaccine  Completed  ? HPV Vaccine  Aged Out  ? ? ?Please pick up nexium and try taking 1 tab 1 hour prior to bedtime daily for 2 weeks and see if this helps the persistent cough.  ? ?If this doesn't help try taking an allergy pill such as allegra or zyrtec daily for 2 weeks.  ? ?Let me know if either help next visit ? ? ? ? ?HYPERTENSION INFORMATION ? ?Monitor your blood pressure at home, please keep a record and bring that in with you to your next office visit.  ? ?Go to the ER if any CP, SOB, nausea, dizziness, severe HA, changes vision/speech ? ?Testing/Procedures: ?HOW TO TAKE YOUR BLOOD PRESSURE: ?Rest 5 minutes before taking your blood pressure. ?Don?t smoke or drink caffeinated beverages for at least 30 minutes before. ?Take your blood pressure before (not after) you eat. ?Sit comfortably with your back supported and both feet on the floor (don?t cross your legs). ?Elevate your arm to heart level on a table or a desk. ?Use the proper sized cuff. It should fit smoothly and snugly around your bare upper arm. There should be enough room to slip a fingertip under the cuff. The bottom edge of the cuff should be 1 inch  above the crease of the elbow. ? ?Your most recent BP: BP: (!) 156/84  ? ?Take your medications faithfully as instructed. ?Maintain a healthy weight. ?Get at least 150 minutes of aerobic exercise per week. ?Minimize salt intake. ?Minimize alcohol intake ? ?DASH Eating Plan ?DASH stands for "Dietary Approaches to Stop Hypertension." The DASH eating plan is a healthy eating plan that has been shown to reduce high blood pressure (hypertension). Additional health benefits may include reducing the risk of type 2 diabetes mellitus, heart disease, and stroke. The DASH eating plan may also help with weight loss. ?WHAT DO I NEED TO KNOW ABOUT THE DASH EATING PLAN? ?For the DASH eating plan, you will follow these general guidelines: ?Choose foods with a percent daily value for sodium of less than 5% (as listed on the food label). ?Use salt-free seasonings or herbs instead of table salt or sea salt. ?Check with your health care provider or pharmacist before using salt substitutes. ?Eat lower-sodium products, often labeled as "lower sodium" or "no salt added." ?Eat fresh foods. ?Eat more vegetables, fruits, and low-fat dairy products. ?Choose whole grains. Look for the word "whole" as the first word in the ingredient list. ?Choose fish and skinless chicken or [place] more often than red  meat. Limit fish, poultry, and meat to 6 oz (170 g) each day. ?Limit sweets, desserts, sugars, and sugary drinks. ?Choose heart-healthy fats. ?Limit cheese to 1 oz (28 g) per day. ?Eat more home-cooked food and less restaurant, buffet, and fast food. ?Limit fried foods. ?Cook foods using methods other than frying. ?Limit canned vegetables. If you do use them, rinse them well to decrease the sodium. ?When eating at a restaurant, ask that your food be prepared with less salt, or no salt if possible. ?WHAT FOODS CAN I EAT? ?Seek help from a dietitian for individual calorie needs. ?Grains ?Whole grain or whole wheat bread. Brown rice. Whole grain or  whole wheat pasta. Quinoa, bulgur, and whole grain cereals. Low-sodium cereals. Corn or whole wheat flour tortillas. Whole grain cornbread. Whole grain crackers. Low-sodium crackers. ?Vegetables ?Fresh or frozen vegetables (raw, steamed, roasted, or grilled). Low-sodium or reduced-sodium tomato and vegetable juices. Low-sodium or reduced-sodium tomato sauce and paste. Low-sodium or reduced-sodium canned vegetables.  ?Fruits ?All fresh, canned (in natural juice), or frozen fruits. ?Meat and Other Protein Products ?Ground beef (85% or leaner), grass-fed beef, or beef trimmed of fat. Skinless chicken or [place]. Ground chicken or [place]. Pork trimmed of fat. All fish and seafood. Eggs. Dried beans, peas, or lentils. Unsalted nuts and seeds. Unsalted canned beans. ?Dairy ?Low-fat dairy products, such as skim or 1% milk, 2% or reduced-fat cheeses, low-fat ricotta or cottage cheese, or plain low-fat yogurt. Low-sodium or reduced-sodium cheeses. ?Fats and Oils ?Tub margarines without trans fats. Light or reduced-fat mayonnaise and salad dressings (reduced sodium). Avocado. Safflower, olive, or canola oils. Natural peanut or almond butter. ?Other ?Unsalted popcorn and pretzels. ?The items listed above may not be a complete list of recommended foods or beverages. Contact your dietitian for more options. ?WHAT FOODS ARE NOT RECOMMENDED? ?Grains ?White bread. White pasta. White rice. Refined cornbread. Bagels and croissants. Crackers that contain trans fat. ?Vegetables ?Creamed or fried vegetables. Vegetables in a cheese sauce. Regular canned vegetables. Regular canned tomato sauce and paste. Regular tomato and vegetable juices. ?Fruits ?Dried fruits. Canned fruit in light or heavy syrup. Fruit juice. ?Meat and Other Protein Products ?Fatty cuts of meat. Ribs, chicken wings, bacon, sausage, bologna, salami, chitterlings, fatback, hot dogs, bratwurst, and packaged luncheon meats. Salted nuts and seeds. Canned beans with  salt. ?Dairy ?Whole or 2% milk, cream, half-and-half, and cream cheese. Whole-fat or sweetened yogurt. Full-fat cheeses or blue cheese. Nondairy creamers and whipped toppings. Processed cheese, cheese spreads, or cheese curds. ?Condiments ?Onion and garlic salt, seasoned salt, table salt, and sea salt. Canned and packaged gravies. Worcestershire sauce. Tartar sauce. Barbecue sauce. Teriyaki sauce. Soy sauce, including reduced sodium. Steak sauce. Fish sauce. Oyster sauce. Cocktail sauce. Horseradish. Ketchup and mustard. Meat flavorings and tenderizers. Bouillon cubes. Hot sauce. Tabasco sauce. Marinades. Taco seasonings. Relishes. ?Fats and Oils ?Butter, stick margarine, lard, shortening, ghee, and bacon fat. Coconut, palm kernel, or palm oils. Regular salad dressings. ?Other ?Pickles and olives. Salted popcorn and pretzels. ?The items listed above may not be a complete list of foods and beverages to avoid. Contact your dietitian for more information. ?WHERE CAN I FIND MORE INFORMATION? ?National Heart, Lung, and Blood Institute: travelstabloid.com ?Document Released: 05/02/2011 Document Revised: 09/27/2013 Document Reviewed: 03/17/2013 ?ExitCare? Patient Information ?2015 [employer]. This information is not intended to replace advice given to you by your health care provider. Make sure you discuss any questions you have with your health care provider. ? ? ? ? ?

## 2021-08-30 LAB — COMPLETE METABOLIC PANEL WITH GFR
AG Ratio: 1.8 (calc) (ref 1.0–2.5)
ALT: 39 U/L (ref 9–46)
AST: 45 U/L — ABNORMAL HIGH (ref 10–35)
Albumin: 4.7 g/dL (ref 3.6–5.1)
Alkaline phosphatase (APISO): 63 U/L (ref 35–144)
BUN: 22 mg/dL (ref 7–25)
CO2: 25 mmol/L (ref 20–32)
Calcium: 10.3 mg/dL (ref 8.6–10.3)
Chloride: 105 mmol/L (ref 98–110)
Creat: 0.97 mg/dL (ref 0.70–1.28)
Globulin: 2.6 g/dL (calc) (ref 1.9–3.7)
Glucose, Bld: 96 mg/dL (ref 65–99)
Potassium: 4.4 mmol/L (ref 3.5–5.3)
Sodium: 140 mmol/L (ref 135–146)
Total Bilirubin: 0.5 mg/dL (ref 0.2–1.2)
Total Protein: 7.3 g/dL (ref 6.1–8.1)
eGFR: 84 mL/min/{1.73_m2} (ref >=60)

## 2021-08-30 LAB — CBC WITH DIFFERENTIAL/PLATELET
Absolute Monocytes: 586 cells/uL (ref 200–950)
Basophils Absolute: 49 cells/uL (ref 0–200)
Basophils Relative: 1.2 %
Eosinophils Absolute: 189 cells/uL (ref 15–500)
Eosinophils Relative: 4.6 %
HCT: 45 % (ref 38.5–50.0)
Hemoglobin: 15.5 g/dL (ref 13.2–17.1)
Lymphs Abs: 1156 cells/uL (ref 850–3900)
MCH: 31.6 pg (ref 27.0–33.0)
MCHC: 34.4 g/dL (ref 32.0–36.0)
MCV: 91.8 fL (ref 80.0–100.0)
MPV: 10.8 fL (ref 7.5–12.5)
Monocytes Relative: 14.3 %
Neutro Abs: 2120 cells/uL (ref 1500–7800)
Neutrophils Relative %: 51.7 %
Platelets: 248 10*3/uL (ref 140–400)
RBC: 4.9 10*6/uL (ref 4.20–5.80)
RDW: 13.7 % (ref 11.0–15.0)
Total Lymphocyte: 28.2 %
WBC: 4.1 10*3/uL (ref 3.8–10.8)

## 2021-08-30 LAB — LIPID PANEL
Cholesterol: 156 mg/dL (ref <200)
HDL: 30 mg/dL — ABNORMAL LOW (ref >=40)
LDL Cholesterol (Calc): 91 mg/dL (calc)
Non-HDL Cholesterol (Calc): 126 mg/dL (calc) (ref <130)
Total CHOL/HDL Ratio: 5.2 (calc) — ABNORMAL HIGH (ref <5.0)
Triglycerides: 293 mg/dL — ABNORMAL HIGH (ref <150)

## 2021-08-30 LAB — HEMOGLOBIN A1C
Hgb A1c MFr Bld: 6 % of total Hgb — ABNORMAL HIGH (ref <5.7)
Mean Plasma Glucose: 126 mg/dL
eAG (mmol/L): 7 mmol/L

## 2021-08-30 LAB — MAGNESIUM: Magnesium: 2.3 mg/dL (ref 1.5–2.5)

## 2021-08-30 LAB — TSH: TSH: 1.08 mIU/L (ref 0.40–4.50)

## 2021-09-10 ENCOUNTER — Telehealth: Payer: Self-pay | Admitting: Nurse Practitioner

## 2021-09-10 NOTE — Telephone Encounter (Signed)
Patient is requesting a refill on Sertraline to [facility]. ?

## 2021-09-12 ENCOUNTER — Other Ambulatory Visit: Payer: Self-pay

## 2021-09-12 MED ORDER — SERTRALINE HCL 100 MG PO TABS
ORAL_TABLET | ORAL | 3 refills | Status: DC
Start: 2021-09-12 — End: 2022-04-10

## 2021-09-27 ENCOUNTER — Encounter: Payer: Self-pay | Admitting: Adult Health

## 2021-09-27 ENCOUNTER — Ambulatory Visit (INDEPENDENT_AMBULATORY_CARE_PROVIDER_SITE_OTHER): Payer: Medicare HMO | Admitting: Adult Health

## 2021-09-27 VITALS — BP 172/84 | HR 67 | Temp 97.9°F | Resp 16 | Ht 67.0 in | Wt 226.4 lb

## 2021-09-27 DIAGNOSIS — I10 Essential (primary) hypertension: Secondary | ICD-10-CM | POA: Diagnosis not present

## 2021-09-27 DIAGNOSIS — R058 Other specified cough: Secondary | ICD-10-CM

## 2021-09-27 DIAGNOSIS — J309 Allergic rhinitis, unspecified: Secondary | ICD-10-CM

## 2021-09-27 MED ORDER — HYDROCHLOROTHIAZIDE 25 MG PO TABS: ORAL_TABLET | ORAL | 3 refills | Status: DC

## 2021-09-27 MED ORDER — FEXOFENADINE HCL 180 MG PO TABS: ORAL_TABLET | ORAL | 0 refills | Status: DC

## 2021-09-27 NOTE — Progress Notes (Signed)
Assessment and Plan: ? ?[name] [name] was seen today for follow-up. ? ?Diagnoses and all orders for this visit: ? ?Essential hypertension ?BP cuff calibration and teaching shows similar to manual in office ?Technique reviewed ?Reduce sodium and caffeine in diet, use no salt, reduce canned ?Initiate medication: HCTZ 25 mg in AM ?Continue olmesartan 40 mg daily, lopressor 25 mg BID ?Monitor blood pressure at home; call if consistently over 130/80 ?Discussed DASH diet ?Advised to go to the ER if any CP, SOB, nausea, dizziness, severe HA, changes vision/speech, left arm numbness and tingling and jaw pain. ?Follow up in 4-6 weeks or sooner if concerns ?-     hydrochlorothiazide (HYDRODIURIL) 25 MG tablet; Take 1 tab daily in the morning for blood pressure goal <130/80. ? ?Recurrent dry cough ?Improved with nexium at night, still breakthrough with ? Allergen exposure ?Add fexofenadine ?GERD lifestyle reviewed, avoid triggers, reduce night time portions ? ?Allergic rhinitis, unspecified seasonality, unspecified trigger ?-     fexofenadine (ALLEGRA) 180 MG tablet; Take 1 tab daily as needed for allergies. ? ?Further disposition pending results of labs. Discussed med's effects and SE's.   ?Over 20 minutes of exam, counseling, chart review, and critical decision making was performed.  ? ?Future Appointments  ?Date Time Provider [place]  ?10/29/2021  9:00 AM Deneise Lever, MD [facility] None  ?04/10/2022 10:00 AM Mull, Townsend Roger, NP [facility] None  ?10/01/2022 10:00 AM Liane Comber, NP [facility] None  ? ? ?------------------------------------------------------------------------------------------------------------------ ? ? ?HPI ?BP (!) 172/84   Pulse 67   Temp 97.9 ?F (36.6 ?C)   Resp 16   Ht '5\' 7"'$  (1.702 m)   Wt 226 lb 6.4 oz (102.7 kg)   SpO2 96%   BMI 35.46 kg/m?  ?70 y.o.male with obesity, htn, OSA on CPAP, presents for 1 month follow up on htn and persistent dry cough.  ? ?He reports nexium 20 mg daily as  signficantly improved cough, mostly resolved except after he works in his yard. Hasn't tried antihistamine.  ? ?His blood pressure has not been controlled at home (140s/80-90s), today their BP is BP: (!) 172/84, similar home BP cuff and manual check with good technique  ? He denies chest pain, shortness of breath, dizziness. ?Admits drinking a lot of caffeine, diet high in sodium (canned foods), also fatty foods ? ?On olmesartan 40 mg, metoprolol tartrate 25 mg BID. ? ?BP Readings from Last 3 Encounters:  ?09/27/21 (!) 172/84  ?08/29/21 (!) 158/82  ?07/04/21 (!) 146/95  ? ? ? ?Past Medical History:  ?Diagnosis Date  ? Cardiomegaly   ? Colon polyp   ? Depression   ? Fatty liver disease, nonalcoholic   ? Gallstones   ? Hypertension   ? Hypogonadism male   ? Mixed hyperlipidemia   ? Obesity   ? Pancreas (digestive gland) works poorly   ? Prediabetes   ? Sleep apnea   ? Testosterone deficiency 08/03/2009  ? Thyroid disease   ? Vitamin D deficiency   ?  ? ?Allergies  ?Allergen Reactions  ? Ace Inhibitors Other (See Comments)  ?  cough  ? Bee Venom Other (See Comments)  ? ? ?Current Outpatient Medications on File Prior to Visit  ?Medication Sig  ? Ascorbic Acid (VITAMIN C) 1000 MG tablet Take 1,000 mg by mouth daily.  ? aspirin EC 81 MG tablet Take 81 mg by mouth daily. Swallow whole.  ? B Complex Vitamins (B COMPLEX PO) Take 1 tablet by mouth 3 (three) times a week.  Takes M,W,F in the morning  ? buPROPion (WELLBUTRIN XL) 150 MG 24 hr tablet Take 1 tablet every morning for mood, focus, and concentration  ? Cholecalciferol (VITAMIN D) 50 MCG (2000 UT) tablet Take 6,000 Units by mouth at bedtime.  ? EPINEPHrine (EPIPEN 2-PAK) 0.3 mg/0.3 mL IJ SOAJ injection Inject 0.3 mLs (0.3 mg total) into the muscle once.  ? ezetimibe (ZETIA) 10 MG tablet Take  1 tablet  Daily  for Cholesterol                      /     TAKE ONE TABLET BY MOUTH DAILY FOR CHOLESTEROL  ? Flaxseed, Linseed, (FLAX SEED OIL) 1000 MG CAPS Take 1,000 mg by mouth  in the morning and at bedtime.  ? gemfibrozil (LOPID) 600 MG tablet Take  1 tablet  2 x /day  for Cholesterol & Triglycerides  ? LACTOBACILLUS PROBIOTIC PO Take 1 capsule by mouth in the morning.  ? levothyroxine (SYNTHROID) 50 MCG tablet Take  1 tablet  Daily  on an empty stomach with only water for 30 minutes & no Antacid meds, Calcium or Magnesium for 4 hours & avoid Biotin  ? MAGNESIUM PO Take 750 mg by mouth daily.   ? metoprolol tartrate (LOPRESSOR) 25 MG tablet Take  1 tablet  2 x /day (every 12 hours) for BP  ? Multiple Vitamin (MULTIVITAMIN) capsule Take by mouth.  ? olmesartan (BENICAR) 40 MG tablet Take  1 tablet  Daily  for BP              /      TAKE ONE TABLET BY MOUTH DAILY FOR BLOOD PRESSURE  ? sertraline (ZOLOFT) 100 MG tablet TAKE 1 TABLETS (100 MG) DAILY FOR MOOD  ? ?No current facility-administered medications on file prior to visit.  ? ? ?ROS: all negative except above.  ? ?Physical Exam: ? ?BP (!) 172/84   Pulse 67   Temp 97.9 ?F (36.6 ?C)   Resp 16   Ht '5\' 7"'$  (1.702 m)   Wt 226 lb 6.4 oz (102.7 kg)   SpO2 96%   BMI 35.46 kg/m?  ? ?General Appearance: Well nourished, in no apparent distress. ?Eyes: PERRLA, EOMs, conjunctiva no swelling or erythema ?Sinuses: No Frontal/maxillary tenderness ?ENT/Mouth: Ext aud canals clear, TMs without erythema, bulging. No erythema, swelling, or exudate on post pharynx.  Tonsils not swollen or erythematous. Hearing normal.  ?Neck: Supple, thyroid normal.  ?Respiratory: Respiratory effort normal, BS equal bilaterally without rales, rhonchi, wheezing or stridor.  ?Cardio: RRR with no MRGs. Brisk peripheral pulses without edema.  ?Abdomen: Soft, + BS.  Non tender, no guarding, rebound, hernias, masses. ?Lymphatics: Non tender without lymphadenopathy.  ?Musculoskeletal: Full ROM, 5/5 strength, normal gait.  ?Skin: Warm, dry without rashes, lesions, ecchymosis.  ?Neuro: Cranial nerves intact. Normal muscle tone, no cerebellar symptoms. Sensation intact.   ?Psych: Awake and oriented X 3, normal affect, Insight and Judgment appropriate.  ?  ? ?Izora Ribas, NP ?11:14 AM ?[facility] ? ?

## 2021-10-02 ENCOUNTER — Telehealth: Payer: Self-pay

## 2021-10-02 NOTE — Telephone Encounter (Signed)
LM-10/02/21-Calling pt. To complete CCM to [facility] transfer. Unable to reach pt. LVM.  ? ?Total time spent: 2 min ?

## 2021-10-03 ENCOUNTER — Telehealth: Payer: Self-pay | Admitting: Adult Health

## 2021-10-03 NOTE — Telephone Encounter (Signed)
Patient's wife states that his insurance no longer covers the Generic Allegra. Can he take OTC Zyrtec in place of it? ?

## 2021-10-04 NOTE — Telephone Encounter (Signed)
LM-10/04/21-Calling pt. To complete CCM to [facility] transfer. Unable to rach patient. [place] x2. ? ?Total time spent: 2 min ?

## 2021-10-08 NOTE — Telephone Encounter (Signed)
LM-10/08/21-Calling pt. To complete CCM to [facility] transfer. Unable to reach pt. 3rd attempt. ? ?Total time spent: 2 min. ?

## 2021-10-26 NOTE — Progress Notes (Unsigned)
HPI Male former smoker followed for OSA complicated by obesity, HBP NPSG 08/25/95- Severe obstructive sleep apnea, AHI 58/hr. ---------------------------------------------------------------------------    10/27/20-  71 year old male former smoker(14 pkyrs) followed for OSA, complicated by obesity, HTN, hypothyroid, Hyperlipidemia, Sinus bradycardia,  CPAP auto 5-15/ [facility] Download- compliance 100%, AHI 5.6/ hr Body weight today- 214 lbs Covid vax- 4 [employer] Fell in May- multiple L rib fxs. -----Patient feeling good overall, needs new mask and wants to switch DME's. Sleeping good He describes his CPAP as "friend". Download reviewed and compared with original. He needs replacement mask. Had problems with billing from DME at time of merger [facility] [employer]. They will let us know if there are further problems. Minor stable chronic cough and no residual chest wall pain after rib fractures.  CXR 10/05/20-  IMPRESSION: Displaced rib fractures on the left, stable from 1 day prior. There is left base atelectasis. No pneumothorax or pleural effusion evident. No edema or airspace opacity. Stable cardiac silhouette  10/29/21- 71 year old male former smoker(14 pkyrs) followed for OSA, complicated by obesity, HTN, hypothyroid, Hyperlipidemia, Sinus bradycardia,  CPAP auto 5-15/ [facility] Download- compliance 100%, AHI 3.7/ hr Body weight today- 221 lbs Covid vax- 5 [employer]    ROS- see HPI + = positive Constitutional:   No-   weight loss, night sweats, fevers, chills, fatigue, lassitude. HEENT:   No-  headaches, difficulty swallowing, tooth/dental problems, sore throat,       No-  sneezing, itching, ear ache, nasal congestion, post nasal drip,  CV:  No-   chest pain, orthopnea, PND, swelling in lower extremities, anasarca, dizziness, palpitations Resp: No-   shortness of breath with exertion or at rest.              No-   productive cough,  + non-productive cough,  No- coughing up of blood.               No-   change in color of mucus.  No- wheezing.   Skin: No-   rash or lesions. GI:  No-   heartburn, indigestion, abdominal pain, nausea, vomiting,  GU: . MS:  No-   joint pain or swelling.  Neuro-     nothing unusual Psych:  No- change in mood or affect. No depression or anxiety.  No memory loss.  OBJ General- Alert, Oriented, Affect-appropriate, Distress- none acute; +overweight. + Full beard Skin- rash-none, lesions- none, excoriation- none Lymphadenopathy- none Head- atraumatic            Eyes- Gross vision intact, PERRLA, conjunctivae clear secretions            Ears- Hearing, canals-normal            Nose- Clear, no-Septal dev, mucus, polyps, erosion, perforation             Throat- Mallampati III-IV , mucosa , drainage- none, tonsils- atrophic Neck- flexible , trachea midline, no stridor , thyroid nl, carotid no bruit Chest - symmetrical excursion , unlabored           Heart/CV- RRR , no murmur , no gallop  , no rub, nl s1 s2                           - JVD- none , edema- none, stasis changes- none, varices- none           Lung- clear to P&A, wheeze- none, cough- none , dullness-none, rub- none  Chest wall-  Abd-  Br/ Gen/ Rectal- Not done, not indicated Extrem- cyanosis- none, clubbing, none, atrophy- none, strength- nl Neuro- grossly intact to observation

## 2021-10-29 ENCOUNTER — Encounter: Payer: Self-pay | Admitting: Internal Medicine

## 2021-10-29 ENCOUNTER — Ambulatory Visit: Payer: Medicare HMO | Admitting: Internal Medicine

## 2021-10-29 VITALS — BP 106/72 | HR 65 | Temp 97.5°F | Ht 68.0 in | Wt 221.6 lb

## 2021-10-29 DIAGNOSIS — G4733 Obstructive sleep apnea (adult) (pediatric): Secondary | ICD-10-CM | POA: Diagnosis not present

## 2021-10-29 DIAGNOSIS — M21372 Foot drop, left foot: Secondary | ICD-10-CM

## 2021-10-29 NOTE — Patient Instructions (Addendum)
We can continue CPAP auto 5-15  Order- printed script-    CPAP mask of choice, hoses, filter ,supplies (for AirSense 10 AutoSet)  Please call if we can help

## 2021-11-01 ENCOUNTER — Ambulatory Visit (INDEPENDENT_AMBULATORY_CARE_PROVIDER_SITE_OTHER): Payer: Medicare HMO | Admitting: Adult Health

## 2021-11-01 ENCOUNTER — Encounter: Payer: Self-pay | Admitting: Adult Health

## 2021-11-01 VITALS — BP 128/70 | HR 68 | Temp 95.3°F | Wt 221.6 lb

## 2021-11-01 DIAGNOSIS — Z79899 Other long term (current) drug therapy: Secondary | ICD-10-CM

## 2021-11-01 DIAGNOSIS — I10 Essential (primary) hypertension: Secondary | ICD-10-CM | POA: Diagnosis not present

## 2021-11-01 NOTE — Progress Notes (Signed)
Assessment and Plan:  Demontrae was seen today for hypertension.  Diagnoses and all orders for this visit:  Essential hypertension Much improved! Continue medications: olmesartan 40 mg, HCTZ 25 mg, lopressore 25 mg BID Monitor blood pressure at home; call if consistently over 130/80 Continue DASH diet.   Reminder to go to the ER if any CP, SOB, nausea, dizziness, severe HA, changes vision/speech, left arm numbness and tingling and jaw pain. -     BASIC METABOLIC PANEL WITH GFR  Morbid obesity ([place]) - BMI 30+ with OSA Long discussion about weight loss, diet, and exercise Recommended diet heavy in fruits and veggies and low in animal meats, cheeses, and dairy products, appropriate calorie intake Discussed appropriate weight for height  Follow up at next visit  Medication management -     BASIC METABOLIC PANEL WITH GFR  Further disposition pending results of labs. Discussed med's effects and SE's.   Over 20 minutes of exam, counseling, chart review, and critical decision making was performed.   Future Appointments  Date Time Provider [place]  04/10/2022 10:00 AM Alycia Rossetti, NP [facility] None  10/01/2022 10:00 AM Liane Comber, NP [facility] None  10/31/2022  9:00 AM Deneise Lever, MD [facility] None    ------------------------------------------------------------------------------------------------------------------   HPI BP 128/70   Pulse 68   Temp (!) 95.3 F (35.2 C)   Wt 221 lb 9.6 oz (100.5 kg)   SpO2 99%   BMI 33.69 kg/m  71 y.o.male with obesity, htn, OSA on CPAP, presents for 1 month follow up on htn and persistent dry cough.   On olmesartan 40 mg, metoprolol tartrate 25 mg BID, and HCTZ 25 mg daily was added last visit, presents with BP logs showing 110s-130s/70s. Denies SE. He reports has worked on lifestyle/sodium intake.   BP Readings from Last 3 Encounters:  11/01/21 128/70  10/29/21 106/72  09/27/21 (!) 172/84   BMI is Body mass index  is 33.69 kg/m., he has been working on diet and exercise. Wt Readings from Last 3 Encounters:  11/01/21 221 lb 9.6 oz (100.5 kg)  10/29/21 221 lb 9.6 oz (100.5 kg)  09/27/21 226 lb 6.4 oz (102.7 kg)     Past Medical History:  Diagnosis Date   Cardiomegaly    Colon polyp    Depression    Fatty liver disease, nonalcoholic    Gallstones    Hypertension    Hypogonadism male    Mixed hyperlipidemia    Obesity    Pancreas (digestive gland) works poorly    Prediabetes    Sleep apnea    Testosterone deficiency 08/03/2009   Thyroid disease    Vitamin D deficiency      Allergies  Allergen Reactions   Ace Inhibitors Other (See Comments)    cough   Bee Venom Other (See Comments)    Current Outpatient Medications on File Prior to Visit  Medication Sig   Ascorbic Acid (VITAMIN C) 1000 MG tablet Take 1,000 mg by mouth daily.   aspirin EC 81 MG tablet Take 81 mg by mouth daily. Swallow whole.   B Complex Vitamins (B COMPLEX PO) Take 1 tablet by mouth 3 (three) times a week. Takes M,W,F in the morning   buPROPion (WELLBUTRIN XL) 150 MG 24 hr tablet Take 1 tablet every morning for mood, focus, and concentration   Cholecalciferol (VITAMIN D) 50 MCG (2000 UT) tablet Take 6,000 Units by mouth at bedtime.   EPINEPHrine (EPIPEN 2-PAK) 0.3 mg/0.3 mL IJ SOAJ injection  Inject 0.3 mLs (0.3 mg total) into the muscle once.   Esomeprazole Magnesium 20 MG TBEC Take 1 tablet by mouth daily. At HS   ezetimibe (ZETIA) 10 MG tablet Take  1 tablet  Daily  for Cholesterol                      /     TAKE ONE TABLET BY MOUTH DAILY FOR CHOLESTEROL   fexofenadine (ALLEGRA) 180 MG tablet Take 1 tab daily as needed for allergies.   Flaxseed, Linseed, (FLAX SEED OIL) 1000 MG CAPS Take 1,000 mg by mouth in the morning and at bedtime.   gemfibrozil (LOPID) 600 MG tablet Take  1 tablet  2 x /day  for Cholesterol & Triglycerides   hydrochlorothiazide (HYDRODIURIL) 25 MG tablet Take 1 tab daily in the morning for  blood pressure goal <130/80.   LACTOBACILLUS PROBIOTIC PO Take 1 capsule by mouth in the morning.   levothyroxine (SYNTHROID) 50 MCG tablet Take  1 tablet  Daily  on an empty stomach with only water for 30 minutes & no Antacid meds, Calcium or Magnesium for 4 hours & avoid Biotin   MAGNESIUM PO Take 750 mg by mouth daily.    metoprolol tartrate (LOPRESSOR) 25 MG tablet Take  1 tablet  2 x /day (every 12 hours) for BP   Multiple Vitamin (MULTIVITAMIN) capsule Take by mouth.   olmesartan (BENICAR) 40 MG tablet Take  1 tablet  Daily  for BP              /      TAKE ONE TABLET BY MOUTH DAILY FOR BLOOD PRESSURE   sertraline (ZOLOFT) 100 MG tablet TAKE 1 TABLETS (100 MG) DAILY FOR MOOD   No current facility-administered medications on file prior to visit.    ROS: all negative except above.   Physical Exam:  BP 128/70   Pulse 68   Temp (!) 95.3 F (35.2 C)   Wt 221 lb 9.6 oz (100.5 kg)   SpO2 99%   BMI 33.69 kg/m   General Appearance: Well nourished, in no apparent distress. Eyes: PERRLA, conjunctiva no swelling or erythema ENT/Mouth: mask in place; Hearing normal.  Neck: Supple, thyroid normal.  Respiratory: Respiratory effort normal, BS equal bilaterally without rales, rhonchi, wheezing or stridor.  Cardio: RRR with no MRGs. Brisk peripheral pulses without edema.  Abdomen: Soft, + BS.  Non tender, no guarding, rebound, hernias, masses. Lymphatics: Non tender without lymphadenopathy.  Musculoskeletal: no obvious deformity; normal gait.  Skin: Warm, dry without rashes, lesions, ecchymosis.  Neuro: Normal muscle tone Psych: Awake and oriented X 3, normal affect, Insight and Judgment appropriate.    Izora Ribas, NP 9:43 AM [facility]

## 2021-11-02 LAB — BASIC METABOLIC PANEL WITH GFR
BUN: 21 mg/dL (ref 7–25)
CO2: 28 mmol/L (ref 20–32)
Calcium: 10.1 mg/dL (ref 8.6–10.3)
Chloride: 105 mmol/L (ref 98–110)
Creat: 0.96 mg/dL (ref 0.70–1.28)
Glucose, Bld: 129 mg/dL — ABNORMAL HIGH (ref 65–99)
Potassium: 4.4 mmol/L (ref 3.5–5.3)
Sodium: 144 mmol/L (ref 135–146)
eGFR: 85 mL/min/{1.73_m2} (ref >=60)

## 2021-11-13 DIAGNOSIS — G4730 Sleep apnea, unspecified: Secondary | ICD-10-CM | POA: Diagnosis not present

## 2021-11-13 DIAGNOSIS — G4733 Obstructive sleep apnea (adult) (pediatric): Secondary | ICD-10-CM | POA: Diagnosis not present

## 2021-11-20 ENCOUNTER — Encounter: Payer: Self-pay | Admitting: Internal Medicine

## 2021-11-20 NOTE — Assessment & Plan Note (Signed)
Resulting in some falls He needs to discuss with PCP. Not clear if PT could help.

## 2021-11-20 NOTE — Assessment & Plan Note (Signed)
Benefits from CPAP with good compliance and control Plan- continue autopap 5-15

## 2021-12-13 DIAGNOSIS — G4733 Obstructive sleep apnea (adult) (pediatric): Secondary | ICD-10-CM | POA: Diagnosis not present

## 2021-12-13 DIAGNOSIS — G4730 Sleep apnea, unspecified: Secondary | ICD-10-CM | POA: Diagnosis not present

## 2021-12-27 DIAGNOSIS — H5213 Myopia, bilateral: Secondary | ICD-10-CM | POA: Diagnosis not present

## 2021-12-27 DIAGNOSIS — Z0100 Encounter for examination of eyes and vision without abnormal findings: Secondary | ICD-10-CM | POA: Diagnosis not present

## 2022-01-13 DIAGNOSIS — G4730 Sleep apnea, unspecified: Secondary | ICD-10-CM | POA: Diagnosis not present

## 2022-01-13 DIAGNOSIS — G4733 Obstructive sleep apnea (adult) (pediatric): Secondary | ICD-10-CM | POA: Diagnosis not present

## 2022-02-04 DIAGNOSIS — L821 Other seborrheic keratosis: Secondary | ICD-10-CM | POA: Diagnosis not present

## 2022-02-04 DIAGNOSIS — L814 Other melanin hyperpigmentation: Secondary | ICD-10-CM | POA: Diagnosis not present

## 2022-02-04 DIAGNOSIS — L570 Actinic keratosis: Secondary | ICD-10-CM | POA: Diagnosis not present

## 2022-02-04 DIAGNOSIS — D225 Melanocytic nevi of trunk: Secondary | ICD-10-CM | POA: Diagnosis not present

## 2022-02-12 ENCOUNTER — Ambulatory Visit: Payer: Medicare HMO | Admitting: Pharmacy Technician

## 2022-03-19 ENCOUNTER — Encounter: Payer: Self-pay | Admitting: Internal Medicine

## 2022-03-21 DIAGNOSIS — R69 Illness, unspecified: Secondary | ICD-10-CM | POA: Diagnosis not present

## 2022-03-21 DIAGNOSIS — E785 Hyperlipidemia, unspecified: Secondary | ICD-10-CM | POA: Diagnosis not present

## 2022-03-21 DIAGNOSIS — E039 Hypothyroidism, unspecified: Secondary | ICD-10-CM | POA: Diagnosis not present

## 2022-03-21 DIAGNOSIS — F329 Major depressive disorder, single episode, unspecified: Secondary | ICD-10-CM | POA: Diagnosis not present

## 2022-03-21 DIAGNOSIS — Z6834 Body mass index (BMI) 34.0-34.9, adult: Secondary | ICD-10-CM | POA: Diagnosis not present

## 2022-03-21 DIAGNOSIS — Z87891 Personal history of nicotine dependence: Secondary | ICD-10-CM | POA: Diagnosis not present

## 2022-03-21 DIAGNOSIS — E669 Obesity, unspecified: Secondary | ICD-10-CM | POA: Diagnosis not present

## 2022-03-21 DIAGNOSIS — I10 Essential (primary) hypertension: Secondary | ICD-10-CM | POA: Diagnosis not present

## 2022-04-03 ENCOUNTER — Encounter: Payer: Self-pay | Admitting: Internal Medicine

## 2022-04-09 NOTE — Progress Notes (Unsigned)
CPE  Assessment and Plan: Encounter for general adult medical examination with abnormal findings Due Yearly  Essential hypertension Monitor blood pressure at home; call if consistently over 130/80 Continue Olmesartan 40 mg QD, HCTZ 25 mg QD, Metoprolol 25 mg BID Continue DASH diet.   Reminder to go to the ER if any CP, SOB, nausea, dizziness, severe HA, changes vision/speech, left arm numbness and tingling and jaw pain. - CBC with Differential/Platelet - CMP/GFR   Hypothyroidism, unspecified hypothyroidism type Hypothyroidism-check TSH level, continue medications the same, reminded to take on an empty stomach 30-37mns before food.  - TSH  Abnormal Glucose Discussed general issues about diabetes pathophysiology and management., Educational material distributed., Suggested low cholesterol diet., Encouraged aerobic exercise., Discussed foot care., Reminded to get yearly retinal exam. - Hemoglobin A1c   Hyperlipidemia/ statin myopathy -continue medications, check lipids, decrease fatty foods, increase activity.  - Lipid panel  Morbid Obesity (BMI 30+ with OSA and other co morbidities) - follow up 3 months for progress monitoring - increase veggies, decrease carbs - long discussion about weight loss, diet, and exercise - weight goal of <200 lb set  Abnormal Glucose  Discussed disease and risks Discussed diet/exercise, weight management  A1C   Vitamin D deficiency At goal at recent check; continue to recommend supplementation for goal of 70-100 Defer vitamin D level  Medication management - Magnesium  Left foot drop Monitor, improved with increased exercise follow up ortho PRN  Obstructive sleep apnea Continue CPAP  Erectile dysfunction Previously on testosterone and cialis    Recurrent major depression in partial remission Increase Zoloft to 200 mg Qd, continue wellbutrin- discussed symptoms of serotonin syndrome.  Lifestyle discussed: diet/exerise, sleep hygiene,  stress management, hydration  Screening for ischemic heart disease EKG  Screening for hematuria or proteinuria Routine UA with reflex microscopic Microalbumin/creatinine UA  Screening PSA PSA   Screening for AAA - ABD U/S Retroperitoneal LTD  Discussed med's effects and SE's. Screening labs and tests as requested with regular follow-up as recommended. Over 40 minutes of exam, counseling, chart review and critical decision making was performed Future Appointments  Date Time Provider [place] 04/22/2022  8:00 AM [facility] PREVISIT RM 52 [facility]  05/22/2022  8:00 AM PIrene Shipper MD [facility]  10/01/2022 10:00 AM CLiane Comber NP [facility] None  10/31/2022  9:00 AM YDeneise Lever MD [facility] None  04/11/2023 10:00 AM WAlycia Rossetti NP [facility] None    _____________________________________________________________________________ HPI Patient presents for a CPE and for 3 month follow up on htn, hyperlipidemia, prediabetes, hypothyroidism, obesity, vit D def.   He has sleep apnea on CPAP.   Pain in back of left leg x 4-6 months after lifting wheelbarrow into truck.  Just rested the area as much as possible and took Tylenol.  Has now relieved.    He has hx of miositis and L foot drop and follows with ortho PRN, declined surgery. When turning quickly he will notice his balance is impaired.  In May  he went to move and left foot stuck and he fell and broke 4 ribs. He states symptoms have improved and no longer using his brace on left foot.  Will occasionally use cane  He has dx of depression, currently symptoms do not seem to be controlled on current meds of Zoloft 1000 mg and wellbutrin 150 mg daily.  He has been having some personal issues which is causing his symptoms to worsen.  Would like to increase dosage. He is  also questioning possible ADHD and does plan to schedule an appointment with Dr Johnnye Sima  BMI is Body mass index is 33.39 kg/m.,  he has been working on diet, Has been trying to make better choices. Has not been doing as much exercise Wt Readings from Last 3 Encounters:  04/10/22 216 lb 6.4 oz (98.2 kg)  11/01/21 221 lb 9.6 oz (100.5 kg)  10/29/21 221 lb 9.6 oz (100.5 kg)   He has not been checking BP at home, he is on metoprolol '25mg'$  BID,  and olmesartan 40 mg,  today their BP is BP: 112/62  BP Readings from Last 3 Encounters:  04/10/22 112/62  11/01/21 128/70  10/29/21 106/72  He has a history of LVH with normal EF, last echo 2011, and normal cardiolite 2014. He does not workout.  He denies chest pain, shortness of breath, dizziness.   He is on cholesterol medicationdenies myalgias, had intolerant to statins due to elevated CPK so on zetia and lopid.  His cholesterol is not at goal. States has cut back significantly on cheese. The cholesterol last visit was:   Lab Results  Component Value Date   CHOL 156 08/29/2021   HDL 30 (L) 08/29/2021   LDLCALC 91 08/29/2021   TRIG 293 (H) 08/29/2021   CHOLHDL 5.2 (H) 08/29/2021   He has been working on diet and exercise for prediabetes,  and denies paresthesia of the feet, polydipsia, polyuria and visual disturbances. Last A1C in the office was:  Lab Results  Component Value Date   HGBA1C 6.0 (H) 08/29/2021   Patient is on Vitamin D supplement.   Lab Results  Component Value Date   VD25OH 25 04/10/2021    He is on thyroid medication. His medication was not changed last visit.   Lab Results  Component Value Date   TSH 1.08 08/29/2021    He was following with Dr. Suann Larry years ago for elevated PSA while on testosterone.  Lab Results  Component Value Date   PSA 0.64 04/10/2021    Current Medications:   Current Outpatient Medications (Endocrine & Metabolic):    levothyroxine (SYNTHROID) 50 MCG tablet, Take  1 tablet  Daily  on an empty stomach with only water for 30 minutes & no Antacid meds, Calcium or Magnesium for 4 hours & avoid Biotin  Current Outpatient  Medications (Cardiovascular):    EPINEPHrine (EPIPEN 2-PAK) 0.3 mg/0.3 mL IJ SOAJ injection, Inject 0.3 mLs (0.3 mg total) into the muscle once.   ezetimibe (ZETIA) 10 MG tablet, Take  1 tablet  Daily  for Cholesterol                      /     TAKE ONE TABLET BY MOUTH DAILY FOR CHOLESTEROL   gemfibrozil (LOPID) 600 MG tablet, Take  1 tablet  2 x /day  for Cholesterol & Triglycerides   hydrochlorothiazide (HYDRODIURIL) 25 MG tablet, Take 1 tab daily in the morning for blood pressure goal <130/80.   metoprolol tartrate (LOPRESSOR) 25 MG tablet, Take  1 tablet  2 x /day (every 12 hours) for BP   olmesartan (BENICAR) 40 MG tablet, Take  1 tablet  Daily  for BP              /      TAKE ONE TABLET BY MOUTH DAILY FOR BLOOD PRESSURE  Current Outpatient Medications (Respiratory):    fexofenadine (ALLEGRA) 180 MG tablet, Take 1 tab daily as needed for allergies.  Current Outpatient Medications (Analgesics):    aspirin EC 81 MG tablet, Take 81 mg by mouth daily. Swallow whole.   Current Outpatient Medications (Other):    Ascorbic Acid (VITAMIN C) 1000 MG tablet, Take 1,000 mg by mouth daily.   B Complex Vitamins (B COMPLEX PO), Take 1 tablet by mouth 3 (three) times a week. Takes M,W,F in the morning   buPROPion (WELLBUTRIN XL) 150 MG 24 hr tablet, Take 1 tablet every morning for mood, focus, and concentration   Cholecalciferol (VITAMIN D) 50 MCG (2000 UT) tablet, Take 6,000 Units by mouth at bedtime.   Esomeprazole Magnesium 20 MG TBEC, Take 1 tablet by mouth daily. At HS   Flaxseed, Linseed, (FLAX SEED OIL) 1000 MG CAPS, Take 1,000 mg by mouth in the morning and at bedtime.   LACTOBACILLUS PROBIOTIC PO, Take 1 capsule by mouth in the morning.   MAGNESIUM PO, Take 750 mg by mouth daily.    sertraline (ZOLOFT) 100 MG tablet, TAKE 1 TABLETS (100 MG) DAILY FOR MOOD   Multiple Vitamin (MULTIVITAMIN) capsule, Take by mouth. (Patient not taking: Reported on 04/10/2022)  Health Maintenance:  Immunization  History  Administered Date(s) Administered   Fluad Quad(high Dose 65+) 02/28/2022   Influenza Split 03/06/2012, 02/24/2013, 03/18/2014   Influenza, High Dose Seasonal PF 02/12/2016, 04/09/2017, 02/15/2019, 03/21/2020, 02/13/2021   Influenza-Unspecified 03/03/2015, 02/04/2018   PFIZER Comirnaty(Gray Top)Covid-19 Tri-Sucrose Vaccine 10/24/2020   PFIZER(Purple Top)SARS-COV-2 Vaccination 07/08/2019, 08/12/2019, 03/14/2020   PPD Test 07/26/2013   Pfizer Covid-19 Vaccine Bivalent Booster 44yr & up 02/27/2021   Pneumococcal Conjugate-13 02/12/2016   Pneumococcal Polysaccharide-23 07/26/2013, 01/26/2019   Pneumococcal-Unspecified 05/04/2004   Td 04/07/2019   Tdap 07/24/2011   Unspecified SARS-COV-2 Vaccination 02/28/2022   Zoster Recombinat (Shingrix) 01/28/2019, 04/20/2019   Zoster, Live 12/02/2013   Health Maintenance  Topic Date Due   Medicare Annual Wellness (AWV)  Never done   COLONOSCOPY (Pts 45-445yrInsurance coverage will need to be confirmed)  03/14/2022   COVID-19 Vaccine (7 - Pfizer risk series) 04/25/2022   TETANUS/TDAP  04/06/2029   Pneumonia Vaccine 6542Years old  Completed   INFLUENZA VACCINE  Completed   Hepatitis C Screening  Completed   Zoster Vaccines- Shingrix  Completed   HPV VACCINES  Aged Out     [facility] Dental exam: Dr. SuConley Canallast 2021  Patient Care Team: McUnk PintoMD as PCP - General (Internal Medicine) PeIrene ShipperMD as Consulting Physician (Gastroenterology) ByDyke MaesODSunset HillsOptometry) AdNewton PiggRPBhc Alh[facility] HospitalInactive) as Pharmacist (Pharmacist)  Medical History:  Past Medical History:  Diagnosis Date   Cardiomegaly    Colon polyp    Depression    Fatty liver disease, nonalcoholic    Gallstones    Hypertension    Hypogonadism male    Mixed hyperlipidemia    Obesity    Pancreas (digestive gland) works poorly    Prediabetes    Sleep apnea    Testosterone deficiency 08/03/2009   Thyroid disease     Vitamin D deficiency    Allergies Allergies  Allergen Reactions   Ace Inhibitors Other (See Comments)    cough   Bee Venom Other (See Comments)    SURGICAL HISTORY He  has a past surgical history that includes Cholecystectomy (1976); Appendectomy (1964); Ankle fracture surgery (Left, 1986); Carpal tunnel release (Bilateral, 1996); Tibia IM nail insertion (Right, 03/21/2016); Colonoscopy; and Polypectomy. FAMILY HISTORY His family history includes Diabetes in his father, mother, and sister; Heart disease  in his father; Hypertension in his father. SOCIAL HISTORY He  reports that he quit smoking about 47 years ago. His smoking use included cigarettes. He has a 14.00 pack-year smoking history. He has never used smokeless tobacco. He reports that he does not drink alcohol and does not use drugs.    Review of Systems:  Review of Systems  Constitutional: Negative.  Negative for chills, fever, malaise/fatigue and weight loss.  HENT: Negative.  Negative for congestion, hearing loss, sinus pain, sore throat and tinnitus.   Eyes: Negative.  Negative for blurred vision and double vision.  Respiratory: Negative.  Negative for cough, hemoptysis, sputum production, shortness of breath and wheezing.   Cardiovascular: Negative.  Negative for chest pain, palpitations, orthopnea, claudication, leg swelling and PND.  Gastrointestinal: Negative.  Negative for abdominal pain, blood in stool, constipation, diarrhea, heartburn, melena, nausea and vomiting.  Genitourinary: Negative.  Negative for dysuria and urgency.  Musculoskeletal:  Positive for falls. Negative for back pain, joint pain, myalgias and neck pain.  Skin: Negative.  Negative for rash.  Neurological: Negative.  Negative for dizziness, tingling, tremors, sensory change, weakness and headaches.       Left foot drop  Endo/Heme/Allergies: Negative.  Negative for polydipsia. Does not bruise/bleed easily.  Psychiatric/Behavioral: Negative.  Negative  for depression, memory loss, substance abuse and suicidal ideas. The patient is not nervous/anxious and does not have insomnia.   All other systems reviewed and are negative.   Physical Exam: Estimated body mass index is 33.39 kg/m as calculated from the following:   Height as of this encounter: 5' 7.5" (1.715 m).   Weight as of this encounter: 216 lb 6.4 oz (98.2 kg). BP 112/62   Pulse 62   Temp (!) 97.5 F (36.4 C)   Ht 5' 7.5" (1.715 m)   Wt 216 lb 6.4 oz (98.2 kg)   SpO2 96%   BMI 33.39 kg/m  General Appearance: Well nourished, in no apparent distress.  Eyes: PERRLA, EOMs, conjunctiva no swelling or erythema, normal fundi and vessels.  Sinuses: No Frontal/maxillary tenderness  ENT/Mouth: Ext aud canals impacted bilaterally with cerumen, normal light reflex with TMs without erythema, bulging. Good dentition. No erythema, swelling, or exudate on post pharynx. Tonsils not swollen or erythematous. Hearing normal.  Neck: Supple, thyroid normal. No bruits  Respiratory: Respiratory effort normal, BS equal bilaterally without rales, rhonchi, wheezing or stridor.  Cardio: RRR without murmurs, rubs or gallops. Brisk peripheral pulses without edema.  Chest: symmetric, with normal excursions and percussion.  Abdomen: Soft, obese, nontender, with large vertical and right horizontal scar no guarding, rebound, hernias, masses, or organomegaly.  Lymphatics: Non tender without lymphadenopathy.  Genitourinary: defer Musculoskeletal: Full ROM all peripheral extremities,5/5 strength, and normal gait.  Skin:   Warm, dry without rashes, lesions, ecchymosis.Large old cholescytectomy scar Neuro: Cranial nerves intact, reflexes decreased bilateral legs. Normal muscle tone, no cerebellar symptoms. Sensation intact.  Psych: Awake and oriented X 3, normal affect, Insight and Judgment appropriate.   EKG: Sinus bradycardia, no ST changes AAA: < 3 cm  Vyom Brass E  10:11 AM [facility]

## 2022-04-10 ENCOUNTER — Ambulatory Visit (INDEPENDENT_AMBULATORY_CARE_PROVIDER_SITE_OTHER): Payer: Medicare HMO | Admitting: Nurse Practitioner

## 2022-04-10 ENCOUNTER — Encounter: Payer: Self-pay | Admitting: Nurse Practitioner

## 2022-04-10 VITALS — BP 112/62 | HR 62 | Temp 97.5°F | Ht 67.5 in | Wt 216.4 lb

## 2022-04-10 DIAGNOSIS — R7309 Other abnormal glucose: Secondary | ICD-10-CM | POA: Diagnosis not present

## 2022-04-10 DIAGNOSIS — E782 Mixed hyperlipidemia: Secondary | ICD-10-CM

## 2022-04-10 DIAGNOSIS — Z136 Encounter for screening for cardiovascular disorders: Secondary | ICD-10-CM

## 2022-04-10 DIAGNOSIS — I10 Essential (primary) hypertension: Secondary | ICD-10-CM

## 2022-04-10 DIAGNOSIS — G4733 Obstructive sleep apnea (adult) (pediatric): Secondary | ICD-10-CM

## 2022-04-10 DIAGNOSIS — Z0000 Encounter for general adult medical examination without abnormal findings: Secondary | ICD-10-CM

## 2022-04-10 DIAGNOSIS — Z0001 Encounter for general adult medical examination with abnormal findings: Secondary | ICD-10-CM

## 2022-04-10 DIAGNOSIS — F325 Major depressive disorder, single episode, in full remission: Secondary | ICD-10-CM

## 2022-04-10 DIAGNOSIS — E039 Hypothyroidism, unspecified: Secondary | ICD-10-CM

## 2022-04-10 DIAGNOSIS — Z79899 Other long term (current) drug therapy: Secondary | ICD-10-CM | POA: Diagnosis not present

## 2022-04-10 DIAGNOSIS — I700 Atherosclerosis of aorta: Secondary | ICD-10-CM

## 2022-04-10 DIAGNOSIS — E559 Vitamin D deficiency, unspecified: Secondary | ICD-10-CM | POA: Diagnosis not present

## 2022-04-10 DIAGNOSIS — Z125 Encounter for screening for malignant neoplasm of prostate: Secondary | ICD-10-CM | POA: Diagnosis not present

## 2022-04-10 DIAGNOSIS — F3341 Major depressive disorder, recurrent, in partial remission: Secondary | ICD-10-CM | POA: Diagnosis not present

## 2022-04-10 DIAGNOSIS — N529 Male erectile dysfunction, unspecified: Secondary | ICD-10-CM

## 2022-04-10 DIAGNOSIS — Z1389 Encounter for screening for other disorder: Secondary | ICD-10-CM

## 2022-04-10 DIAGNOSIS — M21372 Foot drop, left foot: Secondary | ICD-10-CM

## 2022-04-10 DIAGNOSIS — R69 Illness, unspecified: Secondary | ICD-10-CM | POA: Diagnosis not present

## 2022-04-10 MED ORDER — SERTRALINE HCL 100 MG PO TABS: ORAL_TABLET | ORAL | 2 refills | Status: DC

## 2022-04-10 NOTE — Patient Instructions (Signed)
Increase zoloft to 200 mg- take 2 two 100 mg tabs  Living With Attention Deficit Hyperactivity Disorder If you have been diagnosed with attention deficit hyperactivity disorder (ADHD), you may be relieved that you now know why you have felt or behaved a certain way. Still, you may feel overwhelmed about the treatment ahead. You may also wonder how to get the support you need and how to deal with the condition day-to-day. With treatment and support, you can live with ADHD and manage your symptoms. How to manage lifestyle changes Managing lifestyle changes can be challenging. Seeking support from your healthcare provider, therapist, family, and friends can be helpful. How to recognize changes in your condition The following signs may mean that your treatment is working well and your condition is improving: Consistently being on time for appointments. Being more organized at home and work. Other people noticing improvements in your behavior. Achieving goals that you set for yourself. Thinking more clearly. The following signs may mean that your treatment is not working very well: Feeling impatience or more confusion. Missing, forgetting, or being late for appointments. An increasing sense of disorganization and messiness. More difficulty in reaching goals that you set for yourself. Loved ones becoming angry or frustrated with you. Follow these instructions at home: Medicines Take over-the-counter and prescription medicines only as told by your health care provider. Check with your health care provider before taking any new medicines. General instructions Create structure and an organized atmosphere at home. For example: Make a list of tasks, then rank them from most important to least important. Work on one task at a time until your listed tasks are done. Make a daily schedule and follow it consistently every day. Use an appointment calendar, and check it 2-3 times a day to keep on track.  Keep it with you when you leave the house. Create spaces where you keep certain things, and always put things back in their places after you use them. Keep all follow-up visits. Your health care provider will need to monitor your condition and adjust your treatment over time. Where to find support Talking to others  Keep emotion out of important discussions and speak in a calm, logical way. Listen closely and patiently to your loved ones. Try to understand their point of view, and try to avoid getting defensive. Take responsibility for the consequences of your actions. Ask that others do not take your behaviors personally. Aim to solve problems as they come up, and express your feelings instead of bottling them up. Talk openly about what you need from your loved ones and how they can support you. Consider going to family therapy sessions or having your family meet with a specialist who deals with ADHD-related behavior problems. Finances Not all insurance plans cover mental health care, so it is important to check with your insurance carrier. If paying for co-pays or counseling services is a problem, search for a local or county mental health care center. Public mental health care services may be offered there at a low cost or no cost when you are not able to see a private health care provider. If you are taking medicine for ADHD, you may be able to get the generic form, which may be less expensive than brand-name medicine. Some makers of prescription medicines also offer help to patients who cannot afford the medicines that they need. Therapy and support groups Talking with a mental health care provider and participating in support groups can help to improve your quality  of life, daily functioning, and overall symptoms. Questions to ask your health care provider: What are the risks and benefits of taking medicines? Would I benefit from therapy? How often should I follow up with a health care  provider? Where to find more information Learn more about ADHD from: Children and Adults with Attention Deficit Hyperactivity Disorder: [employer]add[employer] of Mental Health: https://www.frey.org/ Centers for Disease Control and Prevention: StoreMirror.com.cy Contact a health care provider if: You have side effects from your medicines, such as: Repeated muscle twitches, coughing, or speech outbursts. Sleep problems. Loss of appetite. Dizziness. Unusually fast heartbeat. Stomach pains. Headaches. You have new or worsening behavior problems. You are struggling with anxiety, depression, or substance abuse. Get help right away if: You have a severe reaction to a medicine. These symptoms may be an emergency. Get help right away. Call 911. Do not wait to see if the symptoms will go away. Do not drive yourself to the hospital. Take one of these steps if you feel like you may hurt yourself or others, or have thoughts about taking your own life: Go to your nearest emergency room. Call 911. Call the [facility] at [phone] or 988. This is open 24 hours a day. Text the Crisis Text Line at (418)188-5820. Summary With treatment and support, you can live with ADHD and manage your symptoms. Consider taking part in family therapy or self-help groups with family members or friends. When you talk with friends and family about your ADHD, be patient and communicate openly. Keep all follow-up visits. Your health care provider will need to monitor your condition and adjust your treatment over time. This information is not intended to replace advice given to you by your health care provider. Make sure you discuss any questions you have with your health care provider. Document Revised: 08/31/2021 Document Reviewed: 08/31/2021 Elsevier Patient Education  [place].

## 2022-04-11 LAB — COMPLETE METABOLIC PANEL WITH GFR
AG Ratio: 2 (calc) (ref 1.0–2.5)
ALT: 33 U/L (ref 9–46)
AST: 40 U/L — ABNORMAL HIGH (ref 10–35)
Albumin: 4.7 g/dL (ref 3.6–5.1)
Alkaline phosphatase (APISO): 53 U/L (ref 35–144)
BUN: 22 mg/dL (ref 7–25)
CO2: 29 mmol/L (ref 20–32)
Calcium: 9.9 mg/dL (ref 8.6–10.3)
Chloride: 103 mmol/L (ref 98–110)
Creat: 0.92 mg/dL (ref 0.70–1.28)
Globulin: 2.4 g/dL (calc) (ref 1.9–3.7)
Glucose, Bld: 103 mg/dL — ABNORMAL HIGH (ref 65–99)
Potassium: 4.7 mmol/L (ref 3.5–5.3)
Sodium: 139 mmol/L (ref 135–146)
Total Bilirubin: 0.4 mg/dL (ref 0.2–1.2)
Total Protein: 7.1 g/dL (ref 6.1–8.1)
eGFR: 89 mL/min/{1.73_m2} (ref >=60)

## 2022-04-11 LAB — PSA: PSA: 0.65 ng/mL (ref <=4.00)

## 2022-04-11 LAB — MICROALBUMIN / CREATININE URINE RATIO
Creatinine, Urine: 96 mg/dL (ref 20–320)
Microalb Creat Ratio: 3 mcg/mg creat (ref <30)
Microalb, Ur: 0.3 mg/dL

## 2022-04-11 LAB — CBC WITH DIFFERENTIAL/PLATELET
Absolute Monocytes: 590 cells/uL (ref 200–950)
Basophils Absolute: 29 cells/uL (ref 0–200)
Basophils Relative: 0.8 %
Eosinophils Absolute: 169 cells/uL (ref 15–500)
Eosinophils Relative: 4.7 %
HCT: 39.9 % (ref 38.5–50.0)
Hemoglobin: 13.6 g/dL (ref 13.2–17.1)
Lymphs Abs: 1134 cells/uL (ref 850–3900)
MCH: 29.8 pg (ref 27.0–33.0)
MCHC: 34.1 g/dL (ref 32.0–36.0)
MCV: 87.3 fL (ref 80.0–100.0)
MPV: 10.5 fL (ref 7.5–12.5)
Monocytes Relative: 16.4 %
Neutro Abs: 1678 cells/uL (ref 1500–7800)
Neutrophils Relative %: 46.6 %
Platelets: 230 10*3/uL (ref 140–400)
RBC: 4.57 10*6/uL (ref 4.20–5.80)
RDW: 13 % (ref 11.0–15.0)
Total Lymphocyte: 31.5 %
WBC: 3.6 10*3/uL — ABNORMAL LOW (ref 3.8–10.8)

## 2022-04-11 LAB — VITAMIN D 25 HYDROXY (VIT D DEFICIENCY, FRACTURES): Vit D, 25-Hydroxy: 71 ng/mL (ref 30–100)

## 2022-04-11 LAB — TSH: TSH: 1.12 mIU/L (ref 0.40–4.50)

## 2022-04-11 LAB — URINALYSIS W MICROSCOPIC + REFLEX CULTURE
Bacteria, UA: NONE SEEN /HPF
Bilirubin Urine: NEGATIVE
Glucose, UA: NEGATIVE
Hgb urine dipstick: NEGATIVE
Hyaline Cast: NONE SEEN /LPF
Ketones, ur: NEGATIVE
Leukocyte Esterase: NEGATIVE
Nitrites, Initial: NEGATIVE
Protein, ur: NEGATIVE
RBC / HPF: NONE SEEN /HPF (ref 0–2)
Specific Gravity, Urine: 1.019 (ref 1.001–1.035)
Squamous Epithelial / HPF: NONE SEEN /HPF (ref <=5)
WBC, UA: NONE SEEN /HPF (ref 0–5)
pH: 6.5 (ref 5.0–8.0)

## 2022-04-11 LAB — MAGNESIUM: Magnesium: 2.1 mg/dL (ref 1.5–2.5)

## 2022-04-11 LAB — HEMOGLOBIN A1C
Hgb A1c MFr Bld: 6.2 % of total Hgb — ABNORMAL HIGH (ref <5.7)
Mean Plasma Glucose: 131 mg/dL
eAG (mmol/L): 7.3 mmol/L

## 2022-04-11 LAB — LIPID PANEL
Cholesterol: 151 mg/dL (ref <200)
HDL: 27 mg/dL — ABNORMAL LOW (ref >=40)
Non-HDL Cholesterol (Calc): 124 mg/dL (calc) (ref <130)
Total CHOL/HDL Ratio: 5.6 (calc) — ABNORMAL HIGH (ref <5.0)
Triglycerides: 424 mg/dL — ABNORMAL HIGH (ref <150)

## 2022-04-11 LAB — NO CULTURE INDICATED

## 2022-04-22 ENCOUNTER — Other Ambulatory Visit: Payer: Self-pay

## 2022-04-22 ENCOUNTER — Ambulatory Visit (AMBULATORY_SURGERY_CENTER): Payer: Self-pay | Admitting: *Deleted

## 2022-04-22 VITALS — Ht 67.5 in | Wt 219.0 lb

## 2022-04-22 DIAGNOSIS — Z86010 Personal history of colonic polyps: Secondary | ICD-10-CM

## 2022-04-22 MED ORDER — NA SULFATE-K SULFATE-MG SULF 17.5-3.13-1.6 GM/177ML PO SOLN: 1.0000 | Freq: Once | ORAL | 0 refills | Status: AC

## 2022-04-22 NOTE — Progress Notes (Signed)
Pre visit completed in person. Prep rx sent to mail order pharmacy.  Pt knows to call if no prep delivery one week before colonoscopy date.   No egg or soy allergy known to patient  No issues known to pt with past sedation with any surgeries or procedures Patient denies ever being told they had issues or difficulty with intubation  No FH of Malignant Hyperthermia Pt is not on diet pills Pt is not on  home 02  Pt is not on blood thinners  Pt denies issues with constipation  No A fib or A flutter

## 2022-05-07 DIAGNOSIS — L578 Other skin changes due to chronic exposure to nonionizing radiation: Secondary | ICD-10-CM | POA: Diagnosis not present

## 2022-05-07 DIAGNOSIS — L570 Actinic keratosis: Secondary | ICD-10-CM | POA: Diagnosis not present

## 2022-05-13 DIAGNOSIS — Z1212 Encounter for screening for malignant neoplasm of rectum: Secondary | ICD-10-CM | POA: Diagnosis not present

## 2022-05-13 DIAGNOSIS — Z1211 Encounter for screening for malignant neoplasm of colon: Secondary | ICD-10-CM | POA: Diagnosis not present

## 2022-05-16 ENCOUNTER — Encounter: Payer: Self-pay | Admitting: Internal Medicine

## 2022-05-22 ENCOUNTER — Encounter: Payer: Self-pay | Admitting: Internal Medicine

## 2022-05-22 ENCOUNTER — Ambulatory Visit (AMBULATORY_SURGERY_CENTER): Payer: Medicare HMO | Admitting: Internal Medicine

## 2022-05-22 VITALS — BP 101/59 | HR 63 | Temp 95.9°F | Resp 16 | Ht 67.5 in | Wt 204.0 lb

## 2022-05-22 DIAGNOSIS — K635 Polyp of colon: Secondary | ICD-10-CM | POA: Diagnosis not present

## 2022-05-22 DIAGNOSIS — Z86010 Personal history of colonic polyps: Secondary | ICD-10-CM | POA: Diagnosis not present

## 2022-05-22 DIAGNOSIS — Z09 Encounter for follow-up examination after completed treatment for conditions other than malignant neoplasm: Secondary | ICD-10-CM | POA: Diagnosis not present

## 2022-05-22 DIAGNOSIS — D122 Benign neoplasm of ascending colon: Secondary | ICD-10-CM | POA: Diagnosis not present

## 2022-05-22 MED ORDER — SODIUM CHLORIDE 0.9 % IV SOLN: 500.0000 mL | Freq: Once | INTRAVENOUS | Status: DC

## 2022-05-22 NOTE — Op Note (Signed)
[place] Patient Name: [name] [name] Procedure Date: 05/22/2022 7:57 AM MRN: [id number] Endoscopist: Docia Chuck. Henrene Pastor , MD, [id number] Age: 71 Referring MD:  Date of Birth: 10-10-1950 Gender: Male Account #: [id number] Procedure:                Colonoscopy with cold snare polypectomy x 1 Indications:              High risk colon cancer surveillance: Personal                            history of non-advanced adenoma Medicines:                Monitored Anesthesia Care Procedure:                Pre-Anesthesia Assessment:                           - Prior to the procedure, a History and Physical                            was performed, and patient medications and                            allergies were reviewed. The patient's tolerance of                            previous anesthesia was also reviewed. The risks                            and benefits of the procedure and the sedation                            options and risks were discussed with the patient.                            All questions were answered, and informed consent                            was obtained. Prior Anticoagulants: The patient has                            taken no anticoagulant or antiplatelet agents. ASA                            Grade Assessment: II - A patient with mild systemic                            disease. After reviewing the risks and benefits,                            the patient was deemed in satisfactory condition to                            undergo the procedure.  After obtaining informed consent, the colonoscope                            was passed under direct vision. Throughout the                            procedure, the patient's blood pressure, pulse, and                            oxygen saturations were monitored continuously. The                            CF [id number] #[id number] was introduced through the anus                             and advanced to the the cecum, identified by                            appendiceal orifice and ileocecal valve. The                            ileocecal valve, appendiceal orifice, and rectum                            were photographed. The quality of the bowel                            preparation was excellent. The colonoscopy was                            performed without difficulty. The patient tolerated                            the procedure well. The bowel preparation used was                            SUPREP via split dose instruction. Scope In: 8:28:14 AM Scope Out: 8:42:10 AM Scope Withdrawal Time: 0 hours 11 minutes 7 seconds  Total Procedure Duration: 0 hours 13 minutes 56 seconds  Findings:                 A 3 mm polyp was found in the ascending colon. The                            polyp was sessile. The polyp was removed with a                            cold snare. Resection and retrieval were complete.                           Multiple diverticula were found in the left colon.                           Internal hemorrhoids were  found during                            retroflexion. The hemorrhoids were moderate.                           The exam was otherwise without abnormality on                            direct and retroflexion views. Complications:            No immediate complications. Estimated blood loss:                            None. Estimated Blood Loss:     Estimated blood loss: none. Impression:               - One 3 mm polyp in the ascending colon, removed                            with a cold snare. Resected and retrieved.                           - Diverticulosis in the left colon.                           - Internal hemorrhoids.                           - The examination was otherwise normal on direct                            and retroflexion views. Recommendation:           - Repeat colonoscopy in 7 years for surveillance if                             adenomatous, otherwise no routine surveillance                            recommended.                           - Patient has a contact number available for                            emergencies. The signs and symptoms of potential                            delayed complications were discussed with the                            patient. Return to normal activities tomorrow.                            Written discharge instructions were provided to the  patient.                           - Resume previous diet.                           - Continue present medications.                           - Await pathology results. Docia Chuck. Henrene Pastor, MD 05/22/2022 8:58:54 AM This report has been signed electronically.

## 2022-05-22 NOTE — Progress Notes (Signed)
HISTORY OF PRESENT ILLNESS:  [name] [name] is a 71 y.o. male with a personal history of colon polyps.  Previous examinations 2007, 2012, 2018.  Now for follow-up  REVIEW OF SYSTEMS:  All non-GI ROS negative.   Past Medical History:  Diagnosis Date   Cardiomegaly    Colon polyp    Depression    Fatty liver disease, nonalcoholic    Gallstones    Hypertension    Hypogonadism male    Mixed hyperlipidemia    Obesity    Pancreas (digestive gland) works poorly    Prediabetes    Sleep apnea    Testosterone deficiency 08/03/2009   Thyroid disease    Vitamin D deficiency     Past Surgical History:  Procedure Laterality Date   ANKLE FRACTURE SURGERY Left 1986   APPENDECTOMY  1964   CARPAL TUNNEL RELEASE Bilateral 1996   CHOLECYSTECTOMY  1976   COLONOSCOPY     POLYPECTOMY     TIBIA IM NAIL INSERTION Right 03/21/2016   Procedure: INTRAMEDULLARY (IM) NAIL TIBIAL;  Surgeon: Renette Butters, MD;  Location: [facility];  Service: Orthopedics;  Laterality: Right;    Social History JAKARRI LESKO  reports that he quit smoking about 48 years ago. His smoking use included cigarettes. He has a 14.00 pack-year smoking history. He has never used smokeless tobacco. He reports that he does not drink alcohol and does not use drugs.  family history includes Diabetes in his father, mother, and sister; Heart disease in his father; Hypertension in his father.  Allergies  Allergen Reactions   Ace Inhibitors Other (See Comments)    cough   Bee Venom Other (See Comments)       PHYSICAL EXAMINATION: Vital signs: BP 109/63   Pulse 72   Temp (!) 95.9 F (35.5 C) (Skin)   Resp (!) 6   Ht 5' 7.5" (1.715 m)   Wt 204 lb (92.5 kg)   SpO2 97%   BMI 31.48 kg/m  General: Well-developed, well-nourished, no acute distress HEENT: Sclerae are anicteric, conjunctiva pink. Oral mucosa intact Lungs: Clear Heart: Regular Abdomen: soft, nontender, nondistended, no obvious ascites, no peritoneal signs, normal  bowel sounds. No organomegaly. Extremities: No edema Psychiatric: alert and oriented x3. Cooperative     ASSESSMENT:  History of colon polyps   PLAN:   Surveillance colonoscopy

## 2022-05-22 NOTE — Progress Notes (Signed)
Pt's states no medical or surgical changes since previsit or office visit. 

## 2022-05-22 NOTE — Patient Instructions (Addendum)
Handouts on hemorrhoids, polyps, and diverticulosis given to patient. Await pathology results. Resume previous diet and continue present medications. Repeat colonoscopy for surveillance will be in 7 years, if adenomatous, if not no further surveillance needed.    YOU HAD AN ENDOSCOPIC PROCEDURE TODAY AT [facility]:   Refer to the procedure report that was given to you for any specific questions about what was found during the examination.  If the procedure report does not answer your questions, please call your gastroenterologist to clarify.  If you requested that your care partner not be given the details of your procedure findings, then the procedure report has been included in a sealed envelope for you to review at your convenience later.  YOU SHOULD EXPECT: Some feelings of bloating in the abdomen. Passage of more gas than usual.  Walking can help get rid of the air that was put into your GI tract during the procedure and reduce the bloating. If you had a lower endoscopy (such as a colonoscopy or flexible sigmoidoscopy) you may notice spotting of blood in your stool or on the toilet paper. If you underwent a bowel prep for your procedure, you may not have a normal bowel movement for a few days.  Please Note:  You might notice some irritation and congestion in your nose or some drainage.  This is from the oxygen used during your procedure.  There is no need for concern and it should clear up in a day or so.  SYMPTOMS TO REPORT IMMEDIATELY:  Following lower endoscopy (colonoscopy or flexible sigmoidoscopy):  Excessive amounts of blood in the stool  Significant tenderness or worsening of abdominal pains  Swelling of the abdomen that is new, acute  Fever of 100F or higher  For urgent or emergent issues, a gastroenterologist can be reached at any hour by calling [phone]. Do not use MyChart messaging for urgent concerns.    DIET:  We do recommend a small meal at  first, but then you may proceed to your regular diet.  Drink plenty of fluids but you should avoid alcoholic beverages for 24 hours.  ACTIVITY:  You should plan to take it easy for the rest of today and you should NOT DRIVE or use heavy machinery until tomorrow (because of the sedation medicines used during the test).    FOLLOW UP: Our staff will call the number listed on your records the next business day following your procedure.  We will call around 7:15- 8:00 am to check on you and address any questions or concerns that you may have regarding the information given to you following your procedure. If we do not reach you, we will leave a message.     If any biopsies were taken you will be contacted by phone or by letter within the next 1-3 weeks.  Please call us at [phone] if you have not heard about the biopsies in 3 weeks.    SIGNATURES/CONFIDENTIALITY: You and/or your care partner have signed paperwork which will be entered into your electronic medical record.  These signatures attest to the fact that that the information above on your After Visit Summary has been reviewed and is understood.  Full responsibility of the confidentiality of this discharge information lies with you and/or your care-partner.

## 2022-05-22 NOTE — Progress Notes (Signed)
Called to room to assist during endoscopic procedure.  Patient ID and intended procedure confirmed with present staff. Received instructions for my participation in the procedure from the performing physician.  

## 2022-05-22 NOTE — Progress Notes (Signed)
A and O x3. Report to RN. Tolerated MAC anesthesia well. 

## 2022-05-23 ENCOUNTER — Telehealth: Payer: Self-pay | Admitting: *Deleted

## 2022-05-23 NOTE — Telephone Encounter (Signed)
  Follow up Call-     05/22/2022    7:19 AM  Call back number  Post procedure Call Back phone  # [phone]  Permission to leave phone message Yes     Patient questions:    Message left to call us if necessary.

## 2022-05-29 ENCOUNTER — Encounter: Payer: Self-pay | Admitting: Internal Medicine

## 2022-06-02 ENCOUNTER — Other Ambulatory Visit: Payer: Self-pay | Admitting: Internal Medicine

## 2022-06-02 DIAGNOSIS — I10 Essential (primary) hypertension: Secondary | ICD-10-CM

## 2022-06-13 DIAGNOSIS — L578 Other skin changes due to chronic exposure to nonionizing radiation: Secondary | ICD-10-CM | POA: Diagnosis not present

## 2022-06-13 DIAGNOSIS — L570 Actinic keratosis: Secondary | ICD-10-CM | POA: Diagnosis not present

## 2022-06-17 ENCOUNTER — Other Ambulatory Visit: Payer: Self-pay | Admitting: Internal Medicine

## 2022-06-17 DIAGNOSIS — F325 Major depressive disorder, single episode, in full remission: Secondary | ICD-10-CM

## 2022-06-17 DIAGNOSIS — F3341 Major depressive disorder, recurrent, in partial remission: Secondary | ICD-10-CM

## 2022-06-17 DIAGNOSIS — E782 Mixed hyperlipidemia: Secondary | ICD-10-CM

## 2022-06-17 MED ORDER — BUPROPION HCL ER (XL) 150 MG PO TB24: ORAL_TABLET | ORAL | 3 refills | Status: DC

## 2022-06-17 MED ORDER — EZETIMIBE 10 MG PO TABS: ORAL_TABLET | ORAL | 3 refills | Status: DC

## 2022-08-11 ENCOUNTER — Other Ambulatory Visit: Payer: Self-pay | Admitting: Nurse Practitioner

## 2022-08-11 DIAGNOSIS — I10 Essential (primary) hypertension: Secondary | ICD-10-CM

## 2022-08-19 ENCOUNTER — Other Ambulatory Visit: Payer: Self-pay | Admitting: Internal Medicine

## 2022-08-19 ENCOUNTER — Other Ambulatory Visit: Payer: Self-pay | Admitting: Nurse Practitioner

## 2022-08-19 ENCOUNTER — Other Ambulatory Visit: Payer: Self-pay

## 2022-08-19 ENCOUNTER — Telehealth: Payer: Self-pay | Admitting: Nurse Practitioner

## 2022-08-19 DIAGNOSIS — E039 Hypothyroidism, unspecified: Secondary | ICD-10-CM

## 2022-08-19 MED ORDER — GEMFIBROZIL 600 MG PO TABS: ORAL_TABLET | ORAL | 3 refills | Status: DC

## 2022-08-19 NOTE — Telephone Encounter (Signed)
Pt is requesting refill on gemfibrozil to go to [facility]

## 2022-09-25 ENCOUNTER — Telehealth: Payer: Self-pay | Admitting: Nurse Practitioner

## 2022-09-25 NOTE — Telephone Encounter (Signed)
Patient's wife called and stated that the patient gives platelets at the [facility] every other week but when he went this morning to give, they told him that he couldn't because his iron was too low. He has been really fatigued lately. He has an appt. With you in 3 weeks and his wife would like to know if he should be taking an iron supplement in the meantime until he comes into the office to see you.

## 2022-09-25 NOTE — Telephone Encounter (Signed)
He can take ferrous sulfate 65 mg over the counter once a day

## 2022-10-01 ENCOUNTER — Ambulatory Visit: Payer: Medicare HMO | Admitting: Adult Health

## 2022-10-14 NOTE — Progress Notes (Unsigned)
ANNUAL WELLNESS VISIT AND FOLLOW UP  Assessment and Plan:  Annual Medicare Wellness Visit Due annually  Health maintenance reviewed  Essential hypertension Persistent elevations today Start monitoring blood pressure at home daily and keep log; call if consistently over 130/80 Follow up in 4 weeks with log and home BP cuff for recheck  Continue DASH diet.   Reminder to go to the ER if any CP, SOB, nausea, dizziness, severe HA, changes vision/speech, left arm numbness and tingling and jaw pain. - CBC with Differential/Platelet - CMP/GFR   Hypothyroidism, unspecified hypothyroidism type Hypothyroidism-check TSH level, continue medications the same, reminded to take on an empty stomach 30-7mins before food.  - TSH  Prediabetes Discussed general issues about diabetes pathophysiology and management., Educational material distributed., Suggested low cholesterol diet., Encouraged aerobic exercise., Discussed foot care., Reminded to get yearly retinal exam. - Hemoglobin A1c   Hyperlipidemia/ statin myopathy -continue medications for now, check lipids, decrease fatty foods, increase activity.  - he admits poor lifestyle, has plans to reduce saturated fat - Lipid panel  Morbid Obesity (BMI 30+ with OSA and other co morbidities) - follow up 3 months for progress monitoring - increase veggies, decrease carbs - long discussion about weight loss, diet, and exercise - weight goal of <200 lb set - plan to cut out liquid calories (sugar sweetened tea)  Prediabetes Discussed disease and risks Discussed diet/exercise, weight management  A1C   Vitamin D deficiency At goal at recent check; continue to recommend supplementation for goal of 70-100 Defer vitamin D level  Medication management - Magnesium  Left foot drop Monitor, improved with increased exercise follow up ortho PRN  Obstructive sleep apnea Continue CPAP Weight loss encouraged   Erectile dysfunction Previously on  testosterone and cialis, monitoring only    Major depression in remission (HCC) Continue zoloft, wellbutrin, doing well with reduced doses Lifestyle discussed: diet/exerise, sleep hygiene, stress management, hydration  Fatty Liver - Limit tylenol and alcohol - CMP      Discussed med's effects and SE's. Screening labs and tests as requested with regular follow-up as recommended. Over 40 minutes of exam, counseling, chart review and critical decision making was performed Future Appointments  Date Time Provider Department Center  10/16/2022 11:00 AM Raynelle Dick, NP [facility] None  11/11/2022  1:30 PM Jetty Duhamel D, MD [facility] None  04/11/2023 10:00 AM Raynelle Dick, NP [facility] None    Plan:   During the course of the visit the patient was educated and counseled about appropriate screening and preventive services including:   Pneumococcal vaccine  Prevnar 13 Influenza vaccine Td vaccine Screening electrocardiogram Bone densitometry screening Colorectal cancer screening Diabetes screening Glaucoma screening Nutrition counseling  Advanced directives: requested   _____________________________________________________________________________ HPI 72 y.o. male patient presents for a AWV and for 3 month follow up. He has Hyperlipidemia; Morbid obesity (HCC) - BMI 30+ with OSA; Essential hypertension; Obstructive sleep apnea; Vitamin D deficiency; Left foot drop; Myositis; Medication management; Hypothyroidism; Erectile dysfunction; Major depression in remission (HCC); Abnormal glucose; Fatty liver disease, nonalcoholic; Multiple fractures of ribs, left side, initial encounter for closed fracture; and Recurrent dry cough on their problem list.  He has sleep apnea on CPAP.   He has hx of miositis and L foot drop and follows with ortho PRN, declined surgery. When turning quickly he will notice his balance is impaired.  He has some aching of bil shoulders and knees,  uses topical occasionally. Uses foot brace PRN for foot drop, but notes having  less issues. Reports last fall 09/2020, had rib fractures, has since resolved. Has had several falls but he states all situational, rushes too much, declines PT at this time.   He has dx of depression in remission on zoloft 100 mg daily, and more recently added wellbutrin 150 mg.   BMI is There is no height or weight on file to calculate BMI., he has been working on diet, Has not been going to the [facility] since Covid. Has been trying to make better choices. Fatty liver per [place] 07/2011. He is doing sweet tea, doing 1/2 sugar 1/2 splenda but feels he could cut the sugar out.  Wt Readings from Last 3 Encounters:  05/22/22 204 lb (92.5 kg)  04/22/22 219 lb (99.3 kg)  04/10/22 216 lb 6.4 oz (98.2 kg)   He has a history of LVH with normal EF, last echo 2011, and normal cardiolite 2014.  He has not been checking BP at home, he is on metoprolol 25mg  BID,  and olmesartan 40 mg,  today their BP is    He does get BP checked manually with frequent blood donations, log shows 120s/130s/80s but trending up, last in Feb 2023.   He does not workout.  He denies chest pain, shortness of breath, dizziness.   He is on cholesterol medication and denies myalgias, had intolerant to statins due to elevated CPK (with atorvastatin 80 mg daily) so on zetia and lopid.  His cholesterol is not at goal. States has cut back significantly on cheese. The cholesterol last visit was:   Lab Results  Component Value Date   CHOL 151 04/10/2022   HDL 27 (L) 04/10/2022   LDLCALC  04/10/2022     Comment:     . LDL cholesterol not calculated. Triglyceride levels greater than 400 mg/dL invalidate calculated LDL results. . Reference range: <100 . Desirable range <100 mg/dL for primary prevention;   <70 mg/dL for patients with CHD or diabetic patients  with > or = 2 CHD risk factors. Marland Kitchen LDL-C is now calculated using the Martin-Hopkins  calculation, which is  a validated novel method providing  better accuracy than the Friedewald equation in the  estimation of LDL-C.  Horald Pollen et al. Lenox Ahr. 1610;960(45): 2061-2068  (http://education.[facility].com/faq/FAQ164)    TRIG 424 (H) 04/10/2022   CHOLHDL 5.6 (H) 04/10/2022   He has been working on diet and exercise for prediabetes,  and denies paresthesia of the feet, polydipsia, polyuria and visual disturbances. Last A1C in the office was:  Lab Results  Component Value Date   HGBA1C 6.2 (H) 04/10/2022   Patient is on Vitamin D supplement.   Lab Results  Component Value Date   VD25OH 47 04/10/2022    He is on thyroid medication. His medication was not changed last visit.   Lab Results  Component Value Date   TSH 1.12 04/10/2022    He was following with Dr. Isabel Caprice years ago for elevated PSA while on testosterone.  Lab Results  Component Value Date   PSA 0.65 04/10/2022    Current Medications:   Current Outpatient Medications (Endocrine & Metabolic):    levothyroxine (SYNTHROID) 50 MCG tablet, TAKE 1 TABLET DAILY ON AN EMPTY STOMACH WITH ONLY WATER FOR 30 MINUTES AND NO ANTACID MEDICATIONS, CALCIUM OR MAGNESIUM FOR 4 HOURS AND AVOID BIOTIN  Current Outpatient Medications (Cardiovascular):    EPINEPHrine (EPIPEN 2-PAK) 0.3 mg/0.3 mL IJ SOAJ injection, Inject 0.3 mLs (0.3 mg total) into the muscle once. (Patient not taking: Reported on  05/22/2022)   ezetimibe (ZETIA) 10 MG tablet, Take  1 tablet  Daily  for Cholesterol                                                                           /                                         TAKE                                BY                                      MOUTH   gemfibrozil (LOPID) 600 MG tablet, Take  1 tablet  2 x /day  for Cholesterol & Triglycerides   hydrochlorothiazide (HYDRODIURIL) 25 MG tablet, Take 1 tab daily in the morning for blood pressure goal <130/80.   metoprolol tartrate (LOPRESSOR) 25 MG tablet, TAKE 1 TABLET TWICE A  DAY  FOR BLOOD PRESSURE (EVERY  12 HOURS)   olmesartan (BENICAR) 40 MG tablet, TAKE 1 TABLET DAILY FOR    BLOOD PRESSURE  Current Outpatient Medications (Respiratory):    fexofenadine (ALLEGRA) 180 MG tablet, Take 1 tab daily as needed for allergies.  Current Outpatient Medications (Analgesics):    aspirin EC 81 MG tablet, Take 81 mg by mouth daily. Swallow whole.   Current Outpatient Medications (Other):    Ascorbic Acid (VITAMIN C) 1000 MG tablet, Take 1,000 mg by mouth daily.   B Complex Vitamins (B COMPLEX PO), Take 1 tablet by mouth 3 (three) times a week. Takes M,W,F in the morning   buPROPion (WELLBUTRIN XL) 150 MG 24 hr tablet, Take 1 tablet every Morning for Mood, Focus & Concentration                                                 /                                         TAKE                                   BY                                            MOUTH   Cholecalciferol (VITAMIN D) 50 MCG (2000 UT) tablet, Take 6,000 Units by mouth at bedtime.   Esomeprazole Magnesium 20 MG TBEC, Take 1 tablet by mouth daily. At HS   Flaxseed, Linseed, (FLAX SEED OIL) 1000 MG CAPS,  Take 1,000 mg by mouth in the morning and at bedtime.   LACTOBACILLUS PROBIOTIC PO, Take 1 capsule by mouth in the morning.   MAGNESIUM PO, Take 750 mg by mouth daily.    Multiple Vitamin (MULTIVITAMIN) capsule, Take by mouth.   sertraline (ZOLOFT) 100 MG tablet, 2 tabs by mouth daily  Health Maintenance:  Immunization History  Administered Date(s) Administered   Fluad Quad(high Dose 65+) 02/28/2022   Influenza Split 03/06/2012, 02/24/2013, 03/18/2014   Influenza, High Dose Seasonal PF 02/12/2016, 04/09/2017, 02/15/2019, 03/21/2020, 02/13/2021   Influenza-Unspecified 03/03/2015, 02/04/2018   PFIZER Comirnaty(Gray Top)Covid-19 Tri-Sucrose Vaccine 10/24/2020   PFIZER(Purple Top)SARS-COV-2 Vaccination 07/08/2019, 08/12/2019, 03/14/2020   PPD Test 07/26/2013   Pfizer Covid-19 Vaccine Bivalent Booster 60yrs &  up 02/27/2021   Pneumococcal Conjugate-13 02/12/2016   Pneumococcal Polysaccharide-23 07/26/2013, 01/26/2019   Pneumococcal-Unspecified 05/04/2004   Respiratory Syncytial Virus Vaccine,Recomb Aduvanted(Arexvy) 04/15/2022   Td 04/07/2019   Tdap 07/24/2011   Unspecified SARS-COV-2 Vaccination 02/28/2022   Zoster Recombinat (Shingrix) 01/28/2019, 04/20/2019   Zoster, Live 12/02/2013   Health Maintenance  Topic Date Due   COVID-19 Vaccine (7 - 2023-24 season) 04/25/2022   Medicare Annual Wellness (AWV)  08/30/2022   INFLUENZA VACCINE  12/26/2022   COLONOSCOPY (Pts 45-70yrs Insurance coverage will need to be confirmed)  05/23/2027   DTaP/Tdap/Td (3 - Td or Tdap) 04/06/2029   Pneumonia Vaccine 19+ Years old  Completed   Hepatitis C Screening  Completed   Zoster Vaccines- Shingrix  Completed   HPV VACCINES  Aged Out   Colonoscopy 03/14/2017 - 5 year follow up per Dr. Marina Goodell   EYE Exam: Dr. Shea Evans, [facility] 2022 Dental exam: Dr. Lennice Sites, last 07/2021  Patient Care Team: Lucky Cowboy, MD as PCP - General (Internal Medicine) Hilarie Fredrickson, MD as Consulting Physician (Gastroenterology) Elliot Cousin, OD (Optometry) Charlett Nose, [facility] (Inactive) as Pharmacist (Pharmacist)  Medical History:  Past Medical History:  Diagnosis Date   Cardiomegaly    Colon polyp    Depression    Fatty liver disease, nonalcoholic    Gallstones    Hypertension    Hypogonadism male    Mixed hyperlipidemia    Obesity    Pancreas (digestive gland) works poorly    Prediabetes    Sleep apnea    Testosterone deficiency 08/03/2009   Thyroid disease    Vitamin D deficiency    Allergies Allergies  Allergen Reactions   Ace Inhibitors Other (See Comments)    cough   Bee Venom Other (See Comments)    SURGICAL HISTORY He  has a past surgical history that includes Cholecystectomy (1976); Appendectomy (1964); Ankle fracture surgery (Left, 1986); Carpal tunnel release (Bilateral, 1996); Tibia IM nail insertion  (Right, 03/21/2016); Colonoscopy; and Polypectomy. FAMILY HISTORY His family history includes Diabetes in his father, mother, and sister; Heart disease in his father; Hypertension in his father. SOCIAL HISTORY He  reports that he quit smoking about 48 years ago. His smoking use included cigarettes. He has a 14.00 pack-year smoking history. He has never used smokeless tobacco. He reports that he does not drink alcohol and does not use drugs.   MEDICARE WELLNESS OBJECTIVES: Physical activity:   Cardiac risk factors:   Depression/mood screen:      08/29/2021   12:02 PM  Depression screen PHQ 2/9  Decreased Interest 0  Down, Depressed, Hopeless 0  PHQ - 2 Score 0    ADLs:      No data to display  Cognitive Testing  Alert? Yes  Normal Appearance?Yes  Oriented to person? Yes  Place? Yes   Time? Yes  Recall of three objects?  Yes  Can perform simple calculations? Yes  Displays appropriate judgment?Yes  Can read the correct time from a watch face?Yes  EOL planning:     Review of Systems:  Review of Systems  Constitutional: Negative.  Negative for chills, fever, malaise/fatigue and weight loss.  HENT: Negative.  Negative for congestion, hearing loss, sinus pain, sore throat and tinnitus.   Eyes: Negative.  Negative for blurred vision and double vision.  Respiratory:  Positive for cough (dry, persistent but intermittent). Negative for hemoptysis, sputum production, shortness of breath and wheezing.   Cardiovascular: Negative.  Negative for chest pain, palpitations, orthopnea, claudication, leg swelling and PND.  Gastrointestinal: Negative.  Negative for abdominal pain, blood in stool, constipation, diarrhea, heartburn, melena, nausea and vomiting.  Genitourinary: Negative.  Negative for dysuria and urgency.  Musculoskeletal:  Positive for falls. Negative for back pain, joint pain, myalgias and neck pain.  Skin: Negative.  Negative for rash.  Neurological: Negative.   Negative for dizziness, tingling, tremors, sensory change, weakness and headaches.       Left foot drop, chronic  Endo/Heme/Allergies: Negative.  Negative for polydipsia. Does not bruise/bleed easily.  Psychiatric/Behavioral: Negative.  Negative for depression (controlled on meds), memory loss, substance abuse and suicidal ideas. The patient is not nervous/anxious and does not have insomnia.   All other systems reviewed and are negative.   Physical Exam: Estimated body mass index is 31.48 kg/m as calculated from the following:   Height as of 05/22/22: 5' 7.5" (1.715 m).   Weight as of 05/22/22: 204 lb (92.5 kg). There were no vitals taken for this visit. General Appearance: Well nourished, in no apparent distress.  Eyes: PERRLA, EOMs, conjunctiva no swelling or erythema Sinuses: No Frontal/maxillary tenderness  ENT/Mouth: Ext aud canals impacted bilaterally with cerumen, normal light reflex with TMs without erythema, bulging. Good dentition. No erythema, swelling, or exudate on post pharynx. Tonsils not swollen or erythematous. Hearing normal.  Neck: Supple, thyroid normal. No bruits  Respiratory: Respiratory effort normal, BS equal bilaterally without rales, rhonchi, wheezing or stridor.  Cardio: RRR without murmurs, rubs or gallops. Brisk peripheral pulses without edema.  Chest: symmetric, with normal excursions and percussion.  Abdomen: Soft, obese, nontender, with large vertical and right horizontal scar no guarding, rebound, hernias, masses, or organomegaly.  Lymphatics: Non tender without lymphadenopathy.  Genitourinary: defer Musculoskeletal: Full ROM all peripheral extremities,5/5 strength except 3/5 with L ankle dorsiflexion, and mildly antalgic gait Skin:   Warm, dry without rashes, lesions, ecchymosis. Neuro: Cranial nerves intact, reflexes decreased bilateral legs, worse left than right with decrease dorsiflexion/plantar on left. Normal muscle tone, no cerebellar symptoms.  Sensation intact.  Psych: Awake and oriented X 3, normal affect, Insight and Judgment appropriate.    Medicare Attestation I have personally reviewed: The patient's medical and social history Their use of alcohol, tobacco or illicit drugs Their current medications and supplements The patient's functional ability including ADLs,fall risks, home safety risks, cognitive, and hearing and visual impairment Diet and physical activities Evidence for depression or mood disorders  The patient's weight, height, BMI, and visual acuity have been recorded in the chart.  I have made referrals, counseling, and provided education to the patient based on review of the above and I have provided the patient with a written personalized care plan for preventive services.     Lizzy Hamre E  Aundria Rud 1:16 PM [facility]

## 2022-10-16 ENCOUNTER — Ambulatory Visit (INDEPENDENT_AMBULATORY_CARE_PROVIDER_SITE_OTHER): Payer: Medicare HMO | Admitting: Nurse Practitioner

## 2022-10-16 ENCOUNTER — Encounter: Payer: Self-pay | Admitting: Nurse Practitioner

## 2022-10-16 VITALS — BP 112/60 | HR 55 | Temp 96.8°F | Ht 67.5 in | Wt 207.4 lb

## 2022-10-16 DIAGNOSIS — F325 Major depressive disorder, single episode, in full remission: Secondary | ICD-10-CM

## 2022-10-16 DIAGNOSIS — R6889 Other general symptoms and signs: Secondary | ICD-10-CM | POA: Diagnosis not present

## 2022-10-16 DIAGNOSIS — M21372 Foot drop, left foot: Secondary | ICD-10-CM

## 2022-10-16 DIAGNOSIS — K760 Fatty (change of) liver, not elsewhere classified: Secondary | ICD-10-CM

## 2022-10-16 DIAGNOSIS — R7309 Other abnormal glucose: Secondary | ICD-10-CM | POA: Diagnosis not present

## 2022-10-16 DIAGNOSIS — E782 Mixed hyperlipidemia: Secondary | ICD-10-CM

## 2022-10-16 DIAGNOSIS — I10 Essential (primary) hypertension: Secondary | ICD-10-CM | POA: Diagnosis not present

## 2022-10-16 DIAGNOSIS — E039 Hypothyroidism, unspecified: Secondary | ICD-10-CM | POA: Diagnosis not present

## 2022-10-16 DIAGNOSIS — E559 Vitamin D deficiency, unspecified: Secondary | ICD-10-CM | POA: Diagnosis not present

## 2022-10-16 DIAGNOSIS — D649 Anemia, unspecified: Secondary | ICD-10-CM | POA: Diagnosis not present

## 2022-10-16 DIAGNOSIS — N529 Male erectile dysfunction, unspecified: Secondary | ICD-10-CM | POA: Diagnosis not present

## 2022-10-16 DIAGNOSIS — Z0001 Encounter for general adult medical examination with abnormal findings: Secondary | ICD-10-CM | POA: Diagnosis not present

## 2022-10-16 DIAGNOSIS — Z79899 Other long term (current) drug therapy: Secondary | ICD-10-CM | POA: Diagnosis not present

## 2022-10-16 DIAGNOSIS — G4733 Obstructive sleep apnea (adult) (pediatric): Secondary | ICD-10-CM

## 2022-10-16 NOTE — Patient Instructions (Signed)

## 2022-10-17 LAB — CBC WITH DIFFERENTIAL/PLATELET
Absolute Monocytes: 596 cells/uL (ref 200–950)
Basophils Absolute: 41 cells/uL (ref 0–200)
Basophils Relative: 1.1 %
Eosinophils Absolute: 130 cells/uL (ref 15–500)
Eosinophils Relative: 3.5 %
HCT: 40.1 % (ref 38.5–50.0)
Hemoglobin: 13.2 g/dL (ref 13.2–17.1)
Lymphs Abs: 1080 cells/uL (ref 850–3900)
MCH: 27.9 pg (ref 27.0–33.0)
MCHC: 32.9 g/dL (ref 32.0–36.0)
MCV: 84.8 fL (ref 80.0–100.0)
MPV: 10.8 fL (ref 7.5–12.5)
Monocytes Relative: 16.1 %
Neutro Abs: 1854 cells/uL (ref 1500–7800)
Neutrophils Relative %: 50.1 %
Platelets: 246 10*3/uL (ref 140–400)
RBC: 4.73 10*6/uL (ref 4.20–5.80)
RDW: 17.5 % — ABNORMAL HIGH (ref 11.0–15.0)
Total Lymphocyte: 29.2 %
WBC: 3.7 10*3/uL — ABNORMAL LOW (ref 3.8–10.8)

## 2022-10-17 LAB — LIPID PANEL
Cholesterol: 169 mg/dL (ref <200)
HDL: 32 mg/dL — ABNORMAL LOW (ref >=40)
LDL Cholesterol (Calc): 96 mg/dL (calc)
Non-HDL Cholesterol (Calc): 137 mg/dL (calc) — ABNORMAL HIGH (ref <130)
Total CHOL/HDL Ratio: 5.3 (calc) — ABNORMAL HIGH (ref <5.0)
Triglycerides: 308 mg/dL — ABNORMAL HIGH (ref <150)

## 2022-10-17 LAB — COMPLETE METABOLIC PANEL WITH GFR
AG Ratio: 1.8 (calc) (ref 1.0–2.5)
ALT: 34 U/L (ref 9–46)
AST: 45 U/L — ABNORMAL HIGH (ref 10–35)
Albumin: 4.7 g/dL (ref 3.6–5.1)
Alkaline phosphatase (APISO): 45 U/L (ref 35–144)
BUN: 23 mg/dL (ref 7–25)
CO2: 28 mmol/L (ref 20–32)
Calcium: 10.1 mg/dL (ref 8.6–10.3)
Chloride: 104 mmol/L (ref 98–110)
Creat: 1.15 mg/dL (ref 0.70–1.28)
Globulin: 2.6 g/dL (calc) (ref 1.9–3.7)
Glucose, Bld: 93 mg/dL (ref 65–99)
Potassium: 4.6 mmol/L (ref 3.5–5.3)
Sodium: 142 mmol/L (ref 135–146)
Total Bilirubin: 0.4 mg/dL (ref 0.2–1.2)
Total Protein: 7.3 g/dL (ref 6.1–8.1)
eGFR: 68 mL/min/{1.73_m2} (ref >=60)

## 2022-10-17 LAB — TSH: TSH: 1.4 mIU/L (ref 0.40–4.50)

## 2022-10-17 LAB — HEMOGLOBIN A1C
Hgb A1c MFr Bld: 6 % of total Hgb — ABNORMAL HIGH (ref <5.7)
Mean Plasma Glucose: 126 mg/dL
eAG (mmol/L): 7 mmol/L

## 2022-10-17 LAB — FERRITIN: Ferritin: 43 ng/mL (ref 24–380)

## 2022-10-17 LAB — IRON, TOTAL/TOTAL IRON BINDING CAP
%SAT: 31 % (calc) (ref 20–48)
Iron: 165 ug/dL (ref 50–180)
TIBC: 538 mcg/dL (calc) — ABNORMAL HIGH (ref 250–425)

## 2022-10-30 ENCOUNTER — Telehealth: Payer: Self-pay | Admitting: Nurse Practitioner

## 2022-10-30 NOTE — Telephone Encounter (Signed)
Pt's wife called requesting refill on Hydrochlorothizide for him. Pls send it to [facility] mail service on file.

## 2022-10-31 ENCOUNTER — Ambulatory Visit: Payer: PPO | Admitting: Internal Medicine

## 2022-11-04 ENCOUNTER — Other Ambulatory Visit: Payer: Self-pay

## 2022-11-04 DIAGNOSIS — I10 Essential (primary) hypertension: Secondary | ICD-10-CM

## 2022-11-04 MED ORDER — HYDROCHLOROTHIAZIDE 25 MG PO TABS
25.0000 mg | ORAL_TABLET | Freq: Every morning | ORAL | 3 refills | Status: AC
Start: 2022-11-04 — End: ?
  Filled 2023-07-29: qty 90, 90d supply, fill #0

## 2022-11-09 NOTE — Progress Notes (Signed)
HPI Male former smoker followed for OSA complicated by obesity, HBP NPSG 08/25/95- Severe obstructive sleep apnea, AHI 58/hr. ---------------------------------------------------------------------------   10/29/21- 72 year old male former smoker(14 pkyrs) followed for OSA, complicated by obesity, HTN, hypothyroid, Hyperlipidemia, Sinus bradycardia,  CPAP auto 5-15/ [facility] Download- compliance 100%, AHI 3.7/ hr Body weight today- 221 lbs Covid vax- 5 Phizer Doing well with CPAP. Sleeps ok. Breathing ok. No routine cough or wheeze. Falling more- has left drop-foot.  11/11/22- 72 year old male former smoker(14 pkyrs) followed for OSA, complicated by obesity, HTN, hypothyroid, Hyperlipidemia, Sinus bradycardia,  CPAP auto 5-15/ [facility] Download- compliance 100%, AHI 7/ hr Body weight today- 209 lbs Download reviewed.  "Loves" his CPAP.  We discussed altering his AutoPap range to see if it would impact his residual AHI a bit. Taking Nexium has improved his chronic cough.  He smoked 2 packs/day but says he stopped age 41.  ROS- see HPI + = positive Constitutional:   No-   weight loss, night sweats, fevers, chills, fatigue, lassitude. HEENT:   No-  headaches, difficulty swallowing, tooth/dental problems, sore throat,       No-  sneezing, itching, ear ache, nasal congestion, post nasal drip,  CV:  No-   chest pain, orthopnea, PND, swelling in lower extremities, anasarca, dizziness, palpitations Resp: No-   shortness of breath with exertion or at rest.              No-   productive cough,  + non-productive cough,  No- coughing up of blood.              No-   change in color of mucus.  No- wheezing.   Skin: No-   rash or lesions. GI:  No-   heartburn, indigestion, abdominal pain, nausea, vomiting,  GU: . MS:  No-   joint pain or swelling.  Neuro-     nothing unusual Psych:  No- change in mood or affect. No depression or anxiety.  No memory loss.  OBJ General- Alert, Oriented, Affect-appropriate,  Distress- none acute; +obesity. + Full beard Skin- rash-none, lesions- none, excoriation- none Lymphadenopathy- none Head- atraumatic            Eyes- Gross vision intact, PERRLA, conjunctivae clear secretions            Ears- Hearing, canals-normal            Nose- Clear, no-Septal dev, mucus, polyps, erosion, perforation             Throat- Mallampati III-IV , mucosa , drainage- none, tonsils- atrophic Neck- flexible , trachea midline, no stridor , thyroid nl, carotid no bruit Chest - symmetrical excursion , unlabored           Heart/CV- RRR , no murmur , no gallop  , no rub, nl s1 s2                           - JVD- none , edema- none, stasis changes- none, varices- none           Lung- clear to P&A, wheeze- none, cough- none , dullness-none, rub- none           Chest wall-  Abd-  Br/ Gen/ Rectal- Not done, not indicated Extrem- cyanosis- none, clubbing, none, atrophy- none, strength- nl Neuro-+left drop-foot

## 2022-11-11 ENCOUNTER — Ambulatory Visit: Payer: Medicare HMO | Admitting: Internal Medicine

## 2022-11-11 ENCOUNTER — Encounter: Payer: Self-pay | Admitting: Internal Medicine

## 2022-11-11 VITALS — BP 118/68 | HR 58 | Ht 67.5 in | Wt 209.4 lb

## 2022-11-11 DIAGNOSIS — G4733 Obstructive sleep apnea (adult) (pediatric): Secondary | ICD-10-CM | POA: Diagnosis not present

## 2022-11-11 DIAGNOSIS — R058 Other specified cough: Secondary | ICD-10-CM | POA: Diagnosis not present

## 2022-11-11 NOTE — Patient Instructions (Signed)
Order- DME [facility]- please change autopap range to 5- 20 and provide SD card  Please call if we can help

## 2022-12-11 ENCOUNTER — Encounter: Payer: Self-pay | Admitting: Internal Medicine

## 2022-12-11 NOTE — Assessment & Plan Note (Signed)
Residual AHI is 7.  Mask leak doesn't seem to be a problem despite his beard.  Definitely benefits from CPAP. Plan- change autopap range to 5-20

## 2022-12-11 NOTE — Assessment & Plan Note (Signed)
Nexium seems to help, indicating a reflux component

## 2022-12-19 DIAGNOSIS — L570 Actinic keratosis: Secondary | ICD-10-CM | POA: Diagnosis not present

## 2022-12-19 DIAGNOSIS — Z09 Encounter for follow-up examination after completed treatment for conditions other than malignant neoplasm: Secondary | ICD-10-CM | POA: Diagnosis not present

## 2022-12-19 DIAGNOSIS — L821 Other seborrheic keratosis: Secondary | ICD-10-CM | POA: Diagnosis not present

## 2022-12-19 DIAGNOSIS — D225 Melanocytic nevi of trunk: Secondary | ICD-10-CM | POA: Diagnosis not present

## 2022-12-19 DIAGNOSIS — L578 Other skin changes due to chronic exposure to nonionizing radiation: Secondary | ICD-10-CM | POA: Diagnosis not present

## 2022-12-19 DIAGNOSIS — L814 Other melanin hyperpigmentation: Secondary | ICD-10-CM | POA: Diagnosis not present

## 2023-01-21 ENCOUNTER — Other Ambulatory Visit: Payer: Self-pay | Admitting: Nurse Practitioner

## 2023-01-21 DIAGNOSIS — F3341 Major depressive disorder, recurrent, in partial remission: Secondary | ICD-10-CM

## 2023-03-03 ENCOUNTER — Telehealth: Payer: Self-pay | Admitting: Nurse Practitioner

## 2023-03-03 NOTE — Telephone Encounter (Signed)
Please put in chart

## 2023-03-03 NOTE — Telephone Encounter (Signed)
Pt received flu and pfizer shot today.

## 2023-03-31 DIAGNOSIS — H5213 Myopia, bilateral: Secondary | ICD-10-CM | POA: Diagnosis not present

## 2023-04-02 DIAGNOSIS — Z0100 Encounter for examination of eyes and vision without abnormal findings: Secondary | ICD-10-CM | POA: Diagnosis not present

## 2023-04-10 NOTE — Progress Notes (Signed)
CPE  Assessment and Plan: Encounter for general adult medical examination with abnormal findings Due Yearly  Essential hypertension Monitor blood pressure at home; call if consistently over 130/80 Continue Olmesartan 40 mg QD, HCTZ 25 mg QD, Metoprolol 25 mg BID Continue DASH diet.   Reminder to go to the ER if any CP, SOB, nausea, dizziness, severe HA, changes vision/speech, left arm numbness and tingling and jaw pain. - CBC with Differential/Platelet - CMP/GFR   Hypothyroidism, unspecified hypothyroidism type Hypothyroidism-check TSH level, continue medications the same, reminded to take on an empty stomach 30-22mins before food.  - TSH  Abnormal Glucose Discussed general issues about diabetes pathophysiology and management., Educational material distributed., Suggested low cholesterol diet., Encouraged aerobic exercise., Discussed foot care., Reminded to get yearly retinal exam. - Hemoglobin A1c   Hyperlipidemia/ statin myopathy -continue medications, check lipids, decrease fatty foods, increase activity.  - Lipid panel  Morbid Obesity (BMI 30+ with OSA and other co morbidities) - follow up 3 months for progress monitoring - increase veggies, decrease carbs - long discussion about weight loss, diet, and exercise - weight goal of <200 lb set  Abrasion of left great toe with infection Start Keflex 500 mg BID x 7 days Monitor toenail- appears it will eventually fall off Soak left foot in epsom salts daily   Vitamin D deficiency At goal at recent check; continue to recommend supplementation for goal of 70-100 Defer vitamin D level  Left foot drop Monitor, improved with increased exercise follow up ortho PRN  Obstructive sleep apnea Continue CPAP  Erectile dysfunction Previously on testosterone and cialis    Recurrent major depression in partial remission Stop Zoloft and start Lexapro 20 mg every day Continue Wellbutrin 150 mg every day  Follow up in 4  weeks Lifestyle discussed: diet/exerise, sleep hygiene, stress management, hydration Instructed patient to contact office or on-call physician promptly should condition worsen or any new symptoms appear. IF THE PATIENT HAS ANY SUICIDAL OR HOMICIDAL IDEATIONS, CALL THE OFFICE, DISCUSS WITH A SUPPORT MEMBER, OR GO TO THE ER IMMEDIATELY. Patient was agreeable with this plan.   Screening for ischemic heart disease EKG  Screening for hematuria or proteinuria Routine UA with reflex microscopic Microalbumin/creatinine UA  Screening PSA PSA   Screening for AAA - ABD U/S Retroperitoneal LTD  Medication management -     CBC with Differential/Platelet -     COMPLETE METABOLIC PANEL WITH GFR -     Magnesium -     Lipid panel -     TSH -     Hemoglobin A1C w/out eAG -     VITAMIN D 25 Hydroxy (Vit-D Deficiency, Fractures) -     EKG 12-Lead -     [place], RETROPERITNL ABD,  LTD -     Urinalysis, Routine w reflex microscopic -     Microalbumin / creatinine urine ratio -     PSA       Discussed med's effects and SE's. Screening labs and tests as requested with regular follow-up as recommended. Over 40 minutes of exam, counseling, chart review and critical decision making was performed Future Appointments  Date Time Provider Department Center  11/11/2023  9:00 AM Jetty Duhamel D, MD [facility] None  04/13/2024  9:00 AM Raynelle Dick, NP [facility] None    _____________________________________________________________________________ HPI Patient presents for a CPE and for 3 month follow up on htn, hyperlipidemia, prediabetes, hypothyroidism, obesity, vit D def.   He has sleep apnea on CPAP.   Pain  in back of left great toe. Hit toe into furniture approx 1 week ago and toenail is blackening, surrounding toe is painful and reddened.   He has hx of miositis and L foot drop and follows with ortho PRN, declined surgery. When turning quickly he will notice his balance is impaired . He has  been having some falls but no injuries.  Has restarted using his brace and cane .   He has dx of depression, currently symptoms do not seem to be controlled on current meds of Zoloft 100 mg 2 tabs and wellbutrin 150 mg daily.  He still feeling depressed and does not want to do much.  Finds hard to get motivated. His grandkids do bring him joy.    BMI is Body mass index is 32.56 kg/m., he has been working on diet, Has been trying to make better choices.  Wt Readings from Last 3 Encounters:  04/11/23 211 lb (95.7 kg)  11/11/22 209 lb 6.4 oz (95 kg)  10/16/22 207 lb 6.4 oz (94.1 kg)   He has not been checking BP at home, he is on metoprolol 25mg  BID,  and olmesartan 40 mg, hydrochlorothiazide 25 MG every day  today their BP is BP: 130/80  BP Readings from Last 3 Encounters:  04/11/23 130/80  11/11/22 118/68  10/16/22 112/60  He has a history of LVH with normal EF, last echo 2011, and normal cardiolite 2014. He does not workout.  He denies chest pain, shortness of breath, dizziness.   He is on cholesterol medication, had intolerant to statins due to elevated CPK so on zetia and lopid.  His cholesterol is not at goal. States has cut back significantly on cheese. The cholesterol last visit was:   Lab Results  Component Value Date   CHOL 169 10/16/2022   HDL 32 (L) 10/16/2022   LDLCALC 96 10/16/2022   TRIG 308 (H) 10/16/2022   CHOLHDL 5.3 (H) 10/16/2022   He has been working on diet and exercise for prediabetes,  and denies paresthesia of the feet, polydipsia, polyuria and visual disturbances. Last A1C in the office was:  Lab Results  Component Value Date   HGBA1C 6.0 (H) 10/16/2022   Patient is on Vitamin D supplement.   Lab Results  Component Value Date   VD25OH 13 04/10/2022    He is on thyroid medication, LEVOTHYROXINE 50 mcg every day . His medication was not changed last visit.   Lab Results  Component Value Date   TSH 1.40 10/16/2022    He was following with Dr. Dorise Bullion  years ago for elevated PSA while on testosterone.  Lab Results  Component Value Date   PSA 0.65 04/10/2022    Current Medications:   Current Outpatient Medications (Endocrine & Metabolic):    levothyroxine (SYNTHROID) 50 MCG tablet, TAKE 1 TABLET DAILY ON AN EMPTY STOMACH WITH ONLY WATER FOR 30 MINUTES AND NO ANTACID MEDICATIONS, CALCIUM OR MAGNESIUM FOR 4 HOURS AND AVOID BIOTIN  Current Outpatient Medications (Cardiovascular):    EPINEPHrine (EPIPEN 2-PAK) 0.3 mg/0.3 mL IJ SOAJ injection, Inject 0.3 mLs (0.3 mg total) into the muscle once.   ezetimibe (ZETIA) 10 MG tablet, Take  1 tablet  Daily  for Cholesterol                                                                           /  TAKE                                BY                                      MOUTH   gemfibrozil (LOPID) 600 MG tablet, Take  1 tablet  2 x /day  for Cholesterol & Triglycerides   hydrochlorothiazide (HYDRODIURIL) 25 MG tablet, Take 1 tab daily in the morning for blood pressure goal <130/80.   metoprolol tartrate (LOPRESSOR) 25 MG tablet, TAKE 1 TABLET TWICE A DAY  FOR BLOOD PRESSURE (EVERY  12 HOURS)   olmesartan (BENICAR) 40 MG tablet, TAKE 1 TABLET DAILY FOR    BLOOD PRESSURE  Current Outpatient Medications (Respiratory):    Cetirizine HCl (KLS ALLER-TEC PO), Take by mouth.   fexofenadine (ALLEGRA) 180 MG tablet, Take 1 tab daily as needed for allergies.  Current Outpatient Medications (Analgesics):    aspirin EC 81 MG tablet, Take 81 mg by mouth daily. Swallow whole.  Current Outpatient Medications (Hematological):    Iron, Ferrous Sulfate, 325 (65 Fe) MG TABS, Take by mouth.  Current Outpatient Medications (Other):    Ascorbic Acid (VITAMIN C) 1000 MG tablet, Take 1,000 mg by mouth daily.   B Complex Vitamins (B COMPLEX PO), Take 1 tablet by mouth 3 (three) times a week. Takes M,W,F in the morning   buPROPion (WELLBUTRIN XL) 150 MG 24 hr tablet, Take 1 tablet  every Morning for Mood, Focus & Concentration                                                 /                                         TAKE                                   BY                                            MOUTH   Cholecalciferol (VITAMIN D) 50 MCG (2000 UT) tablet, Take 6,000 Units by mouth at bedtime.   Esomeprazole Magnesium 20 MG TBEC, Take 1 tablet by mouth daily. At HS   Flaxseed, Linseed, (FLAX SEED OIL) 1000 MG CAPS, Take 1,000 mg by mouth in the morning and at bedtime.   LACTOBACILLUS PROBIOTIC PO, Take 1 capsule by mouth in the morning.   MAGNESIUM PO, Take 750 mg by mouth daily.    Multiple Vitamin (MULTIVITAMIN) capsule, Take by mouth.   sertraline (ZOLOFT) 100 MG tablet, TAKE 2 TABLETS DAILY  Health Maintenance:  Immunization History  Administered Date(s) Administered   Fluad Quad(high Dose 65+) 02/28/2022   Influenza Split 03/06/2012, 02/24/2013, 03/18/2014   Influenza, High Dose Seasonal PF 02/12/2016, 04/09/2017, 02/15/2019, 03/21/2020, 02/13/2021, 03/03/2023   Influenza-Unspecified 03/03/2015, 02/04/2018   PFIZER Comirnaty(Gray Top)Covid-19 Tri-Sucrose Vaccine  10/24/2020, 03/03/2023   PFIZER(Purple Top)SARS-COV-2 Vaccination 07/08/2019, 08/12/2019, 03/14/2020   PPD Test 07/26/2013   Pfizer Covid-19 Vaccine Bivalent Booster 75yrs & up 02/27/2021   Pneumococcal Conjugate-13 02/12/2016   Pneumococcal Polysaccharide-23 07/26/2013, 01/26/2019   Pneumococcal-Unspecified 05/04/2004   Respiratory Syncytial Virus Vaccine,Recomb Aduvanted(Arexvy) 04/15/2022   Td 04/07/2019   Tdap 07/24/2011   Unspecified SARS-COV-2 Vaccination 02/28/2022   Zoster Recombinant(Shingrix) 01/28/2019, 04/20/2019   Zoster, Live 12/02/2013   Health Maintenance  Topic Date Due   COVID-19 Vaccine (8 - 2023-24 season) 04/28/2023   Medicare Annual Wellness (AWV)  10/16/2023   Colonoscopy  05/23/2027   DTaP/Tdap/Td (3 - Td or Tdap) 04/06/2029   Pneumonia Vaccine 9+ Years old  Completed    INFLUENZA VACCINE  Completed   Hepatitis C Screening  Completed   Zoster Vaccines- Shingrix  Completed   HPV VACCINES  Aged Out     EYE Exam: Dr. Lawerance Bach, [facility] 2022 Dental exam: Dr. Lendell Caprice, last 2021  Patient Care Team: Lucky Cowboy, MD as PCP - General (Internal Medicine) Hilarie Fredrickson, MD as Consulting Physician (Gastroenterology) Elliot Cousin, OD (Optometry) Charlett Nose, [facility] (Inactive) as Pharmacist (Pharmacist)  Medical History:  Past Medical History:  Diagnosis Date   Cardiomegaly    Colon polyp    Depression    Fatty liver disease, nonalcoholic    Gallstones    Hypertension    Hypogonadism male    Mixed hyperlipidemia    Obesity    Pancreas (digestive gland) works poorly    Prediabetes    Sleep apnea    Testosterone deficiency 08/03/2009   Thyroid disease    Vitamin D deficiency    Allergies Allergies  Allergen Reactions   Ace Inhibitors Other (See Comments)    cough   Bee Venom Other (See Comments)    SURGICAL HISTORY He  has a past surgical history that includes Cholecystectomy (1976); Appendectomy (1964); Ankle fracture surgery (Left, 1986); Carpal tunnel release (Bilateral, 1996); Tibia IM nail insertion (Right, 03/21/2016); Colonoscopy; and Polypectomy. FAMILY HISTORY His family history includes Diabetes in his father, mother, and sister; Heart disease in his father; Hypertension in his father. SOCIAL HISTORY He  reports that he quit smoking about 48 years ago. His smoking use included cigarettes. He started smoking about 55 years ago. He has a 14 pack-year smoking history. He has never used smokeless tobacco. He reports that he does not drink alcohol and does not use drugs.    Review of Systems:  Review of Systems  Constitutional: Negative.  Negative for chills, fever, malaise/fatigue and weight loss.  HENT: Negative.  Negative for congestion, hearing loss, sinus pain, sore throat and tinnitus.   Eyes: Negative.  Negative for blurred  vision and double vision.  Respiratory: Negative.  Negative for cough, hemoptysis, sputum production, shortness of breath and wheezing.   Cardiovascular: Negative.  Negative for chest pain, palpitations, orthopnea, claudication, leg swelling and PND.  Gastrointestinal: Negative.  Negative for abdominal pain, blood in stool, constipation, diarrhea, heartburn, melena, nausea and vomiting.  Genitourinary: Negative.  Negative for dysuria and urgency.  Musculoskeletal:  Positive for falls. Negative for back pain, joint pain, myalgias and neck pain.  Skin: Negative.  Negative for rash.       Left great toe nail blackened and red,swollen  Neurological: Negative.  Negative for dizziness, tingling, tremors, sensory change, weakness and headaches.       Left foot drop  Endo/Heme/Allergies: Negative.  Negative for polydipsia. Does not bruise/bleed easily.  Psychiatric/Behavioral:  Positive  for depression (mood has worsened). Negative for memory loss, substance abuse and suicidal ideas. The patient is not nervous/anxious and does not have insomnia.   All other systems reviewed and are negative.   Physical Exam: Estimated body mass index is 32.56 kg/m as calculated from the following:   Height as of this encounter: 5' 7.5" (1.715 m).   Weight as of this encounter: 211 lb (95.7 kg). BP 130/80   Pulse 67   Temp 97.9 F (36.6 C)   Resp 16   Ht 5' 7.5" (1.715 m)   Wt 211 lb (95.7 kg)   SpO2 99%   BMI 32.56 kg/m  General Appearance: Well nourished, in no apparent distress.  Eyes: PERRLA, EOMs, conjunctiva no swelling or erythema Sinuses: No Frontal/maxillary tenderness  ENT/Mouth: Ext aud canals impacted bilaterally with cerumen, normal light reflex with TMs without erythema, bulging. Good dentition. No erythema, swelling, or exudate on post pharynx. Tonsils not swollen or erythematous. Hearing normal.  Neck: Supple, thyroid normal. No bruits  Respiratory: Respiratory effort normal, BS equal  bilaterally without rales, rhonchi, wheezing or stridor.  Cardio: RRR without murmurs, rubs or gallops. Brisk peripheral pulses without edema.  Chest: symmetric, with normal excursions and percussion.  Abdomen: Soft, obese, nontender, with large vertical and right horizontal scar no guarding, rebound, hernias, masses, or organomegaly.  Lymphatics: Non tender without lymphadenopathy.  Genitourinary: defer Musculoskeletal: Full ROM all peripheral extremities,5/5 strength, and slow antalgic gait, left foot drop- not wearing brace today Skin:   Warm, dry . Left great toenail blackened at base, surrounding tissue is erythematous and swollen Neuro: Cranial nerves intact, reflexes decreased bilateral legs. Normal muscle tone, no cerebellar symptoms. Sensation intact.  Psych: Awake and oriented X 3, normal affect, Insight and Judgment appropriate.   EKG: Sinus bradycardia, no ST changes AAA: < 3 cm  Jaleesa Cervi E  10:08 AM [facility]

## 2023-04-11 ENCOUNTER — Encounter: Payer: Self-pay | Admitting: Nurse Practitioner

## 2023-04-11 ENCOUNTER — Ambulatory Visit (INDEPENDENT_AMBULATORY_CARE_PROVIDER_SITE_OTHER): Payer: Medicare HMO | Admitting: Nurse Practitioner

## 2023-04-11 VITALS — BP 130/80 | HR 67 | Temp 97.9°F | Resp 16 | Ht 67.5 in | Wt 211.0 lb

## 2023-04-11 DIAGNOSIS — R7309 Other abnormal glucose: Secondary | ICD-10-CM

## 2023-04-11 DIAGNOSIS — Z125 Encounter for screening for malignant neoplasm of prostate: Secondary | ICD-10-CM | POA: Diagnosis not present

## 2023-04-11 DIAGNOSIS — M21372 Foot drop, left foot: Secondary | ICD-10-CM

## 2023-04-11 DIAGNOSIS — Z136 Encounter for screening for cardiovascular disorders: Secondary | ICD-10-CM | POA: Diagnosis not present

## 2023-04-11 DIAGNOSIS — Z1389 Encounter for screening for other disorder: Secondary | ICD-10-CM

## 2023-04-11 DIAGNOSIS — E039 Hypothyroidism, unspecified: Secondary | ICD-10-CM | POA: Diagnosis not present

## 2023-04-11 DIAGNOSIS — G720 Drug-induced myopathy: Secondary | ICD-10-CM

## 2023-04-11 DIAGNOSIS — E559 Vitamin D deficiency, unspecified: Secondary | ICD-10-CM

## 2023-04-11 DIAGNOSIS — E782 Mixed hyperlipidemia: Secondary | ICD-10-CM | POA: Diagnosis not present

## 2023-04-11 DIAGNOSIS — Z0001 Encounter for general adult medical examination with abnormal findings: Secondary | ICD-10-CM

## 2023-04-11 DIAGNOSIS — I700 Atherosclerosis of aorta: Secondary | ICD-10-CM | POA: Diagnosis not present

## 2023-04-11 DIAGNOSIS — L089 Local infection of the skin and subcutaneous tissue, unspecified: Secondary | ICD-10-CM

## 2023-04-11 DIAGNOSIS — G4733 Obstructive sleep apnea (adult) (pediatric): Secondary | ICD-10-CM

## 2023-04-11 DIAGNOSIS — Z0000 Encounter for general adult medical examination without abnormal findings: Secondary | ICD-10-CM

## 2023-04-11 DIAGNOSIS — Z79899 Other long term (current) drug therapy: Secondary | ICD-10-CM

## 2023-04-11 DIAGNOSIS — I10 Essential (primary) hypertension: Secondary | ICD-10-CM | POA: Diagnosis not present

## 2023-04-11 DIAGNOSIS — F325 Major depressive disorder, single episode, in full remission: Secondary | ICD-10-CM

## 2023-04-11 DIAGNOSIS — T466X5A Adverse effect of antihyperlipidemic and antiarteriosclerotic drugs, initial encounter: Secondary | ICD-10-CM

## 2023-04-11 MED ORDER — ESCITALOPRAM OXALATE 20 MG PO TABS: 20.0000 mg | ORAL_TABLET | Freq: Every day | ORAL | 2 refills | Status: DC

## 2023-04-11 MED ORDER — CEPHALEXIN 500 MG PO CAPS: 500.0000 mg | ORAL_CAPSULE | Freq: Two times a day (BID) | ORAL | 0 refills | Status: AC

## 2023-04-11 NOTE — Patient Instructions (Addendum)
Stop zoloft and start lexapro the next day Continue Wellbutrin Follow up in 4 weeks  Start Keflex twice a day for 7 days for toe Soak left foot in epsom salts   Major Depressive Disorder, Adult Major depressive disorder (MDD) is a mental health condition. It may also be called clinical depression or unipolar depression. MDD causes symptoms of sadness, hopelessness, and loss of interest in things. These symptoms last most of the day, almost every day, for 2 weeks. MDD can also cause physical symptoms. It can interfere with relationships and activities, such as work, school, and activities that are usually pleasant. MDD may be mild, moderate, or severe. It may be single-episode MDD, which happens once, or recurrent MDD, which may occur many times. What are the causes? The exact cause of this condition is not known. What increases the risk? The following factors may make someone more likely to develop MDD: A family history of depression. Being male. Long-term (chronic) stress, physical illness, other mental health disorders, or substance misuse. Trauma, including: Family problems. Violence or abuse. Loss of a parent or close family member. Experiencing discrimination. What are the signs or symptoms? The main symptoms of MDD usually include: Constant depressed or irritable mood. A loss of interest in activities. Sleeping or eating too much or too little. Tiredness or low energy. Other symptoms include: Unexplained weight gain or weight loss. Being agitated, restless, or weak. Feeling hopeless, worthless, or guilty. Trouble thinking clearly or making decisions. Thoughts of suicide or harming others. Spending a lot of time alone. Not being able to complete daily tasks or work. Severe symptoms of this condition may include: Psychotic depression.This may include false beliefs or delusions. It may also include seeing, hearing, tasting, smelling, or feeling things that are not real  (hallucinations). Chronic depression or persistent depressive disorder. This is low-level depression that lasts for at least 2 years. Melancholic depression, or feeling extremely sad and hopeless. Catatonic depression, which includes trouble speaking and trouble moving. Seasonal depression, which is caused by changes in the seasons. How is this diagnosed? This condition may be diagnosed based on: Your symptoms. Your medical and mental health history. A physical exam. Blood tests to rule out other conditions. MDD is confirmed if you have either a depressed mood or loss of interest and at least four other MDD symptoms, most of the day, nearly every day, in a 2-week period. How is this treated? This condition is usually treated by mental health professionals, such as psychologists, psychiatrists, and clinical social workers. You may need more than one type of treatment. Treatment may include: Psychotherapy, also called talk therapy or counseling. Types of psychotherapy include: Cognitive behavioral therapy (CBT). This teaches you to recognize unhealthy feelings, thoughts, and behaviors, and replace them with positive thoughts and actions. Interpersonal therapy (IPT). This helps you to improve the way you communicate with others or relate to them. Family therapy. This treatment includes members of your family. Medicines to treat anxiety and depression. These medicines help to balance the brain chemicals that affect your emotions. Lifestyle changes. You may be asked to: Limit alcohol use and avoid drug use. Get regular exercise. Get plenty of sleep. Make healthy eating choices. Spend more time outdoors. Brain stimulation. This may be done if symptoms are very severe and other treatments have not worked. Examples of this treatment are electroconvulsive therapy and transcranial magnetic stimulation. Follow these instructions at home: Alcohol use Do not drink alcohol if: Your health care  provider tells you not to  drink. You are pregnant, may be pregnant, or are planning to become pregnant. If you drink alcohol: Limit how much you have to: 0-1 drink a day for women 0-2 drinks a day for men. Know how much alcohol is in your drink. In the [place] one drink equals one 12 oz bottle of beer (355 mL), one 5 oz glass of wine (148 mL), or one 1 oz glass of hard liquor (44 mL). Activity Exercise regularly and spend time outdoors. Find activities that you enjoy and make time to do them. Find healthy ways to manage stress, such as: Meditation or deep breathing. Spending time in nature. Journaling. Return to your normal activities as told by your health care provider. Ask your health care provider what activities are safe for you. General instructions  Take over-the-counter and prescription medicines only as told by your health care provider. Discuss alcohol use with your health care provider. Alcohol can affect any antidepressant medicines you are taking. Discuss any drug use with your health care provider. Eat a healthy diet and get enough sleep. Consider joining a support group. Your health care provider may be able to recommend one. Keep all follow-up visits. It is important for your health care provider to check on your mood, behavior, and medicines. Your health care provider will make changes to your treatment as needed. Where to find more information [employer] on Mental Illness: nami[employer] of Mental Health: [employer].com American Psychiatric Association: psychiatry.org Contact a health care provider if: Your symptoms get worse. You develop new symptoms. Get help right away if: You hurt yourself on purpose (self-harm). You have thoughts about hurting yourself or others. You have hallucinations. Get help right away if you feel like you may hurt yourself or others, or have thoughts about taking your own life. Go to your nearest emergency room or: Call  911. Call the National Suicide Prevention Lifeline at 2198641334 or 988. This is open 24 hours a day. Text the Crisis Text Line at (208)164-9219. This information is not intended to replace advice given to you by your health care provider. Make sure you discuss any questions you have with your health care provider. Document Revised: 09/18/2021 Document Reviewed: 09/18/2021 Elsevier Patient Education  2024 [employer].

## 2023-04-12 LAB — URINALYSIS, ROUTINE W REFLEX MICROSCOPIC
Bacteria, UA: NONE SEEN /[HPF]
Bilirubin Urine: NEGATIVE
Glucose, UA: NEGATIVE
Hgb urine dipstick: NEGATIVE
Hyaline Cast: NONE SEEN /[LPF]
Ketones, ur: NEGATIVE
Nitrite: NEGATIVE
Protein, ur: NEGATIVE
RBC / HPF: NONE SEEN /[HPF] (ref 0–2)
Specific Gravity, Urine: 1.017 (ref 1.001–1.035)
Squamous Epithelial / HPF: NONE SEEN /[HPF] (ref <=5)
WBC, UA: NONE SEEN /[HPF] (ref 0–5)
pH: 6 (ref 5.0–8.0)

## 2023-04-12 LAB — LIPID PANEL
Cholesterol: 155 mg/dL (ref <200)
HDL: 31 mg/dL — ABNORMAL LOW (ref >=40)
LDL Cholesterol (Calc): 88 mg/dL
Non-HDL Cholesterol (Calc): 124 mg/dL (ref <130)
Total CHOL/HDL Ratio: 5 (calc) — ABNORMAL HIGH (ref <5.0)
Triglycerides: 272 mg/dL — ABNORMAL HIGH (ref <150)

## 2023-04-12 LAB — CBC WITH DIFFERENTIAL/PLATELET
Absolute Lymphocytes: 951 {cells}/uL (ref 850–3900)
Absolute Monocytes: 521 {cells}/uL (ref 200–950)
Basophils Absolute: 49 {cells}/uL (ref 0–200)
Basophils Relative: 1.2 %
Eosinophils Absolute: 172 {cells}/uL (ref 15–500)
Eosinophils Relative: 4.2 %
HCT: 42.5 % (ref 38.5–50.0)
Hemoglobin: 14.2 g/dL (ref 13.2–17.1)
MCH: 31.3 pg (ref 27.0–33.0)
MCHC: 33.4 g/dL (ref 32.0–36.0)
MCV: 93.6 fL (ref 80.0–100.0)
MPV: 11.2 fL (ref 7.5–12.5)
Monocytes Relative: 12.7 %
Neutro Abs: 2407 {cells}/uL (ref 1500–7800)
Neutrophils Relative %: 58.7 %
Platelets: 182 10*3/uL (ref 140–400)
RBC: 4.54 10*6/uL (ref 4.20–5.80)
RDW: 13 % (ref 11.0–15.0)
Total Lymphocyte: 23.2 %
WBC: 4.1 10*3/uL (ref 3.8–10.8)

## 2023-04-12 LAB — TSH: TSH: 1.3 m[IU]/L (ref 0.40–4.50)

## 2023-04-12 LAB — COMPLETE METABOLIC PANEL WITH GFR
AG Ratio: 1.8 (calc) (ref 1.0–2.5)
ALT: 27 U/L (ref 9–46)
AST: 39 U/L — ABNORMAL HIGH (ref 10–35)
Albumin: 4.5 g/dL (ref 3.6–5.1)
Alkaline phosphatase (APISO): 47 U/L (ref 35–144)
BUN: 24 mg/dL (ref 7–25)
CO2: 31 mmol/L (ref 20–32)
Calcium: 10.3 mg/dL (ref 8.6–10.3)
Chloride: 104 mmol/L (ref 98–110)
Creat: 1.11 mg/dL (ref 0.70–1.28)
Globulin: 2.5 g/dL (ref 1.9–3.7)
Glucose, Bld: 97 mg/dL (ref 65–99)
Potassium: 4.7 mmol/L (ref 3.5–5.3)
Sodium: 142 mmol/L (ref 135–146)
Total Bilirubin: 0.3 mg/dL (ref 0.2–1.2)
Total Protein: 7 g/dL (ref 6.1–8.1)
eGFR: 71 mL/min/{1.73_m2} (ref >=60)

## 2023-04-12 LAB — MICROSCOPIC MESSAGE

## 2023-04-12 LAB — MICROALBUMIN / CREATININE URINE RATIO
Creatinine, Urine: 101 mg/dL (ref 20–320)
Microalb Creat Ratio: 8 mg/g{creat} (ref <30)
Microalb, Ur: 0.8 mg/dL

## 2023-04-12 LAB — PSA: PSA: 0.77 ng/mL (ref <=4.00)

## 2023-04-12 LAB — VITAMIN D 25 HYDROXY (VIT D DEFICIENCY, FRACTURES): Vit D, 25-Hydroxy: 86 ng/mL (ref 30–100)

## 2023-04-12 LAB — MAGNESIUM: Magnesium: 2.3 mg/dL (ref 1.5–2.5)

## 2023-04-12 LAB — HEMOGLOBIN A1C W/OUT EAG: Hgb A1c MFr Bld: 6.2 %{Hb} — ABNORMAL HIGH (ref <5.7)

## 2023-05-04 ENCOUNTER — Other Ambulatory Visit: Payer: Self-pay | Admitting: Internal Medicine

## 2023-05-04 DIAGNOSIS — I10 Essential (primary) hypertension: Secondary | ICD-10-CM

## 2023-05-13 ENCOUNTER — Encounter: Payer: Self-pay | Admitting: Nurse Practitioner

## 2023-05-13 ENCOUNTER — Ambulatory Visit (INDEPENDENT_AMBULATORY_CARE_PROVIDER_SITE_OTHER): Payer: Medicare HMO | Admitting: Nurse Practitioner

## 2023-05-13 VITALS — BP 124/62 | HR 78 | Temp 97.5°F | Ht 67.5 in | Wt 213.4 lb

## 2023-05-13 DIAGNOSIS — M25522 Pain in left elbow: Secondary | ICD-10-CM | POA: Diagnosis not present

## 2023-05-13 DIAGNOSIS — I10 Essential (primary) hypertension: Secondary | ICD-10-CM

## 2023-05-13 DIAGNOSIS — F325 Major depressive disorder, single episode, in full remission: Secondary | ICD-10-CM | POA: Diagnosis not present

## 2023-05-13 NOTE — Progress Notes (Signed)
Assessment and Plan:  Tayon was seen today for follow-up.  Diagnoses and all orders for this visit:  Major depression in remission (HCC) Continue Wellbutrin 150 mg every day Continue Lexapro 20 mg every day  Monitor symptoms Continue diet , exercise and practice good sleep hygiene  Essential hypertension - continue medications:Olmesartan 40 mg QD, HCTZ 25 mg QD, Metoprolol 25 mg BID  - continue DASH diet, exercise and monitor at home. Call if greater than 130/80.   Left elbow pain Will get xray and treat pending results -     DG Elbow Complete Left; Future       Further disposition pending results of labs. Discussed med's effects and SE's.   Over 30 minutes of exam, counseling, chart review, and critical decision making was performed.   Future Appointments  Date Time Provider Department Center  05/13/2023  2:30 PM Raynelle Dick, NP [facility] None  11/11/2023  9:00 AM Jetty Duhamel D, MD [facility] None  04/13/2024  9:00 AM Raynelle Dick, NP [facility] None    ------------------------------------------------------------------------------------------------------------------   HPI BP 124/62   Pulse 78   Temp (!) 97.5 F (36.4 C)   Ht 5' 7.5" (1.715 m)   Wt 213 lb 6.4 oz (96.8 kg)   SpO2 95%   BMI 32.93 kg/m  72 y.o.male presents for evaluation of depression  At last visit 04/11/23: He has dx of depression, currently symptoms do not seem to be controlled on current meds of Zoloft 100 mg 2 tabs and wellbutrin 150 mg daily.  He still feeling depressed and does not want to do much.  Finds it hard to get motivated. His grandkids do bring him joy.  He was taken off zoloft and started on lexapro 20 mg every day and was to continue wellbutrin 150 mg every day. He has noticed a difference with Lexapro- he is doing more activities and more talkative, more joy. He has been doing his [address] and not dreading it.    Left elbow has tenderness and mild swollen area.  Has been present 6-8 months. When he leans on his elbow he will get a sharp shooting pain  BP well controlled with Olmesartan 40 mg QD, HCTZ 25 mg QD, Metoprolol 25 mg BID  BP Readings from Last 3 Encounters:  05/13/23 124/62  04/11/23 130/80  11/11/22 118/68  Denies headaches, chest pain, shortness of breath and dizziness   BMI is Body mass index is 32.93 kg/m., he has not been working on diet and exercise. Wt Readings from Last 3 Encounters:  05/13/23 213 lb 6.4 oz (96.8 kg)  04/11/23 211 lb (95.7 kg)  11/11/22 209 lb 6.4 oz (95 kg)    Past Medical History:  Diagnosis Date   Cardiomegaly    Colon polyp    Depression    Fatty liver disease, nonalcoholic    Gallstones    Hypertension    Hypogonadism male    Mixed hyperlipidemia    Obesity    Pancreas (digestive gland) works poorly    Prediabetes    Sleep apnea    Testosterone deficiency 08/03/2009   Thyroid disease    Vitamin D deficiency      Allergies  Allergen Reactions   Ace Inhibitors Other (See Comments)    cough   Bee Venom Other (See Comments)    Current Outpatient Medications on File Prior to Visit  Medication Sig   Ascorbic Acid (VITAMIN C) 1000 MG tablet Take 1,000 mg by mouth daily.  aspirin EC 81 MG tablet Take 81 mg by mouth daily. Swallow whole.   B Complex Vitamins (B COMPLEX PO) Take 1 tablet by mouth 3 (three) times a week. Takes M,W,F in the morning   buPROPion (WELLBUTRIN XL) 150 MG 24 hr tablet Take 1 tablet every Morning for Mood, Focus & Concentration                                                 /                                         TAKE                                   BY                                            MOUTH   Cetirizine HCl (KLS ALLER-TEC PO) Take by mouth.   Cholecalciferol (VITAMIN D) 50 MCG (2000 UT) tablet Take 6,000 Units by mouth at bedtime.   EPINEPHrine (EPIPEN 2-PAK) 0.3 mg/0.3 mL IJ SOAJ injection Inject 0.3 mLs (0.3 mg total) into the muscle once.    escitalopram (LEXAPRO) 20 MG tablet Take 1 tablet (20 mg total) by mouth daily.   ezetimibe (ZETIA) 10 MG tablet Take  1 tablet  Daily  for Cholesterol                                                                           /                                         TAKE                                BY                                      MOUTH   fexofenadine (ALLEGRA) 180 MG tablet Take 1 tab daily as needed for allergies.   Flaxseed, Linseed, (FLAX SEED OIL) 1000 MG CAPS Take 1,000 mg by mouth in the morning and at bedtime.   gemfibrozil (LOPID) 600 MG tablet Take  1 tablet  2 x /day  for Cholesterol & Triglycerides   hydrochlorothiazide (HYDRODIURIL) 25 MG tablet Take 1 tab daily in the morning for blood pressure goal <130/80.   LACTOBACILLUS PROBIOTIC PO Take 1 capsule by mouth in the morning.   levothyroxine (SYNTHROID) 50 MCG tablet TAKE 1 TABLET DAILY ON AN EMPTY STOMACH WITH ONLY WATER FOR 30  MINUTES AND NO ANTACID MEDICATIONS, CALCIUM OR MAGNESIUM FOR 4 HOURS AND AVOID BIOTIN   MAGNESIUM PO Take 750 mg by mouth daily.    metoprolol tartrate (LOPRESSOR) 25 MG tablet TAKE 1 TABLET TWICE A DAY  FOR BLOOD PRESSURE (EVERY  12 HOURS)   Multiple Vitamin (MULTIVITAMIN) capsule Take by mouth.   olmesartan (BENICAR) 40 MG tablet TAKE 1 TABLET DAILY FOR    BLOOD PRESSURE   Esomeprazole Magnesium 20 MG TBEC Take 1 tablet by mouth daily. At HS   Iron, Ferrous Sulfate, 325 (65 Fe) MG TABS Take by mouth. (Patient not taking: Reported on 05/13/2023)   No current facility-administered medications on file prior to visit.    ROS: all negative except above.   Physical Exam:  BP 124/62   Pulse 78   Temp (!) 97.5 F (36.4 C)   Ht 5' 7.5" (1.715 m)   Wt 213 lb 6.4 oz (96.8 kg)   SpO2 95%   BMI 32.93 kg/m   General Appearance: Well nourished, in no apparent distress. Eyes: PERRLA, EOMs, conjunctiva no swelling or erythema Sinuses: No Frontal/maxillary tenderness ENT/Mouth: Ext aud canals  clear, TMs without erythema, bulging. No erythema, swelling, or exudate on post pharynx.  Tonsils not swollen or erythematous. Hearing normal.  Neck: Supple, thyroid normal.  Respiratory: Respiratory effort normal, BS equal bilaterally without rales, rhonchi, wheezing or stridor.  Cardio: RRR with no MRGs. Brisk peripheral pulses without edema.  Abdomen: Soft, + BS.  Non tender, no guarding, rebound, hernias, masses. Lymphatics: Non tender without lymphadenopathy.  Musculoskeletal: Full ROM, 5/5 strength, normal gait. Left elbow small firm area interior elbow, tender to touch Skin: Warm, dry without rashes, lesions, ecchymosis.  Neuro: Cranial nerves intact. Normal muscle tone, no cerebellar symptoms. Sensation intact.  Psych: Awake and oriented X 3, normal affect, Insight and Judgment appropriate.     Raynelle Dick, NP 2:26 PM [facility]

## 2023-05-13 NOTE — Patient Instructions (Signed)
Managing Depression, Adult Depression is a mental health condition that affects your thoughts, feelings, and actions. Being diagnosed with depression can bring you relief if you did not know why you have felt or behaved a certain way. It could also leave you feeling overwhelmed. Finding ways to manage your symptoms can help you feel more positive about your future. How to manage lifestyle changes Being depressed is difficult. Depression can increase the level of everyday stress. Stress can make depression symptoms worse. You may believe your symptoms cannot be managed or will never improve. However, there are many things you can try to help manage your symptoms. There is hope. Managing stress  Stress is your body's reaction to life changes and events, both good and bad. Stress can add to your feelings of depression. Learning to manage your stress can help lessen your feelings of depression. Try some of the following approaches to reducing your stress (stress reduction techniques): Listen to music that you enjoy and that inspires you. Try using a meditation app or take a meditation class. Develop a practice that helps you connect with your spiritual self. Walk in nature, pray, or go to a place of worship. Practice deep breathing. To do this, inhale slowly through your nose. Pause at the top of your inhale for a few seconds and then exhale slowly, letting yourself relax. Repeat this three or four times. Practice yoga to help relax and work your muscles. Choose a stress reduction technique that works for you. These techniques take time and practice to develop. Set aside 5-15 minutes a day to do them. Therapists can offer training in these techniques. Do these things to help manage stress: Keep a journal. Know your limits. Set healthy boundaries for yourself and others, such as saying "no" when you think something is too much. Pay attention to how you react to certain situations. You may not be able to  control everything, but you can change your reaction. Add humor to your life by watching funny movies or shows. Make time for activities that you enjoy and that relax you. Spend less time using electronics, especially at night before bed. The light from screens can make your brain think it is time to get up rather than go to bed.  Medicines Medicines, such as antidepressants, are often a part of treatment for depression. Talk with your pharmacist or health care provider about all the medicines, supplements, and herbal products that you take, their possible side effects, and what medicines and other products are safe to take together. Make sure to report any side effects you may have to your health care provider. Relationships Your health care provider may suggest family therapy, couples therapy, or individual therapy as part of your treatment. How to recognize changes Everyone responds differently to treatment for depression. As you recover from depression, you may start to: Have more interest in doing activities. Feel more hopeful. Have more energy. Eat a more regular amount of food. Have better mental focus. It is important to recognize if your depression is not getting better or is getting worse. The symptoms you had in the beginning may return, such as: Feeling tired. Eating too much or too little. Sleeping too much or too little. Feeling restless, agitated, or hopeless. Trouble focusing or making decisions. Having unexplained aches and pains. Feeling irritable, angry, or aggressive. If you or your family members notice these symptoms coming back, let your health care provider know right away. Follow these instructions at home: Activity Try to   get some form of exercise each day, such as walking. Try yoga, mindfulness, or other stress reduction techniques. Participate in group activities if you are able. Lifestyle Get enough sleep. Cut down on or stop using caffeine, tobacco,  alcohol, and any other harmful substances. Eat a healthy diet that includes plenty of vegetables, fruits, whole grains, low-fat dairy products, and lean protein. Limit foods that are high in solid fats, added sugar, or salt (sodium). General instructions Take over-the-counter and prescription medicines only as told by your health care provider. Keep all follow-up visits. It is important for your health care provider to check on your mood, behavior, and medicines. Your health care provider may need to make changes to your treatment. Where to find support Talking to others  Friends and family members can be sources of support and guidance. Talk to trusted friends or family members about your condition. Explain your symptoms and let them know that you are working with a health care provider to treat your depression. Tell friends and family how they can help. Finances Find mental health providers that fit with your financial situation. Talk with your health care provider if you are worried about access to food, housing, or medicine. Call your insurance company to learn about your co-pays and prescription plan. Where to find more information You can find support in your area from: Anxiety and Depression Association of America (ADAA): adaa.org Mental Health America: mentalhealthamerica.net National Alliance on Mental Illness: nami.org Contact a health care provider if: You stop taking your antidepressant medicines, and you have any of these symptoms: Nausea. Headache. Light-headedness. Chills and body aches. Not being able to sleep (insomnia). You or your friends and family think your depression is getting worse. Get help right away if: You have thoughts of hurting yourself or others. Get help right away if you feel like you may hurt yourself or others, or have thoughts about taking your own life. Go to your nearest emergency room or: Call 911. Call the National Suicide Prevention Lifeline at  1-800-273-8255 or 988. This is open 24 hours a day. Text the Crisis Text Line at 741741. This information is not intended to replace advice given to you by your health care provider. Make sure you discuss any questions you have with your health care provider. Document Revised: 09/18/2021 Document Reviewed: 09/18/2021 Elsevier Patient Education  2024 Elsevier Inc.  

## 2023-05-18 ENCOUNTER — Other Ambulatory Visit: Payer: Self-pay | Admitting: Internal Medicine

## 2023-05-18 DIAGNOSIS — E782 Mixed hyperlipidemia: Secondary | ICD-10-CM

## 2023-05-18 DIAGNOSIS — F325 Major depressive disorder, single episode, in full remission: Secondary | ICD-10-CM

## 2023-05-19 ENCOUNTER — Ambulatory Visit
Admission: RE | Admit: 2023-05-19 | Discharge: 2023-05-19 | Disposition: A | Discharge status: Home / Self Care | Payer: Medicare HMO | Source: Ambulatory Visit | Attending: Nurse Practitioner

## 2023-05-19 DIAGNOSIS — M25522 Pain in left elbow: Secondary | ICD-10-CM

## 2023-05-19 DIAGNOSIS — M7989 Other specified soft tissue disorders: Secondary | ICD-10-CM | POA: Diagnosis not present

## 2023-05-27 ENCOUNTER — Telehealth: Payer: Self-pay | Admitting: Nurse Practitioner

## 2023-05-27 ENCOUNTER — Other Ambulatory Visit: Payer: Self-pay | Admitting: Nurse Practitioner

## 2023-05-27 MED ORDER — PREDNISONE 20 MG PO TABS: ORAL_TABLET | ORAL | 0 refills | Status: AC

## 2023-05-27 NOTE — Telephone Encounter (Signed)
 Patient has decided he would like the rx for prednisone that you suggested. His insurance ends tonight. Will you please send in script to [facility] on [address]

## 2023-05-27 NOTE — Telephone Encounter (Signed)
I sent the prescription [facility]

## 2023-05-29 ENCOUNTER — Other Ambulatory Visit (HOSPITAL_COMMUNITY): Payer: Self-pay

## 2023-06-02 ENCOUNTER — Other Ambulatory Visit: Payer: Self-pay

## 2023-06-02 ENCOUNTER — Other Ambulatory Visit (HOSPITAL_COMMUNITY): Payer: Self-pay

## 2023-06-02 DIAGNOSIS — F325 Major depressive disorder, single episode, in full remission: Secondary | ICD-10-CM

## 2023-06-02 MED ORDER — ESCITALOPRAM OXALATE 20 MG PO TABS
20.0000 mg | ORAL_TABLET | Freq: Every day | ORAL | 2 refills | Status: DC
  Filled 2023-06-02: qty 30, 30d supply, fill #0

## 2023-06-30 ENCOUNTER — Telehealth: Payer: Self-pay | Admitting: Nurse Practitioner

## 2023-06-30 ENCOUNTER — Other Ambulatory Visit: Payer: Self-pay | Admitting: Nurse Practitioner

## 2023-06-30 DIAGNOSIS — F325 Major depressive disorder, single episode, in full remission: Secondary | ICD-10-CM

## 2023-06-30 MED ORDER — ESCITALOPRAM OXALATE 20 MG PO TABS
20.0000 mg | ORAL_TABLET | Freq: Every day | ORAL | 3 refills | Status: DC
  Filled 2023-06-30: qty 90, 90d supply, fill #0
  Filled 2023-10-01: qty 90, 90d supply, fill #1

## 2023-06-30 NOTE — Telephone Encounter (Signed)
Patient is requesting a refill on Escitalopram to [facility]

## 2023-07-01 ENCOUNTER — Other Ambulatory Visit (HOSPITAL_COMMUNITY): Payer: Self-pay

## 2023-07-01 ENCOUNTER — Other Ambulatory Visit: Payer: Self-pay

## 2023-07-29 ENCOUNTER — Other Ambulatory Visit (HOSPITAL_COMMUNITY): Payer: Self-pay

## 2023-07-30 ENCOUNTER — Other Ambulatory Visit: Payer: Self-pay

## 2023-08-11 ENCOUNTER — Other Ambulatory Visit: Payer: Self-pay

## 2023-08-11 ENCOUNTER — Other Ambulatory Visit (HOSPITAL_COMMUNITY): Payer: Self-pay

## 2023-08-11 DIAGNOSIS — I10 Essential (primary) hypertension: Secondary | ICD-10-CM

## 2023-08-11 MED ORDER — METOPROLOL TARTRATE 25 MG PO TABS
ORAL_TABLET | ORAL | 0 refills | Status: DC
  Filled 2023-08-11: qty 180, 90d supply, fill #0

## 2023-08-18 ENCOUNTER — Other Ambulatory Visit (HOSPITAL_COMMUNITY): Payer: Self-pay

## 2023-08-18 ENCOUNTER — Other Ambulatory Visit: Payer: Self-pay

## 2023-08-18 ENCOUNTER — Other Ambulatory Visit: Payer: Self-pay | Admitting: Family Medicine

## 2023-08-18 DIAGNOSIS — E039 Hypothyroidism, unspecified: Secondary | ICD-10-CM

## 2023-08-18 MED ORDER — LEVOTHYROXINE SODIUM 50 MCG PO TABS
50.0000 ug | ORAL_TABLET | Freq: Every day | ORAL | 0 refills | Status: DC
  Filled 2023-08-18: qty 30, 30d supply, fill #0

## 2023-08-18 NOTE — Telephone Encounter (Signed)
 Copied from CRM [id number]. Topic: Clinical - Medication Refill >> Aug 18, 2023 12:58 PM [place] wrote: Most Recent Primary Care Visit:   Medication: levothyroxine (SYNTHROID) 50 MCG tablet   Has the patient contacted their pharmacy? Yes (Agent: If no, request that the patient contact the pharmacy for the refill. If patient does not wish to contact the pharmacy document the reason why and proceed with request.) (Agent: If yes, when and what did the pharmacy advise?)  Is this the correct pharmacy for this prescription? Yes If no, delete pharmacy and type the correct one.  This is the patient's preferred pharmacy:  [facility] [address] [place] [address] Phone: [phone] Fax: [phone]   Has the prescription been filled recently? Yes  Is the patient out of the medication? Yes  Has the patient been seen for an appointment in the last year OR does the patient have an upcoming appointment? Yes  Can we respond through MyChart? Yes  Agent: Please be advised that Rx refills may take up to 3 business days. We ask that you follow-up with your pharmacy.

## 2023-09-08 NOTE — Patient Instructions (Incomplete)
 Welcome to [facility]!  Thank you for choosing us  for your Primary Care needs.   We offer in person and video appointments for your convenience. You may call our office to schedule appointments, or you may schedule appointments with me through MyChart.   The best way to get in contact with me is via MyChart message. This will get to me faster than a phone call, unless there is an emergency, then please call 911.  The lab is located downstairs in the Sports Medicine building, we also have xray available there.   COVID test is negative today.  I have sent in paxlovid for you in the case that you develop symptoms over the holiday weekend.  You take this medication twice a day for 5 days.  Do not take cholesterol medication while you are taking Paxlovid.  Stay home and away from everyone for 5 days if you develop symptoms and end up needing to take Paxlovid.  Follow-up here for worsening symptoms, new concerns.  Follow up with me in about 3 months for medication management and labs.

## 2023-09-08 NOTE — Progress Notes (Unsigned)
 New Patient Office Visit  Subjective    Patient ID: [name] [name], male    DOB: [birth year]  Age: 73 y.o. MRN: [id number]  CC: No chief complaint on file.   HPI [name] [name] presents to establish care today. Up to date on routine vaccines. Up to date on routine screenings.  Receives regular dental and eye care.  Reports eating well, sleeping well, feeling well overall.  Reports compliance with medication regimen.  Denies other concerns today.  Outpatient Encounter Medications as of 09/09/2023  Medication Sig  . Ascorbic Acid (VITAMIN C) 1000 MG tablet Take 1,000 mg by mouth daily.  [name] aspirin EC 81 MG tablet Take 81 mg by mouth daily. Swallow whole.  . B Complex Vitamins (B COMPLEX PO) Take 1 tablet by mouth 3 (three) times a week. Takes M,W,F in the morning  . buPROPion (WELLBUTRIN XL) 150 MG 24 hr tablet TAKE 1 TABLET EVERY MORNING  FOR MOOD, FOCUS, AND       CONCENTRATION  . Cetirizine HCl (KLS ALLER-TEC PO) Take by mouth.  . Cholecalciferol (VITAMIN D) 50 MCG (2000 UT) tablet Take 6,000 Units by mouth at bedtime.  [name] EPINEPHrine (EPIPEN 2-PAK) 0.3 mg/0.3 mL IJ SOAJ injection Inject 0.3 mLs (0.3 mg total) into the muscle once.  . escitalopram (LEXAPRO) 20 MG tablet Take 1 tablet (20 mg total) by mouth daily.  . Esomeprazole Magnesium 20 MG TBEC Take 1 tablet by mouth daily. At HS  . ezetimibe (ZETIA) 10 MG tablet TAKE 1 TABLET DAILY FOR    CHOLESTEROL  . fexofenadine (ALLEGRA) 180 MG tablet Take 1 tab daily as needed for allergies.  . Flaxseed, Linseed, (FLAX SEED OIL) 1000 MG CAPS Take 1,000 mg by mouth in the morning and at bedtime.  [name] gemfibrozil (LOPID) 600 MG tablet Take  1 tablet  2 x /day  for Cholesterol & Triglycerides  . hydrochlorothiazide (HYDRODIURIL) 25 MG tablet Take 1 tablet (25 mg total) by mouth in the morning for blood pressure goal <130/80.  [name] LACTOBACILLUS PROBIOTIC PO Take 1 capsule by mouth in the morning.  [name] levothyroxine (SYNTHROID) 50 MCG tablet Take 1  tablet (50 mcg total) by mouth daily ON AN EMPTY STOMACH WITH ONLY WATER FOR 30 MINUTES AND NO ANTACID MEDICATIONS, CALCIUM OR MAGNESIUM FOR 4 HOURS AND AVOID BIOTIN  . MAGNESIUM PO Take 750 mg by mouth daily.   . metoprolol tartrate (LOPRESSOR) 25 MG tablet TAKE 1 TABLET TWICE A DAY  FOR BLOOD PRESSURE (EVERY  12 HOURS)  . Multiple Vitamin (MULTIVITAMIN) capsule Take by mouth.  . olmesartan (BENICAR) 40 MG tablet TAKE 1 TABLET DAILY FOR    BLOOD PRESSURE   No facility-administered encounter medications on file as of 09/09/2023.    Past Medical History:  Diagnosis Date  . Cardiomegaly   . Colon polyp   . Depression   . Fatty liver disease, nonalcoholic   . Gallstones   . Hypertension   . Hypogonadism male   . Mixed hyperlipidemia   . Obesity   . Pancreas (digestive gland) works poorly   . Prediabetes   . Sleep apnea   . Testosterone deficiency 08/03/2009  . Thyroid disease   . Vitamin D deficiency     Past Surgical History:  Procedure Laterality Date  . ANKLE FRACTURE SURGERY Left 1986  . APPENDECTOMY  1964  . CARPAL TUNNEL RELEASE Bilateral 1996  . CHOLECYSTECTOMY  1976  . COLONOSCOPY    . POLYPECTOMY    .  TIBIA IM NAIL INSERTION Right 03/21/2016   Procedure: INTRAMEDULLARY (IM) NAIL TIBIAL;  Surgeon: Saundra Curl, MD;  Location: [facility] OR;  Service: Orthopedics;  Laterality: Right;    Family History  Problem Relation Age of Onset  . Diabetes Mother   . Diabetes Father        brother and sister  . Heart disease Father   . Hypertension Father   . Diabetes Sister   . Colon cancer Neg Hx   . Colon polyps Neg Hx   . Rectal cancer Neg Hx   . Stomach cancer Neg Hx   . Esophageal cancer Neg Hx     Social History   Socioeconomic History  . Marital status: Married    Spouse name: Not on file  . Number of children: 2  . Years of education: Not on file  . Highest education level: Not on file  Occupational History  . Occupation: Armed forces training and education officer: [employer]   Tobacco Use  . Smoking status: Former    Current packs/day: 0.00    Average packs/day: 2.0 packs/day for 7.0 years (14.0 ttl pk-yrs)    Types: Cigarettes    Start date: 05/28/1967    Quit date: 05/27/1974    Years since quitting: 49.3  . Smokeless tobacco: Never  Vaping Use  . Vaping status: Never Used  Substance and Sexual Activity  . Alcohol use: No  . Drug use: No  . Sexual activity: Not on file  Other Topics Concern  . Not on file  Social History Narrative  . Not on file   Social Drivers of Health   Financial Resource Strain: Not on file  Food Insecurity: No Food Insecurity (07/04/2021)   Hunger Vital Sign   . Worried About Programme researcher, broadcasting/film/video in the Last Year: Never true   . Ran Out of Food in the Last Year: Never true  Transportation Needs: Not on file  Physical Activity: Not on file  Stress: Not on file  Social Connections: Not on file  Intimate Partner Violence: Not on file    ROS Per HPI      Objective    There were no vitals taken for this visit.  Physical Exam Vitals and nursing note reviewed.  Constitutional:      General: He is not in acute distress.    Appearance: Normal appearance.  HENT:     Head: Normocephalic and atraumatic.     Right Ear: External ear normal.     Left Ear: External ear normal.     Nose: Nose normal.     Mouth/Throat:     Mouth: Mucous membranes are moist.     Pharynx: Oropharynx is clear.  Eyes:     Extraocular Movements: Extraocular movements intact.  Cardiovascular:     Rate and Rhythm: Normal rate and regular rhythm.     Pulses: Normal pulses.     Heart sounds: Normal heart sounds.  Pulmonary:     Effort: Pulmonary effort is normal. No respiratory distress.     Breath sounds: Normal breath sounds. No wheezing, rhonchi or rales.  Musculoskeletal:        General: Normal range of motion.     Cervical back: Normal range of motion.     Right lower leg: No edema.     Left lower leg: No edema.  Lymphadenopathy:      Cervical: No cervical adenopathy.  Skin:    General: Skin is warm and dry.  Neurological:  General: No focal deficit present.     Mental Status: He is alert and oriented to person, place, and time.  Psychiatric:        Mood and Affect: Mood normal.        Behavior: Behavior normal.       Assessment & Plan:   Essential hypertension  Obstructive sleep apnea  Hypothyroidism, unspecified type  Myositis, unspecified myositis type, unspecified site  Vitamin D deficiency  Medication management  Major depression in remission (HCC)  Hyperlipidemia     No follow-ups on file.   Wellington Half, FNP

## 2023-09-09 ENCOUNTER — Other Ambulatory Visit (HOSPITAL_COMMUNITY): Payer: Self-pay

## 2023-09-09 ENCOUNTER — Ambulatory Visit (INDEPENDENT_AMBULATORY_CARE_PROVIDER_SITE_OTHER): Payer: PPO | Admitting: Family Medicine

## 2023-09-09 ENCOUNTER — Encounter: Payer: Self-pay | Admitting: Family Medicine

## 2023-09-09 VITALS — BP 120/68 | HR 61 | Temp 98.1°F | Ht 67.5 in | Wt 215.5 lb

## 2023-09-09 DIAGNOSIS — F325 Major depressive disorder, single episode, in full remission: Secondary | ICD-10-CM | POA: Diagnosis not present

## 2023-09-09 DIAGNOSIS — E782 Mixed hyperlipidemia: Secondary | ICD-10-CM | POA: Diagnosis not present

## 2023-09-09 DIAGNOSIS — I10 Essential (primary) hypertension: Secondary | ICD-10-CM

## 2023-09-09 DIAGNOSIS — G4733 Obstructive sleep apnea (adult) (pediatric): Secondary | ICD-10-CM

## 2023-09-09 DIAGNOSIS — M21372 Foot drop, left foot: Secondary | ICD-10-CM | POA: Diagnosis not present

## 2023-09-09 DIAGNOSIS — Z9189 Other specified personal risk factors, not elsewhere classified: Secondary | ICD-10-CM | POA: Diagnosis not present

## 2023-09-09 DIAGNOSIS — E559 Vitamin D deficiency, unspecified: Secondary | ICD-10-CM

## 2023-09-09 DIAGNOSIS — Z79899 Other long term (current) drug therapy: Secondary | ICD-10-CM

## 2023-09-09 DIAGNOSIS — M609 Myositis, unspecified: Secondary | ICD-10-CM

## 2023-09-09 DIAGNOSIS — E039 Hypothyroidism, unspecified: Secondary | ICD-10-CM

## 2023-09-09 LAB — POC COVID19 BINAXNOW: SARS Coronavirus 2 Ag: NEGATIVE

## 2023-09-09 MED ORDER — NIRMATRELVIR/RITONAVIR (PAXLOVID)TABLET
3.0000 | ORAL_TABLET | Freq: Two times a day (BID) | ORAL | 0 refills | Status: AC
  Filled 2023-09-09: qty 30, 5d supply, fill #0

## 2023-09-09 NOTE — Assessment & Plan Note (Signed)
 Improving with activity, continue to wear the brace as needed

## 2023-09-09 NOTE — Assessment & Plan Note (Signed)
 Continue levothyroxine, will check labs at next visit

## 2023-09-09 NOTE — Assessment & Plan Note (Signed)
 Paxlovid sent to pharmacy in the case that symptoms develop over holiday weekend, wife has COVID

## 2023-09-09 NOTE — Assessment & Plan Note (Signed)
 Continue CPAP.

## 2023-09-09 NOTE — Assessment & Plan Note (Signed)
 Continue current med regimen, hold Zetia if you end up needing the Paxlovid Will check labs at next visit

## 2023-09-09 NOTE — Assessment & Plan Note (Signed)
 Continue current medication regimen. Will check labs at next visit.

## 2023-09-09 NOTE — Assessment & Plan Note (Signed)
 Controlled, continue current med regimen Will check labs at next visit

## 2023-09-09 NOTE — Assessment & Plan Note (Signed)
Stable, continue escitalopram 

## 2023-09-15 ENCOUNTER — Other Ambulatory Visit: Payer: Self-pay | Admitting: Family Medicine

## 2023-09-15 ENCOUNTER — Other Ambulatory Visit: Payer: Self-pay

## 2023-09-15 ENCOUNTER — Other Ambulatory Visit (HOSPITAL_COMMUNITY): Payer: Self-pay

## 2023-09-15 DIAGNOSIS — I10 Essential (primary) hypertension: Secondary | ICD-10-CM

## 2023-09-15 DIAGNOSIS — F325 Major depressive disorder, single episode, in full remission: Secondary | ICD-10-CM

## 2023-09-15 MED ORDER — GEMFIBROZIL 600 MG PO TABS
600.0000 mg | ORAL_TABLET | Freq: Two times a day (BID) | ORAL | 3 refills | Status: AC
  Filled 2023-09-15: qty 180, 90d supply, fill #0
  Filled 2023-12-16: qty 180, 90d supply, fill #1
  Filled 2024-03-21: qty 180, 90d supply, fill #2
  Filled 2024-06-20: qty 180, 90d supply, fill #3

## 2023-09-15 MED ORDER — BUPROPION HCL ER (XL) 150 MG PO TB24
150.0000 mg | ORAL_TABLET | Freq: Every morning | ORAL | 3 refills | Status: AC
  Filled 2023-09-15: qty 90, 90d supply, fill #0
  Filled 2023-12-21: qty 90, 90d supply, fill #1
  Filled 2024-03-21: qty 90, 90d supply, fill #2
  Filled 2024-06-20: qty 90, 90d supply, fill #3

## 2023-09-15 MED ORDER — OLMESARTAN MEDOXOMIL 40 MG PO TABS
40.0000 mg | ORAL_TABLET | Freq: Every day | ORAL | 3 refills | Status: AC
  Filled 2023-09-15: qty 90, 90d supply, fill #0
  Filled 2023-12-14: qty 90, 90d supply, fill #1
  Filled 2024-03-07: qty 90, 90d supply, fill #2
  Filled 2024-06-13: qty 90, 90d supply, fill #3

## 2023-09-15 NOTE — Telephone Encounter (Signed)
 Copied from CRM [id number]. Topic: Clinical - Medication Refill >> Sep 15, 2023  9:19 AM Lotus Round B wrote: Most Recent Primary Care Visit:  Provider: Wellington Half  Department: [facility]  Visit Type: NEW PATIENT  Date: 09/09/2023  Medication: olmesartan  (BENICAR ) 40 MG tablet , buPROPion  (WELLBUTRIN  XL) 150 MG 24 hr tablet , gemfibrozil  (LOPID ) 600 MG tablet     Has the patient contacted their pharmacy? Yes (Agent: If no, request that the patient contact the pharmacy for the refill. If patient does not wish to contact the pharmacy document the reason why and proceed with request.) (Agent: If yes, when and what did the pharmacy advise?)  Is this the correct pharmacy for this prescription? Yes If no, delete pharmacy and type the correct one.  This is the patient's preferred pharmacy:  [facility] [address] [place] [address] Phone: [phone] Fax: [phone]   Has the prescription been filled recently? No  Is the patient out of the medication? No  Has the patient been seen for an appointment in the last year OR does the patient have an upcoming appointment? Yes  Can we respond through MyChart? Yes  Agent: Please be advised that Rx refills may take up to 3 business days. We ask that you follow-up with your pharmacy.

## 2023-09-29 ENCOUNTER — Other Ambulatory Visit: Payer: Self-pay | Admitting: Family Medicine

## 2023-09-29 ENCOUNTER — Other Ambulatory Visit (HOSPITAL_COMMUNITY): Payer: Self-pay

## 2023-09-29 ENCOUNTER — Other Ambulatory Visit: Payer: Self-pay

## 2023-09-29 DIAGNOSIS — E782 Mixed hyperlipidemia: Secondary | ICD-10-CM

## 2023-09-29 MED ORDER — EZETIMIBE 10 MG PO TABS
10.0000 mg | ORAL_TABLET | Freq: Every day | ORAL | 3 refills | Status: AC
Start: 2023-09-29 — End: ?
  Filled 2023-09-29: qty 90, 90d supply, fill #0
  Filled 2024-01-04: qty 90, 90d supply, fill #1
  Filled 2024-03-28: qty 90, 90d supply, fill #2

## 2023-09-29 NOTE — Telephone Encounter (Signed)
 Last Fill: 05/18/23  Last OV: 09/09/23 Next OV: 12/09/23  Routing to provider for review/authorization.

## 2023-09-29 NOTE — Telephone Encounter (Signed)
 Copied from CRM [id number]. Topic: Clinical - Medication Refill >> Sep 29, 2023 12:06 PM Clyde Darling P wrote: Most Recent Primary Care Visit:  Provider: Wellington Half  Department: [facility]  Visit Type: NEW PATIENT  Date: 09/09/2023  Medication: ezetimibe  (ZETIA ) 10 MG tablet  Has the patient contacted their pharmacy? Yes (Agent: If no, request that the patient contact the pharmacy for the refill. If patient does not wish to contact the pharmacy document the reason why and proceed with request.) (Agent: If yes, when and what did the pharmacy advise?)  Is this the correct pharmacy for this prescription? Yes If no, delete pharmacy and type the correct one.  This is the patient's preferred pharmacy:  [facility] [address] [place] [address] Phone: [phone] Fax: [phone]   Has the prescription been filled recently? No  Is the patient out of the medication? No- has enough for this week  Has the patient been seen for an appointment in the last year OR does the patient have an upcoming appointment? Yes  Can we respond through MyChart? Yes  Agent: Please be advised that Rx refills may take up to 3 business days. We ask that you follow-up with your pharmacy.

## 2023-10-01 ENCOUNTER — Other Ambulatory Visit: Payer: Self-pay | Admitting: Family Medicine

## 2023-10-01 ENCOUNTER — Other Ambulatory Visit (HOSPITAL_COMMUNITY): Payer: Self-pay

## 2023-10-01 DIAGNOSIS — E039 Hypothyroidism, unspecified: Secondary | ICD-10-CM

## 2023-10-02 ENCOUNTER — Other Ambulatory Visit (HOSPITAL_COMMUNITY): Payer: Self-pay

## 2023-10-03 ENCOUNTER — Telehealth: Payer: Self-pay | Admitting: Family Medicine

## 2023-10-03 ENCOUNTER — Other Ambulatory Visit (HOSPITAL_COMMUNITY): Payer: Self-pay

## 2023-10-03 DIAGNOSIS — E039 Hypothyroidism, unspecified: Secondary | ICD-10-CM

## 2023-10-04 ENCOUNTER — Other Ambulatory Visit (HOSPITAL_COMMUNITY): Payer: Self-pay

## 2023-10-06 ENCOUNTER — Other Ambulatory Visit: Payer: Self-pay

## 2023-10-06 ENCOUNTER — Other Ambulatory Visit (HOSPITAL_COMMUNITY): Payer: Self-pay

## 2023-10-06 DIAGNOSIS — E039 Hypothyroidism, unspecified: Secondary | ICD-10-CM

## 2023-10-06 MED ORDER — LEVOTHYROXINE SODIUM 50 MCG PO TABS
50.0000 ug | ORAL_TABLET | Freq: Every day | ORAL | 1 refills | Status: DC
  Filled 2023-10-06: qty 60, 60d supply, fill #0
  Filled 2023-11-30: qty 60, 60d supply, fill #1

## 2023-10-06 NOTE — Telephone Encounter (Signed)
 Medication was sent to wrong pool -- lvm for patient, making him aware of refill being sent in today

## 2023-10-06 NOTE — Telephone Encounter (Unsigned)
 Copied from CRM [id number]. Topic: Clinical - Medication Question >> Oct 03, 2023  3:43 PM Trula Gable C wrote: Reason for EAV:[id number] wife called in regarding levothyroxine  (SYNTHROID ) 50 MCG tablet , wanted to know the status, stated they are going out of town tomorrow and will need it by tomorrow

## 2023-11-02 ENCOUNTER — Other Ambulatory Visit: Payer: Self-pay | Admitting: Internal Medicine

## 2023-11-02 DIAGNOSIS — I10 Essential (primary) hypertension: Secondary | ICD-10-CM

## 2023-11-05 ENCOUNTER — Ambulatory Visit: Payer: Medicare HMO | Admitting: Internal Medicine

## 2023-11-06 ENCOUNTER — Other Ambulatory Visit: Payer: Self-pay | Admitting: Internal Medicine

## 2023-11-06 ENCOUNTER — Other Ambulatory Visit (HOSPITAL_COMMUNITY): Payer: Self-pay

## 2023-11-06 DIAGNOSIS — I10 Essential (primary) hypertension: Secondary | ICD-10-CM

## 2023-11-06 NOTE — Telephone Encounter (Unsigned)
 Copied from CRM [id number]. Topic: Clinical - Medication Refill >> Nov 06, 2023  2:12 PM Takeyla J wrote: Medication: hydrochlorothiazide  (HYDRODIURIL ) 25 MG tablet  Has the patient contacted their pharmacy? Yes (Agent: If no, request that the patient contact the pharmacy for the refill. If patient does not wish to contact the pharmacy document the reason why and proceed with request.) (Agent: If yes, when and what did the pharmacy advise?)  This is the patient's preferred pharmacy:  [facility] [address] [place] [address] Phone: [phone] Fax: [phone]  Is this the correct pharmacy for this prescription? Yes If no, delete pharmacy and type the correct one.   Has the prescription been filled recently? No  Is the patient out of the medication? No  Has the patient been seen for an appointment in the last year OR does the patient have an upcoming appointment? Yes  Can we respond through MyChart? Yes  Agent: Please be advised that Rx refills may take up to 3 business days. We ask that you follow-up with your pharmacy.

## 2023-11-09 ENCOUNTER — Other Ambulatory Visit: Payer: Self-pay | Admitting: Family

## 2023-11-09 DIAGNOSIS — I10 Essential (primary) hypertension: Secondary | ICD-10-CM

## 2023-11-09 NOTE — Progress Notes (Unsigned)
 HPI Male former smoker followed for OSA complicated by obesity, HBP NPSG 08/25/95- Severe obstructive sleep apnea, AHI 58/hr. ---------------------------------------------------------------------------   11/11/22- 73 year old male former smoker(14 pkyrs) followed for OSA, complicated by obesity, HTN, hypothyroid, Hyperlipidemia, Sinus bradycardia,  CPAP auto 5-15/ [employer] Download- compliance 100%, AHI 7/ hr Body weight today- 209 lbs Download reviewed.  Loves his CPAP.  We discussed altering his AutoPap range to see if it would impact his residual AHI a bit. Taking Nexium has improved his chronic cough.  He smoked 2 packs/day but says he stopped age 11.  11/10/23- 73 year old male former smoker(14 pkyrs) followed for OSA, complicated by obesity, HTN, hypothyroid, Hyperlipidemia, Sinus bradycardia,  CPAP auto 5-15/ [employer] Download- compliance  Body weight today-     ROS- see HPI + = positive Constitutional:   No-   weight loss, night sweats, fevers, chills, fatigue, lassitude. HEENT:   No-  headaches, difficulty swallowing, tooth/dental problems, sore throat,       No-  sneezing, itching, ear ache, nasal congestion, post nasal drip,  CV:  No-   chest pain, orthopnea, PND, swelling in lower extremities, anasarca, dizziness, palpitations Resp: No-   shortness of breath with exertion or at rest.              No-   productive cough,  + non-productive cough,  No- coughing up of blood.              No-   change in color of mucus.  No- wheezing.   Skin: No-   rash or lesions. GI:  No-   heartburn, indigestion, abdominal pain, nausea, vomiting,  GU: . MS:  No-   joint pain or swelling.  Neuro-     nothing unusual Psych:  No- change in mood or affect. No depression or anxiety.  No memory loss.  OBJ General- Alert, Oriented, Affect-appropriate, Distress- none acute; +obesity. + Full beard Skin- rash-none, lesions- none, excoriation- none Lymphadenopathy- none Head- atraumatic             Eyes- Gross vision intact, PERRLA, conjunctivae clear secretions            Ears- Hearing, canals-normal            Nose- Clear, no-Septal dev, mucus, polyps, erosion, perforation             Throat- Mallampati III-IV , mucosa , drainage- none, tonsils- atrophic Neck- flexible , trachea midline, no stridor , thyroid  nl, carotid no bruit Chest - symmetrical excursion , unlabored           Heart/CV- RRR , no murmur , no gallop  , no rub, nl s1 s2                           - JVD- none , edema- none, stasis changes- none, varices- none           Lung- clear to P&A, wheeze- none, cough- none , dullness-none, rub- none           Chest wall-  Abd-  Br/ Gen/ Rectal- Not done, not indicated Extrem- cyanosis- none, clubbing, none, atrophy- none, strength- nl Neuro-+left drop-foot

## 2023-11-11 ENCOUNTER — Encounter: Payer: Self-pay | Admitting: Internal Medicine

## 2023-11-11 ENCOUNTER — Telehealth: Payer: Self-pay

## 2023-11-11 ENCOUNTER — Other Ambulatory Visit: Payer: Self-pay

## 2023-11-11 ENCOUNTER — Other Ambulatory Visit (HOSPITAL_COMMUNITY): Payer: Self-pay

## 2023-11-11 ENCOUNTER — Ambulatory Visit: Payer: Medicare HMO | Admitting: Internal Medicine

## 2023-11-11 VITALS — BP 115/72 | HR 62 | Temp 98.3°F | Resp 18 | Ht 68.0 in | Wt 213.8 lb

## 2023-11-11 DIAGNOSIS — F325 Major depressive disorder, single episode, in full remission: Secondary | ICD-10-CM

## 2023-11-11 DIAGNOSIS — Z9989 Dependence on other enabling machines and devices: Secondary | ICD-10-CM | POA: Diagnosis not present

## 2023-11-11 DIAGNOSIS — G4733 Obstructive sleep apnea (adult) (pediatric): Secondary | ICD-10-CM

## 2023-11-11 DIAGNOSIS — I10 Essential (primary) hypertension: Secondary | ICD-10-CM

## 2023-11-11 DIAGNOSIS — R059 Cough, unspecified: Secondary | ICD-10-CM | POA: Diagnosis not present

## 2023-11-11 DIAGNOSIS — Z87891 Personal history of nicotine dependence: Secondary | ICD-10-CM

## 2023-11-11 MED ORDER — ESCITALOPRAM OXALATE 20 MG PO TABS
20.0000 mg | ORAL_TABLET | Freq: Every day | ORAL | 0 refills | Status: DC
  Filled 2023-11-11 – 2024-01-04 (×2): qty 90, 90d supply, fill #0

## 2023-11-11 MED ORDER — METOPROLOL TARTRATE 25 MG PO TABS
ORAL_TABLET | ORAL | 1 refills | Status: DC
  Filled 2023-11-11: qty 180, 90d supply, fill #0
  Filled 2024-02-04: qty 180, 90d supply, fill #1

## 2023-11-11 MED ORDER — HYDROCHLOROTHIAZIDE 25 MG PO TABS
25.0000 mg | ORAL_TABLET | Freq: Every morning | ORAL | 0 refills | Status: DC
Start: 2023-11-11 — End: 2024-02-04
  Filled 2023-11-11: qty 90, 90d supply, fill #0

## 2023-11-11 NOTE — Telephone Encounter (Signed)
 This RN called patient to let spouse know that Hydrodiurul was received by pharmacy on June 10th.   Patient's wife would like Metoprolol  Tartrate refill sent through asap so she can get medications together.      Copied from CRM [id number]. Topic: Clinical - Medication Refill >> Nov 11, 2023 10:12 AM Howard Macho wrote: Patient wife called stating she is checking on a medication refill. The pharmacy does not have it  CB [phone]

## 2023-11-11 NOTE — Telephone Encounter (Signed)
 Copied from CRM [id number]. Topic: Clinical - Prescription Issue >> Nov 11, 2023 10:26 AM Turkey A wrote: Reason for CRM: Patient's wife wants to speak with nurse regarding fax not being sent over -what is being said by pharmacy

## 2023-11-11 NOTE — Telephone Encounter (Signed)
 sent

## 2023-11-11 NOTE — Patient Instructions (Signed)
 You are doing great- we can continue CPAP 5-20  Please call if we can help

## 2023-11-11 NOTE — Telephone Encounter (Signed)
 Spoke with patients wife, called and confirmed with  that prescriptions were received. She was told medication was sent in on 06/10, that is not true. Hydrochlorothiazide  was sent in on 11/04/2022, and was resent today 11/11/2023.

## 2023-11-25 ENCOUNTER — Ambulatory Visit (INDEPENDENT_AMBULATORY_CARE_PROVIDER_SITE_OTHER)

## 2023-11-25 VITALS — Ht 67.5 in | Wt 213.0 lb

## 2023-11-25 DIAGNOSIS — Z0000 Encounter for general adult medical examination without abnormal findings: Secondary | ICD-10-CM | POA: Diagnosis not present

## 2023-11-25 NOTE — Patient Instructions (Signed)
 [name] , Thank you for taking time out of your busy schedule to complete your Annual Wellness Visit with me. I enjoyed our conversation and look forward to speaking with you again next year. I, as well as your care team,  appreciate your ongoing commitment to your health goals. Please review the following plan we discussed and let me know if I can assist you in the future. Your Game plan/ To Do List   Follow up Visits: Next Medicare AWV with our clinical staff: 11/25/2024.   Have you seen your provider in the last 6 months (3 months if uncontrolled diabetes)? Yes Next Office Visit with your provider: 12/09/2023.  Clinician Recommendations:  Aim for 30 minutes of exercise or brisk walking, 6-8 glasses of water, and 5 servings of fruits and vegetables each day. Keep up the good work.      This is a list of the screening recommended for you and due dates:  Health Maintenance  Topic Date Due   COVID-19 Vaccine (8 - 2024-25 season) 04/28/2023   Flu Shot  12/26/2023   Medicare Annual Wellness Visit  11/24/2024   Colon Cancer Screening  05/23/2027   DTaP/Tdap/Td vaccine (3 - Td or Tdap) 04/06/2029   Pneumococcal Vaccine for age over 95  Completed   Hepatitis C Screening  Completed   Zoster (Shingles) Vaccine  Completed   Hepatitis B Vaccine  Aged Out   HPV Vaccine  Aged Out   Meningitis B Vaccine  Aged Out    Advanced directives: (Declined) Advance directive discussed with you today. Even though you declined this today, please call our office should you change your mind, and we can give you the proper paperwork for you to fill out. Advance Care Planning is important because it:  [x]  Makes sure you receive the medical care that is consistent with your values, goals, and preferences  [x]  It provides guidance to your family and loved ones and reduces their decisional burden about whether or not they are making the right decisions based on your wishes.  Follow the link provided in your after  visit summary or read over the paperwork we have mailed to you to help you started getting your Advance Directives in place. If you need assistance in completing these, please reach out to us  so that we can help you!  See attachments for Preventive Care and Fall Prevention Tips.

## 2023-11-25 NOTE — Progress Notes (Signed)
 Subjective:   [name] [name] is a 73 y.o. who presents for a Medicare Wellness preventive visit.  As a reminder, Annual Wellness Visits don't include a physical exam, and some assessments may be limited, especially if this visit is performed virtually. We may recommend an in-person follow-up visit with your provider if needed.  Visit Complete: Virtual I connected with  [name] [name] on 11/25/23 by a audio enabled telemedicine application and verified that I am speaking with the correct person using two identifiers.  Patient Location: Home  Provider Location: Home Office  I discussed the limitations of evaluation and management by telemedicine. The patient expressed understanding and agreed to proceed.  Vital Signs: Because this visit was a virtual/telehealth visit, some criteria may be missing or patient reported. Any vitals not documented were not able to be obtained and vitals that have been documented are patient reported.  VideoDeclined- This patient declined Librarian, academic. Therefore the visit was completed with audio only.  Persons Participating in Visit: Patient.  AWV Questionnaire: No: Patient Medicare AWV questionnaire was not completed prior to this visit.  Cardiac Risk Factors include: advanced age (>77men, >51 women);male gender;hypertension;dyslipidemia;Other (see comment);obesity (BMI >30kg/m2), Risk factor comments: OSA, fatty liver     Objective:    Today's Vitals   11/25/23 0815  Weight: 213 lb (96.6 kg)  Height: 5' 7.5 (1.715 m)   Body mass index is 32.87 kg/m.     11/25/2023    8:21 AM 10/16/2022   11:25 AM 08/29/2021   11:54 AM 10/04/2020    6:35 PM 10/04/2020    3:51 PM 08/29/2020   11:55 AM 07/23/2019    9:41 AM  Advanced Directives  Does Patient Have a Medical Advance Directive? No No No No No No No  Would patient like information on creating a medical advance directive?  No - Patient declined No - Patient declined No -  Patient declined  No - Patient declined No - Patient declined    Current Medications (verified) Outpatient Encounter Medications as of 11/25/2023  Medication Sig   Ascorbic Acid  (VITAMIN C ) 1000 MG tablet Take 1,000 mg by mouth daily.   aspirin  EC 81 MG tablet Take 81 mg by mouth daily. Swallow whole.   B Complex Vitamins (B COMPLEX PO) Take 1 tablet by mouth 3 (three) times a week. Takes M,W,F in the morning   buPROPion  (WELLBUTRIN  XL) 150 MG 24 hr tablet TAKE 1 TABLET EVERY MORNING FOR MOOD, FOCUS, AND CONCENTRATION   Cetirizine HCl (KLS ALLER-TEC PO) Take by mouth.   Cholecalciferol (VITAMIN D ) 50 MCG (2000 UT) tablet Take 6,000 Units by mouth at bedtime.   EPINEPHrine  (EPIPEN  2-PAK) 0.3 mg/0.3 mL IJ SOAJ injection Inject 0.3 mLs (0.3 mg total) into the muscle once.   escitalopram  (LEXAPRO ) 20 MG tablet Take 1 tablet (20 mg total) by mouth daily.   Esomeprazole Magnesium 20 MG TBEC Take 1 tablet by mouth daily. At HS   ezetimibe  (ZETIA ) 10 MG tablet Take 1 tablet (10 mg total) by mouth daily. for cholesterol   Flaxseed, Linseed, (FLAX SEED OIL) 1000 MG CAPS Take 1,000 mg by mouth in the morning and at bedtime.   gemfibrozil  (LOPID ) 600 MG tablet Take 1 tablet (600 mg total) by mouth 2 (two) times daily for cholesterol and triglycerides.   hydrochlorothiazide  (HYDRODIURIL ) 25 MG tablet Take 1 tablet (25 mg total) by mouth in the morning for blood pressure goal <130/80.   LACTOBACILLUS PROBIOTIC PO Take  1 capsule by mouth in the morning.   levothyroxine  (SYNTHROID ) 50 MCG tablet Take 1 tablet (50 mcg total) by mouth daily ON AN EMPTY STOMACH WITH ONLY WATER FOR 30 MINUTES AND NO ANTACID MEDICATIONS, CALCIUM  OR MAGNESIUM FOR 4 HOURS AND AVOID BIOTIN   MAGNESIUM PO Take 750 mg by mouth daily.    metoprolol  tartrate (LOPRESSOR ) 25 MG tablet TAKE 1 TABLET TWICE A DAY  FOR BLOOD PRESSURE (EVERY  12 HOURS)   Multiple Vitamin (MULTIVITAMIN) capsule Take by mouth.   olmesartan  (BENICAR ) 40 MG tablet  Take 1 tablet (40 mg total) by mouth daily for blood pressure.   No facility-administered encounter medications on file as of 11/25/2023.    Allergies (verified) Ace inhibitors and Bee venom   History: Past Medical History:  Diagnosis Date   Cardiomegaly    Colon polyp    Depression    Fatty liver disease, nonalcoholic    Gallstones    Hypertension    Hypogonadism male    Mixed hyperlipidemia    Obesity    Pancreas (digestive gland) works poorly    Prediabetes    Sleep apnea    Testosterone  deficiency 08/03/2009   Thyroid  disease    Vitamin D  deficiency    Past Surgical History:  Procedure Laterality Date   ANKLE FRACTURE SURGERY Left 1986   APPENDECTOMY  1964   CARPAL TUNNEL RELEASE Bilateral 1996   CHOLECYSTECTOMY  1976   COLONOSCOPY     POLYPECTOMY     TIBIA IM NAIL INSERTION Right 03/21/2016   Procedure: INTRAMEDULLARY (IM) NAIL TIBIAL;  Surgeon: Evalene JONETTA Chancy, MD;  Location: [facility] OR;  Service: Orthopedics;  Laterality: Right;   Family History  Problem Relation Age of Onset   Diabetes Mother    Diabetes Father        brother and sister   Heart disease Father    Hypertension Father    Diabetes Sister    Colon cancer Neg Hx    Colon polyps Neg Hx    Rectal cancer Neg Hx    Stomach cancer Neg Hx    Esophageal cancer Neg Hx    Social History   Socioeconomic History   Marital status: Married    Spouse name: Tilton   Number of children: 2   Years of education: Not on file   Highest education level: Not on file  Occupational History   Occupation: RETIRED/[employer]    Employer: [employer]  Tobacco Use   Smoking status: Former    Current packs/day: 0.00    Average packs/day: 2.0 packs/day for 7.0 years (14.0 ttl pk-yrs)    Types: Cigarettes    Start date: 05/28/1967    Quit date: 05/27/1974    Years since quitting: 49.5   Smokeless tobacco: Never  Vaping Use   Vaping status: Never Used  Substance and Sexual Activity   Alcohol use: No   Drug use: No    Sexual activity: Not on file  Other Topics Concern   Not on file  Social History Narrative   Lives with wife/2025   Social Drivers of Health   Financial Resource Strain: Low Risk  (11/25/2023)   Overall Financial Resource Strain (CARDIA)    Difficulty of Paying Living Expenses: Not hard at all  Food Insecurity: No Food Insecurity (11/25/2023)   Hunger Vital Sign    Worried About Running Out of Food in the Last Year: Never true    Ran Out of Food in the Last Year: Never  true  Transportation Needs: No Transportation Needs (11/25/2023)   PRAPARE - Administrator, Civil Service (Medical): No    Lack of Transportation (Non-Medical): No  Physical Activity: Inactive (11/25/2023)   Exercise Vital Sign    Days of Exercise per Week: 0 days    Minutes of Exercise per Session: 0 min  Stress: No Stress Concern Present (11/25/2023)   [employer] of Occupational Health - Occupational Stress Questionnaire    Feeling of Stress: Not at all  Social Connections: Socially Isolated (11/25/2023)   Social Connection and Isolation Panel    Frequency of Communication with Friends and Family: Once a week    Frequency of Social Gatherings with Friends and Family: Once a week    Attends Religious Services: Never    Database administrator or Organizations: No    Attends Engineer, structural: Never    Marital Status: Married    Tobacco Counseling Counseling given: Not Answered    Clinical Intake:  Pre-visit preparation completed: Yes  Pain : No/denies pain     BMI - recorded: 32.87 Nutritional Status: BMI > 30  Obese Nutritional Risks: None  Lab Results  Component Value Date   HGBA1C 6.2 (H) 04/11/2023   HGBA1C 6.0 (H) 10/16/2022   HGBA1C 6.2 (H) 04/10/2022     How often do you need to have someone help you when you read instructions, pamphlets, or other written materials from your doctor or pharmacy?: 1 - Never  Interpreter Needed?: No  Information entered by ::  Jamisha Hoeschen, RMA   Activities of Daily Living     11/25/2023    8:18 AM  In your present state of health, do you have any difficulty performing the following activities:  Hearing? 0  Vision? 0  Difficulty concentrating or making decisions? 0  Walking or climbing stairs? 0  Dressing or bathing? 0  Doing errands, shopping? 0  Preparing Food and eating ? N  Using the Toilet? N  In the past six months, have you accidently leaked urine? N  Do you have problems with loss of bowel control? N  Managing your Medications? N  Managing your Finances? N  Housekeeping or managing your Housekeeping? N    Patient Care Team: Alvia Corean CROME, FNP as PCP - General (Family Medicine) Abran Norleen SAILOR, MD as Consulting Physician (Gastroenterology) Quinn Odor, [facility] (Inactive) as Pharmacist (Pharmacist) Abigail, Maude POUR [facility])  I have updated your Care Teams any recent Medical Services you may have received from other providers in the past year.     Assessment:   This is a routine wellness examination for [name] [name].  Hearing/Vision screen Hearing Screening - Comments:: Denies hearing difficulties   Vision Screening - Comments:: Wears eyeglasses/   Goals Addressed   None    Depression Screen     11/25/2023    8:25 AM 09/09/2023   10:18 AM 10/16/2022   11:26 AM 08/29/2021   12:02 PM 12/10/2020    8:21 PM 08/29/2020   12:20 PM 08/29/2020   12:04 PM  PHQ 2/9 Scores  PHQ - 2 Score 0 2 0 0 0 1 1  PHQ- 9 Score 1 6    1      Fall Risk     11/25/2023    8:22 AM 09/09/2023   10:18 AM 10/16/2022   11:24 AM 08/29/2021   12:01 PM 10/23/2020    6:35 PM  Fall Risk   Falls in the past year? 1 1  1 1 1   Number falls in past yr: 0 1 1 1  0  Injury with Fall? 0 0 0 1 1  Risk for fall due to : Impaired balance/gait No Fall Risks Impaired balance/gait;History of fall(s) Orthopedic patient;Impaired balance/gait;History of fall(s) No Fall Risks  Follow up Falls evaluation completed;Falls prevention discussed  Falls evaluation completed Falls evaluation completed;Falls prevention discussed Falls evaluation completed;Education provided;Falls prevention discussed  Falls evaluation completed;Education provided;Falls prevention discussed      Data saved with a previous flowsheet row definition    MEDICARE RISK AT HOME:  Medicare Risk at Home Any stairs in or around the home?: Yes (3 into house) If so, are there any without handrails?: Yes Home free of loose throw rugs in walkways, pet beds, electrical cords, etc?: Yes Adequate lighting in your home to reduce risk of falls?: Yes Life alert?: No Use of a cane, walker or w/c?: Yes (cane) Grab bars in the bathroom?: No Shower chair or bench in shower?: Yes Elevated toilet seat or a handicapped toilet?: No  TIMED UP AND GO:  Was the test performed?  No  Cognitive Function: Declined/Normal: No cognitive concerns noted by patient or family. Patient alert, oriented, able to answer questions appropriately and recall recent events. No signs of memory loss or confusion.        Immunizations Immunization History  Administered Date(s) Administered   Fluad Quad(high Dose 65+) 02/28/2022   Influenza Split 03/06/2012, 02/24/2013, 03/18/2014   Influenza, High Dose Seasonal PF 02/12/2016, 04/09/2017, 02/15/2019, 03/21/2020, 02/13/2021, 03/03/2023   Influenza-Unspecified 03/03/2015, 02/04/2018   PFIZER Comirnaty(Gray Top)Covid-19 Tri-Sucrose Vaccine 10/24/2020, 03/03/2023   PFIZER(Purple Top)SARS-COV-2 Vaccination 07/08/2019, 08/12/2019, 03/14/2020   PPD Test 07/26/2013   Pfizer Covid-19 Vaccine Bivalent Booster 30yrs & up 02/27/2021   Pneumococcal Conjugate-13 02/12/2016   Pneumococcal Polysaccharide-23 07/26/2013, 01/26/2019   Pneumococcal-Unspecified 05/04/2004   Respiratory Syncytial Virus Vaccine,Recomb Aduvanted(Arexvy) 04/15/2022   Td 04/07/2019   Tdap 07/24/2011   Unspecified SARS-COV-2 Vaccination 02/28/2022   Zoster Recombinant(Shingrix)  01/28/2019, 04/20/2019   Zoster, Live 12/02/2013    Screening Tests Health Maintenance  Topic Date Due   COVID-19 Vaccine (8 - 2024-25 season) 04/28/2023   INFLUENZA VACCINE  12/26/2023   Medicare Annual Wellness (AWV)  11/24/2024   Colonoscopy  05/23/2027   DTaP/Tdap/Td (3 - Td or Tdap) 04/06/2029   Pneumococcal Vaccine: 50+ Years  Completed   Hepatitis C Screening  Completed   Zoster Vaccines- Shingrix  Completed   Hepatitis B Vaccines  Aged Out   HPV VACCINES  Aged Out   Meningococcal B Vaccine  Aged Out    Health Maintenance  Health Maintenance Due  Topic Date Due   COVID-19 Vaccine (8 - 2024-25 season) 04/28/2023   Health Maintenance Items Addressed: See Nurse Notes at the end of this note  Additional Screening:  Vision Screening: Recommended annual ophthalmology exams for early detection of glaucoma and other disorders of the eye. Would you like a referral to an eye doctor? No    Dental Screening: Recommended annual dental exams for proper oral hygiene  Community Resource Referral / Chronic Care Management: CRR required this visit?  No   CCM required this visit?  No   Plan:    I have personally reviewed and noted the following in the patient's chart:   Medical and social history Use of alcohol, tobacco or illicit drugs  Current medications and supplements including opioid prescriptions. Patient is not currently taking opioid prescriptions. Functional ability and status Nutritional status Physical activity  Advanced directives List of other physicians Hospitalizations, surgeries, and ER visits in previous 12 months Vitals Screenings to include cognitive, depression, and falls Referrals and appointments  In addition, I have reviewed and discussed with patient certain preventive protocols, quality metrics, and best practice recommendations. A written personalized care plan for preventive services as well as general preventive health recommendations were  provided to patient.   Teola Felipe L Noela Brothers, CMA   11/25/2023   After Visit Summary: (MyChart) Due to this being a telephonic visit, the after visit summary with patients personalized plan was offered to patient via MyChart   Notes: Nothing significant to report at this time.

## 2023-12-01 ENCOUNTER — Other Ambulatory Visit (HOSPITAL_COMMUNITY): Payer: Self-pay

## 2023-12-09 ENCOUNTER — Ambulatory Visit (INDEPENDENT_AMBULATORY_CARE_PROVIDER_SITE_OTHER): Admitting: Family Medicine

## 2023-12-09 ENCOUNTER — Other Ambulatory Visit (HOSPITAL_COMMUNITY): Payer: Self-pay

## 2023-12-09 ENCOUNTER — Other Ambulatory Visit: Payer: Self-pay

## 2023-12-09 VITALS — BP 110/70 | HR 57 | Temp 97.8°F | Ht 67.5 in | Wt 214.4 lb

## 2023-12-09 DIAGNOSIS — I10 Essential (primary) hypertension: Secondary | ICD-10-CM | POA: Diagnosis not present

## 2023-12-09 DIAGNOSIS — R7309 Other abnormal glucose: Secondary | ICD-10-CM | POA: Diagnosis not present

## 2023-12-09 DIAGNOSIS — E039 Hypothyroidism, unspecified: Secondary | ICD-10-CM

## 2023-12-09 DIAGNOSIS — F325 Major depressive disorder, single episode, in full remission: Secondary | ICD-10-CM

## 2023-12-09 DIAGNOSIS — M609 Myositis, unspecified: Secondary | ICD-10-CM | POA: Diagnosis not present

## 2023-12-09 DIAGNOSIS — G4733 Obstructive sleep apnea (adult) (pediatric): Secondary | ICD-10-CM

## 2023-12-09 DIAGNOSIS — R21 Rash and other nonspecific skin eruption: Secondary | ICD-10-CM | POA: Diagnosis not present

## 2023-12-09 DIAGNOSIS — E782 Mixed hyperlipidemia: Secondary | ICD-10-CM

## 2023-12-09 DIAGNOSIS — E559 Vitamin D deficiency, unspecified: Secondary | ICD-10-CM | POA: Diagnosis not present

## 2023-12-09 DIAGNOSIS — M21372 Foot drop, left foot: Secondary | ICD-10-CM

## 2023-12-09 DIAGNOSIS — Z79899 Other long term (current) drug therapy: Secondary | ICD-10-CM | POA: Diagnosis not present

## 2023-12-09 LAB — MICROALBUMIN / CREATININE URINE RATIO
Creatinine,U: 112.8 mg/dL
Microalb Creat Ratio: 11.3 mg/g (ref 0.0–30.0)
Microalb, Ur: 1.3 mg/dL (ref 0.0–1.9)

## 2023-12-09 LAB — CBC WITH DIFFERENTIAL/PLATELET
Basophils Absolute: 0.1 K/uL (ref 0.0–0.1)
Basophils Relative: 1.5 % (ref 0.0–3.0)
Eosinophils Absolute: 0.2 K/uL (ref 0.0–0.7)
Eosinophils Relative: 6.3 % — ABNORMAL HIGH (ref 0.0–5.0)
HCT: 39.4 % (ref 39.0–52.0)
Hemoglobin: 13 g/dL (ref 13.0–17.0)
Lymphocytes Relative: 30.4 % (ref 12.0–46.0)
Lymphs Abs: 1.2 K/uL (ref 0.7–4.0)
MCHC: 32.9 g/dL (ref 30.0–36.0)
MCV: 87.5 fl (ref 78.0–100.0)
Monocytes Absolute: 0.6 K/uL (ref 0.1–1.0)
Monocytes Relative: 16.4 % — ABNORMAL HIGH (ref 3.0–12.0)
Neutro Abs: 1.7 K/uL (ref 1.4–7.7)
Neutrophils Relative %: 45.4 % (ref 43.0–77.0)
Platelets: 244 K/uL (ref 150.0–400.0)
RBC: 4.51 Mil/uL (ref 4.22–5.81)
RDW: 14.7 % (ref 11.5–15.5)
WBC: 3.8 K/uL — ABNORMAL LOW (ref 4.0–10.5)

## 2023-12-09 LAB — VITAMIN D 25 HYDROXY (VIT D DEFICIENCY, FRACTURES): VITD: 89.65 ng/mL (ref 30.00–100.00)

## 2023-12-09 LAB — LIPID PANEL
Cholesterol: 161 mg/dL (ref 0–200)
HDL: 32.6 mg/dL — ABNORMAL LOW (ref >39.00)
LDL Cholesterol: 76 mg/dL (ref 0–99)
NonHDL: 128.14
Total CHOL/HDL Ratio: 5
Triglycerides: 263 mg/dL — ABNORMAL HIGH (ref 0.0–149.0)
VLDL: 52.6 mg/dL — ABNORMAL HIGH (ref 0.0–40.0)

## 2023-12-09 LAB — COMPREHENSIVE METABOLIC PANEL WITH GFR
ALT: 40 U/L (ref 0–53)
AST: 51 U/L — ABNORMAL HIGH (ref 0–37)
Albumin: 4.6 g/dL (ref 3.5–5.2)
Alkaline Phosphatase: 45 U/L (ref 39–117)
BUN: 23 mg/dL (ref 6–23)
CO2: 30 meq/L (ref 19–32)
Calcium: 10.1 mg/dL (ref 8.4–10.5)
Chloride: 103 meq/L (ref 96–112)
Creatinine, Ser: 1.09 mg/dL (ref 0.40–1.50)
GFR: 67.64 mL/min (ref >60.00)
Glucose, Bld: 109 mg/dL — ABNORMAL HIGH (ref 70–99)
Potassium: 4.4 meq/L (ref 3.5–5.1)
Sodium: 139 meq/L (ref 135–145)
Total Bilirubin: 0.4 mg/dL (ref 0.2–1.2)
Total Protein: 7.3 g/dL (ref 6.0–8.3)

## 2023-12-09 LAB — TSH: TSH: 1.44 u[IU]/mL (ref 0.35–5.50)

## 2023-12-09 LAB — HEMOGLOBIN A1C: Hgb A1c MFr Bld: 6.4 % (ref 4.6–6.5)

## 2023-12-09 MED ORDER — TRIAMCINOLONE ACETONIDE 0.1 % EX CREA
1.0000 | TOPICAL_CREAM | Freq: Two times a day (BID) | CUTANEOUS | 0 refills | Status: AC
  Filled 2023-12-09: qty 45, 23d supply, fill #0

## 2023-12-09 NOTE — Patient Instructions (Addendum)
 We are checking labs today, will be in contact with any results that require further attention  Continue current medication regimen  Follow up in 3 mos for meds and labs, 6 mos for physical

## 2023-12-09 NOTE — Progress Notes (Signed)
 Established Patient Office Visit  Subjective:     Patient ID: [name] [name], male    DOB: 06/12/1950, 73 y.o.   MRN: [id number]  Chief Complaint  Patient presents with   Follow-up    HPI  Discussed the use of AI scribe software for clinical note transcription with the patient, who gave verbal consent to proceed.  History of Present Illness [name] [name] is a 73 year old male who presents with a rash and itching on his legs and torso.  Pruritic rash - Pruritic rash primarily on the legs, with additional lesions on the back and groin area - Onset unclear; possible exposure to tall grass or animals considered as etiology - No similar symptoms in his daughter's dog - Significant discomfort due to persistent itching - Uses anti-itch cream and [place] for symptomatic relief  Hyperglycemia - Diabetes mellitus with elevated blood glucose levels - Uses Splenda to reduce sugar intake - Adjusts black cherry Kool-Aid recipe to manage sugar consumption  Thyroid  dysfunction - Treated for thyroid  dysfunction due to previously elevated thyroid  levels - Currently taking thyroid  medication     ROS Per HPI      Objective:    BP 110/70 (BP Location: Left Arm, Patient Position: Sitting)   Pulse (!) 57   Temp 97.8 F (36.6 C) (Temporal)   Ht 5' 7.5 (1.715 m)   Wt 214 lb 6.4 oz (97.3 kg)   SpO2 96%   BMI 33.08 kg/m    Physical Exam Vitals and nursing note reviewed.  Constitutional:      General: He is not in acute distress.    Appearance: Normal appearance.  HENT:     Head: Normocephalic and atraumatic.     Right Ear: External ear normal.     Left Ear: External ear normal.     Nose: Nose normal.     Mouth/Throat:     Mouth: Mucous membranes are moist.     Pharynx: Oropharynx is clear.  Eyes:     Extraocular Movements: Extraocular movements intact.  Neck:     Vascular: No carotid bruit.  Cardiovascular:     Rate and Rhythm: Normal rate and regular rhythm.      Pulses: Normal pulses.     Heart sounds: Normal heart sounds.  Pulmonary:     Effort: Pulmonary effort is normal. No respiratory distress.     Breath sounds: Normal breath sounds. No wheezing, rhonchi or rales.  Musculoskeletal:        General: Normal range of motion.     Cervical back: Normal range of motion.     Right lower leg: No edema.     Left lower leg: No edema.  Lymphadenopathy:     Cervical: No cervical adenopathy.  Skin:    General: Skin is warm and dry.     Findings: Rash present.     Comments: Erythematous papular rash disseminated throughout upper thighs and groin.  No confluence, no bleeding, no discharge  Neurological:     General: No focal deficit present.     Mental Status: He is alert and oriented to person, place, and time.  Psychiatric:        Mood and Affect: Mood normal.        Behavior: Behavior normal.     No results found for any visits on 12/09/23.  The 10-year ASCVD risk score (Arnett DK, et al., 2019) is: 20.1%  BP Readings from Last 3 Encounters:  12/09/23 110/70  11/11/23  115/72  09/09/23 120/68   Wt Readings from Last 3 Encounters:  12/09/23 214 lb 6.4 oz (97.3 kg)  11/25/23 213 lb (96.6 kg)  11/11/23 213 lb 12.8 oz (97 kg)   SpO2 Readings from Last 3 Encounters:  12/09/23 96%  11/11/23 96%  09/09/23 97%      Last CBC Lab Results  Component Value Date   WBC 4.1 04/11/2023   HGB 14.2 04/11/2023   HCT 42.5 04/11/2023   MCV 93.6 04/11/2023   MCH 31.3 04/11/2023   RDW 13.0 04/11/2023   PLT 182 04/11/2023   Last metabolic panel Lab Results  Component Value Date   GLUCOSE 97 04/11/2023   NA 142 04/11/2023   K 4.7 04/11/2023   CL 104 04/11/2023   CO2 31 04/11/2023   BUN 24 04/11/2023   CREATININE 1.11 04/11/2023   EGFR 71 04/11/2023   CALCIUM  10.3 04/11/2023   PHOS 3.3 07/31/2010   PROT 7.0 04/11/2023   ALBUMIN 4.9 01/01/2017   BILITOT 0.3 04/11/2023   ALKPHOS 82 01/01/2017   AST 39 (H) 04/11/2023   ALT 27 04/11/2023    ANIONGAP 8 10/05/2020   Last lipids Lab Results  Component Value Date   CHOL 155 04/11/2023   HDL 31 ([name]) 04/11/2023   LDLCALC 88 04/11/2023   TRIG 272 (H) 04/11/2023   CHOLHDL 5.0 (H) 04/11/2023   Last hemoglobin A1c Lab Results  Component Value Date   HGBA1C 6.2 (H) 04/11/2023   Last thyroid  functions Lab Results  Component Value Date   TSH 1.30 04/11/2023   Last vitamin D  Lab Results  Component Value Date   VD25OH 86 04/11/2023   Last vitamin B12 and Folate Lab Results  Component Value Date   VITAMINB12 737 01/26/2019         Assessment & Plan:   Assessment and Plan Assessment & Plan Pruritic rash Pruritic rash on legs, back, and groin. Etiology unclear, not consistent with scabies. - Prescribe topical medication for itching. - Advise Benadryl  at night for itching. - Instruct to report if symptoms do not improve or worsen.  Hypothyroidism Thyroid  condition stable on current medication. Ongoing monitoring required due to family history. - Recheck thyroid  levels with labs today. - Adjust medication quantity if necessary based on lab results.  Prediabetes Elevated blood glucose levels. Dietary adjustments in place to manage glucose levels. Continued monitoring and modifications needed to prevent diabetes. - Continue dietary modifications to reduce sugar intake.  General Health Maintenance Due for routine labs and medication refills. - Perform routine lab tests today. - Schedule follow-up appointment in three months for medication and lab review. - Vitamin D  levels today - Continue CPAP for OSA - Continue efforts in healthy diet and exercise  MDD - Stable   Orders Placed This Encounter  Procedures   CBC with Differential/Platelet    Release to patient:   Immediate [1]   Comprehensive metabolic panel with GFR    Release to patient:   Immediate [1]   Hemoglobin A1c   Lipid panel   VITAMIN D  25 Hydroxy (Vit-D Deficiency, Fractures)   TSH    Microalbumin / creatinine urine ratio    Release to patient:   Immediate     Meds ordered this encounter  Medications   triamcinolone  cream (KENALOG ) 0.1 %    Sig: Apply 1 Application topically 2 (two) times daily.    Dispense:  45 g    Refill:  0    Return for 3 months for  meds and labs, 6 months for a physical and AWV.  [name] LITTIE Ku, FNP

## 2023-12-11 ENCOUNTER — Ambulatory Visit: Payer: Self-pay | Admitting: Family Medicine

## 2023-12-11 DIAGNOSIS — K760 Fatty (change of) liver, not elsewhere classified: Secondary | ICD-10-CM

## 2023-12-15 ENCOUNTER — Other Ambulatory Visit (HOSPITAL_COMMUNITY): Payer: Self-pay

## 2023-12-17 ENCOUNTER — Other Ambulatory Visit (HOSPITAL_COMMUNITY): Payer: Self-pay

## 2023-12-22 ENCOUNTER — Other Ambulatory Visit: Payer: Self-pay

## 2023-12-23 DIAGNOSIS — L814 Other melanin hyperpigmentation: Secondary | ICD-10-CM | POA: Diagnosis not present

## 2023-12-23 DIAGNOSIS — L570 Actinic keratosis: Secondary | ICD-10-CM | POA: Diagnosis not present

## 2023-12-23 DIAGNOSIS — D225 Melanocytic nevi of trunk: Secondary | ICD-10-CM | POA: Diagnosis not present

## 2023-12-23 DIAGNOSIS — S90561A Insect bite (nonvenomous), right ankle, initial encounter: Secondary | ICD-10-CM | POA: Diagnosis not present

## 2023-12-23 DIAGNOSIS — L821 Other seborrheic keratosis: Secondary | ICD-10-CM | POA: Diagnosis not present

## 2024-01-05 ENCOUNTER — Other Ambulatory Visit: Payer: Self-pay

## 2024-01-05 ENCOUNTER — Other Ambulatory Visit (HOSPITAL_COMMUNITY): Payer: Self-pay

## 2024-02-04 ENCOUNTER — Other Ambulatory Visit: Payer: Self-pay | Admitting: Family Medicine

## 2024-02-04 DIAGNOSIS — I10 Essential (primary) hypertension: Secondary | ICD-10-CM

## 2024-02-04 DIAGNOSIS — E039 Hypothyroidism, unspecified: Secondary | ICD-10-CM

## 2024-02-05 ENCOUNTER — Other Ambulatory Visit: Payer: Self-pay

## 2024-02-05 ENCOUNTER — Other Ambulatory Visit (HOSPITAL_COMMUNITY): Payer: Self-pay

## 2024-02-05 MED ORDER — LEVOTHYROXINE SODIUM 50 MCG PO TABS
50.0000 ug | ORAL_TABLET | Freq: Every day | ORAL | 1 refills | Status: DC
  Filled 2024-02-05: qty 60, 60d supply, fill #0
  Filled 2024-03-28: qty 60, 60d supply, fill #1

## 2024-02-05 MED ORDER — HYDROCHLOROTHIAZIDE 25 MG PO TABS
25.0000 mg | ORAL_TABLET | Freq: Every morning | ORAL | 0 refills | Status: DC
  Filled 2024-02-05: qty 90, 90d supply, fill #0

## 2024-03-07 ENCOUNTER — Other Ambulatory Visit (HOSPITAL_COMMUNITY): Payer: Self-pay

## 2024-03-08 ENCOUNTER — Other Ambulatory Visit (HOSPITAL_COMMUNITY): Payer: Self-pay

## 2024-03-11 ENCOUNTER — Ambulatory Visit (INDEPENDENT_AMBULATORY_CARE_PROVIDER_SITE_OTHER)

## 2024-03-11 ENCOUNTER — Ambulatory Visit: Payer: Self-pay | Admitting: Family Medicine

## 2024-03-11 ENCOUNTER — Ambulatory Visit: Admitting: Family Medicine

## 2024-03-11 ENCOUNTER — Other Ambulatory Visit (HOSPITAL_COMMUNITY): Payer: Self-pay

## 2024-03-11 VITALS — BP 122/60 | HR 51 | Temp 98.1°F | Ht 67.5 in | Wt 214.8 lb

## 2024-03-11 DIAGNOSIS — N529 Male erectile dysfunction, unspecified: Secondary | ICD-10-CM

## 2024-03-11 DIAGNOSIS — Z23 Encounter for immunization: Secondary | ICD-10-CM | POA: Diagnosis not present

## 2024-03-11 DIAGNOSIS — E559 Vitamin D deficiency, unspecified: Secondary | ICD-10-CM | POA: Diagnosis not present

## 2024-03-11 DIAGNOSIS — I10 Essential (primary) hypertension: Secondary | ICD-10-CM

## 2024-03-11 DIAGNOSIS — M25551 Pain in right hip: Secondary | ICD-10-CM

## 2024-03-11 DIAGNOSIS — F3341 Major depressive disorder, recurrent, in partial remission: Secondary | ICD-10-CM

## 2024-03-11 DIAGNOSIS — E039 Hypothyroidism, unspecified: Secondary | ICD-10-CM | POA: Diagnosis not present

## 2024-03-11 DIAGNOSIS — M47816 Spondylosis without myelopathy or radiculopathy, lumbar region: Secondary | ICD-10-CM | POA: Diagnosis not present

## 2024-03-11 DIAGNOSIS — E782 Mixed hyperlipidemia: Secondary | ICD-10-CM | POA: Diagnosis not present

## 2024-03-11 DIAGNOSIS — Z79899 Other long term (current) drug therapy: Secondary | ICD-10-CM

## 2024-03-11 DIAGNOSIS — G4733 Obstructive sleep apnea (adult) (pediatric): Secondary | ICD-10-CM | POA: Diagnosis not present

## 2024-03-11 DIAGNOSIS — R7309 Other abnormal glucose: Secondary | ICD-10-CM

## 2024-03-11 DIAGNOSIS — G8929 Other chronic pain: Secondary | ICD-10-CM | POA: Diagnosis not present

## 2024-03-11 DIAGNOSIS — M1611 Unilateral primary osteoarthritis, right hip: Secondary | ICD-10-CM | POA: Diagnosis not present

## 2024-03-11 LAB — COMPREHENSIVE METABOLIC PANEL WITH GFR
ALT: 33 U/L (ref 0–53)
AST: 36 U/L (ref 0–37)
Albumin: 4.6 g/dL (ref 3.5–5.2)
Alkaline Phosphatase: 48 U/L (ref 39–117)
BUN: 22 mg/dL (ref 6–23)
CO2: 29 meq/L (ref 19–32)
Calcium: 10.1 mg/dL (ref 8.4–10.5)
Chloride: 102 meq/L (ref 96–112)
Creatinine, Ser: 1.08 mg/dL (ref 0.40–1.50)
GFR: 68.27 mL/min (ref >60.00)
Glucose, Bld: 95 mg/dL (ref 70–99)
Potassium: 4.5 meq/L (ref 3.5–5.1)
Sodium: 141 meq/L (ref 135–145)
Total Bilirubin: 0.4 mg/dL (ref 0.2–1.2)
Total Protein: 7.4 g/dL (ref 6.0–8.3)

## 2024-03-11 LAB — LIPID PANEL
Cholesterol: 158 mg/dL (ref 0–200)
HDL: 33.1 mg/dL — ABNORMAL LOW (ref >39.00)
LDL Cholesterol: 83 mg/dL (ref 0–99)
NonHDL: 124.65
Total CHOL/HDL Ratio: 5
Triglycerides: 208 mg/dL — ABNORMAL HIGH (ref 0.0–149.0)
VLDL: 41.6 mg/dL — ABNORMAL HIGH (ref 0.0–40.0)

## 2024-03-11 LAB — HEMOGLOBIN A1C: Hgb A1c MFr Bld: 6.6 % — ABNORMAL HIGH (ref 4.6–6.5)

## 2024-03-11 LAB — TSH: TSH: 1.89 u[IU]/mL (ref 0.35–5.50)

## 2024-03-11 LAB — VITAMIN D 25 HYDROXY (VIT D DEFICIENCY, FRACTURES): VITD: 92.95 ng/mL (ref 30.00–100.00)

## 2024-03-11 MED ORDER — COVID-19 MRNA VACC (MODERNA) 50 MCG/0.5ML IM SUSP
0.5000 mL | Freq: Once | INTRAMUSCULAR | 0 refills | Status: AC
  Filled 2024-03-11: qty 0.5, 1d supply, fill #0

## 2024-03-11 NOTE — Patient Instructions (Addendum)
 We are getting an xray today. We will be in contact with any abnormal results that require further attention.  We are checking labs today, will be in contact with any results that require further attention.  I have sent in a referral to sports medicine for further evaluation and treatment of your shoulder pain.   We have given your flu vaccine today.   Follow up with me in about 3 months for labs and medication management, sooner if needed.

## 2024-03-11 NOTE — Progress Notes (Signed)
 Established Patient Office Visit  Subjective:     Patient ID: [name] [name], male    DOB: 06/17/1950, 73 y.o.   MRN: [id number]  Chief Complaint  Patient presents with   Medical Management of Chronic Issues    3 month f/u-Rt hip and Rt shoulder pain    HPI  Discussed the use of AI scribe software for clinical note transcription with the patient, who gave verbal consent to proceed.  History of Present Illness [name] [name] is a 73 year old male who presents for medication management and follow-up. He is accompanied by his wife.  Musculoskeletal pain and mobility impairment - Pain in both shoulders, hips, and back - Worsening hip pain, more frequent and intensified with weight-bearing on the affected hip - Difficulty ambulating, especially when walking without a cane - Difficulty ascending steps, leads with left foot and drags right leg - History of rotator cuff injury in the shoulder, no prior injections for this issue  Sleep disturbance and fatigue - Low energy levels - Uses CPAP machine for obstructive sleep apnea - Sleeps approximately six hours per night - Occasionally naps in the afternoon - Achieves dreaming state during sleep     ROS Per HPI      Objective:    BP 122/60 (BP Location: Left Arm, Patient Position: Sitting)   Pulse (!) 51   Temp 98.1 F (36.7 C) (Oral)   Ht 5' 7.5 (1.715 m)   Wt 214 lb 12.8 oz (97.4 kg)   SpO2 97%   BMI 33.15 kg/m    Physical Exam Vitals and nursing note reviewed.  Constitutional:      General: He is not in acute distress.    Appearance: He is obese.  HENT:     Head: Normocephalic and atraumatic.     Right Ear: External ear normal.     Left Ear: External ear normal.     Nose: Nose normal.     Mouth/Throat:     Mouth: Mucous membranes are moist.     Pharynx: Oropharynx is clear.  Eyes:     Extraocular Movements: Extraocular movements intact.  Neck:     Vascular: No carotid bruit.  Cardiovascular:     Rate  and Rhythm: Regular rhythm. Bradycardia present.     Pulses: Normal pulses.     Heart sounds: Normal heart sounds.  Pulmonary:     Effort: Pulmonary effort is normal. No respiratory distress.     Breath sounds: Normal breath sounds. No wheezing, rhonchi or rales.  Musculoskeletal:     Cervical back: Normal range of motion.     Right lower leg: No edema.     Left lower leg: No edema.     Comments: LROM to R hip, bilateral shoulders. No erythema, bruising, obvious deformity  Lymphadenopathy:     Cervical: No cervical adenopathy.  Skin:    General: Skin is warm and dry.  Neurological:     General: No focal deficit present.     Mental Status: He is alert and oriented to person, place, and time.  Psychiatric:        Mood and Affect: Mood normal.        Behavior: Behavior normal.     Results for orders placed or performed in visit on 03/11/24  Comprehensive metabolic panel with GFR  Result Value Ref Range   Sodium 141 135 - 145 mEq/L   Potassium 4.5 3.5 - 5.1 mEq/L   Chloride 102 96 -  112 mEq/L   CO2 29 19 - 32 mEq/L   Glucose, Bld 95 70 - 99 mg/dL   BUN 22 6 - 23 mg/dL   Creatinine, Ser 8.91 0.40 - 1.50 mg/dL   Total Bilirubin 0.4 0.2 - 1.2 mg/dL   Alkaline Phosphatase 48 39 - 117 U/L   AST 36 0 - 37 U/L   ALT 33 0 - 53 U/L   Total Protein 7.4 6.0 - 8.3 g/dL   Albumin 4.6 3.5 - 5.2 g/dL   GFR 31.72 >39.99 mL/min   Calcium  10.1 8.4 - 10.5 mg/dL  Hemoglobin J8r  Result Value Ref Range   Hgb A1c MFr Bld 6.6 (H) 4.6 - 6.5 %  Lipid panel  Result Value Ref Range   Cholesterol 158 0 - 200 mg/dL   Triglycerides 791.9 (H) 0.0 - 149.0 mg/dL   HDL 66.89 (L) >60.99 mg/dL   VLDL 58.3 (H) 0.0 - 59.9 mg/dL   LDL Cholesterol 83 0 - 99 mg/dL   Total CHOL/HDL Ratio 5    NonHDL 124.65   TSH  Result Value Ref Range   TSH 1.89 0.35 - 5.50 uIU/mL  VITAMIN D  25 Hydroxy (Vit-D Deficiency, Fractures)  Result Value Ref Range   VITD 92.95 30.00 - 100.00 ng/mL    The 10-year ASCVD risk  score (Arnett DK, et al., 2019) is: 24.8%  BP Readings from Last 3 Encounters:  03/11/24 122/60  12/09/23 110/70  11/11/23 115/72   Wt Readings from Last 3 Encounters:  03/11/24 214 lb 12.8 oz (97.4 kg)  12/09/23 214 lb 6.4 oz (97.3 kg)  11/25/23 213 lb (96.6 kg)      Last CBC Lab Results  Component Value Date   WBC 3.8 (L) 12/09/2023   HGB 13.0 12/09/2023   HCT 39.4 12/09/2023   MCV 87.5 12/09/2023   MCH 31.3 04/11/2023   RDW 14.7 12/09/2023   PLT 244.0 12/09/2023   Last metabolic panel Lab Results  Component Value Date   GLUCOSE 95 03/11/2024   NA 141 03/11/2024   K 4.5 03/11/2024   CL 102 03/11/2024   CO2 29 03/11/2024   BUN 22 03/11/2024   CREATININE 1.08 03/11/2024   GFR 68.27 03/11/2024   CALCIUM  10.1 03/11/2024   PHOS 3.3 07/31/2010   PROT 7.4 03/11/2024   ALBUMIN 4.6 03/11/2024   BILITOT 0.4 03/11/2024   ALKPHOS 48 03/11/2024   AST 36 03/11/2024   ALT 33 03/11/2024   ANIONGAP 8 10/05/2020   Last lipids Lab Results  Component Value Date   CHOL 158 03/11/2024   HDL 33.10 (L) 03/11/2024   LDLCALC 83 03/11/2024   TRIG 208.0 (H) 03/11/2024   CHOLHDL 5 03/11/2024   Last hemoglobin A1c Lab Results  Component Value Date   HGBA1C 6.6 (H) 03/11/2024   Last thyroid  functions Lab Results  Component Value Date   TSH 1.89 03/11/2024   Last vitamin D  Lab Results  Component Value Date   VD25OH 92.95 03/11/2024   Last vitamin B12 and Folate Lab Results  Component Value Date   VITAMINB12 737 01/26/2019         Assessment & Plan:   Assessment and Plan Assessment & Plan Right hip pain Chronic pain worsening with ambulation and stairs. No recent imaging. - Order hip X-ray.  Chronic pain of bilateral shoulders Chronic pain from rotator cuff injury. No prior injections. - Refer to sports med for evaluation and possible injection therapy.  Hypothyroidism Thyroid  function to be assessed due  to low energy. - Check thyroid  function  tests.  Essential hypertension - Controlled - Continue metoprolol , olmesartan , HCTZ  Abnormal glucose - A1c today  Vitamin D  deficiency - Vitamin D  levels today, continue supplementation  Mixed hyperlipidemia - Lipid panel today - Continue Zetia , gemfibrozil   MDD, recurrent, partial remission - Stable, continue Wellbutrin  and Lexapro   General Health Maintenance Discussed flu and COVID vaccinations, including potential mild symptoms. - Send COVID vaccine prescription to pharmacy. - Advise flu shot at pharmacy.     Orders Placed This Encounter  Procedures   DG HIP UNILAT W OR W/O PELVIS 2-3 VIEWS RIGHT    Standing Status:   Future    Number of Occurrences:   1    Expiration Date:   09/09/2024    Reason for Exam (SYMPTOM  OR DIAGNOSIS REQUIRED):   Right hip pain    Preferred imaging location?:   [facility]   Flu vaccine HIGH DOSE PF(Fluzone Trivalent)   Comprehensive metabolic panel with GFR    Release to patient:   Immediate [1]   Hemoglobin A1c   Lipid panel   TSH   VITAMIN D  25 Hydroxy (Vit-D Deficiency, Fractures)   Ambulatory referral to Sports Medicine    Referral Priority:   Routine    Referral Type:   Consultation    Number of Visits Requested:   1     Meds ordered this encounter  Medications   COVID-19 mRNA vaccine, Moderna, >/= 39yrs, (SPIKEVAX) injection    Sig: Inject 0.5 mLs into the muscle once for 1 dose. Inject 0.5mL into the muscle once for once dose    Dispense:  0.5 mL    Refill:  0    Substitute brand per preference/availability    Return for cpe scheduled for Jan.  Corean LITTIE Ku, FNP

## 2024-03-15 ENCOUNTER — Other Ambulatory Visit (HOSPITAL_COMMUNITY): Payer: Self-pay

## 2024-03-15 NOTE — Progress Notes (Unsigned)
   LILLETTE Ileana Collet, PhD, LAT, ATC acting as a scribe for Artist Lloyd, MD.  [name] [name] is a 73 y.o. male who presents to [facility] today for R hip pain x ***. Pt locates pain to ***  Radiates: Aggravates: Treatment tried:  Pt also c/o bilat shoulder pain x ***. He notes hx of prior RC injury  Pertinent review of systems: ***  Relevant historical information: ***   Exam:  There were no vitals taken for this visit. General: Well Developed, well nourished, and in no acute distress.   MSK: ***    Lab and Radiology Results No results found for this or any previous visit (from the past 72 hours). No results found.     Assessment and Plan: 73 y.o. male with ***   PDMP not reviewed this encounter. No orders of the defined types were placed in this encounter.  No orders of the defined types were placed in this encounter.    Discussed warning signs or symptoms. Please see discharge instructions. Patient expresses understanding.   ***

## 2024-03-16 ENCOUNTER — Ambulatory Visit

## 2024-03-16 ENCOUNTER — Other Ambulatory Visit: Payer: Self-pay

## 2024-03-16 ENCOUNTER — Encounter: Payer: Self-pay | Admitting: Family Medicine

## 2024-03-16 ENCOUNTER — Ambulatory Visit: Admitting: Family Medicine

## 2024-03-16 VITALS — BP 130/84 | HR 66 | Ht 67.5 in | Wt 230.0 lb

## 2024-03-16 DIAGNOSIS — M25512 Pain in left shoulder: Secondary | ICD-10-CM

## 2024-03-16 DIAGNOSIS — G8929 Other chronic pain: Secondary | ICD-10-CM

## 2024-03-16 DIAGNOSIS — M19011 Primary osteoarthritis, right shoulder: Secondary | ICD-10-CM | POA: Diagnosis not present

## 2024-03-16 DIAGNOSIS — M25551 Pain in right hip: Secondary | ICD-10-CM

## 2024-03-16 DIAGNOSIS — M25511 Pain in right shoulder: Secondary | ICD-10-CM

## 2024-03-16 DIAGNOSIS — M545 Low back pain, unspecified: Secondary | ICD-10-CM

## 2024-03-16 NOTE — Patient Instructions (Addendum)
 Thank you for coming in today.   Please get an Xray today before you leave   A referral for physical therapy has been submitted. A representative from the physical therapy office will contact you to coordinate scheduling after confirming your benefits with your insurance provider. If you do not hear from the physical therapy office within the next 1-2 weeks, please let us  know.   Let us  know if you would like to get set up with a custom brace for drop foot.   See you back in 2 months.

## 2024-03-16 NOTE — Addendum Note (Signed)
 Addended by: GEROME ILEANA RAMAN on: 03/16/2024 11:42 AM   Modules accepted: Orders

## 2024-03-17 ENCOUNTER — Ambulatory Visit: Payer: Self-pay | Admitting: Family Medicine

## 2024-03-17 NOTE — Progress Notes (Signed)
 Left shoulder x-ray shows evidence of chronic rotator cuff tear and some arthritis.

## 2024-03-17 NOTE — Progress Notes (Signed)
 Right shoulder x-ray shows evidence of chronic rotator cuff tear and some arthritis.

## 2024-03-19 ENCOUNTER — Other Ambulatory Visit (HOSPITAL_COMMUNITY): Payer: Self-pay

## 2024-03-19 MED ORDER — COMIRNATY 30 MCG/0.3ML IM SUSY
0.3000 mL | PREFILLED_SYRINGE | Freq: Once | INTRAMUSCULAR | 0 refills | Status: AC
  Filled 2024-03-19: qty 0.3, 1d supply, fill #0

## 2024-03-21 ENCOUNTER — Other Ambulatory Visit (HOSPITAL_COMMUNITY): Payer: Self-pay

## 2024-03-22 ENCOUNTER — Other Ambulatory Visit: Payer: Self-pay

## 2024-03-28 ENCOUNTER — Other Ambulatory Visit: Payer: Self-pay | Admitting: Family Medicine

## 2024-03-28 DIAGNOSIS — F325 Major depressive disorder, single episode, in full remission: Secondary | ICD-10-CM

## 2024-03-28 MED ORDER — ESCITALOPRAM OXALATE 20 MG PO TABS
20.0000 mg | ORAL_TABLET | Freq: Every day | ORAL | 0 refills | Status: AC
  Filled 2024-03-28: qty 90, 90d supply, fill #0

## 2024-03-29 ENCOUNTER — Other Ambulatory Visit: Payer: Self-pay

## 2024-03-29 ENCOUNTER — Other Ambulatory Visit (HOSPITAL_COMMUNITY): Payer: Self-pay

## 2024-04-08 ENCOUNTER — Encounter: Payer: Self-pay | Admitting: Physical Therapy

## 2024-04-08 ENCOUNTER — Other Ambulatory Visit: Payer: Self-pay

## 2024-04-08 ENCOUNTER — Ambulatory Visit: Attending: Family Medicine | Admitting: Physical Therapy

## 2024-04-08 DIAGNOSIS — G8929 Other chronic pain: Secondary | ICD-10-CM | POA: Diagnosis not present

## 2024-04-08 DIAGNOSIS — M25512 Pain in left shoulder: Secondary | ICD-10-CM | POA: Insufficient documentation

## 2024-04-08 DIAGNOSIS — M545 Low back pain, unspecified: Secondary | ICD-10-CM | POA: Insufficient documentation

## 2024-04-08 DIAGNOSIS — M5459 Other low back pain: Secondary | ICD-10-CM | POA: Insufficient documentation

## 2024-04-08 DIAGNOSIS — M25511 Pain in right shoulder: Secondary | ICD-10-CM | POA: Diagnosis not present

## 2024-04-08 DIAGNOSIS — M25551 Pain in right hip: Secondary | ICD-10-CM | POA: Insufficient documentation

## 2024-04-08 NOTE — Therapy (Signed)
 OUTPATIENT PHYSICAL THERAPY LOWER EXTREMITY EVALUATION   Patient Name: [name] [name] MRN: [id number] DOB:1950-08-26, 73 y.o., male Today's Date: 04/08/2024  END OF SESSION:  PT End of Session - 04/08/24 0753     Visit Number 1    Date for Recertification  07/10/23    Authorization Type HTA    PT Start Time 0753    PT Stop Time 0840    PT Time Calculation (min) 47 min    Activity Tolerance Patient tolerated treatment well    Behavior During Therapy [facility] for tasks assessed/performed          Past Medical History:  Diagnosis Date   Cardiomegaly    Colon polyp    Depression    Fatty liver disease, nonalcoholic    Gallstones    Hypertension    Hypogonadism male    Mixed hyperlipidemia    Obesity    Pancreas (digestive gland) works poorly    Prediabetes    Sleep apnea    Testosterone  deficiency 08/03/2009   Thyroid  disease    Vitamin D  deficiency    Past Surgical History:  Procedure Laterality Date   ANKLE FRACTURE SURGERY Left 1986   APPENDECTOMY  1964   CARPAL TUNNEL RELEASE Bilateral 1996   CHOLECYSTECTOMY  1976   COLONOSCOPY     POLYPECTOMY     TIBIA IM NAIL INSERTION Right 03/21/2016   Procedure: INTRAMEDULLARY (IM) NAIL TIBIAL;  Surgeon: Evalene JONETTA Chancy, MD;  Location: [facility] OR;  Service: Orthopedics;  Laterality: Right;   Patient Active Problem List   Diagnosis Date Noted   Chronic pain of both shoulders 03/11/2024   Right hip pain 03/11/2024   At increased risk of exposure to COVID-19 virus 09/09/2023   Recurrent dry cough 08/29/2021   Multiple fractures of ribs, left side, initial encounter for closed fracture 10/04/2020   Fatty liver disease, nonalcoholic 07/15/2018   Abnormal glucose 07/13/2018   Erectile dysfunction 09/12/2014   MDD (major depressive disorder), recurrent, in partial remission 09/12/2014   Myositis 12/31/2013   Medication management 12/31/2013   Hypothyroidism 12/31/2013   Left foot drop 08/30/2013   Vitamin D  deficiency     Hyperlipidemia 08/04/2009   Morbid obesity (HCC) - BMI 30+ with OSA 08/03/2009   Essential hypertension 08/01/2009   Obstructive sleep apnea 08/01/2009    PCP: CANDIE Ku, NP  REFERRING PROVIDER: Joane, MD  REFERRING DIAG: hip pain, shoulder pain and LBP  THERAPY DIAG:  Chronic pain of both shoulders  Pain in right hip  Other low back pain  Rationale for Evaluation and Treatment: Rehabilitation  ONSET DATE: 03/17/24  SUBJECTIVE:   SUBJECTIVE STATEMENT: Reports falls years ago that caused shoulder issues, has arthritis and RC tearing, degenerative changes in the hip and back.  REports that he has put up with a lot and ignored a lot, reports that recently he has had a lot of difficulty walking, with stairs, reaching and lifting, standing.  PERTINENT HISTORY: Depression, HTN, sleep apnea, ankle surgery, CT release, IM nail right tibia PAIN:  Are you having pain? Yes: NPRS scale: 0/10 shoulders, hip and back Pain location: low back, both shoulders and right hip Pain description: dull ache Aggravating factors: walking, standing stairs, reaching, lifting pain up to 9/10 Relieving factors: rest, inactivity, OTC pain meds 0/10  PRECAUTIONS: Fall  RED FLAGS: None   WEIGHT BEARING RESTRICTIONS: No  FALLS:  Has patient fallen in last 6 months? Yes. Number of falls 2  LIVING ENVIRONMENT: Lives with: lives  with their family Lives in: House/apartment Stairs: 3 into the home with handrail Has following equipment at home: Single point cane  OCCUPATION: retired  PLOF: Independent and some house work, some yard work  PATIENT GOALS: better balance, less pain, tolerate activity  NEXT MD VISIT: unsure  OBJECTIVE:  Note: Objective measures were completed at Evaluation unless otherwise noted.  DIAGNOSTIC FINDINGS:  IMPRESSION: 1. No fracture or dislocation of the shoulders. 2. High riding position of the glenohumeral joints, right-greater-than-left, with mild subacromial  pseudo arthrosis on the right. Findings are consistent with chronic rotator cuff tear. 3. Moderate bilateral acromioclavicular arthrosis and mild bilateral glenohumeral arthrosis.  IMPRESSION: 1. Minimal right hip degenerative changes. 2. Lower lumbar spine degenerative changes.  COGNITION: Overall cognitive status: Within functional limits for tasks assessed     SENSATION: WFL  MUSCLE LENGTH: Very tight HS and piriformis  POSTURE: rounded shoulders, forward head, and decreased lumbar lordosis  PALPATION: Mild tenderness in the right lateral hip, the low back and the shoulders  LOWER EXTREMITY ROM:  Active ROM Right eval Left eval  Hip flexion 60   Hip extension    Hip abduction 10   Hip adduction    Hip internal rotation    Hip external rotation    Knee flexion    Knee extension    Ankle dorsiflexion    Ankle plantarflexion    Ankle inversion    Ankle eversion     (Blank rows = not tested)  LOWER EXTREMITY MMT:  MMT Right eval Left eval  Hip flexion 4- 4  Hip extension    Hip abduction 4- 4  Hip adduction    Hip internal rotation    Hip external rotation    Knee flexion    Knee extension    Ankle dorsiflexion 4- 0  Ankle plantarflexion  3+  Ankle inversion  3+  Ankle eversion  3+   (Blank rows = not tested)  Cannot stand on the right leg due to weakness SHOULDER AROM:   Flexion R/L 140, abduction 120 bilaterally, ER 40, IR 40 some pain with all  SHOULDER MMT:  4-/5 ER/IR, FLexion/abduction 3+/5   Shoulder tests:  pain with empty can, weak  LUMBAR ROM:   decreased 50% with all motions with c/o tightness  FUNCTIONAL TESTS:  5 times sit to stand: 26 seconds Timed up and go (TUG): 20 seconds BERG: 36/56    GAIT: Distance walked: 60 feet Assistive device utilized: has a cane and an AFO for the left ankle and None Level of assistance: SBA Comments: left drop foot, with exagerrated left hip flexion and knee flexion to clear foot                                                                                                                                 TREATMENT DATE:  04/08/24 Evaluation, HEP, POC    PATIENT EDUCATION:  Education details: HEP/POC Person educated:  Patient Education method: Explanation, Demonstration, Verbal cues, and Handouts Education comprehension: verbalized understanding  HOME EXERCISE PROGRAM: Access Code: [email or web address] URL: [email or web address] Date: 04/08/2024 Prepared by: Ozell Mainland  Exercises - Standing Tandem Balance with Counter Support  - 1 x daily - 7 x weekly - 1 sets - 10 reps - 10 hold - Standing Hip Abduction with Counter Support  - 2 x daily - 7 x weekly - 1 sets - 10 reps - 3 hold - Standing March with Counter Support  - 2 x daily - 7 x weekly - 1 sets - 10 reps - 3 hold - Standing Hip Extension with Counter Support  - 2 x daily - 7 x weekly - 1 sets - 10 reps - 3 hold - Heel Raises with Counter Support  - 2 x daily - 7 x weekly - 1 sets - 10 reps - 3 hold  ASSESSMENT:  CLINICAL IMPRESSION: Patient is a 73 y.o. male who was seen today for physical therapy evaluation and treatment for LBP, hip pain and shoulder pain.  Biggest issue is poor balance, with the testing above he is at a very high risk for falls.   Has a drop foot on the left.    OBJECTIVE IMPAIRMENTS: Abnormal gait, cardiopulmonary status limiting activity, decreased activity tolerance, decreased balance, decreased coordination, decreased endurance, decreased mobility, difficulty walking, decreased ROM, decreased strength, increased fascial restrictions, increased muscle spasms, impaired flexibility, improper body mechanics, postural dysfunction, and pain.   REHAB POTENTIAL: Good  CLINICAL DECISION MAKING: Evolving/moderate complexity  EVALUATION COMPLEXITY: Moderate   GOALS: Goals reviewed with patient? Yes  SHORT TERM GOALS: Target date: 04/29/24 Independent with initial HEP Baseline: Goal  status: INITIAL  LONG TERM GOALS: Target date: 07/09/24  Independent with advanced HEP Baseline:  Goal status: INITIAL  2.  Understand posture and body mechanics Baseline:  Goal status: INITIAL  3.  Improve TUG to 14 seconds Baseline: 20 seconds Goal status: INITIAL  4.  Improve Berg balance to 45/56 Baseline: 36/56 Goal status: INITIAL  5.  Decrease pain with shopping 50% Baseline: pain up to 8-9/10 with shopping at [employer] Goal status: INITIAL  PLAN:  PT FREQUENCY: 1-2x/week  PT DURATION: 12 weeks  PLANNED INTERVENTIONS: 97164- PT Re-evaluation, 97110-Therapeutic exercises, 97530- Therapeutic activity, 97112- Neuromuscular re-education, 97535- Self Care, [id number]- Manual therapy, [id number]- Gait training, [phone]- Splinting, H9716- Electrical stimulation (unattended), 97016- Vasopneumatic device, [id number]- Ultrasound, [id number]- Traction (mechanical), [id number]- Ionotophoresis 4mg /ml Dexamethasone , Patient/Family education, Balance training, Stair training, Taping, Joint mobilization, Cryotherapy, and Moist heat  PLAN FOR NEXT SESSION: work on balance, strength, can work on back pain, shoulder pain and right hip pain   Roddrick Sharron W, PT 04/08/2024, 11:56 AM

## 2024-04-12 ENCOUNTER — Ambulatory Visit

## 2024-04-12 DIAGNOSIS — M25551 Pain in right hip: Secondary | ICD-10-CM

## 2024-04-12 DIAGNOSIS — M25511 Pain in right shoulder: Secondary | ICD-10-CM | POA: Diagnosis not present

## 2024-04-12 DIAGNOSIS — M5459 Other low back pain: Secondary | ICD-10-CM

## 2024-04-12 DIAGNOSIS — G8929 Other chronic pain: Secondary | ICD-10-CM

## 2024-04-12 NOTE — Therapy (Signed)
 OUTPATIENT PHYSICAL THERAPY LOWER EXTREMITY TREATMENT   Patient Name: [name] [name] MRN: [id number] DOB:[birth year], 73 y.o., male Today's Date: 04/12/2024  END OF SESSION:  PT End of Session - 04/12/24 0851     Visit Number 2    Date for Recertification  07/10/23    Authorization Type HTA    PT Start Time 0846    PT Stop Time 0930    PT Time Calculation (min) 44 min    Behavior During Therapy [facility] for tasks assessed/performed           Past Medical History:  Diagnosis Date   Cardiomegaly    Colon polyp    Depression    Fatty liver disease, nonalcoholic    Gallstones    Hypertension    Hypogonadism male    Mixed hyperlipidemia    Obesity    Pancreas (digestive gland) works poorly    Prediabetes    Sleep apnea    Testosterone  deficiency 08/03/2009   Thyroid  disease    Vitamin D  deficiency    Past Surgical History:  Procedure Laterality Date   ANKLE FRACTURE SURGERY Left 1986   APPENDECTOMY  1964   CARPAL TUNNEL RELEASE Bilateral 1996   CHOLECYSTECTOMY  1976   COLONOSCOPY     POLYPECTOMY     TIBIA IM NAIL INSERTION Right 03/21/2016   Procedure: INTRAMEDULLARY (IM) NAIL TIBIAL;  Surgeon: Evalene JONETTA Chancy, MD;  Location: [facility] OR;  Service: Orthopedics;  Laterality: Right;   Patient Active Problem List   Diagnosis Date Noted   Chronic pain of both shoulders 03/11/2024   Right hip pain 03/11/2024   At increased risk of exposure to COVID-19 virus 09/09/2023   Recurrent dry cough 08/29/2021   Multiple fractures of ribs, left side, initial encounter for closed fracture 10/04/2020   Fatty liver disease, nonalcoholic 07/15/2018   Abnormal glucose 07/13/2018   Erectile dysfunction 09/12/2014   MDD (major depressive disorder), recurrent, in partial remission 09/12/2014   Myositis 12/31/2013   Medication management 12/31/2013   Hypothyroidism 12/31/2013   Left foot drop 08/30/2013   Vitamin D  deficiency    Hyperlipidemia 08/04/2009   Morbid obesity (HCC) - BMI 30+  with OSA 08/03/2009   Essential hypertension 08/01/2009   Obstructive sleep apnea 08/01/2009    PCP: CANDIE Ku, NP  REFERRING PROVIDER: Joane, MD  REFERRING DIAG: hip pain, shoulder pain and LBP  THERAPY DIAG:  Chronic pain of both shoulders  Pain in right hip  Other low back pain  Rationale for Evaluation and Treatment: Rehabilitation  ONSET DATE: 03/17/24  SUBJECTIVE:   SUBJECTIVE STATEMENT: 04/12/24: Pt reports no pain upon arrival of PT today; pt states he feels good today and has started on working on exercises at home as he would like to build up his strength.     Reports falls years ago that caused shoulder issues, has arthritis and RC tearing, degenerative changes in the hip and back.  REports that he has put up with a lot and ignored a lot, reports that recently he has had a lot of difficulty walking, with stairs, reaching and lifting, standing.  PERTINENT HISTORY: Depression, HTN, sleep apnea, ankle surgery, CT release, IM nail right tibia PAIN:  Are you having pain? Yes: NPRS scale: 0/10 shoulders, hip and back Pain location: low back, both shoulders and right hip Pain description: dull ache Aggravating factors: walking, standing stairs, reaching, lifting pain up to 9/10 Relieving factors: rest, inactivity, OTC pain meds 0/10  PRECAUTIONS: Fall  RED FLAGS: None   WEIGHT BEARING RESTRICTIONS: No  FALLS:  Has patient fallen in last 6 months? Yes. Number of falls 2  LIVING ENVIRONMENT: Lives with: lives with their family Lives in: House/apartment Stairs: 3 into the home with handrail Has following equipment at home: Single point cane  OCCUPATION: retired  PLOF: Independent and some house work, some yard work  PATIENT GOALS: better balance, less pain, tolerate activity  NEXT MD VISIT: unsure  OBJECTIVE:  Note: Objective measures were completed at Evaluation unless otherwise noted.  DIAGNOSTIC FINDINGS:  IMPRESSION: 1. No fracture or  dislocation of the shoulders. 2. High riding position of the glenohumeral joints, right-greater-than-left, with mild subacromial pseudo arthrosis on the right. Findings are consistent with chronic rotator cuff tear. 3. Moderate bilateral acromioclavicular arthrosis and mild bilateral glenohumeral arthrosis.  IMPRESSION: 1. Minimal right hip degenerative changes. 2. Lower lumbar spine degenerative changes.  COGNITION: Overall cognitive status: Within functional limits for tasks assessed     SENSATION: WFL  MUSCLE LENGTH: Very tight HS and piriformis  POSTURE: rounded shoulders, forward head, and decreased lumbar lordosis  PALPATION: Mild tenderness in the right lateral hip, the low back and the shoulders  LOWER EXTREMITY ROM:  Active ROM Right eval Left eval  Hip flexion 60   Hip extension    Hip abduction 10   Hip adduction    Hip internal rotation    Hip external rotation    Knee flexion    Knee extension    Ankle dorsiflexion    Ankle plantarflexion    Ankle inversion    Ankle eversion     (Blank rows = not tested)  LOWER EXTREMITY MMT:  MMT Right eval Left eval  Hip flexion 4- 4  Hip extension    Hip abduction 4- 4  Hip adduction    Hip internal rotation    Hip external rotation    Knee flexion    Knee extension    Ankle dorsiflexion 4- 0  Ankle plantarflexion  3+  Ankle inversion  3+  Ankle eversion  3+   (Blank rows = not tested)  Cannot stand on the right leg due to weakness SHOULDER AROM:   Flexion R/L 140, abduction 120 bilaterally, ER 40, IR 40 some pain with all  SHOULDER MMT:  4-/5 ER/IR, FLexion/abduction 3+/5   Shoulder tests:  pain with empty can, weak  LUMBAR ROM:   decreased 50% with all motions with c/o tightness  FUNCTIONAL TESTS:  5 times sit to stand: 26 seconds Timed up and go (TUG): 20 seconds BERG: 36/56    GAIT: Distance walked: 60 feet Assistive device utilized: has a cane and an AFO for the left ankle and  None Level of assistance: SBA Comments: left drop foot, with exagerrated left hip flexion and knee flexion to clear foot  TREATMENT DATE:  04/12/24 NuStep L5x33min 3 Way hip (flex/abd/ext) red resistance band  Hamstring Curls 25# 2x10 Leg ext 15# 2x10 Tricep Pulldowns 20# 2x10 Seated Row 30# 2x10 Lateral Raises 3# 2x10 STS with overhead press  04/08/24 Evaluation, HEP, POC    PATIENT EDUCATION:  Education details: HEP/POC Person educated: Patient Education method: Programmer, Multimedia, Facilities Manager, Verbal cues, and Handouts Education comprehension: verbalized understanding  HOME EXERCISE PROGRAM: Access Code: [email or web address] URL: [email or web address] Date: 04/08/2024 Prepared by: Ozell Mainland  Exercises - Standing Tandem Balance with Counter Support  - 1 x daily - 7 x weekly - 1 sets - 10 reps - 10 hold - Standing Hip Abduction with Counter Support  - 2 x daily - 7 x weekly - 1 sets - 10 reps - 3 hold - Standing March with Counter Support  - 2 x daily - 7 x weekly - 1 sets - 10 reps - 3 hold - Standing Hip Extension with Counter Support  - 2 x daily - 7 x weekly - 1 sets - 10 reps - 3 hold - Heel Raises with Counter Support  - 2 x daily - 7 x weekly - 1 sets - 10 reps - 3 hold  ASSESSMENT:  CLINICAL IMPRESSION:  04/12/24: Pt tolerated treatment well with the ability to progress strengthening exercises and activities. Pt required minimal rest periods in between activities. Pt exhibits decreased muscle endurance an desires to strengthen musculature surrounding the hip and shoulder joints. Pt ROM in shoulders WFL with minimal stiffness today; pt exhibits stiffness in hamstrings. Pt will continue to benefit from PT to address functional strength and endurance deficits.    Patient is a 73 y.o. male who was seen today for physical therapy  evaluation and treatment for LBP, hip pain and shoulder pain.  Biggest issue is poor balance, with the testing above he is at a very high risk for falls.   Has a drop foot on the left.    OBJECTIVE IMPAIRMENTS: Abnormal gait, cardiopulmonary status limiting activity, decreased activity tolerance, decreased balance, decreased coordination, decreased endurance, decreased mobility, difficulty walking, decreased ROM, decreased strength, increased fascial restrictions, increased muscle spasms, impaired flexibility, improper body mechanics, postural dysfunction, and pain.   REHAB POTENTIAL: Good  CLINICAL DECISION MAKING: Evolving/moderate complexity  EVALUATION COMPLEXITY: Moderate   GOALS: Goals reviewed with patient? Yes  SHORT TERM GOALS: Target date: 04/29/24 Independent with initial HEP Baseline: Goal status: IN PROGRESS participating in more than half of HEP independently 04/12/24  LONG TERM GOALS: Target date: 07/09/24  Independent with advanced HEP Baseline:  Goal status: IN PROGRESS participating in more than half of HEP independently 04/12/24  2.  Understand posture and body mechanics Baseline:  Goal status: IN PROGRESS 04/12/24  3.  Improve TUG to 14 seconds Baseline: 20 seconds Goal status: INITIAL  4.  Improve Berg balance to 45/56 Baseline: 36/56 Goal status: INITIAL  5.  Decrease pain with shopping 50% Baseline: pain up to 8-9/10 with shopping at [employer] Goal status: IN PROGRESS still unable to shop for prolonged time w/o pain increase 04/12/24  PLAN:  PT FREQUENCY: 1-2x/week  PT DURATION: 12 weeks  PLANNED INTERVENTIONS: 97164- PT Re-evaluation, 97110-Therapeutic exercises, 97530- Therapeutic activity, 97112- Neuromuscular re-education, 97535- Self Care, 02859- Manual therapy, [id number]- Gait training, [id number]- Splinting, H9716- Electrical stimulation (unattended), 97016- Vasopneumatic device, [id number]- Ultrasound, 02987- Traction (mechanical), [id number]- Ionotophoresis  4mg /ml Dexamethasone , Patient/Family education, Balance training, Stair training, Taping, Joint mobilization, Cryotherapy, and Moist heat  PLAN FOR NEXT SESSION: work on balance,  strength, can work on back pain, shoulder pain and right hip pain   Thersia Alder, Student-PT 04/12/2024, 11:14 AM

## 2024-04-13 ENCOUNTER — Encounter: Payer: Medicare HMO | Admitting: Nurse Practitioner

## 2024-04-25 ENCOUNTER — Other Ambulatory Visit: Payer: Self-pay | Admitting: Family Medicine

## 2024-04-25 DIAGNOSIS — I10 Essential (primary) hypertension: Secondary | ICD-10-CM

## 2024-04-26 ENCOUNTER — Other Ambulatory Visit: Payer: Self-pay

## 2024-04-26 ENCOUNTER — Other Ambulatory Visit (HOSPITAL_COMMUNITY): Payer: Self-pay

## 2024-04-26 MED ORDER — HYDROCHLOROTHIAZIDE 25 MG PO TABS
25.0000 mg | ORAL_TABLET | Freq: Every morning | ORAL | 0 refills | Status: AC
  Filled 2024-04-26: qty 90, 90d supply, fill #0

## 2024-04-26 MED ORDER — METOPROLOL TARTRATE 25 MG PO TABS
ORAL_TABLET | ORAL | 1 refills | Status: AC
  Filled 2024-04-26: qty 180, 90d supply, fill #0

## 2024-04-27 ENCOUNTER — Encounter: Payer: Self-pay | Admitting: Physical Therapy

## 2024-04-27 ENCOUNTER — Ambulatory Visit: Admitting: Physical Therapy

## 2024-04-27 DIAGNOSIS — M25551 Pain in right hip: Secondary | ICD-10-CM | POA: Diagnosis present

## 2024-04-27 DIAGNOSIS — M25512 Pain in left shoulder: Secondary | ICD-10-CM | POA: Insufficient documentation

## 2024-04-27 DIAGNOSIS — M25511 Pain in right shoulder: Secondary | ICD-10-CM | POA: Insufficient documentation

## 2024-04-27 DIAGNOSIS — M5459 Other low back pain: Secondary | ICD-10-CM | POA: Diagnosis present

## 2024-04-27 DIAGNOSIS — G8929 Other chronic pain: Secondary | ICD-10-CM | POA: Insufficient documentation

## 2024-04-27 NOTE — Therapy (Signed)
 OUTPATIENT PHYSICAL THERAPY LOWER EXTREMITY TREATMENT   Patient Name: [name] [name] MRN: [id number] DOB:Feb 24, 1951, 73 y.o., male Today's Date: 04/27/2024  END OF SESSION:  PT End of Session - 04/27/24 0806     Visit Number 3    Date for Recertification  07/10/23    PT Start Time 0802    PT Stop Time 0845    PT Time Calculation (min) 43 min    Activity Tolerance Patient tolerated treatment well    Behavior During Therapy [facility] for tasks assessed/performed           Past Medical History:  Diagnosis Date   Cardiomegaly    Colon polyp    Depression    Fatty liver disease, nonalcoholic    Gallstones    Hypertension    Hypogonadism male    Mixed hyperlipidemia    Obesity    Pancreas (digestive gland) works poorly    Prediabetes    Sleep apnea    Testosterone  deficiency 08/03/2009   Thyroid  disease    Vitamin D  deficiency    Past Surgical History:  Procedure Laterality Date   ANKLE FRACTURE SURGERY Left 1986   APPENDECTOMY  1964   CARPAL TUNNEL RELEASE Bilateral 1996   CHOLECYSTECTOMY  1976   COLONOSCOPY     POLYPECTOMY     TIBIA IM NAIL INSERTION Right 03/21/2016   Procedure: INTRAMEDULLARY (IM) NAIL TIBIAL;  Surgeon: Evalene JONETTA Chancy, MD;  Location: [facility] OR;  Service: Orthopedics;  Laterality: Right;   Patient Active Problem List   Diagnosis Date Noted   Chronic pain of both shoulders 03/11/2024   Right hip pain 03/11/2024   At increased risk of exposure to COVID-19 virus 09/09/2023   Recurrent dry cough 08/29/2021   Multiple fractures of ribs, left side, initial encounter for closed fracture 10/04/2020   Fatty liver disease, nonalcoholic 07/15/2018   Abnormal glucose 07/13/2018   Erectile dysfunction 09/12/2014   MDD (major depressive disorder), recurrent, in partial remission 09/12/2014   Myositis 12/31/2013   Medication management 12/31/2013   Hypothyroidism 12/31/2013   Left foot drop 08/30/2013   Vitamin D  deficiency    Hyperlipidemia 08/04/2009    Morbid obesity (HCC) - BMI 30+ with OSA 08/03/2009   Essential hypertension 08/01/2009   Obstructive sleep apnea 08/01/2009    PCP: CANDIE Ku, NP  REFERRING PROVIDER: Joane, MD  REFERRING DIAG: hip pain, shoulder pain and LBP  THERAPY DIAG:  Chronic pain of both shoulders  Pain in right hip  Other low back pain  Rationale for Evaluation and Treatment: Rehabilitation  ONSET DATE: 03/17/24  SUBJECTIVE:   SUBJECTIVE STATEMENT: Pretty good, R hip pain if he walks a lot    Reports falls years ago that caused shoulder issues, has arthritis and RC tearing, degenerative changes in the hip and back.  REports that he has put up with a lot and ignored a lot, reports that recently he has had a lot of difficulty walking, with stairs, reaching and lifting, standing.  PERTINENT HISTORY: Depression, HTN, sleep apnea, ankle surgery, CT release, IM nail right tibia PAIN:  Are you having pain? Yes: NPRS scale: 05/10 back Pain location: low back Pain description: dull ache Aggravating factors: walking, standing stairs, reaching, lifting pain up to 9/10 Relieving factors: rest, inactivity, OTC pain meds 0/10  PRECAUTIONS: Fall  RED FLAGS: None   WEIGHT BEARING RESTRICTIONS: No  FALLS:  Has patient fallen in last 6 months? Yes. Number of falls 2  LIVING ENVIRONMENT: Lives with: lives  with their family Lives in: House/apartment Stairs: 3 into the home with handrail Has following equipment at home: Single point cane  OCCUPATION: retired  PLOF: Independent and some house work, some yard work  PATIENT GOALS: better balance, less pain, tolerate activity  NEXT MD VISIT: unsure  OBJECTIVE:  Note: Objective measures were completed at Evaluation unless otherwise noted.  DIAGNOSTIC FINDINGS:  IMPRESSION: 1. No fracture or dislocation of the shoulders. 2. High riding position of the glenohumeral joints, right-greater-than-left, with mild subacromial pseudo arthrosis on the  right. Findings are consistent with chronic rotator cuff tear. 3. Moderate bilateral acromioclavicular arthrosis and mild bilateral glenohumeral arthrosis.  IMPRESSION: 1. Minimal right hip degenerative changes. 2. Lower lumbar spine degenerative changes.  COGNITION: Overall cognitive status: Within functional limits for tasks assessed     SENSATION: WFL  MUSCLE LENGTH: Very tight HS and piriformis  POSTURE: rounded shoulders, forward head, and decreased lumbar lordosis  PALPATION: Mild tenderness in the right lateral hip, the low back and the shoulders  LOWER EXTREMITY ROM:  Active ROM Right eval Left eval  Hip flexion 60   Hip extension    Hip abduction 10   Hip adduction    Hip internal rotation    Hip external rotation    Knee flexion    Knee extension    Ankle dorsiflexion    Ankle plantarflexion    Ankle inversion    Ankle eversion     (Blank rows = not tested)  LOWER EXTREMITY MMT:  MMT Right eval Left eval  Hip flexion 4- 4  Hip extension    Hip abduction 4- 4  Hip adduction    Hip internal rotation    Hip external rotation    Knee flexion    Knee extension    Ankle dorsiflexion 4- 0  Ankle plantarflexion  3+  Ankle inversion  3+  Ankle eversion  3+   (Blank rows = not tested)  Cannot stand on the right leg due to weakness SHOULDER AROM:   Flexion R/L 140, abduction 120 bilaterally, ER 40, IR 40 some pain with all  SHOULDER MMT:  4-/5 ER/IR, FLexion/abduction 3+/5   Shoulder tests:  pain with empty can, weak  LUMBAR ROM:   decreased 50% with all motions with c/o tightness  FUNCTIONAL TESTS:  5 times sit to stand: 26 seconds Timed up and go (TUG): 20 seconds BERG: 36/56    GAIT: Distance walked: 60 feet Assistive device utilized: has a cane and an AFO for the left ankle and None Level of assistance: SBA Comments: left drop foot, with exagerrated left hip flexion and knee flexion to clear foot                                                                                                                                 TREATMENT DATE:  04/27/24 NuStep L 5 x 6 min 20lb resisted side steps x5 each  6in  step ups some UE assist x10 each HS curls 25lb 2x15 Leg Ext 10lb 2x15 Sit to stand 2x10  Does extent LE prematurely  Shoulder Ext 10lb 2x10  04/12/24 NuStep L5x4min 3 Way hip (flex/abd/ext) red resistance band  Hamstring Curls 25# 2x10 Leg ext 15# 2x10 Tricep Pulldowns 20# 2x10 Seated Row 30# 2x10 Lateral Raises 3# 2x10 STS with overhead press  04/08/24 Evaluation, HEP, POC    PATIENT EDUCATION:  Education details: HEP/POC Person educated: Patient Education method: Programmer, Multimedia, Facilities Manager, Verbal cues, and Handouts Education comprehension: verbalized understanding  HOME EXERCISE PROGRAM: Access Code: [email or web address] URL: [email or web address] Date: 04/08/2024 Prepared by: Ozell Mainland  Exercises - Standing Tandem Balance with Counter Support  - 1 x daily - 7 x weekly - 1 sets - 10 reps - 10 hold - Standing Hip Abduction with Counter Support  - 2 x daily - 7 x weekly - 1 sets - 10 reps - 3 hold - Standing March with Counter Support  - 2 x daily - 7 x weekly - 1 sets - 10 reps - 3 hold - Standing Hip Extension with Counter Support  - 2 x daily - 7 x weekly - 1 sets - 10 reps - 3 hold - Heel Raises with Counter Support  - 2 x daily - 7 x weekly - 1 sets - 10 reps - 3 hold  ASSESSMENT:  CLINICAL IMPRESSION:  04/27/24: Again pt tolerated treatment well with the ability to progress strengthening exercises and activities. CGA required with step ups and resisted side steps. Increase reps tolerated with HS curls and extensions. Postural weakness present with shoulder extensions.. Pt will continue to benefit from PT to address functional strength and endurance deficits.    Patient is a 73 y.o. male who was seen today for physical therapy evaluation and treatment for LBP, hip pain and shoulder  pain.  Biggest issue is poor balance, with the testing above he is at a very high risk for falls.   Has a drop foot on the left.    OBJECTIVE IMPAIRMENTS: Abnormal gait, cardiopulmonary status limiting activity, decreased activity tolerance, decreased balance, decreased coordination, decreased endurance, decreased mobility, difficulty walking, decreased ROM, decreased strength, increased fascial restrictions, increased muscle spasms, impaired flexibility, improper body mechanics, postural dysfunction, and pain.   REHAB POTENTIAL: Good  CLINICAL DECISION MAKING: Evolving/moderate complexity  EVALUATION COMPLEXITY: Moderate   GOALS: Goals reviewed with patient? Yes  SHORT TERM GOALS: Target date: 04/29/24 Independent with initial HEP Baseline: Goal status: IN PROGRESS participating in more than half of HEP independently 04/12/24  LONG TERM GOALS: Target date: 07/09/24  Independent with advanced HEP Baseline:  Goal status: IN PROGRESS participating in more than half of HEP independently 04/12/24  2.  Understand posture and body mechanics Baseline:  Goal status: IN PROGRESS 04/12/24  3.  Improve TUG to 14 seconds Baseline: 20 seconds Goal status: INITIAL  4.  Improve Berg balance to 45/56 Baseline: 36/56 Goal status: INITIAL  5.  Decrease pain with shopping 50% Baseline: pain up to 8-9/10 with shopping at [employer] Goal status: IN PROGRESS still unable to shop for prolonged time w/o pain increase 04/12/24  PLAN:  PT FREQUENCY: 1-2x/week  PT DURATION: 12 weeks  PLANNED INTERVENTIONS: 97164- PT Re-evaluation, 97110-Therapeutic exercises, 97530- Therapeutic activity, 97112- Neuromuscular re-education, 97535- Self Care, 02859- Manual therapy, [id number]- Gait training, [id number]- Splinting, H9716- Electrical stimulation (unattended), 97016- Vasopneumatic device, [id number]- Ultrasound, [id number]- Traction (mechanical), [id number]- Ionotophoresis 4mg /ml Dexamethasone , Patient/Family education, Balance  training, Stair training, Taping, Joint  mobilization, Cryotherapy, and Moist heat  PLAN FOR NEXT SESSION: work on balance, strength, can work on back pain, shoulder pain and right hip pain   Tanda KANDICE Sorrow, PTA 04/27/2024, 8:06 AM

## 2024-05-04 ENCOUNTER — Ambulatory Visit: Admitting: Physical Therapy

## 2024-05-04 ENCOUNTER — Encounter: Payer: Self-pay | Admitting: Physical Therapy

## 2024-05-04 DIAGNOSIS — M5459 Other low back pain: Secondary | ICD-10-CM

## 2024-05-04 DIAGNOSIS — M25511 Pain in right shoulder: Secondary | ICD-10-CM | POA: Diagnosis not present

## 2024-05-04 DIAGNOSIS — M25551 Pain in right hip: Secondary | ICD-10-CM

## 2024-05-04 DIAGNOSIS — G8929 Other chronic pain: Secondary | ICD-10-CM

## 2024-05-04 NOTE — Therapy (Signed)
 OUTPATIENT PHYSICAL THERAPY LOWER EXTREMITY TREATMENT   Patient Name: KAITLIN ARDITO MRN: [id number] DOB:Sep 05, 1950, 73 y.o., male Today's Date: 05/04/2024  END OF SESSION:  PT End of Session - 05/04/24 0759     Visit Number 4    Date for Recertification  07/10/23    PT Start Time 0800    PT Stop Time 0845    PT Time Calculation (min) 45 min    Activity Tolerance Patient tolerated treatment well    Behavior During Therapy [facility] for tasks assessed/performed           Past Medical History:  Diagnosis Date   Cardiomegaly    Colon polyp    Depression    Fatty liver disease, nonalcoholic    Gallstones    Hypertension    Hypogonadism male    Mixed hyperlipidemia    Obesity    Pancreas (digestive gland) works poorly    Prediabetes    Sleep apnea    Testosterone  deficiency 08/03/2009   Thyroid  disease    Vitamin D  deficiency    Past Surgical History:  Procedure Laterality Date   ANKLE FRACTURE SURGERY Left 1986   APPENDECTOMY  1964   CARPAL TUNNEL RELEASE Bilateral 1996   CHOLECYSTECTOMY  1976   COLONOSCOPY     POLYPECTOMY     TIBIA IM NAIL INSERTION Right 03/21/2016   Procedure: INTRAMEDULLARY (IM) NAIL TIBIAL;  Surgeon: Evalene JONETTA Chancy, MD;  Location: [facility] OR;  Service: Orthopedics;  Laterality: Right;   Patient Active Problem List   Diagnosis Date Noted   Chronic pain of both shoulders 03/11/2024   Right hip pain 03/11/2024   At increased risk of exposure to COVID-19 virus 09/09/2023   Recurrent dry cough 08/29/2021   Multiple fractures of ribs, left side, initial encounter for closed fracture 10/04/2020   Fatty liver disease, nonalcoholic 07/15/2018   Abnormal glucose 07/13/2018   Erectile dysfunction 09/12/2014   MDD (major depressive disorder), recurrent, in partial remission 09/12/2014   Myositis 12/31/2013   Medication management 12/31/2013   Hypothyroidism 12/31/2013   Left foot drop 08/30/2013   Vitamin D  deficiency    Hyperlipidemia 08/04/2009    Morbid obesity (HCC) - BMI 30+ with OSA 08/03/2009   Essential hypertension 08/01/2009   Obstructive sleep apnea 08/01/2009    PCP: CANDIE Ku, NP  REFERRING PROVIDER: Joane, MD  REFERRING DIAG: hip pain, shoulder pain and LBP  THERAPY DIAG:  Chronic pain of both shoulders  Other low back pain  Pain in right hip  Rationale for Evaluation and Treatment: Rehabilitation  ONSET DATE: 03/17/24  SUBJECTIVE:   SUBJECTIVE STATEMENT: A lot of walking on Sunday, hip did not hurt one bit    Reports falls years ago that caused shoulder issues, has arthritis and RC tearing, degenerative changes in the hip and back.  REports that he has put up with a lot and ignored a lot, reports that recently he has had a lot of difficulty walking, with stairs, reaching and lifting, standing.  PERTINENT HISTORY: Depression, HTN, sleep apnea, ankle surgery, CT release, IM nail right tibia PAIN:  Are you having pain? Yes: NPRS scale: 4/10  Pain location: low back Pain description: dull ache Aggravating factors: walking, standing stairs, reaching, lifting pain up to 9/10 Relieving factors: rest, inactivity, OTC pain meds 0/10  PRECAUTIONS: Fall  RED FLAGS: None   WEIGHT BEARING RESTRICTIONS: No  FALLS:  Has patient fallen in last 6 months? Yes. Number of falls 2  LIVING ENVIRONMENT: Lives  with: lives with their family Lives in: House/apartment Stairs: 3 into the home with handrail Has following equipment at home: Single point cane  OCCUPATION: retired  PLOF: Independent and some house work, some yard work  PATIENT GOALS: better balance, less pain, tolerate activity  NEXT MD VISIT: unsure  OBJECTIVE:  Note: Objective measures were completed at Evaluation unless otherwise noted.  DIAGNOSTIC FINDINGS:  IMPRESSION: 1. No fracture or dislocation of the shoulders. 2. High riding position of the glenohumeral joints, right-greater-than-left, with mild subacromial pseudo arthrosis  on the right. Findings are consistent with chronic rotator cuff tear. 3. Moderate bilateral acromioclavicular arthrosis and mild bilateral glenohumeral arthrosis.  IMPRESSION: 1. Minimal right hip degenerative changes. 2. Lower lumbar spine degenerative changes.  COGNITION: Overall cognitive status: Within functional limits for tasks assessed     SENSATION: WFL  MUSCLE LENGTH: Very tight HS and piriformis  POSTURE: rounded shoulders, forward head, and decreased lumbar lordosis  PALPATION: Mild tenderness in the right lateral hip, the low back and the shoulders  LOWER EXTREMITY ROM:  Active ROM Right eval Left eval  Hip flexion 60   Hip extension    Hip abduction 10   Hip adduction    Hip internal rotation    Hip external rotation    Knee flexion    Knee extension    Ankle dorsiflexion    Ankle plantarflexion    Ankle inversion    Ankle eversion     (Blank rows = not tested)  LOWER EXTREMITY MMT:  MMT Right eval Left eval  Hip flexion 4- 4  Hip extension    Hip abduction 4- 4  Hip adduction    Hip internal rotation    Hip external rotation    Knee flexion    Knee extension    Ankle dorsiflexion 4- 0  Ankle plantarflexion  3+  Ankle inversion  3+  Ankle eversion  3+   (Blank rows = not tested)  Cannot stand on the right leg due to weakness SHOULDER AROM:   Flexion R/L 140, abduction 120 bilaterally, ER 40, IR 40 some pain with all  SHOULDER MMT:  4-/5 ER/IR, FLexion/abduction 3+/5   Shoulder tests:  pain with empty can, weak  LUMBAR ROM:   decreased 50% with all motions with c/o tightness  FUNCTIONAL TESTS:  5 times sit to stand: 26 seconds Timed up and go (TUG): 20 seconds BERG: 36/56    GAIT: Distance walked: 60 feet Assistive device utilized: has a cane and an AFO for the left ankle and None Level of assistance: SBA Comments: left drop foot, with exagerrated left hip flexion and knee flexion to clear foot                                                                                                                                 TREATMENT DATE:  05/04/24 NuStep L 5 x 6 min TUG 13.78 sec HS curls 35lb  2x10 Leg Ext 10lb 2x10 6in step ups x10 each 6in lateal step ups x10 each 1 rail Leg press 40lb 3x10 Shoulder Ext 10lb 2x10 standing on airex Sit to stand 2x10  04/27/24 NuStep L 5 x 6 min 20lb resisted side steps x5 each  6in step ups some UE assist x10 each HS curls 25lb 2x15 Leg Ext 10lb 2x15 Sit to stand 2x10  Does extend LE prematurely  Shoulder Ext 10lb 2x10  04/12/24 NuStep L5x83min 3 Way hip (flex/abd/ext) red resistance band  Hamstring Curls 25# 2x10 Leg ext 15# 2x10 Tricep Pulldowns 20# 2x10 Seated Row 30# 2x10 Lateral Raises 3# 2x10 STS with overhead press  04/08/24 Evaluation, HEP, POC    PATIENT EDUCATION:  Education details: HEP/POC Person educated: Patient Education method: Programmer, Multimedia, Facilities Manager, Verbal cues, and Handouts Education comprehension: verbalized understanding  HOME EXERCISE PROGRAM: Access Code: [email or web address] URL: [email or web address] Date: 04/08/2024 Prepared by: Ozell Mainland  Exercises - Standing Tandem Balance with Counter Support  - 1 x daily - 7 x weekly - 1 sets - 10 reps - 10 hold - Standing Hip Abduction with Counter Support  - 2 x daily - 7 x weekly - 1 sets - 10 reps - 3 hold - Standing March with Counter Support  - 2 x daily - 7 x weekly - 1 sets - 10 reps - 3 hold - Standing Hip Extension with Counter Support  - 2 x daily - 7 x weekly - 1 sets - 10 reps - 3 hold - Heel Raises with Counter Support  - 2 x daily - 7 x weekly - 1 sets - 10 reps - 3 hold  ASSESSMENT:  CLINICAL IMPRESSION:  Pt enters doing well. He repots no hip pain after being on his feel a lot. Progressed strengthening exercises and activities. CGA required with step ups due to instability, foot drop on LLE. Increase weight tolerated with HS curls and extensions. Postural  weakness present with shoulder extensions. Pt will continue to benefit from PT to address functional strength and endurance deficits.    Patient is a 73 y.o. male who was seen today for physical therapy evaluation and treatment for LBP, hip pain and shoulder pain.  Biggest issue is poor balance, with the testing above he is at a very high risk for falls.   Has a drop foot on the left.    OBJECTIVE IMPAIRMENTS: Abnormal gait, cardiopulmonary status limiting activity, decreased activity tolerance, decreased balance, decreased coordination, decreased endurance, decreased mobility, difficulty walking, decreased ROM, decreased strength, increased fascial restrictions, increased muscle spasms, impaired flexibility, improper body mechanics, postural dysfunction, and pain.   REHAB POTENTIAL: Good  CLINICAL DECISION MAKING: Evolving/moderate complexity  EVALUATION COMPLEXITY: Moderate   GOALS: Goals reviewed with patient? Yes  SHORT TERM GOALS: Target date: 04/29/24 Independent with initial HEP Baseline: Goal status: participating in more than half of HEP independently 04/12/24, ongoing 05/04/24  LONG TERM GOALS: Target date: 07/09/24  Independent with advanced HEP Baseline:  Goal status: IN PROGRESS participating in more than half of HEP independently 04/12/24  2.  Understand posture and body mechanics Baseline:  Goal status: IN PROGRESS 04/12/24  3.  Improve TUG to 14 seconds Baseline: 20 seconds Goal status: Met 05/04/24  4.  Improve Berg balance to 45/56 Baseline: 36/56 Goal status: INITIAL  5.  Decrease pain with shopping 50% Baseline: pain up to 8-9/10 with shopping at [employer] Goal status: still unable to shop for prolonged time w/o pain increase 04/12/24, 75% if he carries  a cane 05/04/24  PLAN:  PT FREQUENCY: 1-2x/week  PT DURATION: 12 weeks  PLANNED INTERVENTIONS: 97164- PT Re-evaluation, 97110-Therapeutic exercises, 97530- Therapeutic activity, 97112- Neuromuscular  re-education, 97535- Self Care, 02859- Manual therapy, [id number]- Gait training, [phone]- Splinting, H9716- Electrical stimulation (unattended), 97016- Vasopneumatic device, [id number]- Ultrasound, [id number]- Traction (mechanical), [id number]- Ionotophoresis 4mg /ml Dexamethasone , Patient/Family education, Balance training, Stair training, Taping, Joint mobilization, Cryotherapy, and Moist heat  PLAN FOR NEXT SESSION: work on balance, strength, can work on back pain, shoulder pain and right hip pain   Tanda KANDICE Sorrow, PTA 05/04/2024, 8:00 AM

## 2024-05-11 ENCOUNTER — Encounter: Payer: Self-pay | Admitting: Physical Therapy

## 2024-05-11 ENCOUNTER — Ambulatory Visit: Admitting: Physical Therapy

## 2024-05-11 DIAGNOSIS — M25511 Pain in right shoulder: Secondary | ICD-10-CM | POA: Diagnosis not present

## 2024-05-11 DIAGNOSIS — M5459 Other low back pain: Secondary | ICD-10-CM

## 2024-05-11 DIAGNOSIS — M25551 Pain in right hip: Secondary | ICD-10-CM

## 2024-05-11 DIAGNOSIS — G8929 Other chronic pain: Secondary | ICD-10-CM

## 2024-05-11 NOTE — Therapy (Signed)
 OUTPATIENT PHYSICAL THERAPY LOWER EXTREMITY TREATMENT   Patient Name: [name] [name] MRN: [id number] DOB:07-Jun-1950, 73 y.o., male Today's Date: 05/11/2024  END OF SESSION:  PT End of Session - 05/11/24 0751     Visit Number 5    Date for Recertification  07/10/23    Authorization Type HTA    PT Start Time 0751    PT Stop Time 0841    PT Time Calculation (min) 50 min    Activity Tolerance Patient tolerated treatment well    Behavior During Therapy [facility] for tasks assessed/performed           Past Medical History:  Diagnosis Date   Cardiomegaly    Colon polyp    Depression    Fatty liver disease, nonalcoholic    Gallstones    Hypertension    Hypogonadism male    Mixed hyperlipidemia    Obesity    Pancreas (digestive gland) works poorly    Prediabetes    Sleep apnea    Testosterone  deficiency 08/03/2009   Thyroid  disease    Vitamin D  deficiency    Past Surgical History:  Procedure Laterality Date   ANKLE FRACTURE SURGERY Left 1986   APPENDECTOMY  1964   CARPAL TUNNEL RELEASE Bilateral 1996   CHOLECYSTECTOMY  1976   COLONOSCOPY     POLYPECTOMY     TIBIA IM NAIL INSERTION Right 03/21/2016   Procedure: INTRAMEDULLARY (IM) NAIL TIBIAL;  Surgeon: Evalene JONETTA Chancy, MD;  Location: [facility] OR;  Service: Orthopedics;  Laterality: Right;   Patient Active Problem List   Diagnosis Date Noted   Chronic pain of both shoulders 03/11/2024   Right hip pain 03/11/2024   At increased risk of exposure to COVID-19 virus 09/09/2023   Recurrent dry cough 08/29/2021   Multiple fractures of ribs, left side, initial encounter for closed fracture 10/04/2020   Fatty liver disease, nonalcoholic 07/15/2018   Abnormal glucose 07/13/2018   Erectile dysfunction 09/12/2014   MDD (major depressive disorder), recurrent, in partial remission 09/12/2014   Myositis 12/31/2013   Medication management 12/31/2013   Hypothyroidism 12/31/2013   Left foot drop 08/30/2013   Vitamin D  deficiency     Hyperlipidemia 08/04/2009   Morbid obesity (HCC) - BMI 30+ with OSA 08/03/2009   Essential hypertension 08/01/2009   Obstructive sleep apnea 08/01/2009    PCP: CANDIE Ku, NP  REFERRING PROVIDER: Joane, MD  REFERRING DIAG: hip pain, shoulder pain and LBP  THERAPY DIAG:  Chronic pain of both shoulders  Other low back pain  Pain in right hip  Rationale for Evaluation and Treatment: Rehabilitation  ONSET DATE: 03/17/24  SUBJECTIVE:   SUBJECTIVE STATEMENT: Reports that over the weekend he was playing [place] and was in a bucket on a bucket truck and went over a speed bump.  He reports he was jarred around and his knees are very sore.   Reports falls years ago that caused shoulder issues, has arthritis and RC tearing, degenerative changes in the hip and back.  REports that he has put up with a lot and ignored a lot, reports that recently he has had a lot of difficulty walking, with stairs, reaching and lifting, standing.  PERTINENT HISTORY: Depression, HTN, sleep apnea, ankle surgery, CT release, IM nail right tibia PAIN:  Are you having pain? Yes: NPRS scale: 4/10  Pain location: low back Pain description: dull ache Aggravating factors: walking, standing stairs, reaching, lifting pain up to 9/10 Relieving factors: rest, inactivity, OTC pain meds 0/10  PRECAUTIONS:  Fall  RED FLAGS: None   WEIGHT BEARING RESTRICTIONS: No  FALLS:  Has patient fallen in last 6 months? Yes. Number of falls 2  LIVING ENVIRONMENT: Lives with: lives with their family Lives in: House/apartment Stairs: 3 into the home with handrail Has following equipment at home: Single point cane  OCCUPATION: retired  PLOF: Independent and some house work, some yard work  PATIENT GOALS: better balance, less pain, tolerate activity  NEXT MD VISIT: unsure  OBJECTIVE:  Note: Objective measures were completed at Evaluation unless otherwise noted.  DIAGNOSTIC FINDINGS:  IMPRESSION: 1. No fracture or  dislocation of the shoulders. 2. High riding position of the glenohumeral joints, right-greater-than-left, with mild subacromial pseudo arthrosis on the right. Findings are consistent with chronic rotator cuff tear. 3. Moderate bilateral acromioclavicular arthrosis and mild bilateral glenohumeral arthrosis.  IMPRESSION: 1. Minimal right hip degenerative changes. 2. Lower lumbar spine degenerative changes.  COGNITION: Overall cognitive status: Within functional limits for tasks assessed     SENSATION: WFL  MUSCLE LENGTH: Very tight HS and piriformis  POSTURE: rounded shoulders, forward head, and decreased lumbar lordosis  PALPATION: Mild tenderness in the right lateral hip, the low back and the shoulders  LOWER EXTREMITY ROM:  Active ROM Right eval Left eval  Hip flexion 60   Hip extension    Hip abduction 10   Hip adduction    Hip internal rotation    Hip external rotation    Knee flexion    Knee extension    Ankle dorsiflexion    Ankle plantarflexion    Ankle inversion    Ankle eversion     (Blank rows = not tested)  LOWER EXTREMITY MMT:  MMT Right eval Left eval  Hip flexion 4- 4  Hip extension    Hip abduction 4- 4  Hip adduction    Hip internal rotation    Hip external rotation    Knee flexion    Knee extension    Ankle dorsiflexion 4- 0  Ankle plantarflexion  3+  Ankle inversion  3+  Ankle eversion  3+   (Blank rows = not tested)  Cannot stand on the right leg due to weakness SHOULDER AROM:   Flexion R/L 140, abduction 120 bilaterally, ER 40, IR 40 some pain with all  SHOULDER MMT:  4-/5 ER/IR, FLexion/abduction 3+/5   Shoulder tests:  pain with empty can, weak  LUMBAR ROM:   decreased 50% with all motions with c/o tightness  FUNCTIONAL TESTS:  5 times sit to stand: 26 seconds Timed up and go (TUG): 20 seconds BERG: 36/56    GAIT: Distance walked: 60 feet Assistive device utilized: has a cane and an AFO for the left ankle and  None Level of assistance: SBA Comments: left drop foot, with exagerrated left hip flexion and knee flexion to clear foot  TREATMENT DATE:  05/11/24 Nustep level 4 x 6 minutes HS curls 25# UBE level 3 x 4 minutes 5# leg ext 5# straight arm pulls 15# rows Demo of resisted hip extension and abduction 20# lats Feet on ball K2C, rotation, small bridge, isometric abs Passive stretch LE's  05/04/24 NuStep L 5 x 6 min TUG 13.78 sec HS curls 35lb 2x10 Leg Ext 10lb 2x10 6in step ups x10 each 6in lateal step ups x10 each 1 rail Leg press 40lb 3x10 Shoulder Ext 10lb 2x10 standing on airex Sit to stand 2x10  04/27/24 NuStep L 5 x 6 min 20lb resisted side steps x5 each  6in step ups some UE assist x10 each HS curls 25lb 2x15 Leg Ext 10lb 2x15 Sit to stand 2x10  Does extend LE prematurely  Shoulder Ext 10lb 2x10  04/12/24 NuStep L5x30min 3 Way hip (flex/abd/ext) red resistance band  Hamstring Curls 25# 2x10 Leg ext 15# 2x10 Tricep Pulldowns 20# 2x10 Seated Row 30# 2x10 Lateral Raises 3# 2x10 STS with overhead press  04/08/24 Evaluation, HEP, POC    PATIENT EDUCATION:  Education details: HEP/POC Person educated: Patient Education method: Programmer, Multimedia, Facilities Manager, Verbal cues, and Handouts Education comprehension: verbalized understanding  HOME EXERCISE PROGRAM: Access Code: [email or web address] URL: [email or web address] Date: 04/08/2024 Prepared by: Ozell Mainland  Exercises - Standing Tandem Balance with Counter Support  - 1 x daily - 7 x weekly - 1 sets - 10 reps - 10 hold - Standing Hip Abduction with Counter Support  - 2 x daily - 7 x weekly - 1 sets - 10 reps - 3 hold - Standing March with Counter Support  - 2 x daily - 7 x weekly - 1 sets - 10 reps - 3 hold - Standing Hip Extension with Counter Support  - 2 x daily - 7 x weekly -  1 sets - 10 reps - 3 hold - Heel Raises with Counter Support  - 2 x daily - 7 x weekly - 1 sets - 10 reps - 3 hold  ASSESSMENT:  CLINICAL IMPRESSION:  Pt has some increased knee pain from the weekend, We avoided sit to stand and leg press to help this.  I started some gym instruction to see if he can get back to the gym safely. He is unsteady when he first gets up. Increase weight tolerated with HS curls and extensions. Postural weakness present with shoulder extensions. Pt will continue to benefit from PT to address functional strength and endurance deficits.    Patient is a 73 y.o. male who was seen today for physical therapy evaluation and treatment for LBP, hip pain and shoulder pain.  Biggest issue is poor balance, with the testing above he is at a very high risk for falls.   Has a drop foot on the left.    OBJECTIVE IMPAIRMENTS: Abnormal gait, cardiopulmonary status limiting activity, decreased activity tolerance, decreased balance, decreased coordination, decreased endurance, decreased mobility, difficulty walking, decreased ROM, decreased strength, increased fascial restrictions, increased muscle spasms, impaired flexibility, improper body mechanics, postural dysfunction, and pain.   REHAB POTENTIAL: Good  CLINICAL DECISION MAKING: Evolving/moderate complexity  EVALUATION COMPLEXITY: Moderate   GOALS: Goals reviewed with patient? Yes  SHORT TERM GOALS: Target date: 04/29/24 Independent with initial HEP Baseline: Goal status: participating in more than half of HEP independently 04/12/24, ongoing 05/04/24  LONG TERM GOALS: Target date: 07/09/24  Independent with advanced HEP Baseline:  Goal status: IN PROGRESS participating in more than half of HEP independently 04/12/24  2.  Understand posture and body mechanics Baseline:  Goal status: IN PROGRESS 05/11/24  3.  Improve TUG to 14 seconds Baseline: 20 seconds Goal status: Met 05/04/24  4.  Improve Berg balance to  45/56 Baseline: 36/56 Goal status: INITIAL  5.  Decrease pain with shopping 50% Baseline: pain up to 8-9/10 with shopping at [employer] Goal status: still unable to shop for prolonged time w/o pain increase 04/12/24, 75% if he carries a cane 05/04/24  PLAN:  PT FREQUENCY: 1-2x/week  PT DURATION: 12 weeks  PLANNED INTERVENTIONS: 97164- PT Re-evaluation, 97110-Therapeutic exercises, 97530- Therapeutic activity, 97112- Neuromuscular re-education, 97535- Self Care, 02859- Manual therapy, [id number]- Gait training, [phone]- Splinting, H9716- Electrical stimulation (unattended), 97016- Vasopneumatic device, [id number]- Ultrasound, 02987- Traction (mechanical), [id number]- Ionotophoresis 4mg /ml Dexamethasone , Patient/Family education, Balance training, Stair training, Taping, Joint mobilization, Cryotherapy, and Moist heat  PLAN FOR NEXT SESSION: may see a few more visits to educate on the gym activities   Neftali Thurow W, PT 05/11/2024, 7:52 AM

## 2024-05-14 NOTE — Progress Notes (Unsigned)
"       ° °  LILLETTE Ileana Collet, PhD, LAT, ATC acting as a scribe for Artist Lloyd, MD.  [name] [name] is a 73 y.o. male who presents to [facility] today for 55-month f/u R hip, bilat shoulder, and low back pain. Pt was last seen by Dr. Lloyd on 03/16/24 and was referred to PT, completing 5 visits.  Today, pt reports ***  Dx imaging: 03/16/24 R & L shoulder XR  03/11/24 R hip XR  Pertinent review of systems: ***  Relevant historical information: ***   Exam:  There were no vitals taken for this visit. General: Well Developed, well nourished, and in no acute distress.   MSK: ***    Lab and Radiology Results No results found for this or any previous visit (from the past 72 hours). No results found.     Assessment and Plan: 73 y.o. male with ***   PDMP not reviewed this encounter. No orders of the defined types were placed in this encounter.  No orders of the defined types were placed in this encounter.    Discussed warning signs or symptoms. Please see discharge instructions. Patient expresses understanding.   ***  "

## 2024-05-17 ENCOUNTER — Ambulatory Visit: Admitting: Family Medicine

## 2024-05-17 VITALS — BP 124/80 | HR 71 | Ht 67.5 in | Wt 214.0 lb

## 2024-05-17 DIAGNOSIS — M21372 Foot drop, left foot: Secondary | ICD-10-CM

## 2024-05-17 MED ORDER — AMBULATORY NON FORMULARY MEDICATION: 0 refills | Status: AC

## 2024-05-17 NOTE — Patient Instructions (Addendum)
 Thank you for coming in today.  Continue working on the exercises.   We will order an Ankle Foot Orthosis to the [facility] for your foot.  Check back as needed

## 2024-05-19 ENCOUNTER — Ambulatory Visit

## 2024-05-19 DIAGNOSIS — M5459 Other low back pain: Secondary | ICD-10-CM

## 2024-05-19 DIAGNOSIS — G8929 Other chronic pain: Secondary | ICD-10-CM

## 2024-05-19 DIAGNOSIS — M25551 Pain in right hip: Secondary | ICD-10-CM

## 2024-05-19 DIAGNOSIS — M25511 Pain in right shoulder: Secondary | ICD-10-CM | POA: Diagnosis not present

## 2024-05-19 NOTE — Therapy (Signed)
 " OUTPATIENT PHYSICAL THERAPY LOWER EXTREMITY TREATMENT   Patient Name: [name] [name] MRN: [id number] DOB:1950-06-10, 73 y.o., male Today's Date: 05/19/2024  END OF SESSION:  PT End of Session - 05/19/24 0758     Visit Number 6    Date for Recertification  07/09/24    Authorization Type HTA    PT Start Time 0800    PT Stop Time 0840    PT Time Calculation (min) 40 min    Activity Tolerance Patient tolerated treatment well    Behavior During Therapy [facility] for tasks assessed/performed         Past Medical History:  Diagnosis Date   Cardiomegaly    Colon polyp    Depression    Fatty liver disease, nonalcoholic    Gallstones    Hypertension    Hypogonadism male    Mixed hyperlipidemia    Obesity    Pancreas (digestive gland) works poorly    Prediabetes    Sleep apnea    Testosterone  deficiency 08/03/2009   Thyroid  disease    Vitamin D  deficiency    Past Surgical History:  Procedure Laterality Date   ANKLE FRACTURE SURGERY Left 1986   APPENDECTOMY  1964   CARPAL TUNNEL RELEASE Bilateral 1996   CHOLECYSTECTOMY  1976   COLONOSCOPY     POLYPECTOMY     TIBIA IM NAIL INSERTION Right 03/21/2016   Procedure: INTRAMEDULLARY (IM) NAIL TIBIAL;  Surgeon: Evalene JONETTA Chancy, MD;  Location: [facility] OR;  Service: Orthopedics;  Laterality: Right;   Patient Active Problem List   Diagnosis Date Noted   Chronic pain of both shoulders 03/11/2024   Right hip pain 03/11/2024   At increased risk of exposure to COVID-19 virus 09/09/2023   Recurrent dry cough 08/29/2021   Multiple fractures of ribs, left side, initial encounter for closed fracture 10/04/2020   Fatty liver disease, nonalcoholic 07/15/2018   Abnormal glucose 07/13/2018   Erectile dysfunction 09/12/2014   MDD (major depressive disorder), recurrent, in partial remission 09/12/2014   Myositis 12/31/2013   Medication management 12/31/2013   Hypothyroidism 12/31/2013   Left foot drop 08/30/2013   Vitamin D  deficiency     Hyperlipidemia 08/04/2009   Morbid obesity (HCC) - BMI 30+ with OSA 08/03/2009   Essential hypertension 08/01/2009   Obstructive sleep apnea 08/01/2009    PCP: CANDIE Ku, NP  REFERRING PROVIDER: Joane, MD  REFERRING DIAG: hip pain, shoulder pain and LBP  THERAPY DIAG:  Chronic pain of both shoulders  Other low back pain  Pain in right hip  Rationale for Evaluation and Treatment: Rehabilitation  ONSET DATE: 03/17/24  SUBJECTIVE:   SUBJECTIVE STATEMENT: Pt reported he I getting fit for an AFO in the coming month as recommended by his doctor. Pt reported he feels good today with no pain upon arrival of PT today.    Reports falls years ago that caused shoulder issues, has arthritis and RC tearing, degenerative changes in the hip and back.  REports that he has put up with a lot and ignored a lot, reports that recently he has had a lot of difficulty walking, with stairs, reaching and lifting, standing.  PERTINENT HISTORY: Depression, HTN, sleep apnea, ankle surgery, CT release, IM nail right tibia PAIN:  Are you having pain? Yes: NPRS scale: 4/10  Pain location: low back Pain description: dull ache Aggravating factors: walking, standing stairs, reaching, lifting pain up to 9/10 Relieving factors: rest, inactivity, OTC pain meds 0/10  PRECAUTIONS: Fall  RED FLAGS: None  WEIGHT BEARING RESTRICTIONS: No  FALLS:  Has patient fallen in last 6 months? Yes. Number of falls 2  LIVING ENVIRONMENT: Lives with: lives with their family Lives in: House/apartment Stairs: 3 into the home with handrail Has following equipment at home: Single point cane  OCCUPATION: retired  PLOF: Independent and some house work, some yard work  PATIENT GOALS: better balance, less pain, tolerate activity  NEXT MD VISIT: unsure  OBJECTIVE:  Note: Objective measures were completed at Evaluation unless otherwise noted.  DIAGNOSTIC FINDINGS:  IMPRESSION: 1. No fracture or dislocation of  the shoulders. 2. High riding position of the glenohumeral joints, right-greater-than-left, with mild subacromial pseudo arthrosis on the right. Findings are consistent with chronic rotator cuff tear. 3. Moderate bilateral acromioclavicular arthrosis and mild bilateral glenohumeral arthrosis.  IMPRESSION: 1. Minimal right hip degenerative changes. 2. Lower lumbar spine degenerative changes.  COGNITION: Overall cognitive status: Within functional limits for tasks assessed     SENSATION: WFL  MUSCLE LENGTH: Very tight HS and piriformis  POSTURE: rounded shoulders, forward head, and decreased lumbar lordosis  PALPATION: Mild tenderness in the right lateral hip, the low back and the shoulders  LOWER EXTREMITY ROM:  Active ROM Right eval Left eval  Hip flexion 60   Hip extension    Hip abduction 10   Hip adduction    Hip internal rotation    Hip external rotation    Knee flexion    Knee extension    Ankle dorsiflexion    Ankle plantarflexion    Ankle inversion    Ankle eversion     (Blank rows = not tested)  LOWER EXTREMITY MMT:  MMT Right eval Left eval  Hip flexion 4- 4  Hip extension    Hip abduction 4- 4  Hip adduction    Hip internal rotation    Hip external rotation    Knee flexion    Knee extension    Ankle dorsiflexion 4- 0  Ankle plantarflexion  3+  Ankle inversion  3+  Ankle eversion  3+   (Blank rows = not tested)  Cannot stand on the right leg due to weakness SHOULDER AROM:   Flexion R/L 140, abduction 120 bilaterally, ER 40, IR 40 some pain with all  SHOULDER MMT:  4-/5 ER/IR, FLexion/abduction 3+/5   Shoulder tests:  pain with empty can, weak  LUMBAR ROM:   decreased 50% with all motions with c/o tightness  FUNCTIONAL TESTS:  5 times sit to stand: 26 seconds Timed up and go (TUG): 20 seconds BERG: 36/56    GAIT: Distance walked: 60 feet Assistive device utilized: has a cane and an AFO for the left ankle and None Level of  assistance: SBA Comments: left drop foot, with exagerrated left hip flexion and knee flexion to clear foot  TREATMENT DATE:  05/19/24 NuStep L5 x  Reassess BERG Step Ups 2x10 4 STS on airex 2x10  Lateral band walking Rtband  Glute Bridges 2x10 SLR 2x10  Lateral Step Ups 4 2x10   05/11/24 Nustep level 4 x 6 minutes HS curls 25# UBE level 3 x 4 minutes 5# leg ext 5# straight arm pulls 15# rows Demo of resisted hip extension and abduction 20# lats Feet on ball K2C, rotation, small bridge, isometric abs Passive stretch LE's  05/04/24 NuStep L 5 x 6 min TUG 13.78 sec HS curls 35lb 2x10 Leg Ext 10lb 2x10 6in step ups x10 each 6in lateal step ups x10 each 1 rail Leg press 40lb 3x10 Shoulder Ext 10lb 2x10 standing on airex Sit to stand 2x10  04/27/24 NuStep L 5 x 6 min 20lb resisted side steps x5 each  6in step ups some UE assist x10 each HS curls 25lb 2x15 Leg Ext 10lb 2x15 Sit to stand 2x10  Does extend LE prematurely  Shoulder Ext 10lb 2x10  04/12/24 NuStep L5x74min 3 Way hip (flex/abd/ext) red resistance band  Hamstring Curls 25# 2x10 Leg ext 15# 2x10 Tricep Pulldowns 20# 2x10 Seated Row 30# 2x10 Lateral Raises 3# 2x10 STS with overhead press  04/08/24 Evaluation, HEP, POC    PATIENT EDUCATION:  Education details: HEP/POC Person educated: Patient Education method: Programmer, Multimedia, Facilities Manager, Verbal cues, and Handouts Education comprehension: verbalized understanding  HOME EXERCISE PROGRAM: Access Code: [email or web address] URL: [email or web address] Date: 04/08/2024 Prepared by: Ozell Mainland  Exercises - Standing Tandem Balance with Counter Support  - 1 x daily - 7 x weekly - 1 sets - 10 reps - 10 hold - Standing Hip Abduction with Counter Support  - 2 x daily - 7 x weekly - 1 sets - 10 reps - 3 hold -  Standing March with Counter Support  - 2 x daily - 7 x weekly - 1 sets - 10 reps - 3 hold - Standing Hip Extension with Counter Support  - 2 x daily - 7 x weekly - 1 sets - 10 reps - 3 hold - Heel Raises with Counter Support  - 2 x daily - 7 x weekly - 1 sets - 10 reps - 3 hold  ASSESSMENT:  CLINICAL IMPRESSION:  Pt exhibits deficits in dynamic and static balance activities tends to shuffle feet when fatigued; pt required CGA for such activities in PT today. Pt will benefit from continued LE strengthening along with core activation activities. Focus on balance in order to increase gait quality and promote fall prevention strategies.     Patient is a 73 y.o. male who was seen today for physical therapy evaluation and treatment for LBP, hip pain and shoulder pain.  Biggest issue is poor balance, with the testing above he is at a very high risk for falls.   Has a drop foot on the left.    OBJECTIVE IMPAIRMENTS: Abnormal gait, cardiopulmonary status limiting activity, decreased activity tolerance, decreased balance, decreased coordination, decreased endurance, decreased mobility, difficulty walking, decreased ROM, decreased strength, increased fascial restrictions, increased muscle spasms, impaired flexibility, improper body mechanics, postural dysfunction, and pain.   REHAB POTENTIAL: Good  CLINICAL DECISION MAKING: Evolving/moderate complexity  EVALUATION COMPLEXITY: Moderate   GOALS: Goals reviewed with patient? Yes  SHORT TERM GOALS: Target date: 04/29/24 Independent with initial HEP Baseline: Goal status: ONGOING Performing 50% 05/19/24  LONG TERM GOALS: Target date: 07/09/24  Independent with advanced HEP Baseline:  Goal status: IN PROGRESS 05/19/24  2.  Understand  posture and body mechanics Baseline:  Goal status: IN PROGRESS 05/19/24  3.  Improve TUG to 14 seconds Baseline: 20 seconds Goal status: Met 05/04/24  4.  Improve Berg balance to 45/56 Baseline: 36/56 Goal  status: IN PROGRESS 41/56 05/19/24  5.  Decrease pain with shopping 50% Baseline: pain up to 8-9/10 with shopping at [employer] Goal status: still unable to shop for prolonged time w/o pain increase 04/12/24, 75% if he carries a cane 05/04/24  PLAN:  PT FREQUENCY: 1-2x/week  PT DURATION: 12 weeks  PLANNED INTERVENTIONS: 97164- PT Re-evaluation, 97110-Therapeutic exercises, 97530- Therapeutic activity, 97112- Neuromuscular re-education, 97535- Self Care, 02859- Manual therapy, [id number]- Gait training, [phone]- Splinting, H9716- Electrical stimulation (unattended), 97016- Vasopneumatic device, [id number]- Ultrasound, 02987- Traction (mechanical), [id number]- Ionotophoresis 4mg /ml Dexamethasone , Patient/Family education, Balance training, Stair training, Taping, Joint mobilization, Cryotherapy, and Moist heat  PLAN FOR NEXT SESSION: may see a few more visits to educate on the gym activities   Thersia Alder, Student-PT 05/19/2024, 8:05 AM  "

## 2024-05-23 ENCOUNTER — Other Ambulatory Visit: Payer: Self-pay | Admitting: Family Medicine

## 2024-05-23 DIAGNOSIS — E039 Hypothyroidism, unspecified: Secondary | ICD-10-CM

## 2024-05-24 ENCOUNTER — Other Ambulatory Visit: Payer: Self-pay

## 2024-05-24 MED ORDER — LEVOTHYROXINE SODIUM 50 MCG PO TABS
50.0000 ug | ORAL_TABLET | Freq: Every day | ORAL | 1 refills | Status: AC
  Filled 2024-05-24: qty 60, 60d supply, fill #0

## 2024-05-25 ENCOUNTER — Other Ambulatory Visit: Payer: Self-pay

## 2024-05-31 ENCOUNTER — Ambulatory Visit: Attending: Family Medicine

## 2024-05-31 DIAGNOSIS — M25511 Pain in right shoulder: Secondary | ICD-10-CM | POA: Diagnosis present

## 2024-05-31 DIAGNOSIS — M5459 Other low back pain: Secondary | ICD-10-CM | POA: Insufficient documentation

## 2024-05-31 DIAGNOSIS — M25551 Pain in right hip: Secondary | ICD-10-CM | POA: Insufficient documentation

## 2024-05-31 DIAGNOSIS — M25512 Pain in left shoulder: Secondary | ICD-10-CM | POA: Diagnosis present

## 2024-05-31 DIAGNOSIS — G8929 Other chronic pain: Secondary | ICD-10-CM | POA: Insufficient documentation

## 2024-05-31 NOTE — Therapy (Signed)
 " OUTPATIENT PHYSICAL THERAPY LOWER EXTREMITY TREATMENT   Patient Name: [name] [name] MRN: [id number] DOB:09-13-1950, 74 y.o., male Today's Date: 05/31/2024  END OF SESSION:  PT End of Session - 05/31/24 0801     Visit Number 7    Date for Recertification  07/09/24    Authorization Type HTA    PT Start Time 0800    PT Stop Time 0845    PT Time Calculation (min) 45 min    Activity Tolerance Patient tolerated treatment well    Behavior During Therapy [facility] for tasks assessed/performed          Past Medical History:  Diagnosis Date   Cardiomegaly    Colon polyp    Depression    Fatty liver disease, nonalcoholic    Gallstones    Hypertension    Hypogonadism male    Mixed hyperlipidemia    Obesity    Pancreas (digestive gland) works poorly    Prediabetes    Sleep apnea    Testosterone  deficiency 08/03/2009   Thyroid  disease    Vitamin D  deficiency    Past Surgical History:  Procedure Laterality Date   ANKLE FRACTURE SURGERY Left 1986   APPENDECTOMY  1964   CARPAL TUNNEL RELEASE Bilateral 1996   CHOLECYSTECTOMY  1976   COLONOSCOPY     POLYPECTOMY     TIBIA IM NAIL INSERTION Right 03/21/2016   Procedure: INTRAMEDULLARY (IM) NAIL TIBIAL;  Surgeon: Evalene JONETTA Chancy, MD;  Location: [facility] OR;  Service: Orthopedics;  Laterality: Right;   Patient Active Problem List   Diagnosis Date Noted   Chronic pain of both shoulders 03/11/2024   Right hip pain 03/11/2024   At increased risk of exposure to COVID-19 virus 09/09/2023   Recurrent dry cough 08/29/2021   Multiple fractures of ribs, left side, initial encounter for closed fracture 10/04/2020   Fatty liver disease, nonalcoholic 07/15/2018   Abnormal glucose 07/13/2018   Erectile dysfunction 09/12/2014   MDD (major depressive disorder), recurrent, in partial remission 09/12/2014   Myositis 12/31/2013   Medication management 12/31/2013   Hypothyroidism 12/31/2013   Left foot drop 08/30/2013   Vitamin D  deficiency     Hyperlipidemia 08/04/2009   Morbid obesity (HCC) - BMI 30+ with OSA 08/03/2009   Essential hypertension 08/01/2009   Obstructive sleep apnea 08/01/2009    PCP: CANDIE Ku, NP  REFERRING PROVIDER: Joane, MD  REFERRING DIAG: hip pain, shoulder pain and LBP  THERAPY DIAG:  Chronic pain of both shoulders  Pain in right hip  Other low back pain  Rationale for Evaluation and Treatment: Rehabilitation  ONSET DATE: 03/17/24  SUBJECTIVE:   SUBJECTIVE STATEMENT: I am doing pretty good. The hip has not bothered me lately, my shoulder have been good. Will go for AFO on January 14th.    Reports falls years ago that caused shoulder issues, has arthritis and RC tearing, degenerative changes in the hip and back.  REports that he has put up with a lot and ignored a lot, reports that recently he has had a lot of difficulty walking, with stairs, reaching and lifting, standing.  PERTINENT HISTORY: Depression, HTN, sleep apnea, ankle surgery, CT release, IM nail right tibia PAIN:  Are you having pain? Yes: NPRS scale: 4/10  Pain location: low back Pain description: dull ache Aggravating factors: walking, standing stairs, reaching, lifting pain up to 9/10 Relieving factors: rest, inactivity, OTC pain meds 0/10  PRECAUTIONS: Fall  RED FLAGS: None   WEIGHT BEARING RESTRICTIONS: No  FALLS:  Has patient fallen in last 6 months? Yes. Number of falls 2  LIVING ENVIRONMENT: Lives with: lives with their family Lives in: House/apartment Stairs: 3 into the home with handrail Has following equipment at home: Single point cane  OCCUPATION: retired  PLOF: Independent and some house work, some yard work  PATIENT GOALS: better balance, less pain, tolerate activity  NEXT MD VISIT: unsure  OBJECTIVE:  Note: Objective measures were completed at Evaluation unless otherwise noted.  DIAGNOSTIC FINDINGS:  IMPRESSION: 1. No fracture or dislocation of the shoulders. 2. High riding position  of the glenohumeral joints, right-greater-than-left, with mild subacromial pseudo arthrosis on the right. Findings are consistent with chronic rotator cuff tear. 3. Moderate bilateral acromioclavicular arthrosis and mild bilateral glenohumeral arthrosis.  IMPRESSION: 1. Minimal right hip degenerative changes. 2. Lower lumbar spine degenerative changes.  COGNITION: Overall cognitive status: Within functional limits for tasks assessed     SENSATION: WFL  MUSCLE LENGTH: Very tight HS and piriformis  POSTURE: rounded shoulders, forward head, and decreased lumbar lordosis  PALPATION: Mild tenderness in the right lateral hip, the low back and the shoulders  LOWER EXTREMITY ROM:  Active ROM Right eval Left eval  Hip flexion 60   Hip extension    Hip abduction 10   Hip adduction    Hip internal rotation    Hip external rotation    Knee flexion    Knee extension    Ankle dorsiflexion    Ankle plantarflexion    Ankle inversion    Ankle eversion     (Blank rows = not tested)  LOWER EXTREMITY MMT:  MMT Right eval Left eval  Hip flexion 4- 4  Hip extension    Hip abduction 4- 4  Hip adduction    Hip internal rotation    Hip external rotation    Knee flexion    Knee extension    Ankle dorsiflexion 4- 0  Ankle plantarflexion  3+  Ankle inversion  3+  Ankle eversion  3+   (Blank rows = not tested)  Cannot stand on the right leg due to weakness SHOULDER AROM:   Flexion R/L 140, abduction 120 bilaterally, ER 40, IR 40 some pain with all  SHOULDER MMT:  4-/5 ER/IR, FLexion/abduction 3+/5   Shoulder tests:  pain with empty can, weak  LUMBAR ROM:   decreased 50% with all motions with c/o tightness  FUNCTIONAL TESTS:  5 times sit to stand: 26 seconds Timed up and go (TUG): 20 seconds BERG: 36/56    GAIT: Distance walked: 60 feet Assistive device utilized: has a cane and an AFO for the left ankle and None Level of assistance: SBA Comments: left drop foot,  with exagerrated left hip flexion and knee flexion to clear foot  TREATMENT DATE:  05/31/24 NuStep L5x82mins Leg ext 10# 2x10 HS curls 45# 2x10 Resisted gait 30# 4 way x4  STS on airex 2x10 Step ups 6 Walking on beam  SLS and tandem on beam  [place] taps on airex    05/19/24 NuStep L5 x  Reassess BERG Step Ups 2x10 4 STS on airex 2x10  Lateral band walking Rtband  Glute Bridges 2x10 SLR 2x10  Lateral Step Ups 4 2x10   05/11/24 Nustep level 4 x 6 minutes HS curls 25# UBE level 3 x 4 minutes 5# leg ext 5# straight arm pulls 15# rows Demo of resisted hip extension and abduction 20# lats Feet on ball K2C, rotation, small bridge, isometric abs Passive stretch LE's  05/04/24 NuStep L 5 x 6 min TUG 13.78 sec HS curls 35lb 2x10 Leg Ext 10lb 2x10 6in step ups x10 each 6in lateal step ups x10 each 1 rail Leg press 40lb 3x10 Shoulder Ext 10lb 2x10 standing on airex Sit to stand 2x10  04/27/24 NuStep L 5 x 6 min 20lb resisted side steps x5 each  6in step ups some UE assist x10 each HS curls 25lb 2x15 Leg Ext 10lb 2x15 Sit to stand 2x10  Does extend LE prematurely  Shoulder Ext 10lb 2x10  04/12/24 NuStep L5x3min 3 Way hip (flex/abd/ext) red resistance band  Hamstring Curls 25# 2x10 Leg ext 15# 2x10 Tricep Pulldowns 20# 2x10 Seated Row 30# 2x10 Lateral Raises 3# 2x10 STS with overhead press  04/08/24 Evaluation, HEP, POC    PATIENT EDUCATION:  Education details: HEP/POC Person educated: Patient Education method: Programmer, Multimedia, Facilities Manager, Verbal cues, and Handouts Education comprehension: verbalized understanding  HOME EXERCISE PROGRAM: Access Code: [email or web address] URL: [email or web address] Date: 04/08/2024 Prepared by: Ozell Mainland  Exercises - Standing Tandem Balance with Counter Support  - 1 x daily - 7 x  weekly - 1 sets - 10 reps - 10 hold - Standing Hip Abduction with Counter Support  - 2 x daily - 7 x weekly - 1 sets - 10 reps - 3 hold - Standing March with Counter Support  - 2 x daily - 7 x weekly - 1 sets - 10 reps - 3 hold - Standing Hip Extension with Counter Support  - 2 x daily - 7 x weekly - 1 sets - 10 reps - 3 hold - Heel Raises with Counter Support  - 2 x daily - 7 x weekly - 1 sets - 10 reps - 3 hold  ASSESSMENT:  CLINICAL IMPRESSION: Patient is doing well, reports no pain. Pt still has some deficits in dynamic and static balance activities, requires CGA-minA with these activities. He tends to shuffle his feet when fatigued; reports he takes his cane out if walking a lot and shopping. Pt will benefit from PT to focus on balance in order to increase gait quality and promote fall prevention strategies. Was advised to make some more appointments to keep working on balance and also to try to get some practice in with the AFO once he gets it.    Patient is a 74 y.o. male who was seen today for physical therapy evaluation and treatment for LBP, hip pain and shoulder pain.  Biggest issue is poor balance, with the testing above he is at a very high risk for falls.   Has a drop foot on the left.    OBJECTIVE IMPAIRMENTS: Abnormal gait, cardiopulmonary status limiting activity, decreased activity tolerance, decreased balance, decreased coordination, decreased endurance, decreased mobility, difficulty walking, decreased ROM,  decreased strength, increased fascial restrictions, increased muscle spasms, impaired flexibility, improper body mechanics, postural dysfunction, and pain.   REHAB POTENTIAL: Good  CLINICAL DECISION MAKING: Evolving/moderate complexity  EVALUATION COMPLEXITY: Moderate   GOALS: Goals reviewed with patient? Yes  SHORT TERM GOALS: Target date: 04/29/24 Independent with initial HEP Baseline: Goal status: ONGOING Performing 50% 05/19/24, MET 05/31/24  LONG TERM GOALS:  Target date: 07/09/24  Independent with advanced HEP Baseline:  Goal status: IN PROGRESS 05/19/24  2.  Understand posture and body mechanics Baseline:  Goal status: IN PROGRESS 05/19/24  3.  Improve TUG to 14 seconds Baseline: 20 seconds Goal status: Met 05/04/24  4.  Improve Berg balance to 45/56 Baseline: 36/56 Goal status: IN PROGRESS 41/56 05/19/24  5.  Decrease pain with shopping 50% Baseline: pain up to 8-9/10 with shopping at [employer] Goal status: still unable to shop for prolonged time w/o pain increase 04/12/24, 75% if he carries a cane 05/04/24, MET 05/31/24  PLAN:  PT FREQUENCY: 1-2x/week  PT DURATION: 12 weeks  PLANNED INTERVENTIONS: 97164- PT Re-evaluation, 97110-Therapeutic exercises, 97530- Therapeutic activity, 97112- Neuromuscular re-education, 97535- Self Care, 02859- Manual therapy, [id number]- Gait training, [phone]- Splinting, H9716- Electrical stimulation (unattended), 97016- Vasopneumatic device, [id number]- Ultrasound, 02987- Traction (mechanical), [id number]- Ionotophoresis 4mg /ml Dexamethasone , Patient/Family education, Balance training, Stair training, Taping, Joint mobilization, Cryotherapy, and Moist heat  PLAN FOR NEXT SESSION: may see a few more visits to educate on the gym activities, work on balance and practice with AFO   Almetta Fam, PT 05/31/2024, 8:45 AM  "

## 2024-06-07 ENCOUNTER — Ambulatory Visit

## 2024-06-07 DIAGNOSIS — G8929 Other chronic pain: Secondary | ICD-10-CM

## 2024-06-07 DIAGNOSIS — M5459 Other low back pain: Secondary | ICD-10-CM

## 2024-06-07 DIAGNOSIS — M25551 Pain in right hip: Secondary | ICD-10-CM

## 2024-06-07 DIAGNOSIS — M25511 Pain in right shoulder: Secondary | ICD-10-CM | POA: Diagnosis not present

## 2024-06-07 NOTE — Therapy (Signed)
 " OUTPATIENT PHYSICAL THERAPY LOWER EXTREMITY TREATMENT   Patient Name: [name] [name] MRN: [id number] DOB:[birth year], 74 y.o., male Today's Date: 06/07/2024  END OF SESSION:  PT End of Session - 06/07/24 0807     Visit Number 8    Date for Recertification  07/09/24    Authorization Type HTA    PT Start Time 0803    PT Stop Time 0845    PT Time Calculation (min) 42 min    Activity Tolerance Patient tolerated treatment well    Behavior During Therapy [facility] for tasks assessed/performed         Past Medical History:  Diagnosis Date   Cardiomegaly    Colon polyp    Depression    Fatty liver disease, nonalcoholic    Gallstones    Hypertension    Hypogonadism male    Mixed hyperlipidemia    Obesity    Pancreas (digestive gland) works poorly    Prediabetes    Sleep apnea    Testosterone  deficiency 08/03/2009   Thyroid  disease    Vitamin D  deficiency    Past Surgical History:  Procedure Laterality Date   ANKLE FRACTURE SURGERY Left 1986   APPENDECTOMY  1964   CARPAL TUNNEL RELEASE Bilateral 1996   CHOLECYSTECTOMY  1976   COLONOSCOPY     POLYPECTOMY     TIBIA IM NAIL INSERTION Right 03/21/2016   Procedure: INTRAMEDULLARY (IM) NAIL TIBIAL;  Surgeon: Evalene JONETTA Chancy, MD;  Location: [facility] OR;  Service: Orthopedics;  Laterality: Right;   Patient Active Problem List   Diagnosis Date Noted   Chronic pain of both shoulders 03/11/2024   Right hip pain 03/11/2024   At increased risk of exposure to COVID-19 virus 09/09/2023   Recurrent dry cough 08/29/2021   Multiple fractures of ribs, left side, initial encounter for closed fracture 10/04/2020   Fatty liver disease, nonalcoholic 07/15/2018   Abnormal glucose 07/13/2018   Erectile dysfunction 09/12/2014   MDD (major depressive disorder), recurrent, in partial remission 09/12/2014   Myositis 12/31/2013   Medication management 12/31/2013   Hypothyroidism 12/31/2013   Left foot drop 08/30/2013   Vitamin D  deficiency     Hyperlipidemia 08/04/2009   Morbid obesity (HCC) - BMI 30+ with OSA 08/03/2009   Essential hypertension 08/01/2009   Obstructive sleep apnea 08/01/2009    PCP: CANDIE Ku, NP  REFERRING PROVIDER: Joane, MD  REFERRING DIAG: hip pain, shoulder pain and LBP  THERAPY DIAG:  Chronic pain of both shoulders  Pain in right hip  Other low back pain  Rationale for Evaluation and Treatment: Rehabilitation  ONSET DATE: 03/17/24  SUBJECTIVE:   SUBJECTIVE STATEMENT: Pt reported no pain upon arrival of PT just a little low back soreness from siting on the floor the other day and says he feels good today.    Reports falls years ago that caused shoulder issues, has arthritis and RC tearing, degenerative changes in the hip and back.  REports that he has put up with a lot and ignored a lot, reports that recently he has had a lot of difficulty walking, with stairs, reaching and lifting, standing.  PERTINENT HISTORY: Depression, HTN, sleep apnea, ankle surgery, CT release, IM nail right tibia PAIN:  Are you having pain? Yes: NPRS scale: 4/10  Pain location: low back Pain description: dull ache Aggravating factors: walking, standing stairs, reaching, lifting pain up to 9/10 Relieving factors: rest, inactivity, OTC pain meds 0/10  PRECAUTIONS: Fall  RED FLAGS: None   WEIGHT BEARING  RESTRICTIONS: No  FALLS:  Has patient fallen in last 6 months? Yes. Number of falls 2  LIVING ENVIRONMENT: Lives with: lives with their family Lives in: House/apartment Stairs: 3 into the home with handrail Has following equipment at home: Single point cane  OCCUPATION: retired  PLOF: Independent and some house work, some yard work  PATIENT GOALS: better balance, less pain, tolerate activity  NEXT MD VISIT: unsure  OBJECTIVE:  Note: Objective measures were completed at Evaluation unless otherwise noted.  DIAGNOSTIC FINDINGS:  IMPRESSION: 1. No fracture or dislocation of the shoulders. 2.  High riding position of the glenohumeral joints, right-greater-than-left, with mild subacromial pseudo arthrosis on the right. Findings are consistent with chronic rotator cuff tear. 3. Moderate bilateral acromioclavicular arthrosis and mild bilateral glenohumeral arthrosis.  IMPRESSION: 1. Minimal right hip degenerative changes. 2. Lower lumbar spine degenerative changes.  COGNITION: Overall cognitive status: Within functional limits for tasks assessed     SENSATION: WFL  MUSCLE LENGTH: Very tight HS and piriformis  POSTURE: rounded shoulders, forward head, and decreased lumbar lordosis  PALPATION: Mild tenderness in the right lateral hip, the low back and the shoulders  LOWER EXTREMITY ROM:  Active ROM Right eval Left eval  Hip flexion 60   Hip extension    Hip abduction 10   Hip adduction    Hip internal rotation    Hip external rotation    Knee flexion    Knee extension    Ankle dorsiflexion    Ankle plantarflexion    Ankle inversion    Ankle eversion     (Blank rows = not tested)  LOWER EXTREMITY MMT:  MMT Right eval Left eval  Hip flexion 4- 4  Hip extension    Hip abduction 4- 4  Hip adduction    Hip internal rotation    Hip external rotation    Knee flexion    Knee extension    Ankle dorsiflexion 4- 0  Ankle plantarflexion  3+  Ankle inversion  3+  Ankle eversion  3+   (Blank rows = not tested)  Cannot stand on the right leg due to weakness SHOULDER AROM:   Flexion R/L 140, abduction 120 bilaterally, ER 40, IR 40 some pain with all  SHOULDER MMT:  4-/5 ER/IR, FLexion/abduction 3+/5   Shoulder tests:  pain with empty can, weak  LUMBAR ROM:   decreased 50% with all motions with c/o tightness  FUNCTIONAL TESTS:  5 times sit to stand: 26 seconds Timed up and go (TUG): 20 seconds BERG: 36/56    GAIT: Distance walked: 60 feet Assistive device utilized: has a cane and an AFO for the left ankle and None Level of assistance:  SBA Comments: left drop foot, with exagerrated left hip flexion and knee flexion to clear foot  TREATMENT DATE:  06/07/24 NuStep L5 x  BERG reassessment  Step Ups form aires 6 2x10 Lateral side steps airex Standing hip abduction 2x10 2.5#  STS w/ OHP yellow weighted ball Lumbar roatation  SB Rollouts   05/31/24 NuStep L5x84mins Leg ext 10# 2x10 HS curls 45# 2x10 Resisted gait 30# 4 way x4  STS on airex 2x10 Step ups 6 Walking on beam  SLS and tandem on beam  Cone taps on airex    05/19/24 NuStep L5 x  Reassess BERG Step Ups 2x10 4 STS on airex 2x10  Lateral band walking Rtband  Glute Bridges 2x10 SLR 2x10  Lateral Step Ups 4 2x10   05/11/24 Nustep level 4 x 6 minutes HS curls 25# UBE level 3 x 4 minutes 5# leg ext 5# straight arm pulls 15# rows Demo of resisted hip extension and abduction 20# lats Feet on ball K2C, rotation, small bridge, isometric abs Passive stretch LE's  05/04/24 NuStep L 5 x 6 min TUG 13.78 sec HS curls 35lb 2x10 Leg Ext 10lb 2x10 6in step ups x10 each 6in lateal step ups x10 each 1 rail Leg press 40lb 3x10 Shoulder Ext 10lb 2x10 standing on airex Sit to stand 2x10  04/27/24 NuStep L 5 x 6 min 20lb resisted side steps x5 each  6in step ups some UE assist x10 each HS curls 25lb 2x15 Leg Ext 10lb 2x15 Sit to stand 2x10  Does extend LE prematurely  Shoulder Ext 10lb 2x10  04/12/24 NuStep L5x94min 3 Way hip (flex/abd/ext) red resistance band  Hamstring Curls 25# 2x10 Leg ext 15# 2x10 Tricep Pulldowns 20# 2x10 Seated Row 30# 2x10 Lateral Raises 3# 2x10 STS with overhead press  04/08/24 Evaluation, HEP, POC    PATIENT EDUCATION:  Education details: HEP/POC Person educated: Patient Education method: Programmer, Multimedia, Facilities Manager, Verbal cues, and Handouts Education comprehension:  verbalized understanding  HOME EXERCISE PROGRAM: Access Code: [email or web address] URL: [email or web address] Date: 04/08/2024 Prepared by: Ozell Mainland  Exercises - Standing Tandem Balance with Counter Support  - 1 x daily - 7 x weekly - 1 sets - 10 reps - 10 hold - Standing Hip Abduction with Counter Support  - 2 x daily - 7 x weekly - 1 sets - 10 reps - 3 hold - Standing March with Counter Support  - 2 x daily - 7 x weekly - 1 sets - 10 reps - 3 hold - Standing Hip Extension with Counter Support  - 2 x daily - 7 x weekly - 1 sets - 10 reps - 3 hold - Heel Raises with Counter Support  - 2 x daily - 7 x weekly - 1 sets - 10 reps - 3 hold  ASSESSMENT:  CLINICAL IMPRESSION: Patient is doing well today and reports no pain upon arrival just some low back soreness. Pt tends to sway backward if LOB occurs; cued to plant big toe down to floor. Overall pt balance improving with ability to increase BERG score today. Pt still has some deficits in static balance activities like SL stance and requires CGA to minA with these activities. Pt will benefit from PT to focus on balance in order to increase gait quality and promote fall prevention strategies.   Patient is a 74 y.o. male who was seen today for physical therapy evaluation and treatment for LBP, hip pain and shoulder pain.  Biggest issue is poor balance, with the testing above he is at a very high risk for falls.   Has a drop foot  on the left.    OBJECTIVE IMPAIRMENTS: Abnormal gait, cardiopulmonary status limiting activity, decreased activity tolerance, decreased balance, decreased coordination, decreased endurance, decreased mobility, difficulty walking, decreased ROM, decreased strength, increased fascial restrictions, increased muscle spasms, impaired flexibility, improper body mechanics, postural dysfunction, and pain.   REHAB POTENTIAL: Good  CLINICAL DECISION MAKING: Evolving/moderate complexity  EVALUATION COMPLEXITY:  Moderate   GOALS: Goals reviewed with patient? Yes  SHORT TERM GOALS: Target date: 04/29/24 Independent with initial HEP Baseline: Goal status: ONGOING Performing 50% 05/19/24, MET 05/31/24  LONG TERM GOALS: Target date: 07/09/24  Independent with advanced HEP Baseline:  Goal status: IN PROGRESS 06/07/24  2.  Understand posture and body mechanics Baseline:  Goal status: IN PROGRESS 06/07/24  3.  Improve TUG to 14 seconds Baseline: 20 seconds Goal status: Met 05/04/24  4.  Improve Berg balance to 45/56 Baseline: 36/56 Goal status: IN PROGRESS 41/56 05/19/24; MET 45/56 06/07/24  5.  Decrease pain with shopping 50% Baseline: pain up to 8-9/10 with shopping at [employer] Goal status: still unable to shop for prolonged time w/o pain increase 04/12/24, 75% if he carries a cane 05/04/24, MET 05/31/24   PLAN:  PT FREQUENCY: 1-2x/week  PT DURATION: 12 weeks  PLANNED INTERVENTIONS: 97164- PT Re-evaluation, 97110-Therapeutic exercises, 97530- Therapeutic activity, 97112- Neuromuscular re-education, 97535- Self Care, 02859- Manual therapy, [id number]- Gait training, [phone]- Splinting, [id number]- Electrical stimulation (unattended), 97016- Vasopneumatic device, [id number]- Ultrasound, 02987- Traction (mechanical), [id number]- Ionotophoresis 4mg /ml Dexamethasone , Patient/Family education, Balance training, Stair training, Taping, Joint mobilization, Cryotherapy, and Moist heat  PLAN FOR NEXT SESSION: may see a few more visits to educate on the gym activities, work on balance and practice with AFO   Thersia Alder, Student-PT 06/07/2024, 8:51 AM  "

## 2024-06-10 ENCOUNTER — Telehealth: Payer: Self-pay

## 2024-06-10 ENCOUNTER — Ambulatory Visit: Admitting: Family Medicine

## 2024-06-10 ENCOUNTER — Encounter: Payer: Self-pay | Admitting: Family Medicine

## 2024-06-10 VITALS — BP 126/78 | HR 56 | Temp 97.8°F | Ht 67.5 in | Wt 215.2 lb

## 2024-06-10 DIAGNOSIS — Z125 Encounter for screening for malignant neoplasm of prostate: Secondary | ICD-10-CM

## 2024-06-10 DIAGNOSIS — G4733 Obstructive sleep apnea (adult) (pediatric): Secondary | ICD-10-CM | POA: Diagnosis not present

## 2024-06-10 DIAGNOSIS — Z0001 Encounter for general adult medical examination with abnormal findings: Secondary | ICD-10-CM | POA: Diagnosis not present

## 2024-06-10 DIAGNOSIS — E782 Mixed hyperlipidemia: Secondary | ICD-10-CM

## 2024-06-10 DIAGNOSIS — M25551 Pain in right hip: Secondary | ICD-10-CM | POA: Diagnosis not present

## 2024-06-10 DIAGNOSIS — I10 Essential (primary) hypertension: Secondary | ICD-10-CM | POA: Diagnosis not present

## 2024-06-10 DIAGNOSIS — E559 Vitamin D deficiency, unspecified: Secondary | ICD-10-CM

## 2024-06-10 DIAGNOSIS — M609 Myositis, unspecified: Secondary | ICD-10-CM

## 2024-06-10 DIAGNOSIS — M6089 Other myositis, multiple sites: Secondary | ICD-10-CM

## 2024-06-10 DIAGNOSIS — G2581 Restless legs syndrome: Secondary | ICD-10-CM

## 2024-06-10 DIAGNOSIS — N529 Male erectile dysfunction, unspecified: Secondary | ICD-10-CM

## 2024-06-10 DIAGNOSIS — F3341 Major depressive disorder, recurrent, in partial remission: Secondary | ICD-10-CM

## 2024-06-10 DIAGNOSIS — R7309 Other abnormal glucose: Secondary | ICD-10-CM | POA: Diagnosis not present

## 2024-06-10 DIAGNOSIS — Z0000 Encounter for general adult medical examination without abnormal findings: Secondary | ICD-10-CM

## 2024-06-10 DIAGNOSIS — E039 Hypothyroidism, unspecified: Secondary | ICD-10-CM

## 2024-06-10 DIAGNOSIS — R252 Cramp and spasm: Secondary | ICD-10-CM

## 2024-06-10 LAB — CBC WITH DIFFERENTIAL/PLATELET
Basophils Absolute: 0.1 K/uL (ref 0.0–0.1)
Basophils Relative: 1.4 % (ref 0.0–3.0)
Eosinophils Absolute: 0.2 K/uL (ref 0.0–0.7)
Eosinophils Relative: 5.4 % — ABNORMAL HIGH (ref 0.0–5.0)
HCT: 37.1 % — ABNORMAL LOW (ref 39.0–52.0)
Hemoglobin: 12.1 g/dL — ABNORMAL LOW (ref 13.0–17.0)
Lymphocytes Relative: 31.5 % (ref 12.0–46.0)
Lymphs Abs: 1.2 K/uL (ref 0.7–4.0)
MCHC: 32.7 g/dL (ref 30.0–36.0)
MCV: 78.8 fl (ref 78.0–100.0)
Monocytes Absolute: 0.6 K/uL (ref 0.1–1.0)
Monocytes Relative: 17.1 % — ABNORMAL HIGH (ref 3.0–12.0)
Neutro Abs: 1.7 K/uL (ref 1.4–7.7)
Neutrophils Relative %: 44.6 % (ref 43.0–77.0)
Platelets: 243 K/uL (ref 150.0–400.0)
RBC: 4.71 Mil/uL (ref 4.22–5.81)
RDW: 16.4 % — ABNORMAL HIGH (ref 11.5–15.5)
WBC: 3.7 K/uL — ABNORMAL LOW (ref 4.0–10.5)

## 2024-06-10 LAB — LIPID PANEL
Cholesterol: 164 mg/dL (ref 28–200)
HDL: 30.2 mg/dL — ABNORMAL LOW
LDL Cholesterol: 84 mg/dL (ref 10–99)
NonHDL: 134.27
Total CHOL/HDL Ratio: 5
Triglycerides: 249 mg/dL — ABNORMAL HIGH (ref 10.0–149.0)
VLDL: 49.8 mg/dL — ABNORMAL HIGH (ref 0.0–40.0)

## 2024-06-10 LAB — MAGNESIUM: Magnesium: 2.1 mg/dL (ref 1.5–2.5)

## 2024-06-10 LAB — PHOSPHORUS: Phosphorus: 3.4 mg/dL (ref 2.3–4.6)

## 2024-06-10 LAB — COMPREHENSIVE METABOLIC PANEL WITH GFR
ALT: 36 U/L (ref 3–53)
AST: 42 U/L — ABNORMAL HIGH (ref 5–37)
Albumin: 4.6 g/dL (ref 3.5–5.2)
Alkaline Phosphatase: 54 U/L (ref 39–117)
BUN: 21 mg/dL (ref 6–23)
CO2: 30 meq/L (ref 19–32)
Calcium: 9.9 mg/dL (ref 8.4–10.5)
Chloride: 101 meq/L (ref 96–112)
Creatinine, Ser: 1.11 mg/dL (ref 0.40–1.50)
GFR: 65.95 mL/min
Glucose, Bld: 107 mg/dL — ABNORMAL HIGH (ref 70–99)
Potassium: 4.3 meq/L (ref 3.5–5.1)
Sodium: 139 meq/L (ref 135–145)
Total Bilirubin: 0.4 mg/dL (ref 0.2–1.2)
Total Protein: 7.1 g/dL (ref 6.0–8.3)

## 2024-06-10 LAB — MICROALBUMIN / CREATININE URINE RATIO
Creatinine,U: 112.7 mg/dL
Microalb Creat Ratio: 8.7 mg/g (ref 0.0–30.0)
Microalb, Ur: 1 mg/dL (ref 0.7–1.9)

## 2024-06-10 LAB — PSA: PSA: 0.64 ng/mL (ref 0.10–4.00)

## 2024-06-10 LAB — HEMOGLOBIN A1C: Hgb A1c MFr Bld: 6.5 % (ref 4.6–6.5)

## 2024-06-10 LAB — VITAMIN B12: Vitamin B-12: 494 pg/mL (ref 211–911)

## 2024-06-10 LAB — VITAMIN D 25 HYDROXY (VIT D DEFICIENCY, FRACTURES): VITD: 107.93 ng/mL (ref 30.00–100.00)

## 2024-06-10 LAB — TSH: TSH: 2.46 u[IU]/mL (ref 0.35–5.50)

## 2024-06-10 NOTE — Telephone Encounter (Signed)
 STOP vitamin D  supplementation. Will recheck at next visit.

## 2024-06-10 NOTE — Patient Instructions (Addendum)
 We have completed your physical today.   Continue current medication regimen.   Follow up with specialists as scheduled.   We are checking labs today, will be in contact with any results that require further attention.  Follow-up with me in 6 mos for medication management, sooner if needed.

## 2024-06-10 NOTE — Telephone Encounter (Signed)
 CRITICAL VALUE STICKER  CRITICAL VALUE: Vit D 107.93  RECEIVER (on-site recipient of call): Ulyana Pitones  DATE & TIME NOTIFIED: 223pm 1/15  MESSENGER (representative from lab): Saa  MD NOTIFIED: PCP notified  TIME OF NOTIFICATION: 224pm  RESPONSE:  PCP notified, attempted to reach patient LVM.

## 2024-06-10 NOTE — Progress Notes (Signed)
 "  Annual Physical Exam  Subjective:     Patient ID: [name] [name], male    DOB: 11/10/1950, 74 y.o.   MRN: [id number]  No chief complaint on file.   HPI  Discussed the use of AI scribe software for clinical note transcription with the patient, who gave verbal consent to proceed.  History of Present Illness [name] [name] is a 74 year old male who presents with muscle cramps and sleep disturbances.  Muscle cramps - Frequent muscle cramps, especially nocturnal - Cramps occur during platelet donations, improve with antacids or calcium  - Lifetime history of similar cramps - Takes 500 mg magnesium nightly with partial relief  Sleep disturbances - Difficulty initiating sleep despite fatigue - Frequently wakes shortly after going to bed - Occasionally takes acetaminophen  for sleep, now less effective - Stays up watching TV and sleeps later in the mornings  Restless legs sensation - Uncomfortable sensation in legs with urge to move them, consistent with restless legs  Lower extremity orthotics and compression - Recently fitted for new brace for drop foot, improves ambulation, awaiting insurance approval - Uses compression socks during daytime when walking or standing extensively - Wears booties at night to keep feet warm; booties leave marks on legs but no perceived swelling     ROS Per HPI  Most recent fall risk assessment:    03/11/2024   10:21 AM  Fall Risk   Falls in the past year? 1  Number falls in past yr: 1  Injury with Fall? 0   Risk for fall due to : Impaired balance/gait     Data saved with a previous flowsheet row definition    Most recent depression screenings:    06/10/2024    9:13 AM 03/11/2024   10:21 AM  PHQ 2/9 Scores  PHQ - 2 Score 2 2  PHQ- 9 Score 4 4      Data saved with a previous flowsheet row definition    Vision:Within last year, Dental: No current dental problems and Receives regular dental care, and PSA: Agrees to PSA  testing  Patient Care Team: Alvia Corean CROME, FNP as PCP - General (Family Medicine) Abran Norleen SAILOR, MD as Consulting Physician (Gastroenterology) Quinn Odor, [facility] (Inactive) as Pharmacist (Pharmacist) Abigail, Maude P[facility])   Show/hide medication list[1]     Objective:    BP 126/78   Pulse (!) 56   Temp 97.8 F (36.6 C) (Temporal)   Ht 5' 7.5 (1.715 m)   Wt 215 lb 3.2 oz (97.6 kg)   SpO2 96%   BMI 33.21 kg/m    Physical Exam Vitals and nursing note reviewed.  Constitutional:      General: He is not in acute distress.    Appearance: Normal appearance. He is obese.  HENT:     Head: Normocephalic and atraumatic.     Right Ear: External ear normal.     Left Ear: External ear normal.     Nose: Nose normal.     Mouth/Throat:     Mouth: Mucous membranes are moist.     Pharynx: Oropharynx is clear.  Eyes:     Extraocular Movements: Extraocular movements intact.  Cardiovascular:     Rate and Rhythm: Normal rate and regular rhythm.     Pulses: Normal pulses.     Heart sounds: Normal heart sounds.  Pulmonary:     Effort: Pulmonary effort is normal. No respiratory distress.     Breath sounds: Normal breath sounds. No  wheezing, rhonchi or rales.  Musculoskeletal:        General: Normal range of motion.     Cervical back: Normal range of motion.     Right lower leg: No edema.     Left lower leg: No edema.  Lymphadenopathy:     Cervical: No cervical adenopathy.  Skin:    General: Skin is warm and dry.  Neurological:     General: No focal deficit present.     Mental Status: He is alert and oriented to person, place, and time.  Psychiatric:        Mood and Affect: Mood normal.        Behavior: Behavior normal.     Results for orders placed or performed in visit on 06/10/24  CBC with Differential/Platelet  Result Value Ref Range   WBC 3.7 (L) 4.0 - 10.5 K/uL   RBC 4.71 4.22 - 5.81 Mil/uL   Hemoglobin 12.1 (L) 13.0 - 17.0 g/dL   HCT 62.8 (L) 60.9 - 47.9  %   MCV 78.8 78.0 - 100.0 fl   MCHC 32.7 30.0 - 36.0 g/dL   RDW 83.5 (H) 88.4 - 84.4 %   Platelets 243.0 150.0 - 400.0 K/uL   Neutrophils Relative % 44.6 43.0 - 77.0 %   Lymphocytes Relative 31.5 12.0 - 46.0 %   Monocytes Relative 17.1 (H) 3.0 - 12.0 %   Eosinophils Relative 5.4 (H) 0.0 - 5.0 %   Basophils Relative 1.4 0.0 - 3.0 %   Neutro Abs 1.7 1.4 - 7.7 K/uL   Lymphs Abs 1.2 0.7 - 4.0 K/uL   Monocytes Absolute 0.6 0.1 - 1.0 K/uL   Eosinophils Absolute 0.2 0.0 - 0.7 K/uL   Basophils Absolute 0.1 0.0 - 0.1 K/uL  Comprehensive metabolic panel with GFR  Result Value Ref Range   Sodium 139 135 - 145 mEq/L   Potassium 4.3 3.5 - 5.1 mEq/L   Chloride 101 96 - 112 mEq/L   CO2 30 19 - 32 mEq/L   Glucose, Bld 107 (H) 70 - 99 mg/dL   BUN 21 6 - 23 mg/dL   Creatinine, Ser 8.88 0.40 - 1.50 mg/dL   Total Bilirubin 0.4 0.2 - 1.2 mg/dL   Alkaline Phosphatase 54 39 - 117 U/L   AST 42 (H) 5 - 37 U/L   ALT 36 3 - 53 U/L   Total Protein 7.1 6.0 - 8.3 g/dL   Albumin 4.6 3.5 - 5.2 g/dL   GFR 34.04 >39.99 mL/min   Calcium  9.9 8.4 - 10.5 mg/dL  Hemoglobin J8r  Result Value Ref Range   Hgb A1c MFr Bld 6.5 4.6 - 6.5 %  Lipid panel  Result Value Ref Range   Cholesterol 164 28 - 200 mg/dL   Triglycerides 750.9 (H) 10.0 - 149.0 mg/dL   HDL 69.79 (L) >60.99 mg/dL   VLDL 50.1 (H) 0.0 - 59.9 mg/dL   LDL Cholesterol 84 10 - 99 mg/dL   Total CHOL/HDL Ratio 5    NonHDL 134.27   Microalbumin / creatinine urine ratio  Result Value Ref Range   Microalb, Ur 1.0 0.7 - 1.9 mg/dL   Creatinine,U 887.2 mg/dL   Microalb Creat Ratio 8.7 0.0 - 30.0 mg/g  PSA  Result Value Ref Range   PSA 0.64 0.10 - 4.00 ng/mL  TSH  Result Value Ref Range   TSH 2.46 0.35 - 5.50 uIU/mL  Vitamin B12  Result Value Ref Range   Vitamin B-12 494 211 -  911 pg/mL  VITAMIN D  25 Hydroxy (Vit-D Deficiency, Fractures)  Result Value Ref Range   VITD 107.93 (HH) 30.00 - 100.00 ng/mL  Magnesium  Result Value Ref Range    Magnesium 2.1 1.5 - 2.5 mg/dL  Phosphorus  Result Value Ref Range   Phosphorus 3.4 2.3 - 4.6 mg/dL  PTH, intact (no Ca)  Result Value Ref Range   PTH 23 16 - 77 pg/mL    BP Readings from Last 3 Encounters:  06/10/24 126/78  05/17/24 124/80  03/16/24 130/84   Wt Readings from Last 3 Encounters:  06/10/24 215 lb 3.2 oz (97.6 kg)  05/17/24 214 lb (97.1 kg)  03/16/24 230 lb (104.3 kg)      Last CBC Lab Results  Component Value Date   WBC 3.7 (L) 06/10/2024   HGB 12.1 (L) 06/10/2024   HCT 37.1 (L) 06/10/2024   MCV 78.8 06/10/2024   MCH 31.3 04/11/2023   RDW 16.4 (H) 06/10/2024   PLT 243.0 06/10/2024   Last metabolic panel Lab Results  Component Value Date   GLUCOSE 107 (H) 06/10/2024   NA 139 06/10/2024   K 4.3 06/10/2024   CL 101 06/10/2024   CO2 30 06/10/2024   BUN 21 06/10/2024   CREATININE 1.11 06/10/2024   GFR 65.95 06/10/2024   CALCIUM  9.9 06/10/2024   PHOS 3.4 06/10/2024   PROT 7.1 06/10/2024   ALBUMIN 4.6 06/10/2024   BILITOT 0.4 06/10/2024   ALKPHOS 54 06/10/2024   AST 42 (H) 06/10/2024   ALT 36 06/10/2024   ANIONGAP 8 10/05/2020   Last lipids Lab Results  Component Value Date   CHOL 164 06/10/2024   HDL 30.20 (L) 06/10/2024   LDLCALC 84 06/10/2024   TRIG 249.0 (H) 06/10/2024   CHOLHDL 5 06/10/2024   Last hemoglobin A1c Lab Results  Component Value Date   HGBA1C 6.5 06/10/2024   Last thyroid  functions Lab Results  Component Value Date   TSH 2.46 06/10/2024   Last vitamin D  Lab Results  Component Value Date   VD25OH 107.93 (HH) 06/10/2024   Last vitamin B12 and Folate Lab Results  Component Value Date   VITAMINB12 494 06/10/2024      DG Elbow Complete Left Result Date: 05/25/2023 CLINICAL DATA:  Medial and posterior left elbow pain. EXAM: LEFT ELBOW - COMPLETE 3+ VIEW COMPARISON:  None available FINDINGS: Mild spurring of the coronoid process. Minimal spurring of the radial head. Irregularity of the cortex of the medial and  lateral humeral epicondyles suggestive of prior episodes of epicondylitis. Mild swelling of the soft tissues overlying the olecranon. IMPRESSION: 1. Mild degenerative changes of the left elbow. 2. Mild soft tissue swelling overlying the olecranon, possibility due to bursitis. Electronically Signed   By: Aliene Lloyd M.D.   On: 05/25/2023 10:37       Assessment & Plan:   Assessment and Plan Assessment & Plan Well Adult Exam Discussed hearing and ear care. No significant concerns. - PSA due today - Up-to-date on other routine screenings and vaccine  Muscle cramps Chronic nocturnal cramps possibly due to magnesium deficiency. Cramps during platelet donation may relate to calcium  levels. - Checked magnesium and phosphorus levels. - Checked parathyroid  hormone levels.  Right hip pain New brace for drop foot aids hip function. Walking nearly normal with brace. - Encouraged continuation of physical therapy exercises at home.  Restless legs syndrome Symptoms managed with acetaminophen , aiding sleep. Not severe enough for additional medication. - Continue acetaminophen  as needed for sleep. -  Monitor symptoms and consider alternative treatments if symptoms worsen.  Essential hypertension - Chronic, controlled, requires ongoing monitoring -CBC, CMP, microalbumin today - Continue metoprolol , olmesartan , hydrochlorothiazide   Obstructive sleep apnea -Chronic, controlled, requires ongoing monitoring -Stable, continue CPAP  Acquired hypothyroidism - Chronic, stable, requires ongoing monitoring -TSH levels today -Continue current levothyroxine  - Asymptomatic today  Myositis - Chronic, stable -Could be related to recent muscle cramps and hip pain - Follow-up with specialist as scheduled  MDD, recurrent, partial remission -Chronic, stable, requires ongoing monitoring - Continue Wellbutrin , Lexapro   Mixed hyperlipidemia - Chronic, stable, requires ongoing monitoring - Lipid levels  today - Continue Zetia , history of statin myopathy and myositis, no statins on board -Continue gemfibrozil   Abnormal glucose - Chronic, stable, requires ongoing monitoring -CMP, A1c today -Continue low sweets and low carb diet, discussed increasing activity level to help stabilize glucose  Prostate cancer screening - PSA levels today  Vitamin D  deficiency - Chronic, stable, requires ongoing monitoring -Vitamin D  levels today - Continue current vitamin D  supplementation       Health Maintenance  Topic Date Due   COVID-19 Vaccine (9 - Pfizer risk 2025-26 season) 09/17/2024   Medicare Annual Wellness Visit  11/24/2024   Colon Cancer Screening  05/23/2027   DTaP/Tdap/Td vaccine (3 - Td or Tdap) 04/06/2029   Pneumococcal Vaccine for age over 70  Completed   Flu Shot  Completed   Hepatitis C Screening  Completed   Zoster (Shingles) Vaccine  Completed   Meningitis B Vaccine  Aged Out     Discussed health benefits of physical activity, and encouraged him to engage in regular exercise appropriate for his age and condition.  Orders Placed This Encounter  Procedures   CBC with Differential/Platelet    Release to patient:   Immediate [1]   Comprehensive metabolic panel with GFR    Release to patient:   Immediate [1]   Hemoglobin A1c   Lipid panel   Microalbumin / creatinine urine ratio    Release to patient:   Immediate   PSA   TSH   Vitamin B12   VITAMIN D  25 Hydroxy (Vit-D Deficiency, Fractures)   Magnesium   Phosphorus   PTH, intact (no Ca)     No orders of the defined types were placed in this encounter.   Return in about 6 months (around 12/08/2024) for Meds OV.  Corean LITTIE Ku, FNP     [1]  Outpatient Medications Prior to Visit  Medication Sig   AMBULATORY NON FORMULARY MEDICATION Left AFO Dispense 1 Dx code: M 21.372 Use as needed   Ascorbic Acid  (VITAMIN C ) 1000 MG tablet Take 1,000 mg by mouth daily.   aspirin  EC 81 MG tablet Take 81 mg by  mouth daily. Swallow whole.   B Complex Vitamins (B COMPLEX PO) Take 1 tablet by mouth 3 (three) times a week. Takes M,W,F in the morning   buPROPion  (WELLBUTRIN  XL) 150 MG 24 hr tablet TAKE 1 TABLET EVERY MORNING FOR MOOD, FOCUS, AND CONCENTRATION   Cetirizine HCl (KLS ALLER-TEC PO) Take by mouth.   Cholecalciferol (VITAMIN D ) 50 MCG (2000 UT) tablet Take 6,000 Units by mouth at bedtime.   EPINEPHrine  (EPIPEN  2-PAK) 0.3 mg/0.3 mL IJ SOAJ injection Inject 0.3 mLs (0.3 mg total) into the muscle once.   escitalopram  (LEXAPRO ) 20 MG tablet Take 1 tablet (20 mg total) by mouth daily.   Esomeprazole Magnesium 20 MG TBEC Take 1 tablet by mouth daily. At HS   ezetimibe  (ZETIA )  10 MG tablet Take 1 tablet (10 mg total) by mouth daily for cholesterol   Flaxseed, Linseed, (FLAX SEED OIL) 1000 MG CAPS Take 1,000 mg by mouth in the morning and at bedtime.   gemfibrozil  (LOPID ) 600 MG tablet Take 1 tablet (600 mg total) by mouth 2 (two) times daily for cholesterol and triglycerides.   hydrochlorothiazide  (HYDRODIURIL ) 25 MG tablet Take 1 tablet (25 mg total) by mouth in the morning for blood pressure goal <130/80.   LACTOBACILLUS PROBIOTIC PO Take 1 capsule by mouth in the morning.   levothyroxine  (SYNTHROID ) 50 MCG tablet Take 1 tablet (50 mcg total) by mouth daily ON AN EMPTY STOMACH WITH ONLY WATER FOR 30 MINUTES AND NO ANTACID MEDICATIONS, CALCIUM  OR MAGNESIUM FOR 4 HOURS AND AVOID BIOTIN   MAGNESIUM PO Take 750 mg by mouth daily.    metoprolol  tartrate (LOPRESSOR ) 25 MG tablet TAKE 1 TABLET TWICE A DAY  FOR BLOOD PRESSURE (EVERY  12 HOURS)   Multiple Vitamin (MULTIVITAMIN) capsule Take by mouth.   olmesartan  (BENICAR ) 40 MG tablet Take 1 tablet (40 mg total) by mouth daily for blood pressure.   triamcinolone  cream (KENALOG ) 0.1 % Apply 1 Application topically 2 (two) times daily.   No facility-administered medications prior to visit.   "

## 2024-06-10 NOTE — Telephone Encounter (Signed)
 Patient notified.

## 2024-06-11 LAB — PARATHYROID HORMONE, INTACT (NO CA): PTH: 23 pg/mL (ref 16–77)

## 2024-06-11 NOTE — Therapy (Signed)
 " OUTPATIENT PHYSICAL THERAPY LOWER EXTREMITY TREATMENT   Patient Name: [name] [name] MRN: [id number] DOB:May 21, 1951, 74 y.o., male Today's Date: 06/14/2024  END OF SESSION:  PT End of Session - 06/14/24 0929     Visit Number 9    Date for Recertification  07/09/24    Authorization Type HTA    PT Start Time 0930    PT Stop Time 1015    PT Time Calculation (min) 45 min    Activity Tolerance Patient tolerated treatment well    Behavior During Therapy [facility] for tasks assessed/performed          Past Medical History:  Diagnosis Date   Cardiomegaly    Colon polyp    Depression    Fatty liver disease, nonalcoholic    Gallstones    Hypertension    Hypogonadism male    Mixed hyperlipidemia    Obesity    Pancreas (digestive gland) works poorly    Prediabetes    Sleep apnea    Testosterone  deficiency 08/03/2009   Thyroid  disease    Vitamin D  deficiency    Past Surgical History:  Procedure Laterality Date   ANKLE FRACTURE SURGERY Left 1986   APPENDECTOMY  1964   CARPAL TUNNEL RELEASE Bilateral 1996   CHOLECYSTECTOMY  1976   COLONOSCOPY     POLYPECTOMY     TIBIA IM NAIL INSERTION Right 03/21/2016   Procedure: INTRAMEDULLARY (IM) NAIL TIBIAL;  Surgeon: Evalene JONETTA Chancy, MD;  Location: [facility] OR;  Service: Orthopedics;  Laterality: Right;   Patient Active Problem List   Diagnosis Date Noted   Chronic pain of both shoulders 03/11/2024   Right hip pain 03/11/2024   At increased risk of exposure to COVID-19 virus 09/09/2023   Recurrent dry cough 08/29/2021   Multiple fractures of ribs, left side, initial encounter for closed fracture 10/04/2020   Fatty liver disease, nonalcoholic 07/15/2018   Abnormal glucose 07/13/2018   Erectile dysfunction 09/12/2014   MDD (major depressive disorder), recurrent, in partial remission 09/12/2014   Myositis 12/31/2013   Medication management 12/31/2013   Hypothyroidism 12/31/2013   Left foot drop 08/30/2013   Vitamin D  deficiency     Hyperlipidemia 08/04/2009   Morbid obesity (HCC) - BMI 30+ with OSA 08/03/2009   Essential hypertension 08/01/2009   Obstructive sleep apnea 08/01/2009    PCP: CANDIE Ku, NP  REFERRING PROVIDER: Joane, MD  REFERRING DIAG: hip pain, shoulder pain and LBP  THERAPY DIAG:  Chronic pain of both shoulders  Pain in right hip  Other low back pain  Rationale for Evaluation and Treatment: Rehabilitation  ONSET DATE: 03/17/24  SUBJECTIVE:   SUBJECTIVE STATEMENT: I am doing good, no pain. He reports falling over the weekend while at a basketball game. He was stepping down from the bleachers on to his LLE and it gave out on him. He fell backwards into a bigger man who was able to support him and prevent falling on floor.    Reports falls years ago that caused shoulder issues, has arthritis and RC tearing, degenerative changes in the hip and back.  REports that he has put up with a lot and ignored a lot, reports that recently he has had a lot of difficulty walking, with stairs, reaching and lifting, standing.  PERTINENT HISTORY: Depression, HTN, sleep apnea, ankle surgery, CT release, IM nail right tibia PAIN:  Are you having pain? Yes: NPRS scale: 4/10  Pain location: low back Pain description: dull ache Aggravating factors: walking, standing stairs,  reaching, lifting pain up to 9/10 Relieving factors: rest, inactivity, OTC pain meds 0/10  PRECAUTIONS: Fall  RED FLAGS: None   WEIGHT BEARING RESTRICTIONS: No  FALLS:  Has patient fallen in last 6 months? Yes. Number of falls 2  LIVING ENVIRONMENT: Lives with: lives with their family Lives in: House/apartment Stairs: 3 into the home with handrail Has following equipment at home: Single point cane  OCCUPATION: retired  PLOF: Independent and some house work, some yard work  PATIENT GOALS: better balance, less pain, tolerate activity  NEXT MD VISIT: unsure  OBJECTIVE:  Note: Objective measures were completed at  Evaluation unless otherwise noted.  DIAGNOSTIC FINDINGS:  IMPRESSION: 1. No fracture or dislocation of the shoulders. 2. High riding position of the glenohumeral joints, right-greater-than-left, with mild subacromial pseudo arthrosis on the right. Findings are consistent with chronic rotator cuff tear. 3. Moderate bilateral acromioclavicular arthrosis and mild bilateral glenohumeral arthrosis.  IMPRESSION: 1. Minimal right hip degenerative changes. 2. Lower lumbar spine degenerative changes.  COGNITION: Overall cognitive status: Within functional limits for tasks assessed     SENSATION: WFL  MUSCLE LENGTH: Very tight HS and piriformis  POSTURE: rounded shoulders, forward head, and decreased lumbar lordosis  PALPATION: Mild tenderness in the right lateral hip, the low back and the shoulders  LOWER EXTREMITY ROM:  Active ROM Right eval Left eval  Hip flexion 60   Hip extension    Hip abduction 10   Hip adduction    Hip internal rotation    Hip external rotation    Knee flexion    Knee extension    Ankle dorsiflexion    Ankle plantarflexion    Ankle inversion    Ankle eversion     (Blank rows = not tested)  LOWER EXTREMITY MMT:  MMT Right eval Left eval  Hip flexion 4- 4  Hip extension    Hip abduction 4- 4  Hip adduction    Hip internal rotation    Hip external rotation    Knee flexion    Knee extension    Ankle dorsiflexion 4- 0  Ankle plantarflexion  3+  Ankle inversion  3+  Ankle eversion  3+   (Blank rows = not tested)  Cannot stand on the right leg due to weakness SHOULDER AROM:   Flexion R/L 140, abduction 120 bilaterally, ER 40, IR 40 some pain with all  SHOULDER MMT:  4-/5 ER/IR, FLexion/abduction 3+/5   Shoulder tests:  pain with empty can, weak  LUMBAR ROM:   decreased 50% with all motions with c/o tightness  FUNCTIONAL TESTS:  5 times sit to stand: 26 seconds Timed up and go (TUG): 20 seconds BERG: 36/56    GAIT: Distance  walked: 60 feet Assistive device utilized: has a cane and an AFO for the left ankle and None Level of assistance: SBA Comments: left drop foot, with exagerrated left hip flexion and knee flexion to clear foot  TREATMENT DATE:  06/14/24 NuStep L5x34mins  SLS and tandem in bars  -SL <5s BL -tandem ~10s  Walking on beam On airex cone taps Step ups 6 forward and sideways- minA  Leg ext 10# 2x12 HS curls 25# 2x12 Calf raises on bar 2x10   06/07/24 NuStep L5 x  BERG reassessment  Step Ups form aires 6 2x10 Lateral side steps airex Standing hip abduction 2x10 2.5#  STS w/ OHP yellow weighted ball Lumbar roatation  SB Rollouts   05/31/24 NuStep L5x33mins Leg ext 10# 2x10 HS curls 45# 2x10 Resisted gait 30# 4 way x4  STS on airex 2x10 Step ups 6 Walking on beam  SLS and tandem on beam  Cone taps on airex    05/19/24 NuStep L5 x  Reassess BERG Step Ups 2x10 4 STS on airex 2x10  Lateral band walking Rtband  Glute Bridges 2x10 SLR 2x10  Lateral Step Ups 4 2x10   05/11/24 Nustep level 4 x 6 minutes HS curls 25# UBE level 3 x 4 minutes 5# leg ext 5# straight arm pulls 15# rows Demo of resisted hip extension and abduction 20# lats Feet on ball K2C, rotation, small bridge, isometric abs Passive stretch LE's  05/04/24 NuStep L 5 x 6 min TUG 13.78 sec HS curls 35lb 2x10 Leg Ext 10lb 2x10 6in step ups x10 each 6in lateal step ups x10 each 1 rail Leg press 40lb 3x10 Shoulder Ext 10lb 2x10 standing on airex Sit to stand 2x10  04/27/24 NuStep L 5 x 6 min 20lb resisted side steps x5 each  6in step ups some UE assist x10 each HS curls 25lb 2x15 Leg Ext 10lb 2x15 Sit to stand 2x10  Does extend LE prematurely  Shoulder Ext 10lb 2x10  04/12/24 NuStep L5x50min 3 Way hip (flex/abd/ext) red resistance band  Hamstring Curls 25#  2x10 Leg ext 15# 2x10 Tricep Pulldowns 20# 2x10 Seated Row 30# 2x10 Lateral Raises 3# 2x10 STS with overhead press  04/08/24 Evaluation, HEP, POC    PATIENT EDUCATION:  Education details: HEP/POC Person educated: Patient Education method: Programmer, Multimedia, Facilities Manager, Verbal cues, and Handouts Education comprehension: verbalized understanding  HOME EXERCISE PROGRAM: Access Code: [email or web address] URL: [email or web address] Date: 04/08/2024 Prepared by: Ozell Mainland  Exercises - Standing Tandem Balance with Counter Support  - 1 x daily - 7 x weekly - 1 sets - 10 reps - 10 hold - Standing Hip Abduction with Counter Support  - 2 x daily - 7 x weekly - 1 sets - 10 reps - 3 hold - Standing March with Counter Support  - 2 x daily - 7 x weekly - 1 sets - 10 reps - 3 hold - Standing Hip Extension with Counter Support  - 2 x daily - 7 x weekly - 1 sets - 10 reps - 3 hold - Heel Raises with Counter Support  - 2 x daily - 7 x weekly - 1 sets - 10 reps - 3 hold  ASSESSMENT:  CLINICAL IMPRESSION: Patient is doing well overall, reports no pain today. He did have a fall over the weekend but did not hit the ground. He has ongoing difficulty with balance, especially static activities like SL and tandem stance. Pt had a few instances of losing balance with step ups, requiring assistance to prevent falling. Pt will benefit from PT to focus on balance in order to increase gait quality and promote fall prevention strategies.    Patient is a 74 y.o. male who was seen today for  physical therapy evaluation and treatment for LBP, hip pain and shoulder pain.  Biggest issue is poor balance, with the testing above he is at a very high risk for falls.   Has a drop foot on the left.    OBJECTIVE IMPAIRMENTS: Abnormal gait, cardiopulmonary status limiting activity, decreased activity tolerance, decreased balance, decreased coordination, decreased endurance, decreased mobility, difficulty walking,  decreased ROM, decreased strength, increased fascial restrictions, increased muscle spasms, impaired flexibility, improper body mechanics, postural dysfunction, and pain.   REHAB POTENTIAL: Good  CLINICAL DECISION MAKING: Evolving/moderate complexity  EVALUATION COMPLEXITY: Moderate   GOALS: Goals reviewed with patient? Yes  SHORT TERM GOALS: Target date: 04/29/24 Independent with initial HEP Baseline: Goal status: ONGOING Performing 50% 05/19/24, MET 05/31/24  LONG TERM GOALS: Target date: 07/09/24  Independent with advanced HEP Baseline:  Goal status: IN PROGRESS 06/07/24  2.  Understand posture and body mechanics Baseline:  Goal status: IN PROGRESS 06/07/24  3.  Improve TUG to 14 seconds Baseline: 20 seconds Goal status: Met 05/04/24  4.  Improve Berg balance to 45/56 Baseline: 36/56 Goal status: IN PROGRESS 41/56 05/19/24; MET 45/56 06/07/24  5.  Decrease pain with shopping 50% Baseline: pain up to 8-9/10 with shopping at [employer] Goal status: still unable to shop for prolonged time w/o pain increase 04/12/24, 75% if he carries a cane 05/04/24, MET 05/31/24   PLAN:  PT FREQUENCY: 1-2x/week  PT DURATION: 12 weeks  PLANNED INTERVENTIONS: 97164- PT Re-evaluation, 97110-Therapeutic exercises, 97530- Therapeutic activity, 97112- Neuromuscular re-education, 97535- Self Care, [id number]- Manual therapy, [id number]- Gait training, [phone]- Splinting, [id number]- Electrical stimulation (unattended), 97016- Vasopneumatic device, [id number]- Ultrasound, [id number]- Traction (mechanical), [id number]- Ionotophoresis 4mg /ml Dexamethasone , Patient/Family education, Balance training, Stair training, Taping, Joint mobilization, Cryotherapy, and Moist heat  PLAN FOR NEXT SESSION: 10th visit progress note, work on haematologist with AFO if he has it    [employer], PT 06/14/2024, 10:13 AM  "

## 2024-06-13 ENCOUNTER — Other Ambulatory Visit (HOSPITAL_COMMUNITY): Payer: Self-pay

## 2024-06-14 ENCOUNTER — Other Ambulatory Visit: Payer: Self-pay

## 2024-06-14 ENCOUNTER — Ambulatory Visit

## 2024-06-14 DIAGNOSIS — M25511 Pain in right shoulder: Secondary | ICD-10-CM | POA: Diagnosis not present

## 2024-06-14 DIAGNOSIS — G8929 Other chronic pain: Secondary | ICD-10-CM

## 2024-06-14 DIAGNOSIS — M5459 Other low back pain: Secondary | ICD-10-CM

## 2024-06-14 DIAGNOSIS — M25551 Pain in right hip: Secondary | ICD-10-CM

## 2024-06-20 ENCOUNTER — Other Ambulatory Visit (HOSPITAL_COMMUNITY): Payer: Self-pay

## 2024-06-20 ENCOUNTER — Other Ambulatory Visit: Payer: Self-pay

## 2024-06-21 ENCOUNTER — Ambulatory Visit

## 2024-06-23 ENCOUNTER — Ambulatory Visit: Payer: Self-pay | Admitting: Family Medicine

## 2024-06-28 ENCOUNTER — Ambulatory Visit

## 2024-06-29 ENCOUNTER — Ambulatory Visit

## 2024-06-29 DIAGNOSIS — M5459 Other low back pain: Secondary | ICD-10-CM

## 2024-06-29 DIAGNOSIS — M25551 Pain in right hip: Secondary | ICD-10-CM

## 2024-06-29 DIAGNOSIS — G8929 Other chronic pain: Secondary | ICD-10-CM

## 2024-07-05 ENCOUNTER — Ambulatory Visit

## 2024-07-12 ENCOUNTER — Ambulatory Visit

## 2024-07-19 ENCOUNTER — Ambulatory Visit

## 2024-07-26 ENCOUNTER — Ambulatory Visit

## 2024-11-02 ENCOUNTER — Encounter: Admitting: Adult Health

## 2024-11-25 ENCOUNTER — Ambulatory Visit

## 2024-12-09 ENCOUNTER — Ambulatory Visit: Admitting: Family Medicine
